# Patient Record
Sex: Female | Born: 1965
Health system: Southern US, Community
[De-identification: ages and names within clinical notes are randomized; demographics above are authoritative.]

## PROBLEM LIST (undated history)

## (undated) DIAGNOSIS — I428 Other cardiomyopathies: Secondary | ICD-10-CM

## (undated) DIAGNOSIS — E785 Hyperlipidemia, unspecified: Secondary | ICD-10-CM

## (undated) DIAGNOSIS — E1142 Type 2 diabetes mellitus with diabetic polyneuropathy: Secondary | ICD-10-CM

## (undated) DIAGNOSIS — I509 Heart failure, unspecified: Secondary | ICD-10-CM

## (undated) DIAGNOSIS — R011 Cardiac murmur, unspecified: Secondary | ICD-10-CM

## (undated) DIAGNOSIS — E119 Type 2 diabetes mellitus without complications: Secondary | ICD-10-CM

## (undated) DIAGNOSIS — I5041 Acute combined systolic (congestive) and diastolic (congestive) heart failure: Secondary | ICD-10-CM

## (undated) DIAGNOSIS — H269 Unspecified cataract: Secondary | ICD-10-CM

## (undated) DIAGNOSIS — I10 Essential (primary) hypertension: Secondary | ICD-10-CM

## (undated) HISTORY — DX: Type 2 diabetes mellitus with diabetic polyneuropathy: E11.42

## (undated) HISTORY — PX: WISDOM TOOTH EXTRACTION: SHX21

## (undated) HISTORY — DX: Cardiac murmur, unspecified: R01.1

## (undated) HISTORY — DX: Unspecified cataract: H26.9

## (undated) HISTORY — DX: Hyperlipidemia, unspecified: E78.5

## (undated) HISTORY — PX: PARTIAL HYSTERECTOMY: SHX80

## (undated) HISTORY — PX: UTERINE FIBROID SURGERY: SHX826

## (undated) HISTORY — PX: BREAST BIOPSY: SHX20

## (undated) HISTORY — PX: ABDOMINAL HYSTERECTOMY: SHX81

---

## 2007-09-29 ENCOUNTER — Emergency Department (HOSPITAL_COMMUNITY): Admission: EM | Admit: 2007-09-29 | Discharge: 2007-09-29 | Payer: Self-pay | Admitting: Emergency Medicine

## 2008-01-30 ENCOUNTER — Emergency Department (HOSPITAL_COMMUNITY): Admission: EM | Admit: 2008-01-30 | Discharge: 2008-01-30 | Payer: Self-pay | Admitting: Emergency Medicine

## 2008-02-05 ENCOUNTER — Emergency Department (HOSPITAL_COMMUNITY): Admission: EM | Admit: 2008-02-05 | Discharge: 2008-02-05 | Payer: Self-pay | Admitting: Emergency Medicine

## 2011-02-07 ENCOUNTER — Emergency Department (HOSPITAL_COMMUNITY): Payer: Self-pay

## 2011-02-07 ENCOUNTER — Emergency Department (HOSPITAL_COMMUNITY)
Admission: EM | Admit: 2011-02-07 | Discharge: 2011-02-07 | Disposition: A | Discharge status: Home / Self Care | Payer: Self-pay | Attending: Emergency Medicine | Admitting: Emergency Medicine

## 2011-02-07 DIAGNOSIS — M51379 Other intervertebral disc degeneration, lumbosacral region without mention of lumbar back pain or lower extremity pain: Secondary | ICD-10-CM | POA: Insufficient documentation

## 2011-02-07 DIAGNOSIS — M545 Low back pain, unspecified: Secondary | ICD-10-CM | POA: Insufficient documentation

## 2011-02-07 DIAGNOSIS — M79609 Pain in unspecified limb: Secondary | ICD-10-CM | POA: Insufficient documentation

## 2011-02-07 DIAGNOSIS — M5137 Other intervertebral disc degeneration, lumbosacral region: Secondary | ICD-10-CM | POA: Insufficient documentation

## 2011-02-07 DIAGNOSIS — M5430 Sciatica, unspecified side: Secondary | ICD-10-CM | POA: Insufficient documentation

## 2011-04-18 LAB — URINALYSIS, ROUTINE W REFLEX MICROSCOPIC
Hgb urine dipstick: NEGATIVE
Nitrite: NEGATIVE
Protein, ur: NEGATIVE
Urobilinogen, UA: 0.2

## 2011-04-18 LAB — PREGNANCY, URINE: Preg Test, Ur: NEGATIVE

## 2012-05-26 DIAGNOSIS — Z09 Encounter for follow-up examination after completed treatment for conditions other than malignant neoplasm: Secondary | ICD-10-CM | POA: Insufficient documentation

## 2012-05-27 ENCOUNTER — Emergency Department (HOSPITAL_COMMUNITY)
Admission: EM | Admit: 2012-05-27 | Discharge: 2012-05-27 | Discharge status: Against Medical Advice | Payer: Self-pay | Attending: Emergency Medicine | Admitting: Emergency Medicine

## 2012-08-01 ENCOUNTER — Emergency Department (HOSPITAL_COMMUNITY)
Admission: EM | Admit: 2012-08-01 | Discharge: 2012-08-02 | Disposition: A | Discharge status: Home / Self Care | Payer: Self-pay | Attending: Emergency Medicine | Admitting: Emergency Medicine

## 2012-08-01 ENCOUNTER — Emergency Department (HOSPITAL_COMMUNITY): Payer: Self-pay

## 2012-08-01 ENCOUNTER — Encounter (HOSPITAL_COMMUNITY): Payer: Self-pay | Admitting: *Deleted

## 2012-08-01 DIAGNOSIS — I10 Essential (primary) hypertension: Secondary | ICD-10-CM | POA: Insufficient documentation

## 2012-08-01 DIAGNOSIS — M7989 Other specified soft tissue disorders: Secondary | ICD-10-CM | POA: Insufficient documentation

## 2012-08-01 DIAGNOSIS — Z7982 Long term (current) use of aspirin: Secondary | ICD-10-CM | POA: Insufficient documentation

## 2012-08-01 DIAGNOSIS — R0789 Other chest pain: Secondary | ICD-10-CM | POA: Insufficient documentation

## 2012-08-01 HISTORY — DX: Essential (primary) hypertension: I10

## 2012-08-01 LAB — CBC
Hemoglobin: 12.6 g/dL (ref 12.0–15.0)
MCH: 28.6 pg (ref 26.0–34.0)
MCHC: 33.2 g/dL (ref 30.0–36.0)
RDW: 12.7 % (ref 11.5–15.5)

## 2012-08-01 LAB — COMPREHENSIVE METABOLIC PANEL
BUN: 14 mg/dL (ref 6–23)
Calcium: 9.4 mg/dL (ref 8.4–10.5)
GFR calc Af Amer: >90 mL/min (ref >90)
Glucose, Bld: 88 mg/dL (ref 70–99)
Sodium: 134 mEq/L — ABNORMAL LOW (ref 135–145)
Total Protein: 7.6 g/dL (ref 6.0–8.3)

## 2012-08-01 LAB — D-DIMER, QUANTITATIVE: D-Dimer, Quant: 0.5 ug/mL-FEU — ABNORMAL HIGH (ref 0.00–0.48)

## 2012-08-01 LAB — POCT I-STAT TROPONIN I

## 2012-08-01 MED ORDER — SODIUM CHLORIDE 0.9 % IV SOLN
1000.0000 mL | INTRAVENOUS | Status: DC
  Administered 2012-08-01: 1000 mL via INTRAVENOUS

## 2012-08-01 MED ORDER — ONDANSETRON HCL 4 MG/2ML IJ SOLN
4.0000 mg | Freq: Once | INTRAMUSCULAR | Status: AC
  Administered 2012-08-01: 4 mg via INTRAVENOUS
  Filled 2012-08-01: qty 2

## 2012-08-01 MED ORDER — NAPROXEN 500 MG PO TABS: 500.0000 mg | ORAL_TABLET | Freq: Two times a day (BID) | ORAL | Status: DC

## 2012-08-01 MED ORDER — MORPHINE SULFATE 4 MG/ML IJ SOLN
4.0000 mg | Freq: Once | INTRAMUSCULAR | Status: AC
  Administered 2012-08-01: 4 mg via INTRAVENOUS
  Filled 2012-08-01: qty 1

## 2012-08-01 MED ORDER — HYDROCODONE-ACETAMINOPHEN 5-325 MG PO TABS: 1.0000 | ORAL_TABLET | Freq: Four times a day (QID) | ORAL | Status: DC | PRN

## 2012-08-01 NOTE — ED Notes (Signed)
MD at bedside. 

## 2012-08-01 NOTE — ED Provider Notes (Signed)
History    CSN: [id number] Arrival date & time 08/01/12  1740 First MD Initiated Contact with Patient 08/01/12 2047      Chief Complaint  Patient presents with  . Chest Pain  . Leg Swelling    HPI Comments: It started a couple of nights ago.     Patient is a 47 y.o. female presenting with chest pain. The history is provided by the patient.  Chest Pain The chest pain began 2 days ago. Duration of episode(s) is 5 hours. Chest pain occurs constantly. Progression since onset: waxing and waning. The severity of the pain is moderate. The quality of the pain is described as pressure-like and sharp. The pain does not radiate. Chest pain is worsened by deep breathing. Pertinent negatives for primary symptoms include no fever, no cough, no wheezing, no nausea, no vomiting and no dizziness.  Pertinent negatives for associated symptoms include no near-syncope, no numbness and no weakness. She tried nothing for the symptoms. There are no known risk factors.  Her past medical history is significant for hypertension (but does not need to take medications).    patient states she can exert herself and walk without any discomfort She has also had leg pain but that started about 6-7 months ago.  She got it checked in the ED once but did not investigate it further.  She thinks she has noticed some swelling more recently in both legs.  Her light leg feels like it is burning inside but they both have some pain. Past Medical History  Diagnosis Date  . Hypertension     History reviewed. No pertinent past surgical history.  History reviewed. No pertinent family history.  History  Substance Use Topics  . Smoking status: Not on file  . Smokeless tobacco: Not on file  . Alcohol Use: No    OB History    Grav Para Term Preterm Abortions TAB SAB Ect Mult Living                  Review of Systems  Constitutional: Negative for fever.  Respiratory: Negative for cough and wheezing.   Cardiovascular:  Positive for chest pain. Negative for near-syncope.  Gastrointestinal: Negative for nausea and vomiting.  Neurological: Negative for dizziness, weakness and numbness.  All other systems reviewed and are negative.    Allergies  Review of patient's allergies indicates no known allergies.  Home Medications   Current Outpatient Rx  Name  Route  Sig  Dispense  Refill  . ASPIRIN 81 MG PO TABS   Oral   Take 81 mg by mouth daily.         Marland Kitchen NAPROXEN SODIUM 220 MG PO TABS   Oral   Take 220 mg by mouth as needed. Foot pain           BP 144/85  Pulse 84  Temp 98.3 F (36.8 C) (Oral)  Resp 17  SpO2 100%  Physical Exam  Nursing note and vitals reviewed. Constitutional: She appears well-developed and well-nourished. No distress.  HENT:  Head: Normocephalic and atraumatic.  Right Ear: External ear normal.  Left Ear: External ear normal.  Eyes: Conjunctivae normal are normal. Right eye exhibits no discharge. Left eye exhibits no discharge. No scleral icterus.  Neck: Neck supple. No tracheal deviation present.  Cardiovascular: Normal rate, regular rhythm and intact distal pulses.   Pulmonary/Chest: Effort normal and breath sounds normal. No stridor. No respiratory distress. She has no wheezes. She has no rales.  Abdominal:  Soft. Bowel sounds are normal. She exhibits no distension. There is no tenderness. There is no rebound and no guarding.  Musculoskeletal: She exhibits tenderness. She exhibits no edema.       The patient has mild tenderness in the right ankle but there is no swelling in her calf or foot  Neurological: She is alert. She has normal strength. No sensory deficit. Cranial nerve deficit:  no gross defecits noted. She exhibits normal muscle tone. She displays no seizure activity. Coordination normal.  Skin: Skin is warm and dry. No rash noted.  Psychiatric: She has a normal mood and affect.    ED Course  Procedures (including critical care time) EKG Rate 80  NSR    Nl interval Nl st t waves Labs Reviewed  COMPREHENSIVE METABOLIC PANEL - Abnormal; Notable for the following:    Sodium 134 (*)     All other components within normal limits  D-DIMER, QUANTITATIVE - Abnormal; Notable for the following:    D-Dimer, Quant 0.50 (*)     All other components within normal limits  CBC  POCT I-STAT TROPONIN I   Dg Chest 2 View  08/01/2012  *RADIOLOGY REPORT*  Clinical Data: Chest pain and cough with leg swelling  CHEST - 2 VIEW  Comparison: 02/05/2008.  Findings: Borderline cardiac enlargement.  Clear lung fields.  No effusion or pneumothorax.  Bones unremarkable.  No mediastinal or hilar fullness.  IMPRESSION: Cardiac enlargement.  No active infiltrates.   Original Report Authenticated By: Davonna Belling, M.D.      1. Chest pain, atypical       MDM  Patient is low risk for ACS. Symptoms are atypical for coronary disease. I have low suspicion for ACS. Patient has no risk factors for pulmonary embolism. Her d-dimer is reassuring. I doubt PE.  At this time there does not appear to be any evidence of an acute emergency medical condition and the patient appears stable for discharge with appropriate outpatient follow up.         Celene Kras, MD 08/01/12 [phone]

## 2012-08-01 NOTE — ED Notes (Addendum)
Pt c/o chest pain stabbing in nature that started last night. Reports pain is worse with deep breath and also reports numbess to left hand. Pt also concerned with bilateral leg swelling and pain. Reports frequent visits for same.

## 2012-08-02 MED ORDER — HYDROCODONE-ACETAMINOPHEN 5-325 MG PO TABS
1.0000 | ORAL_TABLET | Freq: Once | ORAL | Status: AC
  Administered 2012-08-02: 1 via ORAL
  Filled 2012-08-02: qty 1

## 2013-02-04 ENCOUNTER — Emergency Department (INDEPENDENT_AMBULATORY_CARE_PROVIDER_SITE_OTHER)
Admission: EM | Admit: 2013-02-04 | Discharge: 2013-02-04 | Disposition: A | Discharge status: Home / Self Care | Payer: Self-pay | Source: Home / Self Care

## 2013-02-04 DIAGNOSIS — K148 Other diseases of tongue: Secondary | ICD-10-CM

## 2013-02-04 DIAGNOSIS — S025XXA Fracture of tooth (traumatic), initial encounter for closed fracture: Secondary | ICD-10-CM

## 2013-02-04 MED ORDER — AMOXICILLIN-POT CLAVULANATE 875-125 MG PO TABS: 1.0000 | ORAL_TABLET | Freq: Two times a day (BID) | ORAL | Status: DC

## 2013-02-04 MED ORDER — TRAMADOL HCL 50 MG PO TABS: 50.0000 mg | ORAL_TABLET | Freq: Four times a day (QID) | ORAL | Status: DC | PRN

## 2013-02-04 NOTE — ED Notes (Signed)
Patient states four days ago, she bit her tongue eating a pashcio and thought that her tongue got infected.  Patient states two days later a knot came up inside her throat.

## 2013-02-04 NOTE — ED Provider Notes (Signed)
Maria Duncan is a 47 y.o. female who presents to Urgent Care today for left-sided tongue irritation and tooth fracture. Patient bit her tongue 4 days ago trying to buy to pistachio. She did hard enough that she broke her Maller on her left side. Since then she's had pain in her tongue where she bit her tongue as well as pain and swelling into her left neck. She denies any fever or trouble swallowing or breathing. She feels well otherwise. She has never done this before.    PMH reviewed. Healthy otherwise History  Substance Use Topics  . Smoking status: Not on file  . Smokeless tobacco: Not on file  . Alcohol Use: No   ROS as above Medications reviewed. No current facility-administered medications for this encounter.   Current Outpatient Prescriptions  Medication Sig Dispense Refill  . amoxicillin-clavulanate (AUGMENTIN) 875-125 MG per tablet Take 1 tablet by mouth 2 (two) times daily.  60 tablet  0  . aspirin 81 MG tablet Take 81 mg by mouth daily.      . naproxen (NAPROSYN) 500 MG tablet Take 1 tablet (500 mg total) by mouth 2 (two) times daily.  30 tablet  0  . traMADol (ULTRAM) 50 MG tablet Take 1 tablet (50 mg total) by mouth every 6 (six) hours as needed for pain.  30 tablet  0    Exam:  BP 150/95  Pulse 81  Temp(Src) 99.1 F (37.3 C) (Oral)  Resp 18  SpO2 97% Gen: Well NAD HEENT: EOMI,  MMM. Broken molar left mandible. Ulcer on left tongue.  Mild tender anterior cervical lymphadenopathy on the left side.  No abscess present.    No results found for this or any previous visit (from the past 24 hour(s)). No results found.  Assessment and Plan: 47 y.o. female with fractured tooth with wound on tongue.  Possible infection.  Plan to treat with Augmentin.  Pain control with tramadol Dental wax for broken tooth Followup with dentist.  Discussed warning signs or symptoms. Please see discharge instructions. Patient expresses understanding.      Rodolph Bong,  MD 02/04/13 2028

## 2013-02-06 LAB — CULTURE, GROUP A STREP

## 2013-03-17 ENCOUNTER — Emergency Department (HOSPITAL_COMMUNITY): Payer: Self-pay

## 2013-03-17 ENCOUNTER — Encounter (HOSPITAL_COMMUNITY): Payer: Self-pay | Admitting: Emergency Medicine

## 2013-03-17 ENCOUNTER — Emergency Department (HOSPITAL_COMMUNITY)
Admission: EM | Admit: 2013-03-17 | Discharge: 2013-03-17 | Disposition: A | Discharge status: Home / Self Care | Payer: Self-pay | Attending: Emergency Medicine | Admitting: Emergency Medicine

## 2013-03-17 DIAGNOSIS — Z7982 Long term (current) use of aspirin: Secondary | ICD-10-CM | POA: Insufficient documentation

## 2013-03-17 DIAGNOSIS — R0602 Shortness of breath: Secondary | ICD-10-CM

## 2013-03-17 DIAGNOSIS — R0789 Other chest pain: Secondary | ICD-10-CM | POA: Insufficient documentation

## 2013-03-17 DIAGNOSIS — R109 Unspecified abdominal pain: Secondary | ICD-10-CM | POA: Insufficient documentation

## 2013-03-17 DIAGNOSIS — G8929 Other chronic pain: Secondary | ICD-10-CM | POA: Insufficient documentation

## 2013-03-17 DIAGNOSIS — I10 Essential (primary) hypertension: Secondary | ICD-10-CM | POA: Insufficient documentation

## 2013-03-17 DIAGNOSIS — M549 Dorsalgia, unspecified: Secondary | ICD-10-CM | POA: Insufficient documentation

## 2013-03-17 DIAGNOSIS — R079 Chest pain, unspecified: Secondary | ICD-10-CM

## 2013-03-17 LAB — URINALYSIS, ROUTINE W REFLEX MICROSCOPIC
Bilirubin Urine: NEGATIVE
Nitrite: NEGATIVE
Protein, ur: NEGATIVE mg/dL
Specific Gravity, Urine: 1.025 (ref 1.005–1.030)
Urobilinogen, UA: 0.2 mg/dL (ref 0.0–1.0)

## 2013-03-17 LAB — POCT I-STAT TROPONIN I: Troponin i, poc: 0 ng/mL (ref 0.00–0.08)

## 2013-03-17 LAB — BASIC METABOLIC PANEL
CO2: 29 mEq/L (ref 19–32)
Glucose, Bld: 93 mg/dL (ref 70–99)
Potassium: 4 mEq/L (ref 3.5–5.1)
Sodium: 138 mEq/L (ref 135–145)

## 2013-03-17 LAB — HEPATIC FUNCTION PANEL
ALT: 11 U/L (ref 0–35)
AST: 16 U/L (ref 0–37)
Alkaline Phosphatase: 80 U/L (ref 39–117)
Bilirubin, Direct: 0.1 mg/dL (ref 0.0–0.3)
Indirect Bilirubin: 0.6 mg/dL (ref 0.3–0.9)

## 2013-03-17 LAB — CBC
Hemoglobin: 11.9 g/dL — ABNORMAL LOW (ref 12.0–15.0)
RBC: 4.22 MIL/uL (ref 3.87–5.11)
WBC: 6.7 10*3/uL (ref 4.0–10.5)

## 2013-03-17 LAB — PRO B NATRIURETIC PEPTIDE: Pro B Natriuretic peptide (BNP): 150.6 pg/mL — ABNORMAL HIGH (ref 0–125)

## 2013-03-17 MED ORDER — MORPHINE SULFATE 4 MG/ML IJ SOLN
4.0000 mg | INTRAMUSCULAR | Status: AC
  Administered 2013-03-17: 4 mg via INTRAVENOUS
  Filled 2013-03-17: qty 1

## 2013-03-17 MED ORDER — IOHEXOL 350 MG/ML SOLN
100.0000 mL | Freq: Once | INTRAVENOUS | Status: AC | PRN
  Administered 2013-03-17: 100 mL via INTRAVENOUS

## 2013-03-17 NOTE — ED Provider Notes (Signed)
CSN: [id number]     Arrival date & time 03/17/13  1257 History     First MD Initiated Contact with Patient 03/17/13 1502     Chief Complaint  Patient presents with  . Chest Pain  . Back Pain  . Shortness of Breath   (Consider location/radiation/quality/duration/timing/severity/associated sxs/prior Treatment) Patient is a 47 y.o. female presenting with chest pain, back pain, and shortness of breath. The history is provided by the patient.  Chest Pain Pain location:  L chest Pain quality: sharp and tightness   Pain radiates to:  Neck and upper back Pain radiates to the back: yes   Pain severity:  Moderate Onset quality:  Gradual Duration:  3 days Timing: intermittent, now constant today. Chronicity:  New Context: breathing and movement   Relieved by:  Nothing Worsened by:  Nothing tried Ineffective treatments:  None tried Associated symptoms: abdominal pain (left sided for 1 year), back pain and shortness of breath   Associated symptoms: no cough, no dizziness, no fatigue, no fever, no headache, no nausea and not vomiting   Back Pain Associated symptoms: abdominal pain (left sided for 1 year) and chest pain   Associated symptoms: no dysuria, no fever and no headaches   Shortness of Breath Associated symptoms: abdominal pain (left sided for 1 year) and chest pain   Associated symptoms: no cough, no fever, no headaches, no neck pain and no vomiting     Past Medical History  Diagnosis Date  . Hypertension    No past surgical history on file. No family history on file. History  Substance Use Topics  . Smoking status: Not on file  . Smokeless tobacco: Not on file  . Alcohol Use: No   OB History   Grav Para Term Preterm Abortions TAB SAB Ect Mult Living                 Review of Systems  Constitutional: Negative for fever and fatigue.  HENT: Negative for congestion, drooling and neck pain.   Eyes: Negative for pain.  Respiratory: Positive for chest tightness and  shortness of breath. Negative for cough.   Cardiovascular: Positive for chest pain.  Gastrointestinal: Positive for abdominal pain (left sided for 1 year). Negative for nausea, vomiting and diarrhea.  Genitourinary: Negative for dysuria and hematuria.  Musculoskeletal: Positive for back pain. Negative for gait problem.  Skin: Negative for color change.  Neurological: Negative for dizziness and headaches.  Hematological: Negative for adenopathy.  Psychiatric/Behavioral: Negative for behavioral problems.  All other systems reviewed and are negative.    Allergies  Hyzaar  Home Medications   Current Outpatient Rx  Name  Route  Sig  Dispense  Refill  . aspirin 81 MG tablet   Oral   Take 81 mg by mouth daily.         . naproxen (NAPROSYN) 500 MG tablet   Oral   Take 1 tablet (500 mg total) by mouth 2 (two) times daily.   30 tablet   0   . traMADol (ULTRAM) 50 MG tablet   Oral   Take 1 tablet (50 mg total) by mouth every 6 (six) hours as needed for pain.   30 tablet   0    BP 136/67  Pulse 79  Temp(Src) 98.5 F (36.9 C) (Oral)  Resp 18  SpO2 100% Physical Exam  Nursing note and vitals reviewed. Constitutional: She is oriented to person, place, and time. She appears well-developed and well-nourished.  HENT:  Head:  Normocephalic.  Mouth/Throat: No oropharyngeal exudate.  Eyes: Conjunctivae and EOM are normal. Pupils are equal, round, and reactive to light.  Neck: Normal range of motion. Neck supple.  Cardiovascular: Normal rate, regular rhythm, normal heart sounds and intact distal pulses.  Exam reveals no gallop and no friction rub.   No murmur heard. Pulmonary/Chest: Effort normal and breath sounds normal. No respiratory distress. She has no wheezes. She exhibits no tenderness.  Abdominal: Soft. Bowel sounds are normal. There is no tenderness. There is no rebound and no guarding.  Musculoskeletal: Normal range of motion. She exhibits no edema and no tenderness.   Normal appearing and symmetrical LE's.   2+ distal pulses.   Neurological: She is alert and oriented to person, place, and time.  Skin: Skin is warm and dry.  Psychiatric: She has a normal mood and affect. Her behavior is normal.    ED Course   Procedures (including critical care time)  Labs Reviewed  CBC - Abnormal; Notable for the following:    Hemoglobin 11.9 (*)    All other components within normal limits  BASIC METABOLIC PANEL - Abnormal; Notable for the following:    GFR calc non Af Amer 81 (*)    All other components within normal limits  PRO B NATRIURETIC PEPTIDE - Abnormal; Notable for the following:    Pro B Natriuretic peptide (BNP) 150.6 (*)    All other components within normal limits  URINALYSIS, ROUTINE W REFLEX MICROSCOPIC - Abnormal; Notable for the following:    APPearance CLOUDY (*)    All other components within normal limits  HEPATIC FUNCTION PANEL - Abnormal; Notable for the following:    Albumin 3.3 (*)    All other components within normal limits  POCT I-STAT TROPONIN I   Dg Chest 2 View  03/17/2013   *RADIOLOGY REPORT*  Clinical Data: Left-sided chest pain.  Current history of hypertension.  CHEST - 2 VIEW  Comparison: CTA chest performed earlier same date.  Two-view chest x-ray 08/01/2012, 02/05/1008.  Findings: Cardiac silhouette upper normal in size, unchanged. Hilar and mediastinal contours otherwise unremarkable.  Lungs clear.  Bronchovascular markings normal.  Pulmonary vascularity normal.  No pneumothorax.  No pleural effusions.  Degenerative changes involving the thoracic spine.  No significant interval change.  IMPRESSION: No acute cardiopulmonary disease.  Stable examination.   Original Report Authenticated By: Hulan Saas, M.D.   Ct Angio Chest Aortic Dissect W &/or W/o  03/17/2013   *RADIOLOGY REPORT*  Clinical Data:  Intermittent chest pain since last night, progressively worsening, radiating to the back and into the pelvis. Shortness of  breath.  CT ANGIOGRAPHY CHEST, ABDOMEN AND PELVIS  Technique:  Multidetector CT imaging through the chest, abdomen and pelvis was performed using the standard protocol during bolus administration of intravenous contrast.  Multiplanar reconstructed images and MIPs were obtained and reviewed to evaluate the vascular anatomy.  Contrast: OMNIPAQUE IOHEXOL 350 MG/ML IV.  Comparison:   None.  CTA CHEST  Findings:  Unenhanced images demonstrates no mural hematoma.  No atherosclerotic calcification involving the thoracic aorta. Enhanced images demonstrate no evidence of thoracic aortic aneurysm or dissection.  Bovine aortic arch anatomy (left common carotid artery arises from the innominate artery).  No evidence of central pulmonary embolism.  Heart size normal.  No visible coronary atherosclerosis.  No pericardial effusion.  Pulmonary parenchyma clear apart from the expected dependent atelectasis posteriorly in the lower lobes.  No confluent airspace consolidation.  No evidence of interstitial lung disease.  No pulmonary parenchymal nodules or masses.  No pleural effusions.  No significant mediastinal, hilar, or axillary lymphadenopathy. Thyroid gland normal in appearance.  Bone window images demonstrate diffuse thoracic spondylosis.  Review of the MIP images confirms the above findings.  IMPRESSION:  1.  No evidence of thoracic aortic aneurysm or dissection. 2.  No acute cardiopulmonary disease.  CTA ABDOMEN AND PELVIS  Findings:  No evidence of abdominal aortic aneurysm or dissection. Calcified plaque in the right common iliac artery and both internal iliac arteries without stenosis.  Widely patent celiac, SMA, and IMA, though the IMA is diminutive.  Widely patent single right renal artery and 2 adjacent left renal arteries.  Normal appearing liver, spleen, pancreas, adrenal glands, kidneys, and gallbladder.  No biliary ductal dilation.  No significant lymphadenopathy.  Elevation of the left hemidiaphragm.  Small  hiatal hernia.  Stomach otherwise unremarkable.  Normal-appearing small bowel.  Moderate stool burden throughout the normal appearing colon.  Normal appearing long appendix in the right mid pelvis, extending to the left of midline.  No ascites.  Small umbilical hernia containing fat.  Urinary bladder decompressed and unremarkable.  Normal-appearing retroverted uterus.  Ovaries normal by CT, containing small cysts. Numerous pelvic phleboliths.  Bone window images demonstrate bilateral L5 pars defects and grade I to II spondylolisthesis of L5 on S1 approximating 10 mm. 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  No new  Review of the MIP images confirms the above findings.  IMPRESSION:  1.  No evidence of abdominal aortic aneurysm or dissection. 2.  Mild iliac atherosclerosis, somewhat advanced for age. 3.  No acute abnormalities involving the abdomen or pelvis. 4.  Small hiatal hernia. 5.  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 6.  Small umbilical hernia containing fat.   Original Report Authenticated By: Hulan Saas, M.D.   Ct Angio Abd/pel W/ And/or W/o  03/17/2013   *RADIOLOGY REPORT*  Clinical Data:  Intermittent chest pain since last night, progressively worsening, radiating to the back and into the pelvis. Shortness of breath.  CT ANGIOGRAPHY CHEST, ABDOMEN AND PELVIS  Technique:  Multidetector CT imaging through the chest, abdomen and pelvis was performed using the standard protocol during bolus administration of intravenous contrast.  Multiplanar reconstructed images and MIPs were obtained and reviewed to evaluate the vascular anatomy.  Contrast: OMNIPAQUE IOHEXOL 350 MG/ML IV.  Comparison:   None.  CTA CHEST  Findings:  Unenhanced images demonstrates no mural hematoma.  No atherosclerotic calcification involving the thoracic aorta. Enhanced images demonstrate no evidence of thoracic aortic aneurysm or dissection.  Bovine  aortic arch anatomy (left common carotid artery arises from the innominate artery).  No evidence of central pulmonary embolism.  Heart size normal.  No visible coronary atherosclerosis.  No pericardial effusion.  Pulmonary parenchyma clear apart from the expected dependent atelectasis posteriorly in the lower lobes.  No confluent airspace consolidation.  No evidence of interstitial lung disease.  No pulmonary parenchymal nodules or masses.  No pleural effusions.  No significant mediastinal, hilar, or axillary lymphadenopathy. Thyroid gland normal in appearance.  Bone window images demonstrate diffuse thoracic spondylosis.  Review of the MIP images confirms the above findings.  IMPRESSION:  1.  No evidence of thoracic aortic aneurysm or dissection. 2.  No acute cardiopulmonary disease.  CTA ABDOMEN AND PELVIS  Findings:  No evidence of abdominal aortic aneurysm or dissection. Calcified plaque in the right common iliac artery and  both internal iliac arteries without stenosis.  Widely patent celiac, SMA, and IMA, though the IMA is diminutive.  Widely patent single right renal artery and 2 adjacent left renal arteries.  Normal appearing liver, spleen, pancreas, adrenal glands, kidneys, and gallbladder.  No biliary ductal dilation.  No significant lymphadenopathy.  Elevation of the left hemidiaphragm.  Small hiatal hernia.  Stomach otherwise unremarkable.  Normal-appearing small bowel.  Moderate stool burden throughout the normal appearing colon.  Normal appearing long appendix in the right mid pelvis, extending to the left of midline.  No ascites.  Small umbilical hernia containing fat.  Urinary bladder decompressed and unremarkable.  Normal-appearing retroverted uterus.  Ovaries normal by CT, containing small cysts. Numerous pelvic phleboliths.  Bone window images demonstrate bilateral L5 pars defects and grade I to II spondylolisthesis of L5 on S1 approximating 10 mm. Degenerative disc disease and spondylosis at  L5-S1.  No new  Review of the MIP images confirms the above findings.  IMPRESSION:  1.  No evidence of abdominal aortic aneurysm or dissection. 2.  Mild iliac atherosclerosis, somewhat advanced for age. 3.  No acute abnormalities involving the abdomen or pelvis. 4.  Small hiatal hernia. 5.  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 6.  Small umbilical hernia containing fat.   Original Report Authenticated By: Hulan Saas, M.D.   1. Chest pain   2. SOB (shortness of breath)      Date: 03/17/2013  Rate: 78  Rhythm: normal sinus rhythm  QRS Axis: normal  Intervals: normal  ST/T Wave abnormalities: normal  Conduction Disutrbances:none  Narrative Interpretation: Mild artifact, No ST or T wave changes consistent with ischemia.   Old EKG Reviewed: unchanged   MDM  3:20 PM 47 y.o. female w hx of chronic left sided abd pain pw chest pain x 3 days, now w/ sob. Pt AFVSS here. Notes pain radiates to back, has had numbness/tingling in bilateral UE's also pain radiating to neck. No bruits heard. Well's negative. Will get labs, ecg, CTA to r/o dissection.   Pt is low risk per heart score. Pain is atypical. Sx have been constant since 7am today w/ neg troponin here. Now cp gone after morphine x 1. CTA CAP neg for AD, no evidence of PE noted although not a PE scan. I offered admission. Pt would like to go home. As pt is feeling better I think it is reasonable to d/c home w/ pcp f/u. Doubt ACS, Doubt PE.   9:13 PM:  I have discussed the diagnosis/risks/treatment options with the patient and believe the pt to be eligible for discharge home to follow-up and establish with a pcp for further evaluation of her chronic abd pain. We also discussed returning to the ED immediately if new or worsening sx occur. We discussed the sx which are most concerning (e.g., continued or worsening cp, sob, fever) that necessitate immediate return. Any new prescriptions  provided to the patient are listed below.  Discharge Medication List as of 03/17/2013  5:52 PM         Randa Spike Mort Sawyers, MD 03/17/13 2115

## 2013-03-17 NOTE — ED Notes (Signed)
Pt states that she started having chest pain on and off since last night but states it is getting worse, radiating to her back, and is SOB.

## 2013-03-17 NOTE — ED Notes (Signed)
Patient transported to X-ray 

## 2014-03-02 ENCOUNTER — Ambulatory Visit (INDEPENDENT_AMBULATORY_CARE_PROVIDER_SITE_OTHER): Payer: Self-pay | Admitting: Emergency Medicine

## 2014-03-02 VITALS — BP 116/80 | HR 87 | Temp 98.4°F | Resp 16 | Ht 64.5 in | Wt 233.5 lb

## 2014-03-02 DIAGNOSIS — R609 Edema, unspecified: Secondary | ICD-10-CM

## 2014-03-02 LAB — POCT UA - MICROSCOPIC ONLY
Bacteria, U Microscopic: NEGATIVE
Casts, Ur, LPF, POC: NEGATIVE
Crystals, Ur, HPF, POC: NEGATIVE
Mucus, UA: NEGATIVE
RBC, URINE, MICROSCOPIC: NEGATIVE
WBC, UR, HPF, POC: NEGATIVE
Yeast, UA: NEGATIVE

## 2014-03-02 LAB — POCT URINALYSIS DIPSTICK
BILIRUBIN UA: NEGATIVE
Blood, UA: NEGATIVE
GLUCOSE UA: NEGATIVE
KETONES UA: NEGATIVE
LEUKOCYTES UA: NEGATIVE
Nitrite, UA: NEGATIVE
PH UA: 7.5
Spec Grav, UA: 1.015
Urobilinogen, UA: 1

## 2014-03-02 LAB — GLUCOSE, POCT (MANUAL RESULT ENTRY): POC GLUCOSE: 75 mg/dL (ref 70–99)

## 2014-03-02 MED ORDER — PSEUDOEPHEDRINE-GUAIFENESIN ER 60-600 MG PO TB12: 1.0000 | ORAL_TABLET | Freq: Two times a day (BID) | ORAL | Status: AC

## 2014-03-02 MED ORDER — TRAMADOL HCL 50 MG PO TABS: 50.0000 mg | ORAL_TABLET | Freq: Four times a day (QID) | ORAL | Status: DC | PRN

## 2014-03-02 MED ORDER — AMOXICILLIN-POT CLAVULANATE 875-125 MG PO TABS: 1.0000 | ORAL_TABLET | Freq: Two times a day (BID) | ORAL | Status: DC

## 2014-03-02 NOTE — Progress Notes (Signed)
[facility] [address], [place] [id number] [phone]  Date:  03/02/2014   Name:  Maria Duncan   DOB:  10/28/1965   MRN:  [id number]  PCP:  No PCP Per Patient    Chief Complaint: URI, Flank Pain and Leg Swelling   History of Present Illness:  Maria Duncan is a 48 y.o. very pleasant female patient who presents with the following:  Multiple complaints.   Has swelling in legs.  Worse when stands up all day.  No pain in legs.  Has green sputum and nasal discharge.  No wheezing or shortness of breath.  No nausea or vomiting.   No fever or chills Right conjunctival drainage and gluing Pain in left flank for months that interferes with laying on that side.  No dysuria, urgency or frequency.   No improvement with over the counter medications or other home remedies.  Denies other complaint or health concern today.   There are no active problems to display for this patient.   Past Medical History  Diagnosis Date  . Hypertension     History reviewed. No pertinent past surgical history.  History  Substance Use Topics  . Smoking status: Never Smoker   . Smokeless tobacco: Never Used  . Alcohol Use: No    Family History  Problem Relation Age of Onset  . Cancer Mother   . Hyperlipidemia Mother   . Diabetes Father   . Hypertension Father   . Hyperlipidemia Sister   . Hypertension Sister   . Hyperlipidemia Brother   . Hypertension Brother   . Cancer Maternal Grandmother   . Stroke Maternal Grandmother   . Cancer Maternal Grandfather   . Diabetes Maternal Grandfather   . Heart disease Maternal Grandfather   . Hypertension Maternal Grandfather   . Diabetes Paternal Grandmother   . Hypertension Paternal Grandmother   . Cancer Paternal Grandfather   . Hypertension Paternal Grandfather   . Stroke Paternal Grandfather     Allergies  Allergen Reactions  . Hyzaar [Losartan Potassium-Hctz] Nausea And Vomiting and Cough    Medication list has been  reviewed and updated.  Current Outpatient Prescriptions on File Prior to Visit  Medication Sig Dispense Refill  . aspirin 81 MG tablet Take 81 mg by mouth daily.      . naproxen (NAPROSYN) 500 MG tablet Take 1 tablet (500 mg total) by mouth 2 (two) times daily.  30 tablet  0  . traMADol (ULTRAM) 50 MG tablet Take 1 tablet (50 mg total) by mouth every 6 (six) hours as needed for pain.  30 tablet  0   No current facility-administered medications on file prior to visit.    Review of Systems:  As per HPI, otherwise negative.    Physical Examination: Filed Vitals:   03/02/14 1506  BP: 116/80  Pulse: 87  Temp: 98.4 F (36.9 C)  Resp: 16   Filed Vitals:   03/02/14 1506  Height: 5' 4.5" (1.638 m)  Weight: 233 lb 8 oz (105.915 kg)   Body mass index is 39.48 kg/(m^2). Ideal Body Weight: Weight in (lb) to have BMI = 25: 147.6  GEN: obese, NAD, Non-toxic, A & O x 3  hoarse HEENT: Atraumatic, Normocephalic. Neck supple. No masses, No LAD.  Right conjunctival injection and drainage Ears and Nose: No external deformity. CV: RRR, No M/G/R. No JVD. No thrill. No extra heart sounds. PULM: CTA B, no wheezes, crackles, rhonchi. No retractions. No resp. distress. No  accessory muscle use. ABD: S, NT, ND, +BS. No rebound. No HSM. EXTR: No c/c less than 1+ edema distal calves NEURO Normal gait.  PSYCH: Normally interactive. Conversant. Not depressed or anxious appearing.  Calm demeanor.    Assessment and Plan: Sinusitis augmentin mucinex d Pink eye tobrex Flank pain Tramadol   Signed,  Phillips Odor, MD   Results for orders placed in visit on 03/02/14  POCT UA - MICROSCOPIC ONLY      Result Value Ref Range   WBC, Ur, HPF, POC neg     RBC, urine, microscopic neg     Bacteria, U Microscopic neg     Mucus, UA neg     Epithelial cells, urine per micros 1-5     Crystals, Ur, HPF, POC neg     Casts, Ur, LPF, POC neg     Yeast, UA neg    POCT URINALYSIS DIPSTICK      Result  Value Ref Range   Color, UA yellow     Clarity, UA clear     Glucose, UA neg     Bilirubin, UA neg     Ketones, UA neg     Spec Grav, UA 1.015     Blood, UA neg     pH, UA 7.5     Protein, UA trace     Urobilinogen, UA 1.0     Nitrite, UA neg     Leukocytes, UA Negative    GLUCOSE, POCT (MANUAL RESULT ENTRY)      Result Value Ref Range   POC Glucose 75  70 - 99 mg/dl

## 2014-03-02 NOTE — Patient Instructions (Signed)

## 2014-03-03 ENCOUNTER — Telehealth: Payer: Self-pay

## 2014-03-03 LAB — COMPREHENSIVE METABOLIC PANEL
ALBUMIN: 4.2 g/dL (ref 3.5–5.2)
ALT: 25 U/L (ref 0–35)
AST: 20 U/L (ref 0–37)
Alkaline Phosphatase: 96 U/L (ref 39–117)
BUN: 10 mg/dL (ref 6–23)
CALCIUM: 9.3 mg/dL (ref 8.4–10.5)
CHLORIDE: 101 meq/L (ref 96–112)
CO2: 30 meq/L (ref 19–32)
Creat: 0.85 mg/dL (ref 0.50–1.10)
Glucose, Bld: 88 mg/dL (ref 70–99)
POTASSIUM: 4.3 meq/L (ref 3.5–5.3)
SODIUM: 138 meq/L (ref 135–145)
TOTAL PROTEIN: 7.3 g/dL (ref 6.0–8.3)
Total Bilirubin: 0.5 mg/dL (ref 0.2–1.2)

## 2014-03-03 NOTE — Telephone Encounter (Signed)
Pt is needing to talk with you about pink eye and needing a different drops   Best number [phone]

## 2014-03-04 NOTE — Telephone Encounter (Signed)
Spoke to female in the home, office number given, states she will return call

## 2014-03-05 ENCOUNTER — Encounter: Payer: Self-pay | Admitting: Emergency Medicine

## 2014-03-05 NOTE — Telephone Encounter (Signed)
Tried to call again, no answer/no VM

## 2014-03-06 NOTE — Telephone Encounter (Signed)
Advised female to have pt RTC if still experiencing symptoms.

## 2016-06-09 ENCOUNTER — Emergency Department (HOSPITAL_BASED_OUTPATIENT_CLINIC_OR_DEPARTMENT_OTHER): Payer: Self-pay

## 2016-06-09 ENCOUNTER — Encounter (HOSPITAL_BASED_OUTPATIENT_CLINIC_OR_DEPARTMENT_OTHER): Payer: Self-pay | Admitting: Emergency Medicine

## 2016-06-09 ENCOUNTER — Emergency Department (HOSPITAL_BASED_OUTPATIENT_CLINIC_OR_DEPARTMENT_OTHER)
Admission: EM | Admit: 2016-06-09 | Discharge: 2016-06-10 | Disposition: A | Discharge status: Home / Self Care | Payer: Self-pay | Attending: Emergency Medicine | Admitting: Emergency Medicine

## 2016-06-09 DIAGNOSIS — Z7982 Long term (current) use of aspirin: Secondary | ICD-10-CM | POA: Insufficient documentation

## 2016-06-09 DIAGNOSIS — I10 Essential (primary) hypertension: Secondary | ICD-10-CM | POA: Insufficient documentation

## 2016-06-09 DIAGNOSIS — M2550 Pain in unspecified joint: Secondary | ICD-10-CM

## 2016-06-09 DIAGNOSIS — M940 Chondrocostal junction syndrome [Tietze]: Secondary | ICD-10-CM | POA: Insufficient documentation

## 2016-06-09 LAB — CBC WITH DIFFERENTIAL/PLATELET
BASOS ABS: 0 10*3/uL (ref 0.0–0.1)
Basophils Relative: 0 %
EOS PCT: 2 %
Eosinophils Absolute: 0.2 10*3/uL (ref 0.0–0.7)
HEMATOCRIT: 38.7 % (ref 36.0–46.0)
Hemoglobin: 12.3 g/dL (ref 12.0–15.0)
LYMPHS PCT: 39 %
Lymphs Abs: 3.1 10*3/uL (ref 0.7–4.0)
MCH: 28 pg (ref 26.0–34.0)
MCHC: 31.8 g/dL (ref 30.0–36.0)
MCV: 88 fL (ref 78.0–100.0)
MONO ABS: 0.7 10*3/uL (ref 0.1–1.0)
MONOS PCT: 8 %
NEUTROS ABS: 4.1 10*3/uL (ref 1.7–7.7)
Neutrophils Relative %: 51 %
PLATELETS: 238 10*3/uL (ref 150–400)
RBC: 4.4 MIL/uL (ref 3.87–5.11)
RDW: 13.1 % (ref 11.5–15.5)
WBC: 8 10*3/uL (ref 4.0–10.5)

## 2016-06-09 LAB — BASIC METABOLIC PANEL
Anion gap: 5 (ref 5–15)
BUN: 14 mg/dL (ref 6–20)
CO2: 32 mmol/L (ref 22–32)
Calcium: 9.6 mg/dL (ref 8.9–10.3)
Chloride: 102 mmol/L (ref 101–111)
Creatinine, Ser: 0.86 mg/dL (ref 0.44–1.00)
GFR calc Af Amer: >60 mL/min (ref >60)
GLUCOSE: 105 mg/dL — AB (ref 65–99)
POTASSIUM: 4.1 mmol/L (ref 3.5–5.1)
Sodium: 139 mmol/L (ref 135–145)

## 2016-06-09 LAB — PREGNANCY, URINE: PREG TEST UR: NEGATIVE

## 2016-06-09 LAB — CBG MONITORING, ED: GLUCOSE-CAPILLARY: 116 mg/dL — AB (ref 65–99)

## 2016-06-09 MED ORDER — IOPAMIDOL (ISOVUE-300) INJECTION 61%
80.0000 mL | Freq: Once | INTRAVENOUS | Status: AC | PRN
  Administered 2016-06-09: 100 mL via INTRAVENOUS

## 2016-06-09 NOTE — ED Triage Notes (Signed)
Patient states that she has a knot to her left upper quadrant area. The patient reports that she has had it for about a year. The patient reports that it is getting bigger and it hurts her back.
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
## 2016-06-10 MED ORDER — NAPROXEN 500 MG PO TABS: 500.0000 mg | ORAL_TABLET | Freq: Two times a day (BID) | ORAL | 0 refills | Status: DC | PRN

## 2016-06-10 NOTE — Discharge Instructions (Signed)
Please use number provided in this discharge paper to find a primary care provider for further management of your chronic aches and pain.  Take naproxen as needed for pain.  Return if you have any concerns.

## 2019-12-07 ENCOUNTER — Emergency Department (HOSPITAL_COMMUNITY): Payer: Self-pay

## 2019-12-07 ENCOUNTER — Encounter (HOSPITAL_COMMUNITY): Payer: Self-pay | Admitting: Emergency Medicine

## 2019-12-07 ENCOUNTER — Other Ambulatory Visit: Payer: Self-pay

## 2019-12-07 ENCOUNTER — Inpatient Hospital Stay (HOSPITAL_COMMUNITY)
Admission: EM | Admit: 2019-12-07 | Discharge: 2019-12-12 | DRG: 287 | Disposition: A | Discharge status: Home Health | Payer: Self-pay | Attending: Cardiovascular Disease | Admitting: Cardiovascular Disease

## 2019-12-07 DIAGNOSIS — Z90710 Acquired absence of both cervix and uterus: Secondary | ICD-10-CM

## 2019-12-07 DIAGNOSIS — M7989 Other specified soft tissue disorders: Secondary | ICD-10-CM

## 2019-12-07 DIAGNOSIS — R7989 Other specified abnormal findings of blood chemistry: Secondary | ICD-10-CM

## 2019-12-07 DIAGNOSIS — I5021 Acute systolic (congestive) heart failure: Secondary | ICD-10-CM

## 2019-12-07 DIAGNOSIS — Z8249 Family history of ischemic heart disease and other diseases of the circulatory system: Secondary | ICD-10-CM

## 2019-12-07 DIAGNOSIS — Z79899 Other long term (current) drug therapy: Secondary | ICD-10-CM

## 2019-12-07 DIAGNOSIS — Y92239 Unspecified place in hospital as the place of occurrence of the external cause: Secondary | ICD-10-CM | POA: Diagnosis not present

## 2019-12-07 DIAGNOSIS — I5041 Acute combined systolic (congestive) and diastolic (congestive) heart failure: Secondary | ICD-10-CM | POA: Diagnosis present

## 2019-12-07 DIAGNOSIS — I517 Cardiomegaly: Secondary | ICD-10-CM | POA: Diagnosis present

## 2019-12-07 DIAGNOSIS — Z20822 Contact with and (suspected) exposure to covid-19: Secondary | ICD-10-CM | POA: Diagnosis present

## 2019-12-07 DIAGNOSIS — I959 Hypotension, unspecified: Secondary | ICD-10-CM | POA: Diagnosis not present

## 2019-12-07 DIAGNOSIS — I509 Heart failure, unspecified: Secondary | ICD-10-CM

## 2019-12-07 DIAGNOSIS — E662 Morbid (severe) obesity with alveolar hypoventilation: Secondary | ICD-10-CM | POA: Diagnosis present

## 2019-12-07 DIAGNOSIS — Z888 Allergy status to other drugs, medicaments and biological substances status: Secondary | ICD-10-CM

## 2019-12-07 DIAGNOSIS — I082 Rheumatic disorders of both aortic and tricuspid valves: Secondary | ICD-10-CM | POA: Diagnosis present

## 2019-12-07 DIAGNOSIS — K5900 Constipation, unspecified: Secondary | ICD-10-CM | POA: Diagnosis not present

## 2019-12-07 DIAGNOSIS — G4733 Obstructive sleep apnea (adult) (pediatric): Secondary | ICD-10-CM | POA: Diagnosis present

## 2019-12-07 DIAGNOSIS — Z823 Family history of stroke: Secondary | ICD-10-CM

## 2019-12-07 DIAGNOSIS — R079 Chest pain, unspecified: Secondary | ICD-10-CM | POA: Diagnosis present

## 2019-12-07 DIAGNOSIS — Z6841 Body Mass Index (BMI) 40.0 and over, adult: Secondary | ICD-10-CM

## 2019-12-07 DIAGNOSIS — T502X5A Adverse effect of carbonic-anhydrase inhibitors, benzothiadiazides and other diuretics, initial encounter: Secondary | ICD-10-CM | POA: Diagnosis not present

## 2019-12-07 DIAGNOSIS — I10 Essential (primary) hypertension: Secondary | ICD-10-CM | POA: Diagnosis present

## 2019-12-07 DIAGNOSIS — I272 Pulmonary hypertension, unspecified: Secondary | ICD-10-CM | POA: Diagnosis present

## 2019-12-07 DIAGNOSIS — I110 Hypertensive heart disease with heart failure: Principal | ICD-10-CM | POA: Diagnosis present

## 2019-12-07 DIAGNOSIS — I2510 Atherosclerotic heart disease of native coronary artery without angina pectoris: Secondary | ICD-10-CM | POA: Diagnosis present

## 2019-12-07 DIAGNOSIS — F419 Anxiety disorder, unspecified: Secondary | ICD-10-CM | POA: Diagnosis present

## 2019-12-07 DIAGNOSIS — Z83438 Family history of other disorder of lipoprotein metabolism and other lipidemia: Secondary | ICD-10-CM

## 2019-12-07 DIAGNOSIS — Z833 Family history of diabetes mellitus: Secondary | ICD-10-CM

## 2019-12-07 DIAGNOSIS — I428 Other cardiomyopathies: Secondary | ICD-10-CM | POA: Diagnosis present

## 2019-12-07 HISTORY — DX: Acute combined systolic (congestive) and diastolic (congestive) heart failure: I50.41

## 2019-12-07 HISTORY — DX: Other cardiomyopathies: I42.8

## 2019-12-07 HISTORY — DX: Morbid (severe) obesity due to excess calories: E66.01

## 2019-12-07 LAB — COMPREHENSIVE METABOLIC PANEL
ALT: 88 U/L — ABNORMAL HIGH (ref 0–44)
AST: 37 U/L (ref 15–41)
Albumin: 3.6 g/dL (ref 3.5–5.0)
Alkaline Phosphatase: 89 U/L (ref 38–126)
Anion gap: 9 (ref 5–15)
BUN: 11 mg/dL (ref 6–20)
CO2: 31 mmol/L (ref 22–32)
Calcium: 9.4 mg/dL (ref 8.9–10.3)
Chloride: 100 mmol/L (ref 98–111)
Creatinine, Ser: 1.02 mg/dL — ABNORMAL HIGH (ref 0.44–1.00)
GFR calc Af Amer: >60 mL/min (ref >60)
GFR calc non Af Amer: >60 mL/min (ref >60)
Glucose, Bld: 120 mg/dL — ABNORMAL HIGH (ref 70–99)
Potassium: 4.7 mmol/L (ref 3.5–5.1)
Sodium: 140 mmol/L (ref 135–145)
Total Bilirubin: 0.7 mg/dL (ref 0.3–1.2)
Total Protein: 6.9 g/dL (ref 6.5–8.1)

## 2019-12-07 LAB — URINALYSIS, ROUTINE W REFLEX MICROSCOPIC
Bilirubin Urine: NEGATIVE
Glucose, UA: NEGATIVE mg/dL
Hgb urine dipstick: NEGATIVE
Ketones, ur: NEGATIVE mg/dL
Leukocytes,Ua: NEGATIVE
Nitrite: NEGATIVE
Protein, ur: NEGATIVE mg/dL
Specific Gravity, Urine: 1.006 (ref 1.005–1.030)
pH: 7 (ref 5.0–8.0)

## 2019-12-07 LAB — RAPID URINE DRUG SCREEN, HOSP PERFORMED
Amphetamines: NOT DETECTED
Barbiturates: NOT DETECTED
Benzodiazepines: NOT DETECTED
Cocaine: NOT DETECTED
Opiates: NOT DETECTED
Tetrahydrocannabinol: NOT DETECTED

## 2019-12-07 LAB — CBC WITH DIFFERENTIAL/PLATELET
Abs Immature Granulocytes: 0.04 10*3/uL (ref 0.00–0.07)
Basophils Absolute: 0 10*3/uL (ref 0.0–0.1)
Basophils Relative: 0 %
Eosinophils Absolute: 0.1 10*3/uL (ref 0.0–0.5)
Eosinophils Relative: 2 %
HCT: 46.2 % — ABNORMAL HIGH (ref 36.0–46.0)
Hemoglobin: 14 g/dL (ref 12.0–15.0)
Immature Granulocytes: 1 %
Lymphocytes Relative: 26 %
Lymphs Abs: 1.8 10*3/uL (ref 0.7–4.0)
MCH: 27.9 pg (ref 26.0–34.0)
MCHC: 30.3 g/dL (ref 30.0–36.0)
MCV: 92 fL (ref 80.0–100.0)
Monocytes Absolute: 0.6 10*3/uL (ref 0.1–1.0)
Monocytes Relative: 9 %
Neutro Abs: 4.2 10*3/uL (ref 1.7–7.7)
Neutrophils Relative %: 62 %
Platelets: 152 10*3/uL (ref 150–400)
RBC: 5.02 MIL/uL (ref 3.87–5.11)
RDW: 13.7 % (ref 11.5–15.5)
WBC: 6.7 10*3/uL (ref 4.0–10.5)
nRBC: 0 % (ref 0.0–0.2)

## 2019-12-07 LAB — CREATININE, URINE, RANDOM: Creatinine, Urine: <10 mg/dL

## 2019-12-07 LAB — TROPONIN I (HIGH SENSITIVITY)
Troponin I (High Sensitivity): 54 ng/L — ABNORMAL HIGH (ref <18)
Troponin I (High Sensitivity): 52 ng/L — ABNORMAL HIGH (ref <18)

## 2019-12-07 LAB — D-DIMER, QUANTITATIVE: D-Dimer, Quant: 0.99 ug/mL-FEU — ABNORMAL HIGH (ref 0.00–0.50)

## 2019-12-07 LAB — BRAIN NATRIURETIC PEPTIDE: B Natriuretic Peptide: 862 pg/mL — ABNORMAL HIGH (ref 0.0–100.0)

## 2019-12-07 LAB — SARS CORONAVIRUS 2 BY RT PCR (HOSPITAL ORDER, PERFORMED IN ~~LOC~~ HOSPITAL LAB): SARS Coronavirus 2: NEGATIVE

## 2019-12-07 LAB — SODIUM, URINE, RANDOM: Sodium, Ur: 123 mmol/L

## 2019-12-07 MED ORDER — ASPIRIN EC 81 MG PO TBEC
81.0000 mg | DELAYED_RELEASE_TABLET | Freq: Every day | ORAL | Status: DC
  Administered 2019-12-07 – 2019-12-12 (×6): 81 mg via ORAL
  Filled 2019-12-07 (×6): qty 1

## 2019-12-07 MED ORDER — SODIUM CHLORIDE 0.9 % IV SOLN: 250.0000 mL | INTRAVENOUS | Status: DC | PRN

## 2019-12-07 MED ORDER — SODIUM CHLORIDE 0.9% FLUSH
3.0000 mL | Freq: Two times a day (BID) | INTRAVENOUS | Status: DC
  Administered 2019-12-08 – 2019-12-10 (×3): 3 mL via INTRAVENOUS

## 2019-12-07 MED ORDER — FUROSEMIDE 10 MG/ML IJ SOLN
40.0000 mg | Freq: Once | INTRAMUSCULAR | Status: AC
  Administered 2019-12-07: 40 mg via INTRAVENOUS
  Filled 2019-12-07: qty 4

## 2019-12-07 MED ORDER — ACETAMINOPHEN 650 MG RE SUPP: 650.0000 mg | Freq: Four times a day (QID) | RECTAL | Status: DC | PRN

## 2019-12-07 MED ORDER — HYDROCODONE-ACETAMINOPHEN 5-325 MG PO TABS: 1.0000 | ORAL_TABLET | ORAL | Status: DC | PRN

## 2019-12-07 MED ORDER — FUROSEMIDE 10 MG/ML IJ SOLN
40.0000 mg | Freq: Two times a day (BID) | INTRAMUSCULAR | Status: DC
  Administered 2019-12-08 – 2019-12-09 (×3): 40 mg via INTRAVENOUS
  Filled 2019-12-07 (×3): qty 4

## 2019-12-07 MED ORDER — ONDANSETRON HCL 4 MG/2ML IJ SOLN
4.0000 mg | Freq: Four times a day (QID) | INTRAMUSCULAR | Status: DC | PRN
  Administered 2019-12-11: 4 mg via INTRAVENOUS
  Filled 2019-12-07: qty 2

## 2019-12-07 MED ORDER — DOCUSATE SODIUM 100 MG PO CAPS
100.0000 mg | ORAL_CAPSULE | Freq: Two times a day (BID) | ORAL | Status: DC
  Administered 2019-12-07 – 2019-12-12 (×9): 100 mg via ORAL
  Filled 2019-12-07 (×10): qty 1

## 2019-12-07 MED ORDER — ENOXAPARIN SODIUM 30 MG/0.3ML ~~LOC~~ SOLN
30.0000 mg | SUBCUTANEOUS | Status: DC
  Administered 2019-12-07: 30 mg via SUBCUTANEOUS
  Filled 2019-12-07: qty 0.3

## 2019-12-07 MED ORDER — IOHEXOL 350 MG/ML SOLN
100.0000 mL | Freq: Once | INTRAVENOUS | Status: AC | PRN
  Administered 2019-12-07: 100 mL via INTRAVENOUS

## 2019-12-07 MED ORDER — SODIUM CHLORIDE 0.9% FLUSH
3.0000 mL | Freq: Two times a day (BID) | INTRAVENOUS | Status: DC
  Administered 2019-12-10 (×2): 3 mL via INTRAVENOUS

## 2019-12-07 MED ORDER — ONDANSETRON HCL 4 MG PO TABS
4.0000 mg | ORAL_TABLET | Freq: Four times a day (QID) | ORAL | Status: DC | PRN
  Filled 2019-12-07: qty 1

## 2019-12-07 MED ORDER — SODIUM CHLORIDE 0.9% FLUSH: 3.0000 mL | INTRAVENOUS | Status: DC | PRN

## 2019-12-07 MED ORDER — FUROSEMIDE 10 MG/ML IJ SOLN: 40.0000 mg | Freq: Once | INTRAMUSCULAR | Status: DC

## 2019-12-07 MED ORDER — ACETAMINOPHEN 325 MG PO TABS
650.0000 mg | ORAL_TABLET | Freq: Four times a day (QID) | ORAL | Status: DC | PRN
  Administered 2019-12-08: 650 mg via ORAL
  Filled 2019-12-07: qty 2

## 2019-12-07 NOTE — Progress Notes (Signed)
VASCULAR LAB PRELIMINARY  PRELIMINARY  PRELIMINARY  PRELIMINARY  Bilateral lower extremity venous duplex completed.    Preliminary report:  See CV proc for preliminary results.  Gave report to Dr. Fredirick Lathe, [facility], RVT 12/07/2019, 7:24 PM

## 2019-12-07 NOTE — H&P (Signed)
Cardiology Admission History and Physical:   Patient ID: Maria Duncan MRN: [id number]; DOB: [birth year]   Admission date: 12/07/2019  Primary Care Provider: Patient, No Pcp Per Primary Cardiologist: No primary care provider on file.  Primary Electrophysiologist:  None   Chief Complaint:  Shortness of breath  Patient Profile:   Maria Duncan is a 54 y.o. female with history of HTN, obesity presents with SOB.   History of Present Illness:   Maria Duncan reports 2 weeks of worsening SOB, leg swelling. She states she has occasionally been short of breath in the past, however she has attributed this to her obesity and thought she just needed to lose weight. She has occasionally had intermittent foot/ankle swelling after being on her feet for long periods of time, however it has always improved with leg elevation.   Over the past 2 weeks the patient states that her breathing has been much worse than normal. She becomes short of breath with even minimal exertion. She report orthopnea, which is a new symptom for her. She has bilateral lower extremity edema extending proximal to the knee, which is also new and worse than what she has experienced previously. She believes she has gained about 30 or more pounds in the past year, but isn't sure whether she has gained more in the past few weeks. She eats a poor diet with lots of snack/junk food. She was previously on medication for HTN, but she stopped this as she said she developed a cough.  Also of note the patient recently experienced the death of one of her cousins. She states that she was sad about this happening but it was not overly traumatic for, as she has lost other people whom she has been closer with.   Given the patient's worsening symptom she came to the ED for evaluation. On arrival to the ED she was found to have elevated blood pressure but with normal oxygen saturation. Her ECG revealed nonspecific ST/T wave changes. Her troponin was elevated  to 54 and then 52. A chest xray suggested mild interstitial edema and a PE protocol CT scan revealed cardiomegaly, trace pericardial effusion, but no acute PE.   Cardiology was consulted for further management.   The patient denies any family history of sudden cardiac death or early CV disease.   The patient denies any personal history of CV disease. She is a never smoker. She does not drink alcohol. She does not use illicit drugs.   Heart Pathway Score:     Past Medical History:  Diagnosis Date  . Hypertension     Past Surgical History:  Procedure Laterality Date  . ABDOMINAL HYSTERECTOMY       Medications Prior to Admission: Prior to Admission medications   Medication Sig Start Date End Date Taking? Authorizing Provider  cholecalciferol (VITAMIN D3) 25 MCG (1000 UNIT) tablet Take 1,000 Units by mouth daily.   Yes [provider]  ELDERBERRY PO Take 1 capsule by mouth daily.   Yes [provider]  guaiFENesin (ROBITUSSIN) 100 MG/5ML liquid Take 200 mg by mouth daily as needed for cough.   Yes [provider]  Olive Oil (SWEET OIL) OIL Place 2 drops into both ears daily as needed (for ringing ears).   Yes [provider]  zinc gluconate 50 MG tablet Take 50 mg by mouth daily.   Yes [provider]  amoxicillin-clavulanate (AUGMENTIN) 875-125 MG per tablet Take 1 tablet by mouth 2 (two) times daily. Patient not taking: Reported on  12/07/2019 03/02/14   Carmelina Dane, MD  naproxen (NAPROSYN) 500 MG tablet Take 1 tablet (500 mg total) by mouth 2 (two) times daily between meals as needed for moderate pain. Patient not taking: Reported on 12/07/2019 06/10/16   Fayrene Helper, PA-C  traMADol (ULTRAM) 50 MG tablet Take 1 tablet (50 mg total) by mouth every 6 (six) hours as needed for pain. Patient not taking: Reported on 12/07/2019 02/04/13   Rodolph Bong, MD  traMADol (ULTRAM) 50 MG tablet Take 1 tablet (50 mg total) by mouth every 6 (six) hours  as needed. Patient not taking: Reported on 12/07/2019 03/02/14   Carmelina Dane, MD     Allergies:    Allergies  Allergen Reactions  . Hyzaar [Losartan Potassium-Hctz] Nausea And Vomiting and Cough    Social History:   Social History   Socioeconomic History  . Marital status: Married    Spouse name: Not on file  . Number of children: Not on file  . Years of education: Not on file  . Highest education level: Not on file  Occupational History  . Not on file  Tobacco Use  . Smoking status: Never Smoker  . Smokeless tobacco: Never Used  Substance and Sexual Activity  . Alcohol use: No  . Drug use: No  . Sexual activity: Not on file  Other Topics Concern  . Not on file  Social History Narrative  . Not on file   Social Determinants of Health   Financial Resource Strain:   . Difficulty of Paying Living Expenses:   Food Insecurity:   . Worried About Programme researcher, broadcasting/film/video in the Last Year:   . Barista in the Last Year:   Transportation Needs:   . Freight forwarder (Medical):   Marland Kitchen Lack of Transportation (Non-Medical):   Physical Activity:   . Days of Exercise per Week:   . Minutes of Exercise per Session:   Stress:   . Feeling of Stress :   Social Connections:   . Frequency of Communication with Friends and Family:   . Frequency of Social Gatherings with Friends and Family:   . Attends Religious Services:   . Active Member of Clubs or Organizations:   . Attends Banker Meetings:   Marland Kitchen Marital Status:   Intimate Partner Violence:   . Fear of Current or Ex-Partner:   . Emotionally Abused:   Marland Kitchen Physically Abused:   . Sexually Abused:     Family History:   The patient's family history includes Cancer in her maternal grandfather, maternal grandmother, mother, and paternal grandfather; Diabetes in her father, maternal grandfather, and paternal grandmother; Heart disease in her maternal grandfather; Hyperlipidemia in her brother, mother, and sister;  Hypertension in her brother, father, maternal grandfather, paternal grandfather, paternal grandmother, and sister; Stroke in her maternal grandmother and paternal grandfather.    ROS:  Please see the history of present illness.  All other ROS reviewed and negative.     Physical Exam/Data:   Vitals:   12/07/19 1930 12/07/19 1945 12/07/19 2015 12/07/19 2045  BP: (!) 161/100 (!) 131/95 (!) 178/125 (!) 138/121  Pulse: 98   89  Resp: 20   18  Temp:      TempSrc:      SpO2: 98%   98%  Weight:      Height:       No intake or output data in the 24 hours ending 12/07/19 2113 Last 3 Weights  12/07/2019 06/09/2016 03/02/2014  Weight (lbs) 249 lb 251 lb 233 lb 8 oz  Weight (kg) 112.946 kg 113.853 kg 105.915 kg     Body mass index is 41.44 kg/m.  General:  Well nourished, well developed, in no acute distress HEENT: normal Lymph: no adenopathy Neck: difficult to appreciate JVP given neck size Endocrine:  No thryomegaly Vascular: No carotid bruits; FA pulses 2+ bilaterally without bruits  Cardiac:  normal S1, S2; RRR; 2/6 holosystolic murmur heard across the precordium Lungs:  clear to auscultation bilaterally, no wheezing, rhonchi or rales  Abd: soft, nontender, no hepatomegaly  Ext: 2+ pitting edema extending proximal to the bilateral knees, symmetric Musculoskeletal:  No deformities, BUE and BLE strength normal and equal Skin: warm and dry  Neuro:  CNs 2-12 intact, no focal abnormalities noted Psych:  Normal affect    EKG:  The ECG that was done on arrival to the ED was personally reviewed and demonstrates NSR, borderline LVH, nonspecific ST/T wave abnormalities  Relevant CV Studies: None  Laboratory Data:  High Sensitivity Troponin:   Recent Labs  Lab 12/07/19 1827 12/07/19 2012  TROPONINIHS 54* 52*      Chemistry Recent Labs  Lab 12/07/19 1827  NA 140  K 4.7  CL 100  CO2 31  GLUCOSE 120*  BUN 11  CREATININE 1.02*  CALCIUM 9.4  GFRNONAA >60  GFRAA >60  ANIONGAP  9    Recent Labs  Lab 12/07/19 1827  PROT 6.9  ALBUMIN 3.6  AST 37  ALT 88*  ALKPHOS 89  BILITOT 0.7   Hematology Recent Labs  Lab 12/07/19 1827  WBC 6.7  RBC 5.02  HGB 14.0  HCT 46.2*  MCV 92.0  MCH 27.9  MCHC 30.3  RDW 13.7  PLT 152   BNP Recent Labs  Lab 12/07/19 1827  BNP 862.0*    DDimer  Recent Labs  Lab 12/07/19 1827  DDIMER 0.99*     Radiology/Studies:  DG Chest 2 View  Result Date: 12/07/2019 CLINICAL DATA:  Shortness of breath, swelling, fatigue and cough EXAM: CHEST - 2 VIEW COMPARISON:  CT 06/09/2016 FINDINGS: Low lung volumes with some atelectatic changes in the bases. More hazy interstitial and perihilar opacity with central cuffing and cephalized, indistinct vascularity could reflect early features of interstitial edema. Cardiac silhouette is enlarged when compared to prior exam. The aorta is calcified. The remaining cardiomediastinal contours are unremarkable. No acute osseous or soft tissue abnormality. Degenerative changes are present in the imaged spine and shoulders. Features of calcific tendinosis left shoulder. IMPRESSION: Findings suggestive of mild CHF in the given clinical setting. Electronically Signed   By: Kreg Shropshire M.D.   On: 12/07/2019 16:17   CT Angio Chest PE W and/or Wo Contrast  Result Date: 12/07/2019 CLINICAL DATA:  Shortness of breath, elevated D-dimer, negative Doppler. EXAM: CT ANGIOGRAPHY CHEST WITH CONTRAST TECHNIQUE: Multidetector CT imaging of the chest was performed using the standard protocol during bolus administration of intravenous contrast. Multiplanar CT image reconstructions and MIPs were obtained to evaluate the vascular anatomy. CONTRAST:  OMNIPAQUE IOHEXOL 350 MG/ML SOLN COMPARISON:  CT 06/10/2016 FINDINGS: Cardiovascular: Satisfactory opacification the pulmonary arteries to the segmental level. No pulmonary artery filling defects are identified. Central pulmonary arteries are normal caliber. Cardiomegaly is  similar to comparison. There is trace pericardial effusion. Poor opacification of the thoracic aorta limits evaluation of the lumen. The aorta is normal caliber. Shared origin of the brachiocephalic and left common carotid arteries. Mediastinum/Nodes: Fatty stippling  of the anterior mediastinum may reflect thymic remnant, unchanged since 2017. No mediastinal fluid or gas. Normal thyroid gland and thoracic inlet. No acute abnormality of the trachea or esophagus. No worrisome mediastinal, hilar or axillary adenopathy. Lungs/Pleura: Hypoventilatory changes are likely accentuated by imaging during exhalation for this angiographic technique. No consolidation, features of edema, pneumothorax, or effusion. No suspicious pulmonary nodules or masses. Upper Abdomen: No acute abnormalities present in the visualized portions of the upper abdomen. Musculoskeletal: Multilevel degenerative changes are present in the imaged portions of the spine. No acute osseous abnormality or suspicious osseous lesion. Review of the MIP images confirms the above findings. IMPRESSION: 1. No acute pulmonary artery filling defects to suggest pulmonary embolism. 2. Stable cardiomegaly.  Trace pericardial effusion. 3. Hypoventilatory changes, likely accentuated by imaging during exhalation for this angiographic technique. Electronically Signed   By: Lovena Le M.D.   On: 12/07/2019 20:22   VAS [place] LOWER EXTREMITY VENOUS (DVT) ([facility] and [facility] 7a-7p)  Result Date: 12/07/2019  Lower Venous DVTStudy Indications: Swelling, and SOB.  Comparison Study: No prior study on file for comparison Performing Technologist: Sharion Dove RVS  Examination Guidelines: A complete evaluation includes B-mode imaging, spectral Doppler, color Doppler, and power Doppler as needed of all accessible portions of each vessel. Bilateral testing is considered an integral part of a complete examination. Limited examinations for reoccurring indications may be performed as noted. The  reflux portion of the exam is performed with the patient in reverse Trendelenburg.  +---------+---------------+---------+-----------+----------+--------------+ RIGHT    CompressibilityPhasicitySpontaneityPropertiesThrombus Aging +---------+---------------+---------+-----------+----------+--------------+ CFV      Full           Yes      Yes                                 +---------+---------------+---------+-----------+----------+--------------+ SFJ      Full                                                        +---------+---------------+---------+-----------+----------+--------------+ FV Prox  Full                                                        +---------+---------------+---------+-----------+----------+--------------+ FV Mid   Full                                                        +---------+---------------+---------+-----------+----------+--------------+ FV DistalFull                                                        +---------+---------------+---------+-----------+----------+--------------+ PFV      Full                                                        +---------+---------------+---------+-----------+----------+--------------+  POP      Full           Yes      Yes                                 +---------+---------------+---------+-----------+----------+--------------+ PTV      Full                                                        +---------+---------------+---------+-----------+----------+--------------+ PERO     Full                                                        +---------+---------------+---------+-----------+----------+--------------+   +---------+---------------+---------+-----------+----------+--------------+ LEFT     CompressibilityPhasicitySpontaneityPropertiesThrombus Aging +---------+---------------+---------+-----------+----------+--------------+ CFV      Full           Yes       Yes                                 +---------+---------------+---------+-----------+----------+--------------+ SFJ      Full                                                        +---------+---------------+---------+-----------+----------+--------------+ FV Prox  Full                                                        +---------+---------------+---------+-----------+----------+--------------+ FV Mid   Full                                                        +---------+---------------+---------+-----------+----------+--------------+ FV DistalFull                                                        +---------+---------------+---------+-----------+----------+--------------+ PFV      Full                                                        +---------+---------------+---------+-----------+----------+--------------+ POP      Full           Yes      Yes                                 +---------+---------------+---------+-----------+----------+--------------+  PTV      Full                                                        +---------+---------------+---------+-----------+----------+--------------+ PERO     Full                                                        +---------+---------------+---------+-----------+----------+--------------+     Summary: BILATERAL: - No evidence of deep vein thrombosis seen in the lower extremities, bilaterally. -   *See table(s) above for measurements and observations.    Preliminary     Assessment and Plan:  Maria Duncan is a 54 y.o. female with history of HTN, obesity presents with SOB, found to have evidence of volume overload.   I performed a bedside ultrasound in the ED. It appears as though the patient's ejection fraction is severely decreased with global hypokinesis. Her exam is consistent with severe volume overload, suggestive of new onset heart failure. Although the patient does endorse  intermittent chest discomfort over the past several months, she does not describe anything that would suggest missed MI and her ECG reveals no pathologic Q waves. She will need diuresis to treat her volume overload and a broad workup for underlying etiology of her heart failure. Her elevated troponin is most consistent with myocardial injury in the setting of acute decompensated heart failure rather than acute coronary syndrome.   #) New Onset HF: appears to have new severe systolic dysfunction on bedside echo in ED. No evidence of shock on physical exam. Although stress cardiomyopathy is possible, her bedside echo in the ED was not classic morphology for this and she states that the recent death of her cousin was not overly traumatic for her.  - formal echo in AM - TSH, iron studies, HIV - lipid panel, A1c - 40mg  IV lasix BID - electrolyte repletion PRN - will likely need coronary angiogram - HF team consult in AM  #) Elevated troponin: per above, most likely due to decompensated heart failure rather than ACS. Currently patient has no symptoms suggestive of MI.  - ECG in AM - hold off on heparin for now - will reassess if any symptoms suggestive ACS develop - start aspirin 81mg  daily  Severity of Illness: The appropriate patient status for this patient is INPATIENT. Inpatient status is judged to be reasonable and necessary in order to provide the required intensity of service to ensure the patient's safety. The patient's presenting symptoms, physical exam findings, and initial radiographic and laboratory data in the context of their chronic comorbidities is felt to place them at high risk for further clinical deterioration. Furthermore, it is not anticipated that the patient will be medically stable for discharge from the hospital within 2 midnights of admission. The following factors support the patient status of inpatient.   " The patient's presenting symptoms include SOB. " The worrisome  physical exam findings include volume overload. " The initial radiographic and laboratory data are worrisome because of elevated BNP, elevated troponin, pulmonary edema. " The chronic co-morbidities include obesity.   * I certify that at the point of admission it is my clinical  judgment that the patient will require inpatient hospital care spanning beyond 2 midnights from the point of admission due to high intensity of service, high risk for further deterioration and high frequency of surveillance required.*    For questions or updates, please contact [facility] Please consult [email or web address] for contact info under        Signed, Rosario Jacks, MD  12/07/2019 9:13 PM

## 2019-12-07 NOTE — ED Triage Notes (Signed)
Pt c/o shortness of breath x 2 weeks, worse with exertion and while laying flat. Pt reports symptoms have been worsening in the last few days, also c/o bilateral ankle swelling. Denies known medical hx. C/o feeling anxious after the death of a family member.

## 2019-12-08 ENCOUNTER — Inpatient Hospital Stay (HOSPITAL_COMMUNITY): Payer: Self-pay

## 2019-12-08 DIAGNOSIS — I110 Hypertensive heart disease with heart failure: Principal | ICD-10-CM

## 2019-12-08 DIAGNOSIS — I5021 Acute systolic (congestive) heart failure: Secondary | ICD-10-CM

## 2019-12-08 DIAGNOSIS — R0683 Snoring: Secondary | ICD-10-CM

## 2019-12-08 DIAGNOSIS — I340 Nonrheumatic mitral (valve) insufficiency: Secondary | ICD-10-CM

## 2019-12-08 LAB — COMPREHENSIVE METABOLIC PANEL
ALT: 74 U/L — ABNORMAL HIGH (ref 0–44)
AST: 30 U/L (ref 15–41)
Albumin: 3.4 g/dL — ABNORMAL LOW (ref 3.5–5.0)
Alkaline Phosphatase: 82 U/L (ref 38–126)
Anion gap: 11 (ref 5–15)
BUN: 10 mg/dL (ref 6–20)
CO2: 33 mmol/L — ABNORMAL HIGH (ref 22–32)
Calcium: 9.3 mg/dL (ref 8.9–10.3)
Chloride: 98 mmol/L (ref 98–111)
Creatinine, Ser: 0.97 mg/dL (ref 0.44–1.00)
GFR calc Af Amer: >60 mL/min (ref >60)
GFR calc non Af Amer: >60 mL/min (ref >60)
Glucose, Bld: 154 mg/dL — ABNORMAL HIGH (ref 70–99)
Potassium: 3.8 mmol/L (ref 3.5–5.1)
Sodium: 142 mmol/L (ref 135–145)
Total Bilirubin: 1 mg/dL (ref 0.3–1.2)
Total Protein: 6.6 g/dL (ref 6.5–8.1)

## 2019-12-08 LAB — IRON AND TIBC
Iron: 58 ug/dL (ref 28–170)
Saturation Ratios: 12 % (ref 10.4–31.8)
TIBC: 472 ug/dL — ABNORMAL HIGH (ref 250–450)
UIBC: 414 ug/dL

## 2019-12-08 LAB — CBC WITH DIFFERENTIAL/PLATELET
Abs Immature Granulocytes: 0.02 10*3/uL (ref 0.00–0.07)
Basophils Absolute: 0 10*3/uL (ref 0.0–0.1)
Basophils Relative: 0 %
Eosinophils Absolute: 0.1 10*3/uL (ref 0.0–0.5)
Eosinophils Relative: 2 %
HCT: 43.9 % (ref 36.0–46.0)
Hemoglobin: 13.5 g/dL (ref 12.0–15.0)
Immature Granulocytes: 0 %
Lymphocytes Relative: 30 %
Lymphs Abs: 1.7 10*3/uL (ref 0.7–4.0)
MCH: 28 pg (ref 26.0–34.0)
MCHC: 30.8 g/dL (ref 30.0–36.0)
MCV: 91.1 fL (ref 80.0–100.0)
Monocytes Absolute: 0.7 10*3/uL (ref 0.1–1.0)
Monocytes Relative: 11 %
Neutro Abs: 3.3 10*3/uL (ref 1.7–7.7)
Neutrophils Relative %: 57 %
Platelets: 225 10*3/uL (ref 150–400)
RBC: 4.82 MIL/uL (ref 3.87–5.11)
RDW: 13.5 % (ref 11.5–15.5)
WBC: 5.9 10*3/uL (ref 4.0–10.5)
nRBC: 0 % (ref 0.0–0.2)

## 2019-12-08 LAB — LIPID PANEL
Cholesterol: 204 mg/dL — ABNORMAL HIGH (ref 0–200)
HDL: 65 mg/dL (ref >40)
LDL Cholesterol: 123 mg/dL — ABNORMAL HIGH (ref 0–99)
Total CHOL/HDL Ratio: 3.1 RATIO
Triglycerides: 82 mg/dL (ref <150)
VLDL: 16 mg/dL (ref 0–40)

## 2019-12-08 LAB — ECHOCARDIOGRAM COMPLETE
Height: 64 in
Weight: 4032 oz

## 2019-12-08 LAB — HEMOGLOBIN A1C
Hgb A1c MFr Bld: 7.1 % — ABNORMAL HIGH (ref 4.8–5.6)
Mean Plasma Glucose: 157.07 mg/dL

## 2019-12-08 LAB — HIV ANTIBODY (ROUTINE TESTING W REFLEX): HIV Screen 4th Generation wRfx: NONREACTIVE

## 2019-12-08 LAB — TSH: TSH: 2.987 u[IU]/mL (ref 0.350–4.500)

## 2019-12-08 LAB — MAGNESIUM: Magnesium: 1.7 mg/dL (ref 1.7–2.4)

## 2019-12-08 LAB — PHOSPHORUS: Phosphorus: 5.3 mg/dL — ABNORMAL HIGH (ref 2.5–4.6)

## 2019-12-08 LAB — FERRITIN: Ferritin: 23 ng/mL (ref 11–307)

## 2019-12-08 MED ORDER — SPIRONOLACTONE 25 MG PO TABS
25.0000 mg | ORAL_TABLET | Freq: Every day | ORAL | Status: DC
  Administered 2019-12-08 – 2019-12-12 (×4): 25 mg via ORAL
  Filled 2019-12-08 (×5): qty 1

## 2019-12-08 MED ORDER — IBUPROFEN 200 MG PO TABS
400.0000 mg | ORAL_TABLET | Freq: Four times a day (QID) | ORAL | Status: DC | PRN
  Administered 2019-12-08: 400 mg via ORAL
  Filled 2019-12-08: qty 2

## 2019-12-08 MED ORDER — ENOXAPARIN SODIUM 60 MG/0.6ML ~~LOC~~ SOLN
55.0000 mg | SUBCUTANEOUS | Status: DC
  Administered 2019-12-08: 55 mg via SUBCUTANEOUS
  Filled 2019-12-08: qty 0.6

## 2019-12-08 MED ORDER — SODIUM CHLORIDE 0.9% FLUSH: 3.0000 mL | INTRAVENOUS | Status: DC | PRN

## 2019-12-08 MED ORDER — SODIUM CHLORIDE 0.9 % IV SOLN: INTRAVENOUS | Status: DC

## 2019-12-08 MED ORDER — SODIUM CHLORIDE 0.9% FLUSH
3.0000 mL | Freq: Two times a day (BID) | INTRAVENOUS | Status: DC
  Administered 2019-12-08 – 2019-12-11 (×6): 3 mL via INTRAVENOUS

## 2019-12-08 MED ORDER — SODIUM CHLORIDE 0.9 % IV SOLN: 250.0000 mL | INTRAVENOUS | Status: DC | PRN

## 2019-12-08 NOTE — Progress Notes (Signed)
Patient stated that she has pain under her left breast. Stating its bad when she is moving and coughing, when she is still she is pain free. Denies chest pain. Notified PA, will come at bedside.

## 2019-12-08 NOTE — ED Provider Notes (Signed)
[facility] 3E HF PCU Provider Note   CSN: [id number] Arrival date & time: 12/07/19  1515     History Chief Complaint  Patient presents with  . Shortness of Breath    Maria Duncan is a 54 y.o. female.  Presents to the emergency room with chief complaint of shortness of breath.  Symptoms ongoing for the last couple weeks, has noted symptoms worse with exertion or lying flat.  Also has noted some lower leg swelling.  States this is equal bilaterally, no pain associated with this.  She does not have any chest pain.  Had relative die and has had some anxiety but does not feel particularly distraught from this.  Denies any chronic medical conditions, does not have a primary doctor.  Does not take any prescribed medication on a regular basis.  HPI     Past Medical History:  Diagnosis Date  . Hypertension     Patient Active Problem List   Diagnosis Date Noted  . CHF exacerbation (HCC) 12/07/2019  . Cardiomegaly 12/07/2019  . Essential hypertension 12/07/2019  . CHF (congestive heart failure) (HCC) 12/07/2019    Past Surgical History:  Procedure Laterality Date  . ABDOMINAL HYSTERECTOMY       OB History   No obstetric history on file.     Family History  Problem Relation Age of Onset  . Cancer Mother   . Hyperlipidemia Mother   . Diabetes Father   . Hypertension Father   . Hyperlipidemia Sister   . Hypertension Sister   . Hyperlipidemia Brother   . Hypertension Brother   . Cancer Maternal Grandmother   . Stroke Maternal Grandmother   . Cancer Maternal Grandfather   . Diabetes Maternal Grandfather   . Heart disease Maternal Grandfather   . Hypertension Maternal Grandfather   . Diabetes Paternal Grandmother   . Hypertension Paternal Grandmother   . Cancer Paternal Grandfather   . Hypertension Paternal Grandfather   . Stroke Paternal Grandfather     Social History   Tobacco Use  . Smoking status: Never Smoker  . Smokeless tobacco: Never Used    Substance Use Topics  . Alcohol use: No  . Drug use: No    Home Medications Prior to Admission medications   Medication Sig Start Date End Date Taking? Authorizing Provider  cholecalciferol (VITAMIN D3) 25 MCG (1000 UNIT) tablet Take 1,000 Units by mouth daily.   Yes [provider]  ELDERBERRY PO Take 1 capsule by mouth daily.   Yes [provider]  guaiFENesin (ROBITUSSIN) 100 MG/5ML liquid Take 200 mg by mouth daily as needed for cough.   Yes [provider]  Olive Oil (SWEET OIL) OIL Place 2 drops into both ears daily as needed (for ringing ears).   Yes [provider]  zinc gluconate 50 MG tablet Take 50 mg by mouth daily.   Yes [provider]  amoxicillin-clavulanate (AUGMENTIN) 875-125 MG per tablet Take 1 tablet by mouth 2 (two) times daily. Patient not taking: Reported on 12/07/2019 03/02/14   Carmelina Dane, MD  naproxen (NAPROSYN) 500 MG tablet Take 1 tablet (500 mg total) by mouth 2 (two) times daily between meals as needed for moderate pain. Patient not taking: Reported on 12/07/2019 06/10/16   Fayrene Helper, PA-C  traMADol (ULTRAM) 50 MG tablet Take 1 tablet (50 mg total) by mouth every 6 (six) hours as needed for pain. Patient not taking: Reported on 12/07/2019 02/04/13   Rodolph Bong, MD  traMADol (ULTRAM) 50 MG tablet Take 1 tablet (50 mg total) by mouth every 6 (six) hours as needed. Patient not taking: Reported on 12/07/2019 03/02/14   Roselee Culver, MD    Allergies    Hyzaar [losartan potassium-hctz]  Review of Systems   Review of Systems  Constitutional: Negative for chills and fever.  HENT: Negative for ear pain and sore throat.   Eyes: Negative for pain and visual disturbance.  Respiratory: Positive for shortness of breath. Negative for cough.   Cardiovascular: Negative for chest pain and palpitations.  Gastrointestinal: Negative for abdominal pain and vomiting.  Genitourinary: Negative for dysuria and  hematuria.  Musculoskeletal: Negative for arthralgias and back pain.  Skin: Negative for color change and rash.  Neurological: Negative for seizures and syncope.  All other systems reviewed and are negative.   Physical Exam Updated Vital Signs BP (!) 155/96   Pulse 89   Temp 98.1 F (36.7 C) (Oral)   Resp 18   Ht 5\' 5"  (1.651 m)   Wt 112.9 kg   SpO2 98%   BMI 41.44 kg/m   Physical Exam Vitals and nursing note reviewed.  Constitutional:      General: She is not in acute distress.    Appearance: She is well-developed.  HENT:     Head: Normocephalic and atraumatic.  Eyes:     Conjunctiva/sclera: Conjunctivae normal.  Cardiovascular:     Rate and Rhythm: Normal rate and regular rhythm.     Heart sounds: No murmur.  Pulmonary:     Effort: Pulmonary effort is normal. No respiratory distress.     Comments: Mild tachypnea, no respiratory distress, mild crackles Abdominal:     Palpations: Abdomen is soft.     Tenderness: There is no abdominal tenderness.  Musculoskeletal:     Cervical back: Neck supple.     Comments: Mild bilateral lower leg edema extending from feet to mid calf  Skin:    General: Skin is warm and dry.  Neurological:     Mental Status: She is alert.     ED Results / Procedures / Treatments   Labs (all labs ordered are listed, but only abnormal results are displayed) Labs Reviewed  CBC WITH DIFFERENTIAL/PLATELET - Abnormal; Notable for the following components:      Result Value   HCT 46.2 (*)    All other components within normal limits  COMPREHENSIVE METABOLIC PANEL - Abnormal; Notable for the following components:   Glucose, Bld 120 (*)    Creatinine, Ser 1.02 (*)    ALT 88 (*)    All other components within normal limits  BRAIN NATRIURETIC PEPTIDE - Abnormal; Notable for the following components:   B Natriuretic Peptide 862.0 (*)    All other components within normal limits  D-DIMER, QUANTITATIVE (NOT AT [facility]) - Abnormal; Notable for the  following components:   D-Dimer, Quant 0.99 (*)    All other components within normal limits  URINALYSIS, ROUTINE W REFLEX MICROSCOPIC - Abnormal; Notable for the following components:   Color, Urine COLORLESS (*)    All other components within normal limits  TROPONIN I (HIGH SENSITIVITY) - Abnormal; Notable for the following components:   Troponin I (High Sensitivity) 54 (*)    All other components within normal limits  TROPONIN I (HIGH SENSITIVITY) - Abnormal; Notable for the following components:   Troponin I (High Sensitivity) 52 (*)    All other components within normal limits  SARS CORONAVIRUS 2 BY RT PCR [facility]  ORDER, PERFORMED IN [facility])  RAPID URINE DRUG SCREEN, HOSP PERFORMED  SODIUM, URINE, RANDOM  CREATININE, URINE, RANDOM  HIV ANTIBODY (ROUTINE TESTING W REFLEX)  MAGNESIUM  PHOSPHORUS  CBC WITH DIFFERENTIAL/PLATELET  TSH  COMPREHENSIVE METABOLIC PANEL  FERRITIN  IRON AND TIBC  HEMOGLOBIN A1C  LIPID PANEL    EKG EKG Interpretation  Date/Time:  Saturday Dec 07 2019 15:16:58 EDT Ventricular Rate:  95 PR Interval:  182 QRS Duration: 84 QT Interval:  376 QTC Calculation: 472 R Axis:   76 Text Interpretation: Normal sinus rhythm Nonspecific ST and T wave abnormality Abnormal ECG Confirmed by Marianna Fuss ([id number]) on 12/07/2019 6:07:58 PM   Radiology DG Chest 2 View  Result Date: 12/07/2019 CLINICAL DATA:  Shortness of breath, swelling, fatigue and cough EXAM: CHEST - 2 VIEW COMPARISON:  CT 06/09/2016 FINDINGS: Low lung volumes with some atelectatic changes in the bases. More hazy interstitial and perihilar opacity with central cuffing and cephalized, indistinct vascularity could reflect early features of interstitial edema. Cardiac silhouette is enlarged when compared to prior exam. The aorta is calcified. The remaining cardiomediastinal contours are unremarkable. No acute osseous or soft tissue abnormality. Degenerative changes are present in  the imaged spine and shoulders. Features of calcific tendinosis left shoulder. IMPRESSION: Findings suggestive of mild CHF in the given clinical setting. Electronically Signed   By: Kreg Shropshire M.D.   On: 12/07/2019 16:17   CT Angio Chest PE W and/or Wo Contrast  Result Date: 12/07/2019 CLINICAL DATA:  Shortness of breath, elevated D-dimer, negative Doppler. EXAM: CT ANGIOGRAPHY CHEST WITH CONTRAST TECHNIQUE: Multidetector CT imaging of the chest was performed using the standard protocol during bolus administration of intravenous contrast. Multiplanar CT image reconstructions and MIPs were obtained to evaluate the vascular anatomy. CONTRAST:  OMNIPAQUE IOHEXOL 350 MG/ML SOLN COMPARISON:  CT 06/10/2016 FINDINGS: Cardiovascular: Satisfactory opacification the pulmonary arteries to the segmental level. No pulmonary artery filling defects are identified. Central pulmonary arteries are normal caliber. Cardiomegaly is similar to comparison. There is trace pericardial effusion. Poor opacification of the thoracic aorta limits evaluation of the lumen. The aorta is normal caliber. Shared origin of the brachiocephalic and left common carotid arteries. Mediastinum/Nodes: Fatty stippling of the anterior mediastinum may reflect thymic remnant, unchanged since 2017. No mediastinal fluid or gas. Normal thyroid gland and thoracic inlet. No acute abnormality of the trachea or esophagus. No worrisome mediastinal, hilar or axillary adenopathy. Lungs/Pleura: Hypoventilatory changes are likely accentuated by imaging during exhalation for this angiographic technique. No consolidation, features of edema, pneumothorax, or effusion. No suspicious pulmonary nodules or masses. Upper Abdomen: No acute abnormalities present in the visualized portions of the upper abdomen. Musculoskeletal: Multilevel degenerative changes are present in the imaged portions of the spine. No acute osseous abnormality or suspicious osseous lesion. Review  of the MIP images confirms the above findings. IMPRESSION: 1. No acute pulmonary artery filling defects to suggest pulmonary embolism. 2. Stable cardiomegaly.  Trace pericardial effusion. 3. Hypoventilatory changes, likely accentuated by imaging during exhalation for this angiographic technique. Electronically Signed   By: Kreg Shropshire M.D.   On: 12/07/2019 20:22   VAS [place] LOWER EXTREMITY VENOUS (DVT) ([facility] and [facility] 7a-7p)  Result Date: 12/07/2019  Lower Venous DVTStudy Indications: Swelling, and SOB.  Comparison Study: No prior study on file for comparison Performing Technologist: Sherren Kerns RVS  Examination Guidelines: A complete evaluation includes B-mode imaging, spectral Doppler, color Doppler, and power Doppler as needed of all accessible portions of each  vessel. Bilateral testing is considered an integral part of a complete examination. Limited examinations for reoccurring indications may be performed as noted. The reflux portion of the exam is performed with the patient in reverse Trendelenburg.  +---------+---------------+---------+-----------+----------+--------------+ RIGHT    CompressibilityPhasicitySpontaneityPropertiesThrombus Aging +---------+---------------+---------+-----------+----------+--------------+ CFV      Full           Yes      Yes                                 +---------+---------------+---------+-----------+----------+--------------+ SFJ      Full                                                        +---------+---------------+---------+-----------+----------+--------------+ FV Prox  Full                                                        +---------+---------------+---------+-----------+----------+--------------+ FV Mid   Full                                                        +---------+---------------+---------+-----------+----------+--------------+ FV DistalFull                                                         +---------+---------------+---------+-----------+----------+--------------+ PFV      Full                                                        +---------+---------------+---------+-----------+----------+--------------+ POP      Full           Yes      Yes                                 +---------+---------------+---------+-----------+----------+--------------+ PTV      Full                                                        +---------+---------------+---------+-----------+----------+--------------+ PERO     Full                                                        +---------+---------------+---------+-----------+----------+--------------+   +---------+---------------+---------+-----------+----------+--------------+ LEFT     CompressibilityPhasicitySpontaneityPropertiesThrombus Aging +---------+---------------+---------+-----------+----------+--------------+  CFV      Full           Yes      Yes                                 +---------+---------------+---------+-----------+----------+--------------+ SFJ      Full                                                        +---------+---------------+---------+-----------+----------+--------------+ FV Prox  Full                                                        +---------+---------------+---------+-----------+----------+--------------+ FV Mid   Full                                                        +---------+---------------+---------+-----------+----------+--------------+ FV DistalFull                                                        +---------+---------------+---------+-----------+----------+--------------+ PFV      Full                                                        +---------+---------------+---------+-----------+----------+--------------+ POP      Full           Yes      Yes                                  +---------+---------------+---------+-----------+----------+--------------+ PTV      Full                                                        +---------+---------------+---------+-----------+----------+--------------+ PERO     Full                                                        +---------+---------------+---------+-----------+----------+--------------+     Summary: BILATERAL: - No evidence of deep vein thrombosis seen in the lower extremities, bilaterally. -   *See table(s) above for measurements and observations.    Preliminary     Procedures Procedures (including critical care time)  Medications Ordered in ED Medications  sodium chloride flush (NS) 0.9 %  injection 3 mL (has no administration in time range)  acetaminophen (TYLENOL) tablet 650 mg (has no administration in time range)    Or  acetaminophen (TYLENOL) suppository 650 mg (has no administration in time range)  HYDROcodone-acetaminophen (NORCO/VICODIN) 5-325 MG per tablet 1-2 tablet (has no administration in time range)  docusate sodium (COLACE) capsule 100 mg (100 mg Oral Given 12/07/19 2159)  ondansetron (ZOFRAN) tablet 4 mg (has no administration in time range)    Or  ondansetron (ZOFRAN) injection 4 mg (has no administration in time range)  furosemide (LASIX) injection 40 mg (has no administration in time range)  aspirin EC tablet 81 mg (81 mg Oral Given 12/07/19 2159)  enoxaparin (LOVENOX) injection 30 mg (30 mg Subcutaneous Given 12/07/19 2207)  sodium chloride flush (NS) 0.9 % injection 3 mL (has no administration in time range)  sodium chloride flush (NS) 0.9 % injection 3 mL (has no administration in time range)  0.9 %  sodium chloride infusion (has no administration in time range)  iohexol (OMNIPAQUE) 350 MG/ML injection 100 mL (100 mLs Intravenous Contrast Given 12/07/19 2002)  furosemide (LASIX) injection 40 mg (40 mg Intravenous Given 12/07/19 2054)    ED Course  I have reviewed the triage vital  signs and the nursing notes.  Pertinent labs & imaging results that were available during my care of the patient were reviewed by me and considered in my medical decision making (see chart for details).  Clinical Course as of Dec 07 13  Sat Dec 07, 2019     2109 D/w hosp - [facility], wants cards to consult, she will admit   [RD]  Discussed case with cardiologist, he will come to bedside and admit, updated [facility]  Clinical Course User Index [RD] Milagros Loll, MD     MDM Rules/Calculators/A&P                      54 year old lady presenting to ER with new onset shortness of breath, orthopnea.  On exam patient was in no distress, vital signs were stable, no hypoxia.  CXR concerning for cardiomegaly, vascular congestion.  Labs were notable for mild elevation in troponin, profound elevation in BNP.  Also noted slight elevation in D-dimer.  CTA chest however was negative for pulmonary embolism.  Lower extremity studies were negative for DVT.  Given these findings, suspect patient has new onset heart failure.  Initially discussed case with hospitalist, subsequently discussed with cardiologist and cardiology ultimately admitted the patient for further management.  Started IV diuretic therapy with Lasix in ER and patient had good response.   [facility] accepting.  Final Clinical Impression(s) / ED Diagnoses Final diagnoses:  Acute systolic congestive heart failure [facility])    Rx / DC Orders ED Discharge Orders    None       Milagros Loll, MD 12/08/19 [phone]

## 2019-12-08 NOTE — Plan of Care (Signed)

## 2019-12-08 NOTE — Progress Notes (Signed)
  Echocardiogram 2D Echocardiogram has been performed.  Maria Duncan 12/08/2019, 12:50 PM

## 2019-12-08 NOTE — Progress Notes (Signed)
Progress Note   Subjective   Doing well today, the patient denies CP or SOB.  No new concerns  Inpatient Medications    Scheduled Meds: . aspirin EC  81 mg Oral Daily  . docusate sodium  100 mg Oral BID  . enoxaparin (LOVENOX) injection  30 mg Subcutaneous Q24H  . furosemide  40 mg Intravenous Q12H  . sodium chloride flush  3 mL Intravenous Q12H  . sodium chloride flush  3 mL Intravenous Q12H   Continuous Infusions: . sodium chloride     PRN Meds: sodium chloride, acetaminophen **OR** acetaminophen, HYDROcodone-acetaminophen, ondansetron **OR** ondansetron (ZOFRAN) IV, sodium chloride flush   Vital Signs    Vitals:   12/07/19 2300 12/08/19 0028 12/08/19 0351 12/08/19 0723  BP:  (!) 154/99 (!) 145/81 (!) 160/89  Pulse:  96 95 97  Resp:  16 16 20   Temp:  98.9 F (37.2 C) 98.5 F (36.9 C) 98.4 F (36.9 C)  TempSrc:  Oral Oral Oral  SpO2: 98% 94% 91% 93%  Weight:   114.3 kg   Height:   5\' 4"  (1.626 m)     Intake/Output Summary (Last 24 hours) at 12/08/2019 0841 Last data filed at 12/08/2019 12/10/2019 Gross per 24 hour  Intake 120 ml  Output 2575 ml  Net -2455 ml   Filed Weights   12/07/19 1523 12/08/19 0351  Weight: 112.9 kg 114.3 kg    Telemetry    sinus - Personally Reviewed  Physical Exam   GEN- The patient is overweight appearing, sleeping but arouses, snoring heavily Head- normocephalic, atraumatic Eyes-  Sclera clear, conjunctiva pink Ears- hearing intact Oropharynx- clear Neck- supple,  + elevated JVP Lungs-  Few bibasilar rales, normal work of breathing Heart- Regular rate and rhythm  GI- soft  Extremities- no clubbing, cyanosis, + edema  MS- no significant deformity or atrophy Skin- no rash or lesion Psych- euthymic mood, full affect Neuro- strength and sensation are intact   Labs    Chemistry Recent Labs  Lab 12/07/19 1827 12/08/19 0435  NA 140 142  K 4.7 3.8  CL 100 98  CO2 31 33*  GLUCOSE 120* 154*  BUN 11 10  CREATININE  1.02* 0.97  CALCIUM 9.4 9.3  PROT 6.9 6.6  ALBUMIN 3.6 3.4*  AST 37 30  ALT 88* 74*  ALKPHOS 89 82  BILITOT 0.7 1.0  GFRNONAA >60 >60  GFRAA >60 >60  ANIONGAP 9 11     Hematology Recent Labs  Lab 12/07/19 1827 12/08/19 0435  WBC 6.7 5.9  RBC 5.02 4.82  HGB 14.0 13.5  HCT 46.2* 43.9  MCV 92.0 91.1  MCH 27.9 28.0  MCHC 30.3 30.8  RDW 13.7 13.5  PLT 152 225     Patient Profile:   Maria Duncan is a 54 y.o. female with history of HTN, obesity presents with SOB.   Found to have acute systolic dysfunction.   Assessment & Plan    1.  Acute systolic dysfunction Limited echo by fellow overnight felt to show substantially reduced EF Remains volume overloaded on exam Continue IV diuresis I agree that RHC/LHC would be beneficial. I would therefore recommend right and left heart catheterization with possible PCI.  Discussed the cath with the patient. The patient understands that risks included but are not limited to stroke (1 in 1000), death (1 in 1000), kidney failure [usually temporary] (1 in 500), bleeding (1 in 200), allergic reaction [possibly serious] (1 in 200). The patient understands and agrees  to proceed.  Awaiting formal echo today Start spironolactone 25mg  daily today Consider addition of coreg/ entresto pending official echo and cath results.  Of note, she has had N/V and cough with hyzaar previously.  2. Morbid obesity Body mass index is 43.26 kg/m. Lifestyle modification is advised  3. Hypertensive cardiovascular disease with CHF Possibly the cause for her cardiomyopathy As  Above Add spironolactone today  4. Snoring Outpatient sleep study is advised   Thompson Grayer MD, [facility] 12/08/2019 8:41 AM

## 2019-12-08 NOTE — Progress Notes (Signed)
Pt HR dropped in to mid 40s several times. Non sustained. Patient asleep. On call cardiologist paged with no response.

## 2019-12-08 NOTE — Progress Notes (Signed)
Pt's HR dropped again to 39 while asleep, per CCMD junctional rhythm and goes back to normal. [facility] cardiology paged.No new orders.

## 2019-12-08 NOTE — H&P (View-Only) (Signed)
Progress Note   Subjective   Doing well today, the patient denies CP or SOB.  No new concerns  Inpatient Medications    Scheduled Meds: . aspirin EC  81 mg Oral Daily  . docusate sodium  100 mg Oral BID  . enoxaparin (LOVENOX) injection  30 mg Subcutaneous Q24H  . furosemide  40 mg Intravenous Q12H  . sodium chloride flush  3 mL Intravenous Q12H  . sodium chloride flush  3 mL Intravenous Q12H   Continuous Infusions: . sodium chloride     PRN Meds: sodium chloride, acetaminophen **OR** acetaminophen, HYDROcodone-acetaminophen, ondansetron **OR** ondansetron (ZOFRAN) IV, sodium chloride flush   Vital Signs    Vitals:   12/07/19 2300 12/08/19 0028 12/08/19 0351 12/08/19 0723  BP:  (!) 154/99 (!) 145/81 (!) 160/89  Pulse:  96 95 97  Resp:  16 16 20   Temp:  98.9 F (37.2 C) 98.5 F (36.9 C) 98.4 F (36.9 C)  TempSrc:  Oral Oral Oral  SpO2: 98% 94% 91% 93%  Weight:   114.3 kg   Height:   5\' 4"  (1.626 m)     Intake/Output Summary (Last 24 hours) at 12/08/2019 0841 Last data filed at 12/08/2019 12/10/2019 Gross per 24 hour  Intake 120 ml  Output 2575 ml  Net -2455 ml   Filed Weights   12/07/19 1523 12/08/19 0351  Weight: 112.9 kg 114.3 kg    Telemetry    sinus - Personally Reviewed  Physical Exam   GEN- The patient is overweight appearing, sleeping but arouses, snoring heavily Head- normocephalic, atraumatic Eyes-  Sclera clear, conjunctiva pink Ears- hearing intact Oropharynx- clear Neck- supple,  + elevated JVP Lungs-  Few bibasilar rales, normal work of breathing Heart- Regular rate and rhythm  GI- soft  Extremities- no clubbing, cyanosis, + edema  MS- no significant deformity or atrophy Skin- no rash or lesion Psych- euthymic mood, full affect Neuro- strength and sensation are intact   Labs    Chemistry Recent Labs  Lab 12/07/19 1827 12/08/19 0435  NA 140 142  K 4.7 3.8  CL 100 98  CO2 31 33*  GLUCOSE 120* 154*  BUN 11 10  CREATININE  1.02* 0.97  CALCIUM 9.4 9.3  PROT 6.9 6.6  ALBUMIN 3.6 3.4*  AST 37 30  ALT 88* 74*  ALKPHOS 89 82  BILITOT 0.7 1.0  GFRNONAA >60 >60  GFRAA >60 >60  ANIONGAP 9 11     Hematology Recent Labs  Lab 12/07/19 1827 12/08/19 0435  WBC 6.7 5.9  RBC 5.02 4.82  HGB 14.0 13.5  HCT 46.2* 43.9  MCV 92.0 91.1  MCH 27.9 28.0  MCHC 30.3 30.8  RDW 13.7 13.5  PLT 152 225     Patient Profile:   Maria Duncan is a 54 y.o. female with history of HTN, obesity presents with SOB.   Found to have acute systolic dysfunction.   Assessment & Plan    1.  Acute systolic dysfunction Limited echo by fellow overnight felt to show substantially reduced EF Remains volume overloaded on exam Continue IV diuresis I agree that RHC/LHC would be beneficial. I would therefore recommend right and left heart catheterization with possible PCI.  Discussed the cath with the patient. The patient understands that risks included but are not limited to stroke (1 in 1000), death (1 in 1000), kidney failure [usually temporary] (1 in 500), bleeding (1 in 200), allergic reaction [possibly serious] (1 in 200). The patient understands and agrees  to proceed.  Awaiting formal echo today Start spironolactone 25mg daily today Consider addition of coreg/ entresto pending official echo and cath results.  Of note, she has had N/V and cough with hyzaar previously.  2. Morbid obesity Body mass index is 43.26 kg/m. Lifestyle modification is advised  3. Hypertensive cardiovascular disease with CHF Possibly the cause for her cardiomyopathy As  Above Add spironolactone today  4. Snoring Outpatient sleep study is advised   Burnie Hank MD, FACC 12/08/2019 8:41 AM  

## 2019-12-08 NOTE — Psychosocial Assessment (Signed)
Per CCMD patients HR dropped down to 41, patient asleep, asymptomatic.Will continue to monitor.

## 2019-12-08 NOTE — Plan of Care (Signed)

## 2019-12-09 ENCOUNTER — Encounter (HOSPITAL_COMMUNITY)
Admission: EM | Disposition: A | Discharge status: Home Health | Payer: Self-pay | Source: Home / Self Care | Attending: Internal Medicine

## 2019-12-09 DIAGNOSIS — I517 Cardiomegaly: Secondary | ICD-10-CM

## 2019-12-09 DIAGNOSIS — I10 Essential (primary) hypertension: Secondary | ICD-10-CM

## 2019-12-09 DIAGNOSIS — E662 Morbid (severe) obesity with alveolar hypoventilation: Secondary | ICD-10-CM

## 2019-12-09 HISTORY — PX: RIGHT/LEFT HEART CATH AND CORONARY ANGIOGRAPHY: CATH118266

## 2019-12-09 LAB — POCT I-STAT EG7
Acid-Base Excess: 10 mmol/L — ABNORMAL HIGH (ref 0.0–2.0)
Acid-Base Excess: 9 mmol/L — ABNORMAL HIGH (ref 0.0–2.0)
Bicarbonate: 39 mmol/L — ABNORMAL HIGH (ref 20.0–28.0)
Bicarbonate: 41.3 mmol/L — ABNORMAL HIGH (ref 20.0–28.0)
Calcium, Ion: 1.02 mmol/L — ABNORMAL LOW (ref 1.15–1.40)
Calcium, Ion: 1.21 mmol/L (ref 1.15–1.40)
HCT: 40 % (ref 36.0–46.0)
HCT: 45 % (ref 36.0–46.0)
Hemoglobin: 13.6 g/dL (ref 12.0–15.0)
Hemoglobin: 15.3 g/dL — ABNORMAL HIGH (ref 12.0–15.0)
O2 Saturation: 61 %
O2 Saturation: 66 %
Potassium: 3.1 mmol/L — ABNORMAL LOW (ref 3.5–5.1)
Potassium: 3.6 mmol/L (ref 3.5–5.1)
Sodium: 143 mmol/L (ref 135–145)
Sodium: 145 mmol/L (ref 135–145)
TCO2: 42 mmol/L — ABNORMAL HIGH (ref 22–32)
TCO2: 44 mmol/L — ABNORMAL HIGH (ref 22–32)
pCO2, Ven: 86 mmHg (ref 44.0–60.0)
pCO2, Ven: 88.1 mmHg (ref 44.0–60.0)
pH, Ven: 7.265 (ref 7.250–7.430)
pH, Ven: 7.279 (ref 7.250–7.430)
pO2, Ven: 38 mmHg (ref 32.0–45.0)
pO2, Ven: 41 mmHg (ref 32.0–45.0)

## 2019-12-09 LAB — POCT I-STAT 7, (LYTES, BLD GAS, ICA,H+H)
Acid-Base Excess: 10 mmol/L — ABNORMAL HIGH (ref 0.0–2.0)
Acid-Base Excess: 11 mmol/L — ABNORMAL HIGH (ref 0.0–2.0)
Acid-Base Excess: 9 mmol/L — ABNORMAL HIGH (ref 0.0–2.0)
Acid-Base Excess: 12 mmol/L — ABNORMAL HIGH (ref 0.0–2.0)
Bicarbonate: 39.7 mmol/L — ABNORMAL HIGH (ref 20.0–28.0)
Bicarbonate: 40.4 mmol/L — ABNORMAL HIGH (ref 20.0–28.0)
Bicarbonate: 41.8 mmol/L — ABNORMAL HIGH (ref 20.0–28.0)
Bicarbonate: 41.9 mmol/L — ABNORMAL HIGH (ref 20.0–28.0)
Calcium, Ion: 1.11 mmol/L — ABNORMAL LOW (ref 1.15–1.40)
Calcium, Ion: 1.15 mmol/L (ref 1.15–1.40)
Calcium, Ion: 1.24 mmol/L (ref 1.15–1.40)
Calcium, Ion: 1.2 mmol/L (ref 1.15–1.40)
HCT: 41 % (ref 36.0–46.0)
HCT: 42 % (ref 36.0–46.0)
HCT: 43 % (ref 36.0–46.0)
HCT: 45 % (ref 36.0–46.0)
Hemoglobin: 13.9 g/dL (ref 12.0–15.0)
Hemoglobin: 14.3 g/dL (ref 12.0–15.0)
Hemoglobin: 14.6 g/dL (ref 12.0–15.0)
Hemoglobin: 15.3 g/dL — ABNORMAL HIGH (ref 12.0–15.0)
O2 Saturation: 82 %
O2 Saturation: 93 %
O2 Saturation: 98 %
O2 Saturation: 95 %
Potassium: 3.4 mmol/L — ABNORMAL LOW (ref 3.5–5.1)
Potassium: 3.5 mmol/L (ref 3.5–5.1)
Potassium: 3.8 mmol/L (ref 3.5–5.1)
Potassium: 3.5 mmol/L (ref 3.5–5.1)
Sodium: 141 mmol/L (ref 135–145)
Sodium: 142 mmol/L (ref 135–145)
Sodium: 144 mmol/L (ref 135–145)
Sodium: 141 mmol/L (ref 135–145)
TCO2: 42 mmol/L — ABNORMAL HIGH (ref 22–32)
TCO2: 43 mmol/L — ABNORMAL HIGH (ref 22–32)
TCO2: 45 mmol/L — ABNORMAL HIGH (ref 22–32)
TCO2: 44 mmol/L — ABNORMAL HIGH (ref 22–32)
pCO2 arterial: 82.7 mmHg (ref 32.0–48.0)
pCO2 arterial: 88 mmHg (ref 32.0–48.0)
pCO2 arterial: 90.5 mmHg (ref 32.0–48.0)
pCO2 arterial: 75.8 mmHg (ref 32.0–48.0)
pH, Arterial: 7.27 — ABNORMAL LOW (ref 7.350–7.450)
pH, Arterial: 7.272 — ABNORMAL LOW (ref 7.350–7.450)
pH, Arterial: 7.289 — ABNORMAL LOW (ref 7.350–7.450)
pH, Arterial: 7.351 (ref 7.350–7.450)
pO2, Arterial: 139 mmHg — ABNORMAL HIGH (ref 83.0–108.0)
pO2, Arterial: 55 mmHg — ABNORMAL LOW (ref 83.0–108.0)
pO2, Arterial: 81 mmHg — ABNORMAL LOW (ref 83.0–108.0)
pO2, Arterial: 86 mmHg (ref 83.0–108.0)

## 2019-12-09 SURGERY — RIGHT/LEFT HEART CATH AND CORONARY ANGIOGRAPHY
Anesthesia: LOCAL

## 2019-12-09 MED ORDER — HEPARIN (PORCINE) IN NACL 1000-0.9 UT/500ML-% IV SOLN
INTRAVENOUS | Status: AC
  Filled 2019-12-09: qty 500

## 2019-12-09 MED ORDER — LABETALOL HCL 5 MG/ML IV SOLN: 10.0000 mg | INTRAVENOUS | Status: AC | PRN

## 2019-12-09 MED ORDER — FENTANYL CITRATE (PF) 100 MCG/2ML IJ SOLN
INTRAMUSCULAR | Status: AC
  Filled 2019-12-09: qty 2

## 2019-12-09 MED ORDER — HEPARIN (PORCINE) IN NACL 1000-0.9 UT/500ML-% IV SOLN
INTRAVENOUS | Status: DC | PRN
  Administered 2019-12-09 (×2): 500 mL

## 2019-12-09 MED ORDER — SODIUM CHLORIDE 0.9 % IV SOLN: 250.0000 mL | INTRAVENOUS | Status: DC | PRN

## 2019-12-09 MED ORDER — ACETAMINOPHEN 325 MG PO TABS: 650.0000 mg | ORAL_TABLET | ORAL | Status: DC | PRN

## 2019-12-09 MED ORDER — LIDOCAINE HCL (PF) 1 % IJ SOLN
INTRAMUSCULAR | Status: DC | PRN
  Administered 2019-12-09 (×2): 2 mL via INTRADERMAL

## 2019-12-09 MED ORDER — MIDAZOLAM HCL 2 MG/2ML IJ SOLN
INTRAMUSCULAR | Status: DC | PRN
  Administered 2019-12-09: 2 mg via INTRAVENOUS

## 2019-12-09 MED ORDER — HEPARIN SODIUM (PORCINE) 1000 UNIT/ML IJ SOLN
INTRAMUSCULAR | Status: AC
  Filled 2019-12-09: qty 1

## 2019-12-09 MED ORDER — HYDRALAZINE HCL 20 MG/ML IJ SOLN: 10.0000 mg | INTRAMUSCULAR | Status: AC | PRN

## 2019-12-09 MED ORDER — FENTANYL CITRATE (PF) 100 MCG/2ML IJ SOLN
INTRAMUSCULAR | Status: DC | PRN
  Administered 2019-12-09: 25 ug via INTRAVENOUS

## 2019-12-09 MED ORDER — ENOXAPARIN SODIUM 40 MG/0.4ML ~~LOC~~ SOLN
40.0000 mg | SUBCUTANEOUS | Status: DC
Start: 2019-12-10 — End: 2019-12-09

## 2019-12-09 MED ORDER — ONDANSETRON HCL 4 MG/2ML IJ SOLN: 4.0000 mg | Freq: Four times a day (QID) | INTRAMUSCULAR | Status: DC | PRN

## 2019-12-09 MED ORDER — FUROSEMIDE 10 MG/ML IJ SOLN
INTRAMUSCULAR | Status: DC | PRN
  Administered 2019-12-09: 80 mg via INTRAVENOUS

## 2019-12-09 MED ORDER — VERAPAMIL HCL 2.5 MG/ML IV SOLN
INTRAVENOUS | Status: AC
  Filled 2019-12-09: qty 2

## 2019-12-09 MED ORDER — SODIUM CHLORIDE 0.9% FLUSH: 3.0000 mL | Freq: Two times a day (BID) | INTRAVENOUS | Status: DC

## 2019-12-09 MED ORDER — MIDAZOLAM HCL 2 MG/2ML IJ SOLN
INTRAMUSCULAR | Status: AC
  Filled 2019-12-09: qty 2

## 2019-12-09 MED ORDER — FUROSEMIDE 10 MG/ML IJ SOLN
80.0000 mg | Freq: Two times a day (BID) | INTRAMUSCULAR | Status: DC
  Administered 2019-12-09 – 2019-12-10 (×2): 80 mg via INTRAVENOUS
  Filled 2019-12-09 (×2): qty 8

## 2019-12-09 MED ORDER — FUROSEMIDE 10 MG/ML IJ SOLN
INTRAMUSCULAR | Status: AC
  Filled 2019-12-09: qty 4

## 2019-12-09 MED ORDER — HEPARIN SODIUM (PORCINE) 1000 UNIT/ML IJ SOLN
INTRAMUSCULAR | Status: DC | PRN
  Administered 2019-12-09: 5000 [IU] via INTRAVENOUS

## 2019-12-09 MED ORDER — IOHEXOL 350 MG/ML SOLN
INTRAVENOUS | Status: DC | PRN
  Administered 2019-12-09: 30 mL via INTRA_ARTERIAL

## 2019-12-09 MED ORDER — ENOXAPARIN SODIUM 60 MG/0.6ML ~~LOC~~ SOLN
55.0000 mg | SUBCUTANEOUS | Status: DC
  Administered 2019-12-10 – 2019-12-12 (×3): 55 mg via SUBCUTANEOUS
  Filled 2019-12-09 (×3): qty 0.6

## 2019-12-09 MED ORDER — SACUBITRIL-VALSARTAN 49-51 MG PO TABS
1.0000 | ORAL_TABLET | Freq: Two times a day (BID) | ORAL | Status: DC
  Administered 2019-12-09 – 2019-12-12 (×7): 1 via ORAL
  Filled 2019-12-09 (×7): qty 1

## 2019-12-09 MED ORDER — METOLAZONE 2.5 MG PO TABS
2.5000 mg | ORAL_TABLET | Freq: Every day | ORAL | Status: DC
  Administered 2019-12-09 – 2019-12-11 (×3): 2.5 mg via ORAL
  Filled 2019-12-09 (×3): qty 1

## 2019-12-09 MED ORDER — POTASSIUM CHLORIDE CRYS ER 10 MEQ PO TBCR
20.0000 meq | EXTENDED_RELEASE_TABLET | Freq: Two times a day (BID) | ORAL | Status: DC
  Administered 2019-12-09 – 2019-12-12 (×7): 20 meq via ORAL
  Filled 2019-12-09 (×11): qty 2

## 2019-12-09 MED ORDER — VERAPAMIL HCL 2.5 MG/ML IV SOLN
INTRAVENOUS | Status: DC | PRN
  Administered 2019-12-09: 10 mL via INTRA_ARTERIAL

## 2019-12-09 MED ORDER — SODIUM CHLORIDE 0.9% FLUSH: 3.0000 mL | INTRAVENOUS | Status: DC | PRN

## 2019-12-09 SURGICAL SUPPLY — 10 items
CATH 5FR JL3.5 JR4 ANG PIG MP (CATHETERS) ×1 IMPLANT
CATH BALLN WEDGE 5F 110CM (CATHETERS) ×1 IMPLANT
DEVICE RAD COMP TR BAND LRG (VASCULAR PRODUCTS) ×1 IMPLANT
GLIDESHEATH SLEND SS 6F .021 (SHEATH) ×1 IMPLANT
GUIDEWIRE INQWIRE 1.5J.035X260 (WIRE) IMPLANT
INQWIRE 1.5J .035X260CM (WIRE) ×2
KIT HEART LEFT (KITS) ×1 IMPLANT
PACK CARDIAC CATHETERIZATION (CUSTOM PROCEDURE TRAY) ×2 IMPLANT
SHEATH GLIDE SLENDER 4/5FR (SHEATH) ×1 IMPLANT
TRANSDUCER W/STOPCOCK (MISCELLANEOUS) ×2 IMPLANT

## 2019-12-09 NOTE — Interval H&P Note (Signed)
History and Physical Interval Note:  12/09/2019 7:54 AM  Maria Duncan  has presented today for surgery, with the diagnosis of NSTEMI.  The various methods of treatment have been discussed with the patient and family. After consideration of risks, benefits and other options for treatment, the patient has consented to  Procedure(s): RIGHT/LEFT HEART CATH AND CORONARY ANGIOGRAPHY (N/A) and possible coronary angioplasty as a surgical intervention.  The patient's history has been reviewed, patient examined, no change in status, stable for surgery.  I have reviewed the patient's chart and labs.  Questions were answered to the patient's satisfaction.     Laray Rivkin

## 2019-12-09 NOTE — Progress Notes (Signed)
Progress Note  Patient Name: Maria Duncan Date of Encounter: 12/09/2019  Primary Cardiologist: Crescent Gotham , [place].   Subjective    54 yo female with hx of morbid obesity,  Obesity hypoventilation, OSA, HTN  admitted with acute on chronic CHF  Cath earlier today revealed mild coronary artery irregularities. Ejection fractions around 25%.  Pulmonary artery pressures were 63/28 with a mean of 38.  Pulmonary capillary wedge pressure is 21. Cardiac output is 5.2 with an index of 2.4.  PVR is 3.2 Wood units. Arterial blood gas in the Cath Lab revealed a pH of 7.27, PCO2 of 88 with an O2 of 81.  She is feeling better .  We discussed post cath instructions   Inpatient Medications    Scheduled Meds: . aspirin EC  81 mg Oral Daily  . docusate sodium  100 mg Oral BID  . [START ON 12/10/2019] enoxaparin (LOVENOX) injection  55 mg Subcutaneous Q24H  . furosemide  80 mg Intravenous Q12H  . metolazone  2.5 mg Oral Daily  . potassium chloride  20 mEq Oral BID  . sacubitril-valsartan  1 tablet Oral BID  . sodium chloride flush  3 mL Intravenous Q12H  . sodium chloride flush  3 mL Intravenous Q12H  . sodium chloride flush  3 mL Intravenous Q12H  . spironolactone  25 mg Oral Daily   Continuous Infusions: . sodium chloride     PRN Meds: sodium chloride, acetaminophen, hydrALAZINE, HYDROcodone-acetaminophen, labetalol, ondansetron **OR** ondansetron (ZOFRAN) IV, ondansetron (ZOFRAN) IV, sodium chloride flush   Vital Signs    Vitals:   12/09/19 1110 12/09/19 1130 12/09/19 1154 12/09/19 1155  BP: 108/82 (!) 125/34 116/89 (!) 106/91  Pulse: 78 74 82 83  Resp: 19 16 20    Temp:   98.1 F (36.7 C)   TempSrc:   Oral   SpO2: 99% 96% 92%   Weight:      Height:        Intake/Output Summary (Last 24 hours) at 12/09/2019 1308 Last data filed at 12/09/2019 1140 Gross per 24 hour  Intake 131.56 ml  Output 3750 ml  Net -3618.44 ml   Last 3 Weights 12/09/2019 12/08/2019 12/07/2019  Weight (lbs)  247 lb 12.8 oz 252 lb 249 lb  Weight (kg) 112.401 kg 114.306 kg 112.946 kg      Telemetry    NSR  - Personally Reviewed  ECG     - Personally Reviewed  Physical Exam   GEN:   Pleasant , obese black female,  nad  Neck: No JVD Cardiac: RRR, no murmurs, rubs, or gallops.  Respiratory: Clear to auscultation bilaterally. GI: Soft, nontender, non-distended  MS: No edema; No deformity. Neuro:  Nonfocal  Psych: Normal affect   Labs    High Sensitivity Troponin:   Recent Labs  Lab 12/07/19 1827 12/07/19 2012  TROPONINIHS 54* 52*      Chemistry Recent Labs  Lab 12/07/19 1827 12/07/19 1827 12/08/19 0435 12/09/19 0806 12/09/19 0815 12/09/19 0827 12/09/19 1026  NA 140   < > 142   < > 142 141 141  K 4.7   < > 3.8   < > 3.5 3.8 3.5  CL 100  --  98  --   --   --   --   CO2 31  --  33*  --   --   --   --   GLUCOSE 120*  --  154*  --   --   --   --  BUN 11  --  10  --   --   --   --   CREATININE 1.02*  --  0.97  --   --   --   --   CALCIUM 9.4  --  9.3  --   --   --   --   PROT 6.9  --  6.6  --   --   --   --   ALBUMIN 3.6  --  3.4*  --   --   --   --   AST 37  --  30  --   --   --   --   ALT 88*  --  74*  --   --   --   --   ALKPHOS 89  --  82  --   --   --   --   BILITOT 0.7  --  1.0  --   --   --   --   GFRNONAA >60  --  >60  --   --   --   --   GFRAA >60  --  >60  --   --   --   --   ANIONGAP 9  --  11  --   --   --   --    < > = values in this interval not displayed.     Hematology Recent Labs  Lab 12/07/19 1827 12/07/19 1827 12/08/19 0435 12/09/19 0806 12/09/19 0815 12/09/19 0827 12/09/19 1026  WBC 6.7  --  5.9  --   --   --   --   RBC 5.02  --  4.82  --   --   --   --   HGB 14.0   < > 13.5   < > 13.9 14.6 15.3*  HCT 46.2*   < > 43.9   < > 41.0 43.0 45.0  MCV 92.0  --  91.1  --   --   --   --   MCH 27.9  --  28.0  --   --   --   --   MCHC 30.3  --  30.8  --   --   --   --   RDW 13.7  --  13.5  --   --   --   --   PLT 152  --  225  --   --   --    --    < > = values in this interval not displayed.    BNP Recent Labs  Lab 12/07/19 1827  BNP 862.0*     DDimer  Recent Labs  Lab 12/07/19 1827  DDIMER 0.99*     Radiology    DG Chest 2 View  Result Date: 12/07/2019 CLINICAL DATA:  Shortness of breath, swelling, fatigue and cough EXAM: CHEST - 2 VIEW COMPARISON:  CT 06/09/2016 FINDINGS: Low lung volumes with some atelectatic changes in the bases. More hazy interstitial and perihilar opacity with central cuffing and cephalized, indistinct vascularity could reflect early features of interstitial edema. Cardiac silhouette is enlarged when compared to prior exam. The aorta is calcified. The remaining cardiomediastinal contours are unremarkable. No acute osseous or soft tissue abnormality. Degenerative changes are present in the imaged spine and shoulders. Features of calcific tendinosis left shoulder. IMPRESSION: Findings suggestive of mild CHF in the given clinical setting. Electronically Signed   By: Kreg Shropshire M.D.   On: 12/07/2019 16:17   CT  Angio Chest PE W and/or Wo Contrast  Result Date: 12/07/2019 CLINICAL DATA:  Shortness of breath, elevated D-dimer, negative Doppler. EXAM: CT ANGIOGRAPHY CHEST WITH CONTRAST TECHNIQUE: Multidetector CT imaging of the chest was performed using the standard protocol during bolus administration of intravenous contrast. Multiplanar CT image reconstructions and MIPs were obtained to evaluate the vascular anatomy. CONTRAST:  OMNIPAQUE IOHEXOL 350 MG/ML SOLN COMPARISON:  CT 06/10/2016 FINDINGS: Cardiovascular: Satisfactory opacification the pulmonary arteries to the segmental level. No pulmonary artery filling defects are identified. Central pulmonary arteries are normal caliber. Cardiomegaly is similar to comparison. There is trace pericardial effusion. Poor opacification of the thoracic aorta limits evaluation of the lumen. The aorta is normal caliber. Shared origin of the brachiocephalic and left  common carotid arteries. Mediastinum/Nodes: Fatty stippling of the anterior mediastinum may reflect thymic remnant, unchanged since 2017. No mediastinal fluid or gas. Normal thyroid gland and thoracic inlet. No acute abnormality of the trachea or esophagus. No worrisome mediastinal, hilar or axillary adenopathy. Lungs/Pleura: Hypoventilatory changes are likely accentuated by imaging during exhalation for this angiographic technique. No consolidation, features of edema, pneumothorax, or effusion. No suspicious pulmonary nodules or masses. Upper Abdomen: No acute abnormalities present in the visualized portions of the upper abdomen. Musculoskeletal: Multilevel degenerative changes are present in the imaged portions of the spine. No acute osseous abnormality or suspicious osseous lesion. Review of the MIP images confirms the above findings. IMPRESSION: 1. No acute pulmonary artery filling defects to suggest pulmonary embolism. 2. Stable cardiomegaly.  Trace pericardial effusion. 3. Hypoventilatory changes, likely accentuated by imaging during exhalation for this angiographic technique. Electronically Signed   By: Kreg Shropshire M.D.   On: 12/07/2019 20:22   CARDIAC CATHETERIZATION  Result Date: 12/09/2019  Prox LAD lesion is 20% stenosed.  Findings: Ao = 131/86 (109) LV = 138/29 RA =   9 RV = 58/15 PA = 63/23 (38) PCW = 21 Fick cardiac output/index = 5.2/2.4 PVR = 3.2 WU FA sat = 93% PA sat = 62%, 67% ABG: 7.27/88/81/93% Assessment: 1. 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 Severe NICM EF 25% suspect due to HTN and undiagnosed OSA 3. Elevated filling pressures with normal cardiac output 4. OHS/OSA with CO2 retention (patient received 2mg  versed and fentanyl during cath) Plan/Discussion: Increase lasix to 80 IV bid and add metolazone (may need short course of Diamox). 80 IV lasix given in cath lab.  Start Entresto (had nausea with losartan so should be ok). Will need aggressive BP control and initiation of  CPAP in house. Patient awake and talking despite PCO2 ~90. ABG nearly compensated so this is chronic. Will take to holding area and BIPAP briefly until pCO2 < 80. HF team happy to assist with care as needed. , MD 8:33 AM   ECHOCARDIOGRAM COMPLETE  Result Date: 12/08/2019    ECHOCARDIOGRAM REPORT   Patient Name:   Maria Duncan Date of Exam: 12/08/2019 Medical Rec #:  12/10/2019    Height:       64.0 in Accession #:    [id number]   Weight:       252.0 lb Date of Birth:  13-Jun-1966    BSA:          2.158 m Patient Age:    53 years     BP:           141/96 mmHg Patient Gender: F            HR:  90 bpm. Exam Location:  Inpatient Procedure: 2D Echo, 3D Echo, Cardiac Doppler and Color Doppler Indications:    Elevated troponin  History:        Patient has no prior history of Echocardiogram examinations.                 Signs/Symptoms:Chest Pain. Cough.  Sonographer:    Roosvelt Maser RDCS Referring Phys: [id number] ANASTASSIA DOUTOVA IMPRESSIONS  1. Left ventricular ejection fraction, by estimation, is 35 to 40%. The left ventricle has moderately decreased function. The left ventricle demonstrates global hypokinesis. There is moderate left ventricular hypertrophy. Left ventricular diastolic parameters are consistent with Grade I diastolic dysfunction (impaired relaxation).  2. Right ventricular systolic function is moderately reduced. The right ventricular size is normal. There is moderately elevated pulmonary artery systolic pressure. The estimated right ventricular systolic pressure is 50.5 mmHg.  3. Left atrial size was mildly dilated.  4. Right atrial size was mildly dilated.  5. The mitral valve is grossly normal. Trivial mitral valve regurgitation.  6. The aortic valve is tricuspid. Aortic valve regurgitation is not visualized. Mild aortic valve sclerosis is present, with no evidence of aortic valve stenosis.  7. The inferior vena cava is dilated in size with >50% respiratory variability, suggesting  right atrial pressure of 8 mmHg. FINDINGS  Left Ventricle: Left ventricular ejection fraction, by estimation, is 35 to 40%. The left ventricle has moderately decreased function. The left ventricle demonstrates global hypokinesis. The left ventricular internal cavity size was normal in size. There is moderate left ventricular hypertrophy. Left ventricular diastolic parameters are consistent with Grade I diastolic dysfunction (impaired relaxation). Right Ventricle: The right ventricular size is normal. No increase in right ventricular wall thickness. Right ventricular systolic function is moderately reduced. There is moderately elevated pulmonary artery systolic pressure. The tricuspid regurgitant velocity is 3.26 m/s, and with an assumed right atrial pressure of 8 mmHg, the estimated right ventricular systolic pressure is 50.5 mmHg. Left Atrium: Left atrial size was mildly dilated. Right Atrium: Right atrial size was mildly dilated. Pericardium: There is no evidence of pericardial effusion. Mitral Valve: The mitral valve is grossly normal. Trivial mitral valve regurgitation. Tricuspid Valve: The tricuspid valve is grossly normal. Tricuspid valve regurgitation is mild. Aortic Valve: The aortic valve is tricuspid. Aortic valve regurgitation is not visualized. Mild aortic valve sclerosis is present, with no evidence of aortic valve stenosis. Moderate aortic valve annular calcification. Pulmonic Valve: The pulmonic valve was grossly normal. Pulmonic valve regurgitation is trivial. Aorta: The aortic root is normal in size and structure. Venous: The inferior vena cava is dilated in size with greater than 50% respiratory variability, suggesting right atrial pressure of 8 mmHg. IAS/Shunts: No atrial level shunt detected by color flow Doppler.  LEFT VENTRICLE PLAX 2D LVIDd:         4.70 cm      Diastology LVIDs:         3.80 cm      LV e' lateral:   8.59 cm/s LV PW:         1.30 cm      LV E/e' lateral: 8.0 LV IVS:         1.40 cm      LV e' medial:    6.30 cm/s LVOT diam:     2.00 cm      LV E/e' medial:  10.9 LV SV:         56 LV SV Index:   26 LVOT Area:  3.14 cm                              3D Volume EF: LV Volumes (MOD)            3D EF:        39 % LV vol d, MOD A2C: 129.0 ml LV EDV:       220 ml LV vol d, MOD A4C: 164.0 ml LV ESV:       134 ml LV vol s, MOD A2C: 68.5 ml  LV SV:        86 ml LV vol s, MOD A4C: 101.0 ml LV SV MOD A2C:     60.5 ml LV SV MOD A4C:     164.0 ml LV SV MOD BP:      61.1 ml RIGHT VENTRICLE            IVC RV Basal diam:  5.60 cm    IVC diam: 2.20 cm RV Mid diam:    5.20 cm RV S prime:     7.99 cm/s TAPSE (M-mode): 1.8 cm LEFT ATRIUM           Index       RIGHT ATRIUM           Index LA diam:      5.30 cm 2.46 cm/m  RA Area:     26.30 cm LA Vol (A4C): 83.2 ml 38.55 ml/m RA Volume:   100.00 ml 46.33 ml/m  AORTIC VALVE LVOT Vmax:   124.00 cm/s LVOT Vmean:  79.800 cm/s LVOT VTI:    0.179 m  AORTA Ao Root diam: 3.20 cm Ao Asc diam:  3.20 cm MITRAL VALVE               TRICUSPID VALVE MV Area (PHT): 3.99 cm    TR Peak grad:   42.5 mmHg MV Decel Time: 190 msec    TR Vmax:        326.00 cm/s MV E velocity: 68.60 cm/s MV A velocity: 91.70 cm/s  SHUNTS MV E/A ratio:  0.75        Systemic VTI:  0.18 m                            Systemic Diam: 2.00 cm Nona Dell MD Electronically signed by Nona Dell MD Signature Date/Time: 12/08/2019/12:52:55 PM    Final    VAS [place] LOWER EXTREMITY VENOUS (DVT) ([facility] and [facility] 7a-7p)  Result Date: 12/08/2019  Lower Venous DVTStudy Indications: Swelling, and SOB.  Comparison Study: No prior study on file for comparison Performing Technologist: Sherren Kerns RVS  Examination Guidelines: A complete evaluation includes B-mode imaging, spectral Doppler, color Doppler, and power Doppler as needed of all accessible portions of each vessel. Bilateral testing is considered an integral part of a complete examination. Limited examinations for reoccurring indications may be  performed as noted. The reflux portion of the exam is performed with the patient in reverse Trendelenburg.  +---------+---------------+---------+-----------+----------+--------------+ RIGHT    CompressibilityPhasicitySpontaneityPropertiesThrombus Aging +---------+---------------+---------+-----------+----------+--------------+ CFV      Full           Yes      Yes                                 +---------+---------------+---------+-----------+----------+--------------+ SFJ  Full                                                        +---------+---------------+---------+-----------+----------+--------------+ FV Prox  Full                                                        +---------+---------------+---------+-----------+----------+--------------+ FV Mid   Full                                                        +---------+---------------+---------+-----------+----------+--------------+ FV DistalFull                                                        +---------+---------------+---------+-----------+----------+--------------+ PFV      Full                                                        +---------+---------------+---------+-----------+----------+--------------+ POP      Full           Yes      Yes                                 +---------+---------------+---------+-----------+----------+--------------+ PTV      Full                                                        +---------+---------------+---------+-----------+----------+--------------+ PERO     Full                                                        +---------+---------------+---------+-----------+----------+--------------+   +---------+---------------+---------+-----------+----------+--------------+ LEFT     CompressibilityPhasicitySpontaneityPropertiesThrombus Aging +---------+---------------+---------+-----------+----------+--------------+ CFV      Full            Yes      Yes                                 +---------+---------------+---------+-----------+----------+--------------+ SFJ      Full                                                        +---------+---------------+---------+-----------+----------+--------------+  FV Prox  Full                                                        +---------+---------------+---------+-----------+----------+--------------+ FV Mid   Full                                                        +---------+---------------+---------+-----------+----------+--------------+ FV DistalFull                                                        +---------+---------------+---------+-----------+----------+--------------+ PFV      Full                                                        +---------+---------------+---------+-----------+----------+--------------+ POP      Full           Yes      Yes                                 +---------+---------------+---------+-----------+----------+--------------+ PTV      Full                                                        +---------+---------------+---------+-----------+----------+--------------+ PERO     Full                                                        +---------+---------------+---------+-----------+----------+--------------+     Summary: BILATERAL: - No evidence of deep vein thrombosis seen in the lower extremities, bilaterally. -   *See table(s) above for measurements and observations. Electronically signed by Ruta Hinds MD on 12/08/2019 at 8:50:08 AM.    Final     Cardiac Studies      Patient Profile     54 y.o. female with obesity, HTN, OSA ( not formally diagnosed yet) and acute on chronic CHF  Assessment & Plan    1.   CHF   :   Has a nonischemic CM .  Her CHF appears to be due to obesity and undiagnosed obstructive sleep apnea as well as hypertension.  We will continue aggressive treatment  for her hypertension and reduced LV function.  We had a long discussion about improving her diet.  2.  Pulmonary hypertension: This is likely due to her obesity hypoventilation as well as obstructive sleep apnea.  She will need a sleep study as an outpatient.  3.  Hypertension: We discussed  salt restriction at great length.  4.  Chest pain: She does not have any significant coronary artery disease.  5.  Morbid obesity: She wants to get involved with the [facility] nutrition classes.       For questions or updates, please contact [facility] Please consult [email or web address] for contact info under        Signed, Kristeen Miss, MD  12/09/2019, 1:08 PM

## 2019-12-09 NOTE — Progress Notes (Signed)
RT called to cath lab to put patient on BiPAP per ABG. We waited until she got to holding to place BiPAP. MD does not anticipate patient needing BiPAP more than one hour, patient already more awake and alert. Will continue to monitor.

## 2019-12-10 DIAGNOSIS — I50814 Right heart failure due to left heart failure: Secondary | ICD-10-CM

## 2019-12-10 LAB — BASIC METABOLIC PANEL
Anion gap: 15 (ref 5–15)
BUN: 23 mg/dL — ABNORMAL HIGH (ref 6–20)
CO2: 35 mmol/L — ABNORMAL HIGH (ref 22–32)
Calcium: 9.4 mg/dL (ref 8.9–10.3)
Chloride: 87 mmol/L — ABNORMAL LOW (ref 98–111)
Creatinine, Ser: 1.83 mg/dL — ABNORMAL HIGH (ref 0.44–1.00)
GFR calc Af Amer: 36 mL/min — ABNORMAL LOW (ref >60)
GFR calc non Af Amer: 31 mL/min — ABNORMAL LOW (ref >60)
Glucose, Bld: 162 mg/dL — ABNORMAL HIGH (ref 70–99)
Potassium: 4.2 mmol/L (ref 3.5–5.1)
Sodium: 137 mmol/L (ref 135–145)

## 2019-12-10 MED ORDER — FUROSEMIDE 10 MG/ML IJ SOLN
40.0000 mg | Freq: Every day | INTRAMUSCULAR | Status: DC
  Administered 2019-12-11: 40 mg via INTRAVENOUS
  Filled 2019-12-10: qty 4

## 2019-12-10 MED ORDER — SODIUM CHLORIDE 0.9 % IV SOLN: INTRAVENOUS | Status: AC

## 2019-12-10 NOTE — Progress Notes (Signed)
Heart Failure Stewardship Pharmacist Progress Note   PCP: Patient, No Pcp Per PCP-Cardiologist: No primary care provider on file.    HPI:  54 yo female with PMH significant for HTN and obesity admitted on 12/07/19 for shortness of breath and leg swelling over the last 2 weeks. She believes she has gained about 30 or more pounds in the past year, but isn't sure whether she has gained more in the past few weeks. She eats a poor diet with lots of snack/junk food. She was previously on medication for HTN, but she stopped this as she said she developed a cough.  ECHO completed on 12/08/19 and found to have LVEF 35-40%.   R/LHC on 12/09/19 with minimally obstructive CAD and elevated filling pressures with normal cardiac output. RA 9, PA 38, PCWP 21, CO/CI 5.2/2.4  Patient has been receiving IV diuresis + metolazone + diamox for volume overload. She is down 13 lbs since admission.  Current HF Medications: Furosemide 40 mg IV daily Metolazone 2.5 mg daily Entresto 49/51 mg BID Spironolactone 25 mg daily  Prior to admission HF Medications: None  Pertinent Lab Values: . Serum creatinine 1.83, BUN 23, Potassium 4.2, Sodium 137, BNP 862   Vital Signs: . Weight: 239 lbs (estimated dry weight: 235 lbs) . Blood pressure: 100/60s  . Heart rate: 80-110s   Medication Assistance / Insurance Benefits Check: Does the patient have prescription insurance?   Pending benefits check Type of insurance plan: pending  Does the patient qualify for medication assistance through manufacturers or grants? Pending  Eligible grants and/or patient assistance programs: Pending  Medication assistance applications in progress: None  Medication assistance applications approved: None  Approved medication assistance renewals will be completed by: TBD  Outpatient Pharmacy:  [facility] Is the patient willing to use [facility] TOC pharmacy at discharge?   Yes - pharmacy has been  updated Is the patient willing to transition their outpatient pharmacy to utilize a [facility] outpatient pharmacy?   TBD    Assessment: 1. Acute systolic CHF (EF 95%), due to HTN and undiagnosed OSA. NYHA class II/III symptoms. Volume status improved with IV diuresis. Weight down 13 lbs since admission.  - Continue furosemide 40 mg IV daily - reduced from 80 mg IV BID this AM with low BP - Continue metolazone 2.5 mg daily - Continue Entresto 49/51 mg BID - Continue spironolactone 25 mg daily - Consider adding Farxiga at discharge   Plan: 1) Medication changes recommended at this time: - None - BP soft with current regimen  2) Patient assistance application(s): - None pending  3)  Education  - To be completed prior to discharge   Danae Orleans, PharmD, BCPS Heart Failure Stewardship Pharmacist Phone [phone] 12/10/2019       9:56 AM

## 2019-12-10 NOTE — Progress Notes (Signed)
Patient received 80mg  IV lasix at 0545. Patient complains about "feeling funny" and left lower abdomen discomfort, patient's BP is 76/59 in the left arm, and 69/49 in the right arm. Paged and spoke to [facility]  and was told Nahser will round on the patient and to let the nurse know if the symptoms are worsening. Will continue to monitor.

## 2019-12-10 NOTE — Progress Notes (Signed)
Progress Note  Patient Name: Maria Duncan Date of Encounter: 12/10/2019  Primary Cardiologist: Jeanise Durfey , Allred.   Subjective    54 yo female with hx of morbid obesity,  Obesity hypoventilation, OSA, HTN  admitted with acute on chronic CHF  Cath earlier today revealed mild coronary artery irregularities. Ejection fractions around 25%.  Pulmonary artery pressures were 63/28 with a mean of 38.  Pulmonary capillary wedge pressure is 21. Cardiac output is 5.2 with an index of 2.4.  PVR is 3.2 Wood units. Arterial blood gas in the Cath Lab revealed a pH of 7.27, PCO2 of 88 with an O2 of 81.  She has diuresed a total of 9.1 liters during this admission She is hypotensive this am - largely because of her aggressive diuresis.  She is feeling better     Inpatient Medications    Scheduled Meds: . aspirin EC  81 mg Oral Daily  . docusate sodium  100 mg Oral BID  . enoxaparin (LOVENOX) injection  55 mg Subcutaneous Q24H  . furosemide  80 mg Intravenous Q12H  . metolazone  2.5 mg Oral Daily  . potassium chloride  20 mEq Oral BID  . sacubitril-valsartan  1 tablet Oral BID  . sodium chloride flush  3 mL Intravenous Q12H  . sodium chloride flush  3 mL Intravenous Q12H  . sodium chloride flush  3 mL Intravenous Q12H  . spironolactone  25 mg Oral Daily   Continuous Infusions: . sodium chloride     PRN Meds: sodium chloride, acetaminophen, HYDROcodone-acetaminophen, ondansetron **OR** ondansetron (ZOFRAN) IV, ondansetron (ZOFRAN) IV, sodium chloride flush   Vital Signs    Vitals:   12/09/19 2356 12/10/19 0454 12/10/19 0816 12/10/19 0854  BP: 100/62 (!) 105/56 (!) 77/66 (!) 88/67  Pulse:  78 (!) 102 92  Resp: 20 20 16 20   Temp: 98.2 F (36.8 C) (!) 97.4 F (36.3 C) 98.1 F (36.7 C) 97.9 F (36.6 C)  TempSrc: Oral Oral Oral   SpO2: 93% 91% 99% 95%  Weight:  108.4 kg    Height:        Intake/Output Summary (Last 24 hours) at 12/10/2019 0940 Last data filed at 12/10/2019  0857 Gross per 24 hour  Intake 580 ml  Output 5200 ml  Net -4620 ml   Last 3 Weights 12/10/2019 12/09/2019 12/08/2019  Weight (lbs) 239 lb 247 lb 12.8 oz 252 lb  Weight (kg) 108.41 kg 112.401 kg 114.306 kg      Telemetry    NSR  - Personally Reviewed  ECG     - Personally Reviewed  Physical Exam   Physical Exam: Blood pressure (!) 88/67, pulse 92, temperature 97.9 F (36.6 C), resp. rate 20, height 5\' 4"  (1.626 m), weight 108.4 kg, SpO2 95 %.  GEN:  Middle age female,  Moderately obese  HEENT: Normal NECK: No JVD; No carotid bruits LYMPHATICS: No lymphadenopathy CARDIAC:  RR  RESPIRATORY:  Clear to auscultation without rales, wheezing or rhonchi  ABDOMEN: Soft, non-tender, non-distended MUSCULOSKELETAL:  No edema  SKIN: Warm and dry NEUROLOGIC:  Alert and oriented x 3   Labs    High Sensitivity Troponin:   Recent Labs  Lab 12/07/19 1827 12/07/19 2012  TROPONINIHS 54* 52*      Chemistry Recent Labs  Lab 12/07/19 1827 12/07/19 1827 12/08/19 0435 12/09/19 0806 12/09/19 0815 12/09/19 0827 12/09/19 1026  NA 140   < > 142   < > 142 141 141  K 4.7   < >  3.8   < > 3.5 3.8 3.5  CL 100  --  98  --   --   --   --   CO2 31  --  33*  --   --   --   --   GLUCOSE 120*  --  154*  --   --   --   --   BUN 11  --  10  --   --   --   --   CREATININE 1.02*  --  0.97  --   --   --   --   CALCIUM 9.4  --  9.3  --   --   --   --   PROT 6.9  --  6.6  --   --   --   --   ALBUMIN 3.6  --  3.4*  --   --   --   --   AST 37  --  30  --   --   --   --   ALT 88*  --  74*  --   --   --   --   ALKPHOS 89  --  82  --   --   --   --   BILITOT 0.7  --  1.0  --   --   --   --   GFRNONAA >60  --  >60  --   --   --   --   GFRAA >60  --  >60  --   --   --   --   ANIONGAP 9  --  11  --   --   --   --    < > = values in this interval not displayed.     Hematology Recent Labs  Lab 12/07/19 1827 12/07/19 1827 12/08/19 0435 12/09/19 0806 12/09/19 0815 12/09/19 0827 12/09/19 1026   WBC 6.7  --  5.9  --   --   --   --   RBC 5.02  --  4.82  --   --   --   --   HGB 14.0   < > 13.5   < > 13.9 14.6 15.3*  HCT 46.2*   < > 43.9   < > 41.0 43.0 45.0  MCV 92.0  --  91.1  --   --   --   --   MCH 27.9  --  28.0  --   --   --   --   MCHC 30.3  --  30.8  --   --   --   --   RDW 13.7  --  13.5  --   --   --   --   PLT 152  --  225  --   --   --   --    < > = values in this interval not displayed.    BNP Recent Labs  Lab 12/07/19 1827  BNP 862.0*     DDimer  Recent Labs  Lab 12/07/19 1827  DDIMER 0.99*     Radiology    CARDIAC CATHETERIZATION  Result Date: 12/09/2019  Prox LAD lesion is 20% stenosed.  Findings: Ao = 131/86 (109) LV = 138/29 RA =   9 RV = 58/15 PA = 63/23 (38) PCW = 21 Fick cardiac output/index = 5.2/2.4 PVR = 3.2 WU FA sat = 93% PA sat = 62%, 67% ABG: 7.27/88/81/93% Assessment: 1. 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 Severe NICM EF 25% suspect due to HTN and undiagnosed OSA 3. Elevated filling pressures with normal cardiac output 4. OHS/OSA with CO2 retention (patient received 2mg  versed and fentanyl during cath) Plan/Discussion: Increase lasix to 80 IV bid and add metolazone (may need short course of Diamox). 80 IV lasix given in cath lab.  Start Entresto (had nausea with losartan so should be ok). Will need aggressive BP control and initiation of CPAP in house. Patient awake and talking despite PCO2 ~90. ABG nearly compensated so this is chronic. Will take to holding area and BIPAP briefly until pCO2 < 80. HF team happy to assist with care as needed. , MD 8:33 AM   ECHOCARDIOGRAM COMPLETE  Result Date: 12/08/2019    ECHOCARDIOGRAM REPORT   Patient Name:   Maria Duncan Date of Exam: 12/08/2019 Medical Rec #:  12/10/2019    Height:       64.0 in Accession #:    [id number]   Weight:       252.0 lb Date of Birth:  1966-02-27    BSA:          2.158 m Patient Age:    53 years     BP:           141/96 mmHg Patient Gender: F             HR:           90 bpm. Exam Location:  Inpatient Procedure: 2D Echo, 3D Echo, Cardiac Doppler and Color Doppler Indications:    Elevated troponin  History:        Patient has no prior history of Echocardiogram examinations.                 Signs/Symptoms:Chest Pain. Cough.  Sonographer:    14/10/1965 RDCS Referring Phys: Roosvelt Maser ANASTASSIA DOUTOVA IMPRESSIONS  1. Left ventricular ejection fraction, by estimation, is 35 to 40%. The left ventricle has moderately decreased function. The left ventricle demonstrates global hypokinesis. There is moderate left ventricular hypertrophy. Left ventricular diastolic parameters are consistent with Grade I diastolic dysfunction (impaired relaxation).  2. Right ventricular systolic function is moderately reduced. The right ventricular size is normal. There is moderately elevated pulmonary artery systolic pressure. The estimated right ventricular systolic pressure is 50.5 mmHg.  3. Left atrial size was mildly dilated.  4. Right atrial size was mildly dilated.  5. The mitral valve is grossly normal. Trivial mitral valve regurgitation.  6. The aortic valve is tricuspid. Aortic valve regurgitation is not visualized. Mild aortic valve sclerosis is present, with no evidence of aortic valve stenosis.  7. The inferior vena cava is dilated in size with >50% respiratory variability, suggesting right atrial pressure of 8 mmHg. FINDINGS  Left Ventricle: Left ventricular ejection fraction, by estimation, is 35 to 40%. The left ventricle has moderately decreased function. The left ventricle demonstrates global hypokinesis. The left ventricular internal cavity size was normal in size. There is moderate left ventricular hypertrophy. Left ventricular diastolic parameters are consistent with Grade I diastolic dysfunction (impaired relaxation). Right Ventricle: The right ventricular size is normal. No increase in right ventricular wall thickness. Right ventricular systolic function is moderately  reduced. There is moderately elevated pulmonary artery systolic pressure. The tricuspid regurgitant velocity is 3.26 m/s, and with an assumed right atrial pressure of 8 mmHg, the estimated right ventricular systolic pressure is 50.5 mmHg. Left Atrium: Left atrial size was mildly dilated. Right Atrium: Right atrial size was  mildly dilated. Pericardium: There is no evidence of pericardial effusion. Mitral Valve: The mitral valve is grossly normal. Trivial mitral valve regurgitation. Tricuspid Valve: The tricuspid valve is grossly normal. Tricuspid valve regurgitation is mild. Aortic Valve: The aortic valve is tricuspid. Aortic valve regurgitation is not visualized. Mild aortic valve sclerosis is present, with no evidence of aortic valve stenosis. Moderate aortic valve annular calcification. Pulmonic Valve: The pulmonic valve was grossly normal. Pulmonic valve regurgitation is trivial. Aorta: The aortic root is normal in size and structure. Venous: The inferior vena cava is dilated in size with greater than 50% respiratory variability, suggesting right atrial pressure of 8 mmHg. IAS/Shunts: No atrial level shunt detected by color flow Doppler.  LEFT VENTRICLE PLAX 2D LVIDd:         4.70 cm      Diastology LVIDs:         3.80 cm      LV e' lateral:   8.59 cm/s LV PW:         1.30 cm      LV E/e' lateral: 8.0 LV IVS:        1.40 cm      LV e' medial:    6.30 cm/s LVOT diam:     2.00 cm      LV E/e' medial:  10.9 LV SV:         56 LV SV Index:   26 LVOT Area:     3.14 cm                              3D Volume EF: LV Volumes (MOD)            3D EF:        39 % LV vol d, MOD A2C: 129.0 ml LV EDV:       220 ml LV vol d, MOD A4C: 164.0 ml LV ESV:       134 ml LV vol s, MOD A2C: 68.5 ml  LV SV:        86 ml LV vol s, MOD A4C: 101.0 ml LV SV MOD A2C:     60.5 ml LV SV MOD A4C:     164.0 ml LV SV MOD BP:      61.1 ml RIGHT VENTRICLE            IVC RV Basal diam:  5.60 cm    IVC diam: 2.20 cm RV Mid diam:    5.20 cm RV S prime:      7.99 cm/s TAPSE (M-mode): 1.8 cm LEFT ATRIUM           Index       RIGHT ATRIUM           Index LA diam:      5.30 cm 2.46 cm/m  RA Area:     26.30 cm LA Vol (A4C): 83.2 ml 38.55 ml/m RA Volume:   100.00 ml 46.33 ml/m  AORTIC VALVE LVOT Vmax:   124.00 cm/s LVOT Vmean:  79.800 cm/s LVOT VTI:    0.179 m  AORTA Ao Root diam: 3.20 cm Ao Asc diam:  3.20 cm MITRAL VALVE               TRICUSPID VALVE MV Area (PHT): 3.99 cm    TR Peak grad:   42.5 mmHg MV Decel Time: 190 msec    TR Vmax:  326.00 cm/s MV E velocity: 68.60 cm/s MV A velocity: 91.70 cm/s  SHUNTS MV E/A ratio:  0.75        Systemic VTI:  0.18 m                            Systemic Diam: 2.00 cm Nona Dell MD Electronically signed by Nona Dell MD Signature Date/Time: 12/08/2019/12:52:55 PM    Final     Cardiac Studies      Patient Profile     54 y.o. female with obesity, HTN, OSA ( not formally diagnosed yet) and acute on chronic CHF  Assessment & Plan    1.   CHF   :   Has a nonischemic CM .   She has diuresed over 9 L over the past 2 days.  She is hypotensive so we will need to give her some normal saline today for blood pressure.  I reduce the dose of her Lasix.  At some point we will start her on Entresto but I think her blood pressure is too low to do that today.  We will also add in carvedilol as tolerated.   2.  Pulmonary hypertension: This is likely due to obstructive sleep apnea or obesity hypoventilation.  She will need a sleep study as an outpatient.  3.  Hypertension: We discussed salt restriction.  4.  Chest pain: She does not have any significant coronary artery disease.  5.  Morbid obesity: She wants to get involved with the [facility] nutrition classes.       For questions or updates, please contact [facility] Please consult [email or web address] for contact info under        Signed, Kristeen Miss, MD  12/10/2019, 9:40 AM

## 2019-12-11 ENCOUNTER — Encounter (HOSPITAL_COMMUNITY): Payer: Self-pay | Admitting: Internal Medicine

## 2019-12-11 DIAGNOSIS — I428 Other cardiomyopathies: Secondary | ICD-10-CM

## 2019-12-11 DIAGNOSIS — I5041 Acute combined systolic (congestive) and diastolic (congestive) heart failure: Secondary | ICD-10-CM | POA: Diagnosis present

## 2019-12-11 MED ORDER — TORSEMIDE 20 MG PO TABS
20.0000 mg | ORAL_TABLET | Freq: Every day | ORAL | Status: DC
  Administered 2019-12-12: 20 mg via ORAL
  Filled 2019-12-11 (×2): qty 1

## 2019-12-11 MED ORDER — PANTOPRAZOLE SODIUM 40 MG PO TBEC
40.0000 mg | DELAYED_RELEASE_TABLET | Freq: Every day | ORAL | Status: DC
  Administered 2019-12-11 – 2019-12-12 (×2): 40 mg via ORAL
  Filled 2019-12-11 (×2): qty 1

## 2019-12-11 MED ORDER — POLYETHYLENE GLYCOL 3350 17 G PO PACK
17.0000 g | PACK | Freq: Every day | ORAL | Status: DC | PRN
  Administered 2019-12-12 (×2): 17 g via ORAL
  Filled 2019-12-11 (×2): qty 1

## 2019-12-11 MED ORDER — METOPROLOL TARTRATE 12.5 MG HALF TABLET
12.5000 mg | ORAL_TABLET | Freq: Two times a day (BID) | ORAL | Status: DC
  Administered 2019-12-11 – 2019-12-12 (×2): 12.5 mg via ORAL
  Filled 2019-12-11 (×3): qty 1

## 2019-12-11 NOTE — Progress Notes (Signed)
Pt states no BM for a few days. MD paged for PRN laxative.

## 2019-12-11 NOTE — Progress Notes (Signed)
Heart Failure Stewardship Pharmacist Progress Note   PCP: Patient, No Pcp Per PCP-Cardiologist: No primary care provider on file.    HPI:  54 yo female with PMH significant for HTN and obesity admitted on 12/07/19 for shortness of breath and leg swelling over the last 2 weeks. She believes she has gained about 30 or more pounds in the past year, but isn't sure whether she has gained more in the past few weeks. She eats a poor diet with lots of snack/junk food. She was previously on medication for HTN, but she stopped this as she said she developed a cough.  ECHO completed on 12/08/19 and found to have LVEF 35-40%.   R/LHC on 12/09/19 with minimally obstructive CAD and elevated filling pressures with normal cardiac output. RA 9, PA 38, PCWP 21, CO/CI 5.2/2.4  She had been receiving IV diuresis + metolazone + diamox for volume overload. She is down 13 lbs since admission. This was transitioned to PO torsemide today.  Current HF Medications: Torsemide 20 mg daily KCl 20 mEq PO BID Entresto 49/51 mg BID Spironolactone 25 mg daily  Prior to admission HF Medications: None  Pertinent Lab Values: . Serum creatinine 1.83, BUN 23, Potassium 4.2, Sodium 137, BNP 862   Vital Signs: . Weight: 239 lbs (estimated dry weight: 235 lbs) . Blood pressure: 100/60s  . Heart rate: 80-110s   Medication Assistance / Insurance Benefits Check: Does the patient have prescription insurance?   Pending benefits check with case management Type of insurance plan: pending  Does the patient qualify for medication assistance through manufacturers or grants? Pending  Eligible grants and/or patient assistance programs: Pending  Medication assistance applications in progress: None  Medication assistance applications approved: None  Approved medication assistance renewals will be completed by: TBD  Outpatient Pharmacy:  [facility] Is the patient willing to use [facility] TOC  pharmacy at discharge?   Yes - pharmacy has been updated Is the patient willing to transition their outpatient pharmacy to utilize a [facility] outpatient pharmacy?   TBD    Assessment: 1. Acute systolic CHF (EF 20%), due to HTN and undiagnosed OSA. NYHA class II/III symptoms. Volume status improved with IV diuresis. Now transitioned to PO diuretics. Weight down 13 lbs since admission.  - Continue torsemide 20 mg daily  - Continue KCl 20 mEq PO daily - Consider adding low dose metoprolol today now euvolemic - Continue Entresto 49/51 mg BID - Continue spironolactone 25 mg daily - Consider adding Farxiga at discharge   Plan: 1) Medication changes recommended at this time: - Add low dose metoprolol   2) Patient assistance application(s): - None pending  3)  Education  - To be completed prior to discharge   Danae Orleans, PharmD, BCPS Heart Failure Stewardship Pharmacist Phone [phone] 12/11/2019       10:08 AM

## 2019-12-11 NOTE — Progress Notes (Signed)
Progress Note  Patient Name: Maria Duncan Date of Encounter: 12/11/2019  Primary Cardiologist: Makyna Niehoff , Allred.   Subjective    54 yo female with hx of morbid obesity,  Obesity hypoventilation, OSA, HTN  admitted with acute on chronic CHF  Cath earlier today revealed mild coronary artery irregularities. Ejection fractions around 25%.  Pulmonary artery pressures were 63/28 with a mean of 38.  Pulmonary capillary wedge pressure is 21. Cardiac output is 5.2 with an index of 2.4.  PVR is 3.2 Wood units. Arterial blood gas in the Cath Lab revealed a pH of 7.27, PCO2 of 88 with an O2 of 81.    Has diuresed 9.4 liters so far this admission BP is better.  She was hypotensive yesterday secondary to aggressive diuresis .      Inpatient Medications    Scheduled Meds: . aspirin EC  81 mg Oral Daily  . docusate sodium  100 mg Oral BID  . enoxaparin (LOVENOX) injection  55 mg Subcutaneous Q24H  . furosemide  40 mg Intravenous Daily  . metolazone  2.5 mg Oral Daily  . potassium chloride  20 mEq Oral BID  . sacubitril-valsartan  1 tablet Oral BID  . sodium chloride flush  3 mL Intravenous Q12H  . spironolactone  25 mg Oral Daily   Continuous Infusions:  PRN Meds: acetaminophen, HYDROcodone-acetaminophen, ondansetron **OR** ondansetron (ZOFRAN) IV, ondansetron (ZOFRAN) IV   Vital Signs    Vitals:   12/10/19 1639 12/10/19 1946 12/11/19 0446 12/11/19 0805  BP: 108/69 96/77 100/63 (!) 105/59  Pulse: 90 80 92 90  Resp: 20 20 20 18   Temp: 98.1 F (36.7 C) (!) 97.5 F (36.4 C) 97.8 F (36.6 C) 97.9 F (36.6 C)  TempSrc: Oral Oral Oral Oral  SpO2: 97% 100% 96% (!) 86%  Weight:   108.7 kg   Height:        Intake/Output Summary (Last 24 hours) at 12/11/2019 0905 Last data filed at 12/11/2019 0800 Gross per 24 hour  Intake 1373.15 ml  Output 1850 ml  Net -476.85 ml   Last 3 Weights 12/11/2019 12/10/2019 12/09/2019  Weight (lbs) 239 lb 11.2 oz 239 lb 247 lb 12.8 oz  Weight (kg)  108.727 kg 108.41 kg 112.401 kg      Telemetry      NSR - Personally Reviewed  ECG     - Personally Reviewed  Physical Exam   Physical Exam: Blood pressure (!) 105/59, pulse 90, temperature 97.9 F (36.6 C), temperature source Oral, resp. rate 18, height 5\' 4"  (1.626 m), weight 108.7 kg, SpO2 (!) 86 %.  GEN:  Well nourished, well developed in no acute distress HEENT: Normal NECK: No JVD; No carotid bruits LYMPHATICS: No lymphadenopathy CARDIAC: RRR , no murmurs, rubs, gallops RESPIRATORY:  Clear to auscultation without rales, wheezing or rhonchi  ABDOMEN: Soft, non-tender, non-distended MUSCULOSKELETAL:  No edema; No deformity  SKIN: Warm and dry NEUROLOGIC:  Alert and oriented x 3    Labs    High Sensitivity Troponin:   Recent Labs  Lab 12/07/19 1827 12/07/19 2012  TROPONINIHS 54* 52*      Chemistry Recent Labs  Lab 12/07/19 1827 12/07/19 1827 12/08/19 0435 12/09/19 0806 12/09/19 0827 12/09/19 1026 12/10/19 1023  NA 140   < > 142   < > 141 141 137  K 4.7   < > 3.8   < > 3.8 3.5 4.2  CL 100  --  98  --   --   --  87*  CO2 31  --  33*  --   --   --  35*  GLUCOSE 120*  --  154*  --   --   --  162*  BUN 11  --  10  --   --   --  23*  CREATININE 1.02*  --  0.97  --   --   --  1.83*  CALCIUM 9.4  --  9.3  --   --   --  9.4  PROT 6.9  --  6.6  --   --   --   --   ALBUMIN 3.6  --  3.4*  --   --   --   --   AST 37  --  30  --   --   --   --   ALT 88*  --  74*  --   --   --   --   ALKPHOS 89  --  82  --   --   --   --   BILITOT 0.7  --  1.0  --   --   --   --   GFRNONAA >60  --  >60  --   --   --  31*  GFRAA >60  --  >60  --   --   --  36*  ANIONGAP 9  --  11  --   --   --  15   < > = values in this interval not displayed.     Hematology Recent Labs  Lab 12/07/19 1827 12/07/19 1827 12/08/19 0435 12/09/19 0806 12/09/19 0815 12/09/19 0827 12/09/19 1026  WBC 6.7  --  5.9  --   --   --   --   RBC 5.02  --  4.82  --   --   --   --   HGB 14.0   < >  13.5   < > 13.9 14.6 15.3*  HCT 46.2*   < > 43.9   < > 41.0 43.0 45.0  MCV 92.0  --  91.1  --   --   --   --   MCH 27.9  --  28.0  --   --   --   --   MCHC 30.3  --  30.8  --   --   --   --   RDW 13.7  --  13.5  --   --   --   --   PLT 152  --  225  --   --   --   --    < > = values in this interval not displayed.    BNP Recent Labs  Lab 12/07/19 1827  BNP 862.0*     DDimer  Recent Labs  Lab 12/07/19 1827  DDIMER 0.99*     Radiology    No results found.  Cardiac Studies      Patient Profile     54 y.o. female with obesity, HTN, OSA ( not formally diagnosed yet) and acute on chronic CHF  Assessment & Plan    1.   CHF:    Doing well .   Will ambulate in the halls Will anticipate DC soon - perhaps tomorrow    2.  Pulmonary hypertension:   Due to obesity hypoventilation and possible OSA  Rec. Weight loss .  3.  Hypertension:    BP is well controlled.   4.  Chest pain:  5.  Morbid obesity:   Wants to go to nutrition classes.       For questions or updates, please contact [facility] Please consult [email or web address] for contact info under        Signed, Kristeen Miss, MD  12/11/2019, 9:05 AM

## 2019-12-11 NOTE — Progress Notes (Signed)
Patient complains about a dry non productive cough that worsens with activity throughout the day. Patient explains that it just started today and is concerned that it could be the entresto. Paged and spoked to [facility] and she is concerned that it could be an acid reflux so there is an order added for protonix. Maria Duncan would like another dose of entresto to be given and let the rounding team assess the medication tomorrow if the coughing persists.

## 2019-12-11 NOTE — Plan of Care (Signed)

## 2019-12-11 NOTE — Progress Notes (Signed)
   Patient has had intermittent coughing/nausea today.Sometimes after eating and then again tonight after ambulating in the halls. Patient concerned because she previously had similar symptoms when taking Losartan and had to stop taking it. She is wondering if this could be a side effect from [place]. Sherryll Burger was started on 5/17 and symptoms started today. It is possible that this could be a side effect but would like to continue Entresto for now. Since symptoms have sometimes occurred after eating, will try a dose of Protonix to see if that helps. Will defer decision about whether Sherryll Burger should be continued or not to rounding team tomorrow.  Corrin Parker, PA-C 12/11/2019 6:43 PM

## 2019-12-11 NOTE — TOC Progression Note (Addendum)
Transition of Care [facility]) - Progression Note    Patient Details  Name: Maria Duncan MRN: [id number] Date of Birth: February 09, 1966  Transition of Care [facility]) CM/SW Contact  Leone Haven, RN Phone Number: 12/11/2019, 1:18 PM  Clinical Narrative:    Patient is from home with spouse, she will need a follow up apt with [facility] clinic, she will need med ast with [facility] and [facility].  She will have transportation home at dc.  NCM spoke with [facility] HF pharmacist, she states they can help patient with the HF fund with her HF meds. She will be going to the HF clinic.  Patient would like a HHRN for CHF disease management, NCM made referral to [place] with [facility], she states she was approved for charity.  Will need HHRN order.         Expected Discharge Plan and Services                                                 Social Determinants of Health (SDOH) Interventions    Readmission Risk Interventions No flowsheet data found.

## 2019-12-12 ENCOUNTER — Encounter (HOSPITAL_COMMUNITY): Payer: Self-pay | Admitting: Internal Medicine

## 2019-12-12 DIAGNOSIS — I272 Pulmonary hypertension, unspecified: Secondary | ICD-10-CM

## 2019-12-12 DIAGNOSIS — G4733 Obstructive sleep apnea (adult) (pediatric): Secondary | ICD-10-CM

## 2019-12-12 DIAGNOSIS — E662 Morbid (severe) obesity with alveolar hypoventilation: Secondary | ICD-10-CM

## 2019-12-12 DIAGNOSIS — R079 Chest pain, unspecified: Secondary | ICD-10-CM

## 2019-12-12 DIAGNOSIS — I2510 Atherosclerotic heart disease of native coronary artery without angina pectoris: Secondary | ICD-10-CM | POA: Diagnosis present

## 2019-12-12 DIAGNOSIS — I5041 Acute combined systolic (congestive) and diastolic (congestive) heart failure: Secondary | ICD-10-CM

## 2019-12-12 HISTORY — DX: Pulmonary hypertension, unspecified: I27.20

## 2019-12-12 HISTORY — DX: Morbid (severe) obesity with alveolar hypoventilation: E66.2

## 2019-12-12 HISTORY — DX: Chest pain, unspecified: R07.9

## 2019-12-12 HISTORY — DX: Obstructive sleep apnea (adult) (pediatric): G47.33

## 2019-12-12 LAB — BASIC METABOLIC PANEL
Anion gap: 9 (ref 5–15)
BUN: 36 mg/dL — ABNORMAL HIGH (ref 6–20)
CO2: 36 mmol/L — ABNORMAL HIGH (ref 22–32)
Calcium: 9 mg/dL (ref 8.9–10.3)
Chloride: 93 mmol/L — ABNORMAL LOW (ref 98–111)
Creatinine, Ser: 1.5 mg/dL — ABNORMAL HIGH (ref 0.44–1.00)
GFR calc Af Amer: 46 mL/min — ABNORMAL LOW (ref >60)
GFR calc non Af Amer: 39 mL/min — ABNORMAL LOW (ref >60)
Glucose, Bld: 155 mg/dL — ABNORMAL HIGH (ref 70–99)
Potassium: 4.8 mmol/L (ref 3.5–5.1)
Sodium: 138 mmol/L (ref 135–145)

## 2019-12-12 MED ORDER — TORSEMIDE 20 MG PO TABS: 20.0000 mg | ORAL_TABLET | Freq: Every day | ORAL | 6 refills | Status: DC

## 2019-12-12 MED ORDER — POLYETHYLENE GLYCOL 3350 17 G PO PACK: 17.0000 g | PACK | Freq: Every day | ORAL | 0 refills | Status: AC | PRN

## 2019-12-12 MED ORDER — SACUBITRIL-VALSARTAN 24-26 MG PO TABS: 1.0000 | ORAL_TABLET | Freq: Two times a day (BID) | ORAL | 6 refills | Status: DC

## 2019-12-12 MED ORDER — ACETAMINOPHEN 325 MG PO TABS: 650.0000 mg | ORAL_TABLET | ORAL | Status: DC | PRN

## 2019-12-12 MED ORDER — MAGNESIUM CITRATE PO SOLN
1.0000 | Freq: Once | ORAL | Status: AC
  Administered 2019-12-12: 1 via ORAL
  Filled 2019-12-12: qty 296

## 2019-12-12 MED ORDER — SACUBITRIL-VALSARTAN 24-26 MG PO TABS: 1.0000 | ORAL_TABLET | Freq: Two times a day (BID) | ORAL | Status: DC

## 2019-12-12 MED ORDER — POTASSIUM CHLORIDE CRYS ER 20 MEQ PO TBCR: 20.0000 meq | EXTENDED_RELEASE_TABLET | Freq: Every day | ORAL | Status: DC

## 2019-12-12 MED ORDER — LIVING BETTER WITH HEART FAILURE BOOK: Freq: Once | Status: AC

## 2019-12-12 MED ORDER — SPIRONOLACTONE 25 MG PO TABS: 25.0000 mg | ORAL_TABLET | Freq: Every day | ORAL | 6 refills | Status: DC

## 2019-12-12 MED ORDER — SACUBITRIL-VALSARTAN 49-51 MG PO TABS: 1.0000 | ORAL_TABLET | Freq: Two times a day (BID) | ORAL | 6 refills | Status: DC

## 2019-12-12 MED ORDER — METOPROLOL TARTRATE 25 MG PO TABS: 12.5000 mg | ORAL_TABLET | Freq: Two times a day (BID) | ORAL | 6 refills | Status: DC

## 2019-12-12 MED ORDER — ASPIRIN 81 MG PO TBEC: 81.0000 mg | DELAYED_RELEASE_TABLET | Freq: Every day | ORAL | 6 refills | Status: DC

## 2019-12-12 MED ORDER — PANTOPRAZOLE SODIUM 40 MG PO TBEC: 40.0000 mg | DELAYED_RELEASE_TABLET | Freq: Every day | ORAL | 1 refills | Status: DC

## 2019-12-12 MED ORDER — DOCUSATE SODIUM 100 MG PO CAPS: 100.0000 mg | ORAL_CAPSULE | Freq: Two times a day (BID) | ORAL | 0 refills | Status: AC

## 2019-12-12 MED ORDER — POTASSIUM CHLORIDE CRYS ER 20 MEQ PO TBCR: 20.0000 meq | EXTENDED_RELEASE_TABLET | Freq: Every day | ORAL | 6 refills | Status: DC

## 2019-12-12 MED FILL — PANTOPRAZOLE SOD DR 40 MG T: 40 | 30 days supply | Qty: 30 | Fill #0

## 2019-12-12 MED FILL — ENTRESTO 24 MG-26 MG TABLET: 24-26 | 30 days supply | Qty: 60 | Fill #0

## 2019-12-12 MED FILL — POTASSIUM CHLORIDE 20meqER: 20 | 30 days supply | Qty: 30 | Fill #0

## 2019-12-12 MED FILL — ASPIRIN LOW DOSE 81 MG TBEC: 81 | 30 days supply | Qty: 30 | Fill #0

## 2019-12-12 MED FILL — POLYETHYLENE GLYCOL 3350 PO: 17 | 14 days supply | Qty: 238 | Fill #0

## 2019-12-12 MED FILL — TORSEMIDE 20 MG TABLET: 20 | 30 days supply | Qty: 30 | Fill #0

## 2019-12-12 MED FILL — DOCUSATE SODIUM 100 MG CAPS: 100 | 5 days supply | Qty: 10 | Fill #0

## 2019-12-12 MED FILL — METOPROLOL TARTRATE 25 MG T: 25 | 30 days supply | Qty: 30 | Fill #0

## 2019-12-12 MED FILL — SPIRONOLACTONE 25 MG TABLET: 25 | 30 days supply | Qty: 30 | Fill #0

## 2019-12-12 NOTE — Discharge Instructions (Signed)
Call [facility] at [phone] if any bleeding, swelling or drainage at cath site.  May shower, no tub baths for 48 hours for groin sticks. No lifting over 5 pounds for 3 days.  No Driving for 3 days  Heart Healthy Diabetic low salt diet.  No fast foods, they are high in salt.  No more than 2000 mg per day.   Weigh daily and if weight climbs more than 3 pounds in a day or 5 pounds in a week call the office.  Weight loss is important.  Look at [facility] Diet this will give some direction  DASH Eating Plan DASH stands for "Dietary Approaches to Stop Hypertension." The DASH eating plan is a healthy eating plan that has been shown to reduce high blood pressure (hypertension). It may also reduce your risk for type 2 diabetes, heart disease, and stroke. The DASH eating plan may also help with weight loss. What are tips for following this plan?  General guidelines  Avoid eating more than 2,300 mg (milligrams) of salt (sodium) a day. If you have hypertension, you may need to reduce your sodium intake to 1,500 mg a day.  Limit alcohol intake to no more than 1 drink a day for nonpregnant women and 2 drinks a day for men. One drink equals 12 oz of beer, 5 oz of wine, or 1 oz of hard liquor.  Work with your health care provider to maintain a healthy body weight or to lose weight. Ask what an ideal weight is for you.  Get at least 30 minutes of exercise that causes your heart to beat faster (aerobic exercise) most days of the week. Activities may include walking, swimming, or biking.  Work with your health care provider or diet and nutrition specialist (dietitian) to adjust your eating plan to your individual calorie needs. Reading food labels   Check food labels for the amount of sodium per serving. Choose foods with less than 5 percent of the Daily Value of sodium. Generally, foods with less than 300 mg of sodium per serving fit into this eating plan.  To find whole grains, look for  the word "whole" as the first word in the ingredient list. Shopping  Buy products labeled as "low-sodium" or "no salt added."  Buy fresh foods. Avoid canned foods and premade or frozen meals. Cooking  Avoid adding salt when cooking. Use salt-free seasonings or herbs instead of table salt or sea salt. Check with your health care provider or pharmacist before using salt substitutes.  Do not fry foods. Cook foods using healthy methods such as baking, boiling, grilling, and broiling instead.  Cook with heart-healthy oils, such as olive, canola, soybean, or sunflower oil. Meal planning  Eat a balanced diet that includes: ? 5 or more servings of fruits and vegetables each day. At each meal, try to fill half of your plate with fruits and vegetables. ? Up to 6-8 servings of whole grains each day. ? Less than 6 oz of lean meat, poultry, or fish each day. A 3-oz serving of meat is about the same size as a deck of cards. One egg equals 1 oz. ? 2 servings of low-fat dairy each day. ? A serving of nuts, seeds, or beans 5 times each week. ? Heart-healthy fats. Healthy fats called Omega-3 fatty acids are found in foods such as flaxseeds and coldwater fish, like sardines, salmon, and mackerel.  Limit how much you eat of the following: ? Canned or prepackaged foods. ?  Food that is high in trans fat, such as fried foods. ? Food that is high in saturated fat, such as fatty meat. ? Sweets, desserts, sugary drinks, and other foods with added sugar. ? Full-fat dairy products.  Do not salt foods before eating.  Try to eat at least 2 vegetarian meals each week.  Eat more home-cooked food and less restaurant, buffet, and fast food.  When eating at a restaurant, ask that your food be prepared with less salt or no salt, if possible. What foods are recommended? The items listed may not be a complete list. Talk with your dietitian about what dietary choices are best for you. Grains Whole-grain or  whole-wheat bread. Whole-grain or whole-wheat pasta. Brown rice. [place]. Bulgur. Whole-grain and low-sodium cereals. Pita bread. Low-fat, low-sodium crackers. Whole-wheat flour tortillas. Vegetables Fresh or frozen vegetables (raw, steamed, roasted, or grilled). Low-sodium or reduced-sodium tomato and vegetable juice. Low-sodium or reduced-sodium tomato sauce and tomato paste. Low-sodium or reduced-sodium canned vegetables. Fruits All fresh, dried, or frozen fruit. Canned fruit in natural juice (without added sugar). Meat and other protein foods Skinless chicken or [place]. Ground chicken or [place]. Pork with fat trimmed off. Fish and seafood. Egg whites. Dried beans, peas, or lentils. Unsalted nuts, nut butters, and seeds. Unsalted canned beans. Lean cuts of beef with fat trimmed off. Low-sodium, lean deli meat. Dairy Low-fat (1%) or fat-free (skim) milk. Fat-free, low-fat, or reduced-fat cheeses. Nonfat, low-sodium ricotta or cottage cheese. Low-fat or nonfat yogurt. Low-fat, low-sodium cheese. Fats and oils Soft margarine without trans fats. Vegetable oil. Low-fat, reduced-fat, or light mayonnaise and salad dressings (reduced-sodium). Canola, safflower, olive, soybean, and sunflower oils. Avocado. Seasoning and other foods Herbs. Spices. Seasoning mixes without salt. Unsalted popcorn and pretzels. Fat-free sweets. What foods are not recommended? The items listed may not be a complete list. Talk with your dietitian about what dietary choices are best for you. Grains Baked goods made with fat, such as croissants, muffins, or some breads. Dry pasta or rice meal packs. Vegetables Creamed or fried vegetables. Vegetables in a cheese sauce. Regular canned vegetables (not low-sodium or reduced-sodium). Regular canned tomato sauce and paste (not low-sodium or reduced-sodium). Regular tomato and vegetable juice (not low-sodium or reduced-sodium). Angie Fava. Olives. Fruits Canned fruit in a light  or heavy syrup. Fried fruit. Fruit in cream or butter sauce. Meat and other protein foods Fatty cuts of meat. Ribs. Fried meat. Berniece Salines. Sausage. Bologna and other processed lunch meats. Salami. Fatback. Hotdogs. Bratwurst. Salted nuts and seeds. Canned beans with added salt. Canned or smoked fish. Whole eggs or egg yolks. Chicken or [place] with skin. Dairy Whole or 2% milk, cream, and half-and-half. Whole or full-fat cream cheese. Whole-fat or sweetened yogurt. Full-fat cheese. Nondairy creamers. Whipped toppings. Processed cheese and cheese spreads. Fats and oils Butter. Stick margarine. Lard. Shortening. Ghee. Bacon fat. Tropical oils, such as coconut, palm kernel, or palm oil. Seasoning and other foods Salted popcorn and pretzels. Onion salt, garlic salt, seasoned salt, table salt, and sea salt. Worcestershire sauce. Tartar sauce. Barbecue sauce. Teriyaki sauce. Soy sauce, including reduced-sodium. Steak sauce. Canned and packaged gravies. Fish sauce. Oyster sauce. Cocktail sauce. Horseradish that you find on the shelf. Ketchup. Mustard. Meat flavorings and tenderizers. Bouillon cubes. Hot sauce and Tabasco sauce. Premade or packaged marinades. Premade or packaged taco seasonings. Relishes. Regular salad dressings. Where to find more information:  National Heart, Lung, and [place]: https://wilson-eaton.com/  American Heart Association: www.heart.org Summary  The DASH eating plan is  a healthy eating plan that has been shown to reduce high blood pressure (hypertension). It may also reduce your risk for type 2 diabetes, heart disease, and stroke.  With the DASH eating plan, you should limit salt (sodium) intake to 2,300 mg a day. If you have hypertension, you may need to reduce your sodium intake to 1,500 mg a day.  When on the DASH eating plan, aim to eat more fresh fruits and vegetables, whole grains, lean proteins, low-fat dairy, and heart-healthy fats.  Work with your health care provider or  diet and nutrition specialist (dietitian) to adjust your eating plan to your individual calorie needs. This information is not intended to replace advice given to you by your health care provider. Make sure you discuss any questions you have with your health care provider. Document Revised: 06/23/2017 Document Reviewed: 07/04/2016 Elsevier Patient Education  [employer]. .

## 2019-12-12 NOTE — Discharge Summary (Addendum)
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) (HCC) Active Problems:   CHF exacerbation (HCC)   Cardiomegaly   Essential hypertension   CHF (congestive heart failure) (HCC)   NICM (nonischemic cardiomyopathy) (HCC)   Morbid obesity (HCC)   Chest pain of uncertain etiology, non obstructive CAD, pain due to acute HF   Pulmonary hypertension, unspecified (HCC)   OSA (obstructive sleep apnea)   Hypoventilation associated with obesity (HCC)   CAD in native artery non obstructive on cath 12/09/19    Diagnostic Studies/Procedures   Rt and Lt cardiac cath 12/09/19  Findings:  Ao = 131/86 (109) LV = 138/29 RA =   9 RV = 58/15 PA = 63/23 (38) PCW = 21 Fick cardiac output/index = 5.2/2.4 PVR = 3.2 WU FA sat = 93% PA sat = 62%, 67%  ABG: 7.27/88/81/93%  Assessment: 1. 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 Severe NICM EF 25% suspect due to HTN and undiagnosed OSA 3. Elevated filling pressures with normal cardiac output 4. OHS/OSA with CO2 retention (patient received 2mg  versed and fentanyl during cath)  Plan/Discussion:  Increase lasix to 80 IV bid and add metolazone (may need short course of Diamox). 80 IV lasix given in cath lab.  Start Entresto (had nausea with losartan so should be ok). Will need aggressive BP control and initiation of CPAP in house.  Patient awake and talking despite PCO2 ~90. ABG nearly compensated so this is chronic. Will take to holding area and BIPAP briefly until pCO2 < 80.   HF team happy to assist with care as needed.   , MD  8:33 AM  Echo 12/08/19 IMPRESSIONS    1. Left ventricular ejection  fraction, by estimation, is 35 to 40%. The  left ventricle has moderately decreased function. The left ventricle  demonstrates global hypokinesis. There is moderate left ventricular  hypertrophy. Left ventricular diastolic  parameters are consistent with Grade I diastolic dysfunction (impaired  relaxation).  2. Right ventricular systolic function is moderately reduced. The right  ventricular size is normal. There is moderately elevated pulmonary artery  systolic pressure. The estimated right ventricular systolic pressure is  50.5 mmHg.  3. Left atrial size was mildly dilated.  4. Right atrial size was mildly dilated.  5. The mitral valve is grossly normal. Trivial mitral valve  regurgitation.  6. The aortic valve is tricuspid. Aortic valve regurgitation is not  visualized. Mild aortic valve sclerosis is present, with no evidence of  aortic valve stenosis.  7. The inferior vena cava is dilated in size with >50% respiratory  variability, suggesting right atrial pressure of 8 mmHg.   FINDINGS  Left Ventricle: Left ventricular ejection fraction, by estimation, is 35  to 40%. The left ventricle has moderately decreased function. The left  ventricle demonstrates global hypokinesis. The left ventricular internal  cavity size was normal in size.  There is moderate left ventricular hypertrophy. Left ventricular diastolic  parameters are consistent with Grade I diastolic dysfunction (impaired  relaxation).   Right Ventricle: The right ventricular size is normal. No increase in  right ventricular wall thickness. Right ventricular systolic function is  moderately reduced. There is moderately elevated pulmonary artery systolic  pressure. The  tricuspid regurgitant  velocity is 3.26 m/s, and with an assumed right atrial pressure of 8 mmHg,  the estimated right ventricular systolic pressure is 50.5 mmHg.   Left Atrium: Left atrial size was mildly dilated.   Right Atrium: Right atrial  size was mildly dilated.   Pericardium: There is no evidence of pericardial effusion.   Mitral Valve: The mitral valve is grossly normal. Trivial mitral valve  regurgitation.   Tricuspid Valve: The tricuspid valve is grossly normal. Tricuspid valve  regurgitation is mild.   Aortic Valve: The aortic valve is tricuspid. Aortic valve regurgitation is  not visualized. Mild aortic valve sclerosis is present, with no evidence  of aortic valve stenosis. Moderate aortic valve annular calcification.   Pulmonic Valve: The pulmonic valve was grossly normal. Pulmonic valve  regurgitation is trivial.   Aorta: The aortic root is normal in size and structure.   Venous: The inferior vena cava is dilated in size with greater than 50%  respiratory variability, suggesting right atrial pressure of 8 mmHg.   IAS/Shunts: No atrial level shunt detected by color flow Doppler.     LEFT VENTRICLE  PLAX 2D  LVIDd:     4.70 cm   Diastology  LVIDs:     3.80 cm   LV e' lateral:  8.59 cm/s  LV PW:     1.30 cm   LV E/e' lateral: 8.0  LV IVS:    1.40 cm   LV e' medial:  6.30 cm/s  LVOT diam:   2.00 cm   LV E/e' medial: 10.9  LV SV:     56  LV SV Index:  26  LVOT Area:   3.14 cm                 3D Volume EF:  LV Volumes (MOD)      3D EF:    39 %  LV vol d, MOD A2C: 129.0 ml LV EDV:    220 ml  LV vol d, MOD A4C: 164.0 ml LV ESV:    134 ml  LV vol s, MOD A2C: 68.5 ml LV SV:    86 ml  _____________  Venous dopplers 12/07/19 Summary:  BILATERAL:  - No evidence of deep vein thrombosis seen in the lower extremities,  bilaterally.  -  History of Present Illness     Maria Duncan is a 54 y.o. female with hx of HTN, obesity and presented to ER after 2 weeks of increasing SOB and leg swelling.  She does have chronic SOB due to obesity but now she is unable to do very little without increase of SOB.  Her wt was up 30 lbs.  She  admits to poor diet with high salt, snack food.  She had stopped BP meds due to cough.  In ER BP elevated and she had nonspecific ST/T wave changes on EKG.  CXR with mild interstitial edema and cardiac CTA with cardiomegaly, trace pericardial effusion no acute PE.   No prior cardiac issues.   BNP 862, Troponin 54 and 52 K+ 4.7 Cr 1.02 LDL 123, HDL 65  Iron 58,  TSH 2.987 A1C 7.1  She was admitted with new CHF -systolic per bedside echo, for IV diuresis cardiac cath and further eval.   Hospital Course     Consultants: none   The next AM she was neg 2755 and did feel better, spironolactone added to meds.  Echo with EF 35-40%.  She underwent cardiac cath and has minimal  non obstructive CAD (LAD 20%).  Severe NICM with EF 25%, due to HTN and undiagnosed OSA. Normal CO.  She was continued on IV lasix and entresto was started.  She continued to diuresis.    Today she is feeling well.  Dr. Elease Hashimoto feels she needs outpt oxygen.  She has no PCP so arrangements were made with community health.   She has been found to be diabetic with A1C of 7.1.  We will ask community health to treat her diabetes.  Consider farxiga.    Check p02 with exertion she drops sp02 to 88, she needs home 02  Have arranged.  She needs sleep study will arrange on OV.   Prior to discharge her BP was in 80s , discussed with Dr. Elease Hashimoto and we decreased entresto to 24/26.  Meds went to [facility] pharmacy     She is neg 9165 cc today, at discharge.  Wt is down from 114.3 Kg to 109.6 Kg.   Did the patient have an acute coronary syndrome (MI, NSTEMI, STEMI, etc) this admission?:  No                               Did the patient have a percutaneous coronary intervention (stent / angioplasty)?:  No.   _____________  Discharge Vitals Blood pressure (!) 87/60, pulse 79, temperature 98.7 F (37.1 C), temperature source Oral, resp. rate 17, height  (1.626 m), weight 109.6 kg, SpO2 94 %.  Filed Weights   12/10/19 0454 12/11/19 0446  12/12/19 0445  Weight: 108.4 kg 108.7 kg 109.6 kg    Labs & Radiologic Studies    CBC No results for input(s): WBC, NEUTROABS, HGB, HCT, MCV, PLT in the last 72 hours. Basic Metabolic Panel Recent Labs    07/26/70 1023 12/12/19 0527  NA 137 138  K 4.2 4.8  CL 87* 93*  CO2 35* 36*  GLUCOSE 162* 155*  BUN 23* 36*  CREATININE 1.83* 1.50*  CALCIUM 9.4 9.0   Liver Function Tests No results for input(s): AST, ALT, ALKPHOS, BILITOT, PROT, ALBUMIN in the last 72 hours. No results for input(s): LIPASE, AMYLASE in the last 72 hours. High Sensitivity Troponin:   Recent Labs  Lab 12/07/19 1827 12/07/19 2012  TROPONINIHS 54* 52*    BNP Invalid input(s): POCBNP D-Dimer No results for input(s): DDIMER in the last 72 hours. Hemoglobin A1C No results for input(s): HGBA1C in the last 72 hours. Fasting Lipid Panel No results for input(s): CHOL, HDL, LDLCALC, TRIG, CHOLHDL, LDLDIRECT in the last 72 hours. Thyroid Function Tests No results for input(s): TSH, T4TOTAL, T3FREE, THYROIDAB in the last 72 hours.  Invalid input(s): FREET3 _____________  DG Chest 2 View  Result Date: 12/07/2019 CLINICAL DATA:  Shortness of breath, swelling, fatigue and cough EXAM: CHEST - 2 VIEW COMPARISON:  CT 06/09/2016 FINDINGS: Low lung volumes with some atelectatic changes in the bases. More hazy interstitial and perihilar opacity with central cuffing and cephalized, indistinct vascularity could reflect early features of interstitial edema. Cardiac silhouette is enlarged when compared to prior exam. The aorta is calcified. The remaining cardiomediastinal contours are unremarkable. No acute osseous or soft tissue abnormality. Degenerative changes are present in the imaged spine and shoulders. Features of calcific tendinosis left shoulder. IMPRESSION: Findings suggestive of mild CHF in the given clinical setting. Electronically Signed   By: Kreg Shropshire M.D.   On: 12/07/2019 16:17   CT Angio Chest PE  W and/or  Wo Contrast  Result Date: 12/07/2019 CLINICAL DATA:  Shortness of breath, elevated D-dimer, negative Doppler. EXAM: CT ANGIOGRAPHY CHEST WITH CONTRAST TECHNIQUE: Multidetector CT imaging of the chest was performed using the standard protocol during bolus administration of intravenous contrast. Multiplanar CT image reconstructions and MIPs were obtained to evaluate the vascular anatomy. CONTRAST:  OMNIPAQUE IOHEXOL 350 MG/ML SOLN COMPARISON:  CT 06/10/2016 FINDINGS: Cardiovascular: Satisfactory opacification the pulmonary arteries to the segmental level. No pulmonary artery filling defects are identified. Central pulmonary arteries are normal caliber. Cardiomegaly is similar to comparison. There is trace pericardial effusion. Poor opacification of the thoracic aorta limits evaluation of the lumen. The aorta is normal caliber. Shared origin of the brachiocephalic and left common carotid arteries. Mediastinum/Nodes: Fatty stippling of the anterior mediastinum may reflect thymic remnant, unchanged since 2017. No mediastinal fluid or gas. Normal thyroid gland and thoracic inlet. No acute abnormality of the trachea or esophagus. No worrisome mediastinal, hilar or axillary adenopathy. Lungs/Pleura: Hypoventilatory changes are likely accentuated by imaging during exhalation for this angiographic technique. No consolidation, features of edema, pneumothorax, or effusion. No suspicious pulmonary nodules or masses. Upper Abdomen: No acute abnormalities present in the visualized portions of the upper abdomen. Musculoskeletal: Multilevel degenerative changes are present in the imaged portions of the spine. No acute osseous abnormality or suspicious osseous lesion. Review of the MIP images confirms the above findings. IMPRESSION: 1. No acute pulmonary artery filling defects to suggest pulmonary embolism. 2. Stable cardiomegaly.  Trace pericardial effusion. 3. Hypoventilatory changes, likely accentuated by imaging during  exhalation for this angiographic technique. Electronically Signed   By: Kreg Shropshire M.D.   On: 12/07/2019 20:22   CARDIAC CATHETERIZATION  Result Date: 12/09/2019  Prox LAD lesion is 20% stenosed.  Findings: Ao = 131/86 (109) LV = 138/29 RA =   9 RV = 58/15 PA = 63/23 (38) PCW = 21 Fick cardiac output/index = 5.2/2.4 PVR = 3.2 WU FA sat = 93% PA sat = 62%, 67% ABG: 7.27/88/81/93% Assessment: 1. 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 Severe NICM EF 25% suspect due to HTN and undiagnosed OSA 3. Elevated filling pressures with normal cardiac output 4. OHS/OSA with CO2 retention (patient received 2mg  versed and fentanyl during cath) Plan/Discussion: Increase lasix to 80 IV bid and add metolazone (may need short course of Diamox). 80 IV lasix given in cath lab.  Start Entresto (had nausea with losartan so should be ok). Will need aggressive BP control and initiation of CPAP in house. Patient awake and talking despite PCO2 ~90. ABG nearly compensated so this is chronic. Will take to holding area and BIPAP briefly until pCO2 < 80. HF team happy to assist with care as needed. Arvilla Meres, MD 8:33 AM   ECHOCARDIOGRAM COMPLETE  Result Date: 12/08/2019    ECHOCARDIOGRAM REPORT   Patient Name:   Maria Duncan Date of Exam: 12/08/2019 Medical Rec #:  [id number]    Height:       64.0 in Accession #:    [id number]   Weight:       252.0 lb Date of Birth:  1966-07-25    BSA:          2.158 m Patient Age:    53 years     BP:           141/96 mmHg Patient Gender: F            HR:  90 bpm. Exam Location:  Inpatient Procedure: 2D Echo, 3D Echo, Cardiac Doppler and Color Doppler Indications:    Elevated troponin  History:        Patient has no prior history of Echocardiogram examinations.                 Signs/Symptoms:Chest Pain. Cough.  Sonographer:    Roosvelt Maser RDCS Referring Phys: [id number] ANASTASSIA DOUTOVA IMPRESSIONS  1. Left ventricular ejection fraction, by estimation, is 35 to 40%. The left ventricle  has moderately decreased function. The left ventricle demonstrates global hypokinesis. There is moderate left ventricular hypertrophy. Left ventricular diastolic parameters are consistent with Grade I diastolic dysfunction (impaired relaxation).  2. Right ventricular systolic function is moderately reduced. The right ventricular size is normal. There is moderately elevated pulmonary artery systolic pressure. The estimated right ventricular systolic pressure is 50.5 mmHg.  3. Left atrial size was mildly dilated.  4. Right atrial size was mildly dilated.  5. The mitral valve is grossly normal. Trivial mitral valve regurgitation.  6. The aortic valve is tricuspid. Aortic valve regurgitation is not visualized. Mild aortic valve sclerosis is present, with no evidence of aortic valve stenosis.  7. The inferior vena cava is dilated in size with >50% respiratory variability, suggesting right atrial pressure of 8 mmHg. FINDINGS  Left Ventricle: Left ventricular ejection fraction, by estimation, is 35 to 40%. The left ventricle has moderately decreased function. The left ventricle demonstrates global hypokinesis. The left ventricular internal cavity size was normal in size. There is moderate left ventricular hypertrophy. Left ventricular diastolic parameters are consistent with Grade I diastolic dysfunction (impaired relaxation). Right Ventricle: The right ventricular size is normal. No increase in right ventricular wall thickness. Right ventricular systolic function is moderately reduced. There is moderately elevated pulmonary artery systolic pressure. The tricuspid regurgitant velocity is 3.26 m/s, and with an assumed right atrial pressure of 8 mmHg, the estimated right ventricular systolic pressure is 50.5 mmHg. Left Atrium: Left atrial size was mildly dilated. Right Atrium: Right atrial size was mildly dilated. Pericardium: There is no evidence of pericardial effusion. Mitral Valve: The mitral valve is grossly normal.  Trivial mitral valve regurgitation. Tricuspid Valve: The tricuspid valve is grossly normal. Tricuspid valve regurgitation is mild. Aortic Valve: The aortic valve is tricuspid. Aortic valve regurgitation is not visualized. Mild aortic valve sclerosis is present, with no evidence of aortic valve stenosis. Moderate aortic valve annular calcification. Pulmonic Valve: The pulmonic valve was grossly normal. Pulmonic valve regurgitation is trivial. Aorta: The aortic root is normal in size and structure. Venous: The inferior vena cava is dilated in size with greater than 50% respiratory variability, suggesting right atrial pressure of 8 mmHg. IAS/Shunts: No atrial level shunt detected by color flow Doppler.  LEFT VENTRICLE PLAX 2D LVIDd:         4.70 cm      Diastology LVIDs:         3.80 cm      LV e' lateral:   8.59 cm/s LV PW:         1.30 cm      LV E/e' lateral: 8.0 LV IVS:        1.40 cm      LV e' medial:    6.30 cm/s LVOT diam:     2.00 cm      LV E/e' medial:  10.9 LV SV:         56 LV SV Index:   26 LVOT Area:  3.14 cm                              3D Volume EF: LV Volumes (MOD)            3D EF:        39 % LV vol d, MOD A2C: 129.0 ml LV EDV:       220 ml LV vol d, MOD A4C: 164.0 ml LV ESV:       134 ml LV vol s, MOD A2C: 68.5 ml  LV SV:        86 ml LV vol s, MOD A4C: 101.0 ml LV SV MOD A2C:     60.5 ml LV SV MOD A4C:     164.0 ml LV SV MOD BP:      61.1 ml RIGHT VENTRICLE            IVC RV Basal diam:  5.60 cm    IVC diam: 2.20 cm RV Mid diam:    5.20 cm RV S prime:     7.99 cm/s TAPSE (M-mode): 1.8 cm LEFT ATRIUM           Index       RIGHT ATRIUM           Index LA diam:      5.30 cm 2.46 cm/m  RA Area:     26.30 cm LA Vol (A4C): 83.2 ml 38.55 ml/m RA Volume:   100.00 ml 46.33 ml/m  AORTIC VALVE LVOT Vmax:   124.00 cm/s LVOT Vmean:  79.800 cm/s LVOT VTI:    0.179 m  AORTA Ao Root diam: 3.20 cm Ao Asc diam:  3.20 cm MITRAL VALVE               TRICUSPID VALVE MV Area (PHT): 3.99 cm    TR Peak grad:    42.5 mmHg MV Decel Time: 190 msec    TR Vmax:        326.00 cm/s MV E velocity: 68.60 cm/s MV A velocity: 91.70 cm/s  SHUNTS MV E/A ratio:  0.75        Systemic VTI:  0.18 m                            Systemic Diam: 2.00 cm Nona Dell MD Electronically signed by Nona Dell MD Signature Date/Time: 12/08/2019/12:52:55 PM    Final    VAS [place] LOWER EXTREMITY VENOUS (DVT) ([facility] and [facility] 7a-7p)  Result Date: 12/08/2019  Lower Venous DVTStudy Indications: Swelling, and SOB.  Comparison Study: No prior study on file for comparison Performing Technologist: Sherren Kerns RVS  Examination Guidelines: A complete evaluation includes B-mode imaging, spectral Doppler, color Doppler, and power Doppler as needed of all accessible portions of each vessel. Bilateral testing is considered an integral part of a complete examination. Limited examinations for reoccurring indications may be performed as noted. The reflux portion of the exam is performed with the patient in reverse Trendelenburg.  +---------+---------------+---------+-----------+----------+--------------+ RIGHT    CompressibilityPhasicitySpontaneityPropertiesThrombus Aging +---------+---------------+---------+-----------+----------+--------------+ CFV      Full           Yes      Yes                                 +---------+---------------+---------+-----------+----------+--------------+ SFJ  Full                                                        +---------+---------------+---------+-----------+----------+--------------+ FV Prox  Full                                                        +---------+---------------+---------+-----------+----------+--------------+ FV Mid   Full                                                        +---------+---------------+---------+-----------+----------+--------------+ FV DistalFull                                                         +---------+---------------+---------+-----------+----------+--------------+ PFV      Full                                                        +---------+---------------+---------+-----------+----------+--------------+ POP      Full           Yes      Yes                                 +---------+---------------+---------+-----------+----------+--------------+ PTV      Full                                                        +---------+---------------+---------+-----------+----------+--------------+ PERO     Full                                                        +---------+---------------+---------+-----------+----------+--------------+   +---------+---------------+---------+-----------+----------+--------------+ LEFT     CompressibilityPhasicitySpontaneityPropertiesThrombus Aging +---------+---------------+---------+-----------+----------+--------------+ CFV      Full           Yes      Yes                                 +---------+---------------+---------+-----------+----------+--------------+ SFJ      Full                                                        +---------+---------------+---------+-----------+----------+--------------+  FV Prox  Full                                                        +---------+---------------+---------+-----------+----------+--------------+ FV Mid   Full                                                        +---------+---------------+---------+-----------+----------+--------------+ FV DistalFull                                                        +---------+---------------+---------+-----------+----------+--------------+ PFV      Full                                                        +---------+---------------+---------+-----------+----------+--------------+ POP      Full           Yes      Yes                                  +---------+---------------+---------+-----------+----------+--------------+ PTV      Full                                                        +---------+---------------+---------+-----------+----------+--------------+ PERO     Full                                                        +---------+---------------+---------+-----------+----------+--------------+     Summary: BILATERAL: - No evidence of deep vein thrombosis seen in the lower extremities, bilaterally. -   *See table(s) above for measurements and observations. Electronically signed by Fabienne Bruns MD on 12/08/2019 at 8:50:08 AM.    Final    Disposition   Pt is being discharged home today in good condition.  Follow-up Plans & Appointments    Follow-up Information    [facility].   Why: GO:  Contact information: [address] [facility] [address] [phone]       Jobe Gibbon, [facility] Follow up.   Specialty: Home Health Services Why: [facility] Contact information: [address] [place] [address] [phone]        Madellyn Denio, Deloris Ping, MD Follow up on 12/19/2019.   Specialty: Cardiology Why: at 3:15 pm with his nurse practitioner Nada Boozer Contact information: [address] [place] [address] [phone]  Bensimhon, Shaune Pascal, MD Follow up on 12/26/2019.   Specialty: Cardiology Why: at 830 AM with his nurse practitioner Amy Clegg call their office the day before to get parking insturctions.  Contact information: [place] [address] [phone]           Call [facility] at [phone] if any bleeding, swelling or drainage at cath site.  May shower, no tub baths for 48 hours for groin sticks. No lifting over 5 pounds for 3 days.  No Driving for 3 days  Heart Healthy Diabetic low salt diet.  No fast foods, they are high in salt.  No  more than 2000 mg per day.   Weigh daily and if weight climbs more than 3 pounds in a day or 5 pounds in a week call the office.  Weight loss is important.  Look at [facility] Diet this will give some direction    Discharge Medications   Allergies as of 12/12/2019      Reactions   Hyzaar [losartan Potassium-hctz] Nausea And Vomiting, Cough      Medication List    STOP taking these medications   amoxicillin-clavulanate 875-125 MG tablet Commonly known as: AUGMENTIN   ELDERBERRY PO   naproxen 500 MG tablet Commonly known as: NAPROSYN   Sweet Oil Oil   traMADol 50 MG tablet Commonly known as: ULTRAM   zinc gluconate 50 MG tablet     TAKE these medications   acetaminophen 325 MG tablet Commonly known as: TYLENOL Take 2 tablets (650 mg total) by mouth every 4 (four) hours as needed for headache or mild pain.   aspirin 81 MG EC tablet Take 1 tablet (81 mg total) by mouth daily. Start taking on: Dec 13, 2019   cholecalciferol 25 MCG (1000 UNIT) tablet Commonly known as: VITAMIN D3 Take 1,000 Units by mouth daily.   docusate sodium 100 MG capsule Commonly known as: COLACE Take 1 capsule (100 mg total) by mouth 2 (two) times daily.   guaiFENesin 100 MG/5ML liquid Commonly known as: ROBITUSSIN Take 200 mg by mouth daily as needed for cough.   metoprolol tartrate 25 MG tablet Commonly known as: LOPRESSOR Take 0.5 tablets (12.5 mg total) by mouth 2 (two) times daily.   pantoprazole 40 MG tablet Commonly known as: PROTONIX Take 1 tablet (40 mg total) by mouth daily. Start taking on: Dec 13, 2019   polyethylene glycol 17 g packet Commonly known as: MIRALAX / GLYCOLAX Take 17 g by mouth daily as needed (constipation).   potassium chloride SA 20 MEQ tablet Commonly known as: KLOR-CON Take 1 tablet (20 mEq total) by mouth daily. Start taking on: Dec 13, 2019   sacubitril-valsartan 24-26 MG Commonly known as: ENTRESTO Take 1 tablet by mouth 2 (two) times daily.     spironolactone 25 MG tablet Commonly known as: ALDACTONE Take 1 tablet (25 mg total) by mouth daily. Start taking on: Dec 13, 2019   torsemide 20 MG tablet Commonly known as: DEMADEX Take 1 tablet (20 mg total) by mouth daily. Start taking on: Dec 13, 2019            Durable Medical Equipment  (From admission, onward)         Start     Ordered   12/12/19 1624  For home use only DME oxygen  Once    Question Answer Comment  Length of Need Lifetime   Mode or (Route) Nasal cannula  Liters per Minute 2   Frequency Continuous (stationary and portable oxygen unit needed)   Oxygen conserving device Yes   Oxygen delivery system Gas      12/12/19 1624             Outstanding Labs/Studies   BMP Arrange sleep study   Duration of Discharge Encounter   Greater than 30 minutes including physician time.  Signed, Nada Boozer, NP 12/12/2019, 4:31 PM  Attending Note:   The patient was seen and examined.  Agree with assessment and plan as noted above.  Changes made to the above note as needed.  Patient seen and independently examined with Nada Boozer, NP .   We discussed all aspects of the encounter. I agree with the assessment and plan as stated above.  1.   CHF:    Did well with ambulation.  Cont current meds.    2.  Pulmonary hypertension:   Due to obesity hypoventilation and possible OSA  Rec. Weight loss . Needs home O2.  Will obtain O2 sats needed for home O2 order before DC   3.  Hypertension:    BP is controlled, even a bit on the low side .  Will adjust as needed.   4.  Chest pain:    5.  Morbid obesity:   She needs to get a primary md.  Wants to learn more about nutrition.      6.  Constipation:   Getting miralax.   Might try mag citrate also    Ok for DC today . Will need a TOC visit in 7-10 days with BMP    I have spent a total of 40 minutes with patient reviewing hospital  notes , telemetry, EKGs, labs and examining patient as well as  establishing an assessment and plan that was discussed with the patient. > 50% of time was spent in direct patient care.    Vesta Mixer, Montez Hageman., MD, [facility] 12/12/2019, 5:29 PM [address],  [address] Office [phone] Pager [phone]

## 2019-12-12 NOTE — Progress Notes (Signed)
SATURATION QUALIFICATIONS: (This note is used to comply with regulatory documentation for home oxygen)  Patient Saturations on Room Air at Rest = 94%  Patient Saturations on Room Air while Ambulating = 88%  Patient Saturations on 2 Liters of oxygen while Ambulating = 95%  Please briefly explain why patient needs home oxygen:  Patient O2 saturations drop while patient ambulates and sleeps

## 2019-12-12 NOTE — TOC Transition Note (Signed)
Transition of Care [facility]) - CM/SW Discharge Note   Patient Details  Name: [name] MRN: [id number] Date of Birth: September 12, 1965  [facility]) CM/SW Contact:  Leone Haven, RN Phone Number: 12/12/2019, 4:34 PM   Clinical Narrative:    Patient is for dc home today, she will have [facility] for charity with [facility], and home oxygen for charity with [employer].  Referral given to [facility] with [employer], he will bring oxygen up to room prior to dc.     Final next level of care: Home w Home Health Services Barriers to Discharge: No Barriers Identified   Patient Goals and CMS Choice        Discharge Placement                       Discharge Plan and Services                DME Arranged: Oxygen DME Agency: [employer] Date DME Agency Contacted: 12/12/19 Time DME Agency Contacted: [phone] Representative spoke with at DME Agency: Ian Malkin HH Arranged: RN HH Agency: [facility] Date [facility] Agency Contacted: 12/11/19 Time HH Agency Contacted: 1300 Representative spoke with at [facility] Agency: [place]  Social Determinants of Health (SDOH) Interventions     Readmission Risk Interventions No flowsheet data found.

## 2019-12-12 NOTE — Progress Notes (Signed)
Progress Note  Patient Name: Maria Duncan Date of Encounter: 12/12/2019  Primary Cardiologist: Nahser , Allred.   Subjective    54 yo female with hx of morbid obesity,  Obesity hypoventilation, OSA, HTN  admitted with acute on chronic CHF  Cath earlier today revealed mild coronary artery irregularities. Ejection fractions around 25%.  Pulmonary artery pressures were 63/28 with a mean of 38.  Pulmonary capillary wedge pressure is 21. Cardiac output is 5.2 with an index of 2.4.  PVR is 3.2 Wood units. Arterial blood gas in the Cath Lab revealed a pH of 7.27, PCO2 of 88 with an O2 of 81.  Has diuresed 9.1 liters so far this admission Has had a cough - wondering if its associated with entresto   Will get O2 sat information for home O 2 needs.     Inpatient Medications    Scheduled Meds: . aspirin EC  81 mg Oral Daily  . docusate sodium  100 mg Oral BID  . enoxaparin (LOVENOX) injection  55 mg Subcutaneous Q24H  . metoprolol tartrate  12.5 mg Oral BID  . pantoprazole  40 mg Oral Daily  . potassium chloride  20 mEq Oral BID  . sacubitril-valsartan  1 tablet Oral BID  . sodium chloride flush  3 mL Intravenous Q12H  . spironolactone  25 mg Oral Daily  . torsemide  20 mg Oral Daily   Continuous Infusions:  PRN Meds: acetaminophen, HYDROcodone-acetaminophen, ondansetron **OR** ondansetron (ZOFRAN) IV, ondansetron (ZOFRAN) IV, polyethylene glycol   Vital Signs    Vitals:   12/11/19 1156 12/11/19 2001 12/12/19 0445 12/12/19 0501  BP: (!) 97/47 103/81  (!) 93/57  Pulse: 85 87  82  Resp: 18 16  17   Temp:  98.3 F (36.8 C)  98.3 F (36.8 C)  TempSrc:  Oral  Oral  SpO2: 94% 90%  91%  Weight:   109.6 kg   Height:        Intake/Output Summary (Last 24 hours) at 12/12/2019 0845 Last data filed at 12/12/2019 0836 Gross per 24 hour  Intake 1180 ml  Output 875 ml  Net 305 ml   Last 3 Weights 12/12/2019 12/11/2019 12/10/2019  Weight (lbs) 241 lb 9.6 oz 239 lb 11.2 oz 239 lb   Weight (kg) 109.589 kg 108.727 kg 108.41 kg      Telemetry      NSR - Personally Reviewed  ECG     - Personally Reviewed  Physical Exam   Physical Exam: Blood pressure (!) 93/57, pulse 82, temperature 98.3 F (36.8 C), temperature source Oral, resp. rate 17, height 5\' 4"  (1.626 m), weight 109.6 kg, SpO2 91 %.  GEN:   Morbidly obese female  HEENT: Normal NECK: No JVD; No carotid bruits LYMPHATICS: No lymphadenopathy CARDIAC: RRR , no murmurs, rubs, gallops RESPIRATORY:  Clear to auscultation without rales, wheezing or rhonchi  ABDOMEN: Soft, non-tender, non-distended MUSCULOSKELETAL:  No edema; No deformity  SKIN: Warm and dry NEUROLOGIC:  Alert and oriented x 3   Labs    High Sensitivity Troponin:   Recent Labs  Lab 12/07/19 1827 12/07/19 2012  TROPONINIHS 54* 52*      Chemistry Recent Labs  Lab 12/07/19 1827 12/07/19 1827 12/08/19 0435 12/09/19 0806 12/09/19 1026 12/10/19 1023 12/12/19 0527  NA 140   < > 142   < > 141 137 138  K 4.7   < > 3.8   < > 3.5 4.2 4.8  CL 100   < >  98  --   --  87* 93*  CO2 31   < > 33*  --   --  35* 36*  GLUCOSE 120*   < > 154*  --   --  162* 155*  BUN 11   < > 10  --   --  23* 36*  CREATININE 1.02*   < > 0.97  --   --  1.83* 1.50*  CALCIUM 9.4   < > 9.3  --   --  9.4 9.0  PROT 6.9  --  6.6  --   --   --   --   ALBUMIN 3.6  --  3.4*  --   --   --   --   AST 37  --  30  --   --   --   --   ALT 88*  --  74*  --   --   --   --   ALKPHOS 89  --  82  --   --   --   --   BILITOT 0.7  --  1.0  --   --   --   --   GFRNONAA >60   < > >60  --   --  31* 39*  GFRAA >60   < > >60  --   --  36* 46*  ANIONGAP 9   < > 11  --   --  15 9   < > = values in this interval not displayed.     Hematology Recent Labs  Lab 12/07/19 1827 12/07/19 1827 12/08/19 0435 12/09/19 0806 12/09/19 0815 12/09/19 0827 12/09/19 1026  WBC 6.7  --  5.9  --   --   --   --   RBC 5.02  --  4.82  --   --   --   --   HGB 14.0   < > 13.5   < > 13.9 14.6  15.3*  HCT 46.2*   < > 43.9   < > 41.0 43.0 45.0  MCV 92.0  --  91.1  --   --   --   --   MCH 27.9  --  28.0  --   --   --   --   MCHC 30.3  --  30.8  --   --   --   --   RDW 13.7  --  13.5  --   --   --   --   PLT 152  --  225  --   --   --   --    < > = values in this interval not displayed.    BNP Recent Labs  Lab 12/07/19 1827  BNP 862.0*     DDimer  Recent Labs  Lab 12/07/19 1827  DDIMER 0.99*     Radiology    No results found.  Cardiac Studies      Patient Profile     54 y.o. female with obesity, HTN, OSA ( not formally diagnosed yet) and acute on chronic CHF  Assessment & Plan    1.   CHF:    Did well with ambulation.  Cont current meds.    2.  Pulmonary hypertension:   Due to obesity hypoventilation and possible OSA  Rec. Weight loss . Needs home O2.  Will obtain O2 sats needed for home O2 order before DC   3.  Hypertension:    BP is controlled, even  a bit on the low side .  Will adjust as needed.   4.  Chest pain:    5.  Morbid obesity:   She needs to get a primary md.  Wants to learn more about nutrition.      6.  Constipation:   Getting miralax.   Might try mag citrate also    Ok for DC today . Will need a TOC visit in 7-10 days with BMP   For questions or updates, please contact [facility] Please consult [email or web address] for contact info under        Signed, Kristeen Miss, MD  12/12/2019, 8:45 AM

## 2019-12-12 NOTE — Progress Notes (Addendum)
Heart Failure Stewardship Pharmacist Progress Note   PCP: Patient, No Pcp Per PCP-Cardiologist: No primary care provider on file.    HPI:  54 yo female with PMH significant for HTN and obesity admitted on 12/07/19 for shortness of breath and leg swelling over the last 2 weeks. She believes she has gained about 30 or more pounds in the past year, but isn't sure whether she has gained more in the past few weeks. She eats a poor diet with lots of snack/junk food. She was previously on medication for HTN, but she stopped this as she said she developed a cough.  ECHO completed on 12/08/19 and found to have LVEF 35-40%.   R/LHC on 12/09/19 with minimally obstructive CAD and elevated filling pressures with normal cardiac output. RA 9, PA 38, PCWP 21, CO/CI 5.2/2.4  She had been receiving IV diuresis + metolazone + diamox for volume overload. This was transitioned to PO torsemide on 5/19. She is down a total of 11 lbs since admission but up 2 lbs from yesterday.   Current HF Medications: Torsemide 20 mg daily KCl 20 mEq PO BID Entresto 49/51 mg BID Metoprolol 12.5 mg BID Spironolactone 25 mg daily  Prior to admission HF Medications: None  Pertinent Lab Values: . Serum creatinine 1.5, BUN 36, Potassium 4.8, Sodium 138, BNP 862   Vital Signs: . Weight: 241 lbs (estimated dry weight: 235 lbs) . Blood pressure: 90-100/60s  . Heart rate: 80-90s   Medication Assistance / Insurance Benefits Check: Does the patient have prescription insurance?   No Type of insurance plan: N/A  Does the patient qualify for medication assistance through manufacturers or grants? Yes  Eligible grants and/or patient assistance programs:   Will use HF fund with HF clinic  Entresto patient assistance through [employer]  Medication assistance applications in progress:   Entresto through [employer]  Medication assistance applications approved: None  Approved medication assistance renewals will be completed by: HF  Clinic  Outpatient Pharmacy:  Prior to admission outpatient pharmacy: [facility] Is the patient willing to use [place] at discharge?   Yes - pharmacy has been updated Is the patient willing to transition their outpatient pharmacy to utilize a [facility] outpatient pharmacy? Yes - will fill with [facility] outpatient pharmacy with HF fund    Assessment: 1. Acute systolic CHF (EF 53%), due to HTN and undiagnosed OSA. NYHA class II/III symptoms. Volume status improved with IV diuresis. Now transitioned to PO diuretics. Weight down 11 lbs since admission, but up 2 lbs from yesterday. No edema on MD exam. - Continue torsemide 20 mg daily  - Consider stopping KCl with K of 4.8 today - Continue metoprolol 12.5 mg BID - consider transitioning to metoprolol succinate prior to discharge - Continue Entresto 49/51 mg BID - Continue spironolactone 25 mg daily - Consider adding Farxiga 10 mg daily at hospital follow up visit   Plan: 1) Medication changes recommended at this time: - Stop or reduce scheduled K replacement with K of 4.8 today  2) Patient assistance application(s): - Entresto patient assistance through [employer]. - Patient will use HF fund through HF clinic for medications  3)  Education  - Patient has been educated on current HF medications (Entresto, metoprolol, spironolactone, torsemide, KCl) and potential additions to HF medication regimen [facility]ade) - Patient verbalizes understanding that over the next few months, these medication doses may change and more medications may be added to optimize HF regimen - Patient has been educated on basic  disease state pathophysiology and goals of therapy - Time spent (30 min)    Danae Orleans, PharmD, BCPS Heart Failure Stewardship Pharmacist Phone [phone] 12/12/2019       9:04 AM

## 2019-12-13 ENCOUNTER — Telehealth (HOSPITAL_COMMUNITY): Payer: Self-pay | Admitting: Pharmacist

## 2019-12-13 NOTE — Telephone Encounter (Signed)
Sent in Manufacturer's Assistance application to [employer] for Entresto.    Application pending, will continue to follow.  Honor Frison, PharmD, BCPS, BCCP, CPP Heart Failure Clinic Pharmacist [phone]  

## 2019-12-16 NOTE — Telephone Encounter (Signed)
Advanced Heart Failure Patient Advocate Encounter   Patient was approved to receive Entresto from [employer].  Patient ID: [id number] Effective dates: 12/16/19 through 12/15/20  Karle Plumber, PharmD, BCPS, BCCP, CPP Heart Failure Clinic Pharmacist [phone]

## 2019-12-18 ENCOUNTER — Telehealth: Payer: Self-pay | Admitting: Cardiovascular Disease

## 2019-12-18 NOTE — Telephone Encounter (Signed)
Patient is requesting to have her caretaker, Molly Maduro, accompany her during her appointment scheduled for tomorrow, 12/19/19 at 3:15 PM with Nada Boozer. She states she needs assistance due to being on oxygen. Please advise.

## 2019-12-18 NOTE — Telephone Encounter (Signed)
Returned call to pt.  Left a detailed message for pt that it is ok for caregiver to accompany her to her appt tomorrow, 5/27. Will add note to apt notes.

## 2019-12-19 ENCOUNTER — Encounter: Payer: Self-pay | Admitting: Cardiology

## 2019-12-19 ENCOUNTER — Other Ambulatory Visit: Payer: Self-pay

## 2019-12-19 ENCOUNTER — Ambulatory Visit (INDEPENDENT_AMBULATORY_CARE_PROVIDER_SITE_OTHER): Payer: Self-pay | Admitting: Cardiology

## 2019-12-19 VITALS — BP 116/70 | HR 82 | Ht 64.0 in | Wt 242.4 lb

## 2019-12-19 DIAGNOSIS — I428 Other cardiomyopathies: Secondary | ICD-10-CM

## 2019-12-19 DIAGNOSIS — G4733 Obstructive sleep apnea (adult) (pediatric): Secondary | ICD-10-CM

## 2019-12-19 DIAGNOSIS — I517 Cardiomegaly: Secondary | ICD-10-CM

## 2019-12-19 DIAGNOSIS — I10 Essential (primary) hypertension: Secondary | ICD-10-CM

## 2019-12-19 DIAGNOSIS — I5042 Chronic combined systolic (congestive) and diastolic (congestive) heart failure: Secondary | ICD-10-CM

## 2019-12-19 MED ORDER — TORSEMIDE 20 MG PO TABS: 20.0000 mg | ORAL_TABLET | Freq: Every day | ORAL | 1 refills | Status: DC

## 2019-12-19 MED ORDER — POTASSIUM CHLORIDE CRYS ER 20 MEQ PO TBCR: 20.0000 meq | EXTENDED_RELEASE_TABLET | Freq: Every day | ORAL | 1 refills | Status: DC

## 2019-12-19 MED ORDER — SPIRONOLACTONE 25 MG PO TABS: 25.0000 mg | ORAL_TABLET | Freq: Every day | ORAL | 1 refills | Status: DC

## 2019-12-19 MED ORDER — METOPROLOL TARTRATE 25 MG PO TABS: 12.5000 mg | ORAL_TABLET | Freq: Two times a day (BID) | ORAL | 1 refills | Status: DC

## 2019-12-19 NOTE — Patient Instructions (Addendum)
Medication Instructions:  Your physician recommends that you continue on your current medications as directed. Please refer to the Current Medication list given to you today.  *If you need a refill on your cardiac medications before your next appointment, please call your pharmacy*   Lab Work: TODAY:  BMET & PRO BNP  If you have labs (blood work) drawn today and your tests are completely normal, you will receive your results only by: Marland Kitchen MyChart Message (if you have MyChart) OR . A paper copy in the mail If you have any lab test that is abnormal or we need to change your treatment, we will call you to review the results.   Testing/Procedures: Your physician has recommended that you have a sleep study. This test records several body functions during sleep, including: brain activity, eye movement, oxygen and carbon dioxide blood levels, heart rate and rhythm, breathing rate and rhythm, the flow of air through your mouth and nose, snoring, body muscle movements, and chest and belly movement.    You have been referred to Nutritionist.  They will contact you with an appointment.     Follow-Up: At [facility], you and your health needs are our priority.  As part of our continuing mission to provide you with exceptional heart care, we have created designated Provider Care Teams.  These Care Teams include your primary Cardiologist (physician) and Advanced Practice Providers (APPs -  Physician Assistants and Nurse Practitioners) who all work together to provide you with the care you need, when you need it.  We recommend signing up for the patient portal called "MyChart".  Sign up information is provided on this After Visit Summary.  MyChart is used to connect with patients for Virtual Visits (Telemedicine).  Patients are able to view lab/test results, encounter notes, upcoming appointments, etc.  Non-urgent messages can be sent to your provider as well.   To learn more about what you can do with  MyChart, go to [email or web address].    Your next appointment:   1 month(s)   01/21/2020 10:40.   The format for your next appointment:   In Person  Provider:   You may see Kristeen Miss, MD or one of the following Advanced Practice Providers on your designated Care Team:    Tereso Newcomer, PA-C  Chelsea Aus, [place]    Other Instructions

## 2019-12-19 NOTE — Progress Notes (Signed)
Cardiology Office Note   Date:  12/20/2019   ID:  Maria Duncan, Maria Duncan 06-Aug-1965, MRN [id number]  PCP:  Patient, No Pcp Per  Cardiologist:  Dr. Angelina Ok     Chief Complaint  Patient presents with  . Hospitalization Follow-up      History of Present Illness: Maria Duncan is a 54 y.o. female who presents for post hospitalization acute systolic and diastolic HF, NICM EF 25% with cath, morbid obesity, pulmonary hypertension, OSA arranging sleep study but discharged on home oxygen with desat with ambulation. Hypoventilation associated with obesity, mild CAD on cath.   She was diuresed - also found to be diabetic and PCP through [employer] [facility], would like farxiga but currently finances are limited.  She was able to arrange meds at discharge.  She will see AHF for further recommendations and assistance with meds.  She has enough for 1 month.  We were able to add entresto.  Would like to increase but in hospital BP down to 80 systolic at higher dose. But plan to increase.  She was neg 9165 cc at discharge and wt 109.6 Kg.    She has been doing well, she - has had some memory issues numbers for alarm- at house and a few other moments of forgetting.  Sleeps on 2 pillows overall wt is stable.    Past Medical History:  Diagnosis Date  . 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 Chest pain of uncertain etiology, non obstructive CAD, pain due to acute HF 12/12/2019  . Hypertension   . Hypoventilation associated with obesity (HCC) 12/12/2019  . Morbid obesity (HCC)   . NICM (nonischemic cardiomyopathy) (HCC)   . OSA (obstructive sleep apnea) 12/12/2019  . Pulmonary hypertension, unspecified (HCC) 12/12/2019    Past Surgical History:  Procedure Laterality Date  . ABDOMINAL HYSTERECTOMY    . RIGHT/LEFT HEART CATH AND CORONARY ANGIOGRAPHY N/A 12/09/2019   Procedure: RIGHT/LEFT HEART CATH AND CORONARY ANGIOGRAPHY;  Surgeon: Dolores Patty, MD;  Location: [facility];  Service: Cardiovascular;  Laterality: N/A;     Current Outpatient Medications  Medication Sig Dispense Refill  . acetaminophen (TYLENOL) 325 MG tablet Take 2 tablets (650 mg total) by mouth every 4 (four) hours as needed for headache or mild pain.    . cholecalciferol (VITAMIN D3) 25 MCG (1000 UNIT) tablet Take 1,000 Units by mouth daily.    Marland Kitchen docusate sodium (COLACE) 100 MG capsule Take 1 capsule (100 mg total) by mouth 2 (two) times daily. 10 capsule 0  . guaiFENesin (ROBITUSSIN) 100 MG/5ML liquid Take 200 mg by mouth daily as needed for cough.    . pantoprazole (PROTONIX) 40 MG tablet Take 1 tablet (40 mg total) by mouth daily. 30 tablet 1  . polyethylene glycol (MIRALAX / GLYCOLAX) 17 g packet Take 17 g by mouth daily as needed (constipation). 14 each 0  . sacubitril-valsartan (ENTRESTO) 24-26 MG Take 1 tablet by mouth 2 (two) times daily. 60 tablet 6  . aspirin 81 MG EC tablet Take 1 tablet (81 mg total) by mouth daily. 90 tablet 3  . metoprolol tartrate (LOPRESSOR) 25 MG tablet Take 0.5 tablets (12.5 mg total) by mouth 2 (two) times daily. 90 tablet 3  . spironolactone (ALDACTONE) 25 MG tablet Take 1 tablet (25 mg total) by mouth daily. 90 tablet 3  . torsemide (DEMADEX) 20 MG tablet Take 1 tablet (20 mg total) by mouth daily. 90 tablet 3   No  current facility-administered medications for this visit.    Allergies:   Hyzaar [losartan potassium-hctz]    Social History:  The patient  reports that she has never smoked. She has never used smokeless tobacco. She reports that she does not drink alcohol or use drugs.   Family History:  The patient's family history includes Cancer in her maternal grandfather, maternal grandmother, mother, and paternal grandfather; Diabetes in her father, maternal grandfather, and paternal grandmother; Heart disease in her maternal grandfather; Hyperlipidemia in her brother, mother, and sister; Hypertension in her brother, father, maternal grandfather,  paternal grandfather, paternal grandmother, and sister; Stroke in her maternal grandmother and paternal grandfather.    ROS:  General:no colds or fevers,  weight stable from hospital to in clothes in office.  Skin:no rashes or ulcers HEENT:no blurred vision, no congestion CV:see HPI PUL:see HPI GI:no diarrhea constipation or melena, no indigestion GU:no hematuria, no dysuria MS:no joint pain, no claudication Neuro:no syncope, no lightheadedness, some memory issues. Endo:new diabetes, no thyroid disease  Wt Readings from Last 3 Encounters:  12/19/19 242 lb 6.4 oz (110 kg)  12/12/19 241 lb 9.6 oz (109.6 kg)  06/09/16 251 lb (113.9 kg)     PHYSICAL EXAM: VS:  BP 116/70   Pulse 82   Ht 5\' 4"  (1.626 m)   Wt 242 lb 6.4 oz (110 kg)   SpO2 98%   BMI 41.61 kg/m  , BMI Body mass index is 41.61 kg/m. General:Pleasant affect, NAD Skin:Warm and dry, brisk capillary refill HEENT:normocephalic, sclera clear, mucus membranes moist Neck:supple, no JVD, no bruits  Heart:S1S2 RRR without murmur, gallup, rub or click Lungs:clear without rales, rhonchi, or wheezes , non tender, + BS, do not palpate liver spleen or masses Ext:no lower ext edema, 2+ pedal pulses, 2+ radial pulses Neuro:alert and oriented X 3, MAE, follows commands, + facial symmetry    EKG:  EKG is NOT ordered today.    Recent Labs: 12/07/2019: B Natriuretic Peptide 862.0 12/08/2019: ALT 74; Magnesium 1.7; Platelets 225; TSH 2.987 12/09/2019: Hemoglobin 15.3 12/19/2019: BUN 25; Creatinine, Ser 1.27; NT-Pro BNP 957; Potassium 5.2; Sodium 144    Lipid Panel    Component Value Date/Time   CHOL 204 (H) 12/08/2019 0435   TRIG 82 12/08/2019 0435   HDL 65 12/08/2019 0435   CHOLHDL 3.1 12/08/2019 0435   VLDL 16 12/08/2019 0435   LDLCALC 123 (H) 12/08/2019 0435       Other studies Reviewed: Additional studies/ records that were reviewed today include: . Rt and Lt cardiac cath 12/09/19  Findings:  Ao =  131/86 (109) LV = 138/29 RA = 9 RV = 58/15 PA = 63/23 (38) PCW = 21 Fick cardiac output/index = 5.2/2.4 PVR = 3.2 WU FA sat = 93% PA sat = 62%, 67%  ABG: 7.27/88/81/93%  Assessment: 1. 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 Severe NICM EF 25% suspect due to HTN and undiagnosed OSA 3. Elevated filling pressures with normal cardiac output 4. OHS/OSA with CO2 retention (patient received 2mg  versed and 07-30-2003 fentanyl during cath)  Plan/Discussion:  Increase lasix to 80 IV bid and add metolazone (may need short course of Diamox). 80 IV lasix given in cath lab. Start Entresto (had nausea with losartan so should be ok). Will need aggressive BP control and initiation of CPAP in house.  Patient awake and talking despite PCO2 ~90. ABG nearly compensated so this is chronic. Will take to holding area and BIPAP briefly until pCO2 <80.   HF team happy  to assist with care as needed.   Arvilla Meres, MD 8:33 AM  Echo 12/08/19 IMPRESSIONS    1. Left ventricular ejection fraction, by estimation, is 35 to 40%. The  left ventricle has moderately decreased function. The left ventricle  demonstrates global hypokinesis. There is moderate left ventricular  hypertrophy. Left ventricular diastolic  parameters are consistent with Grade I diastolic dysfunction (impaired  relaxation).  2. Right ventricular systolic function is moderately reduced. The right  ventricular size is normal. There is moderately elevated pulmonary artery  systolic pressure. The estimated right ventricular systolic pressure is  50.5 mmHg.  3. Left atrial size was mildly dilated.  4. Right atrial size was mildly dilated.  5. The mitral valve is grossly normal. Trivial mitral valve  regurgitation.  6. The aortic valve is tricuspid. Aortic valve regurgitation is not  visualized. Mild aortic valve sclerosis is present, with no evidence of  aortic valve stenosis.  7. The inferior vena cava is dilated  in size with >50% respiratory  variability, suggesting right atrial pressure of 8 mmHg.   FINDINGS  Left Ventricle: Left ventricular ejection fraction, by estimation, is 35  to 40%. The left ventricle has moderately decreased function. The left  ventricle demonstrates global hypokinesis. The left ventricular internal  cavity size was normal in size.  There is moderate left ventricular hypertrophy. Left ventricular diastolic  parameters are consistent with Grade I diastolic dysfunction (impaired  relaxation).   Right Ventricle: The right ventricular size is normal. No increase in  right ventricular wall thickness. Right ventricular systolic function is  moderately reduced. There is moderately elevated pulmonary artery systolic  pressure. The tricuspid regurgitant  velocity is 3.26 m/s, and with an assumed right atrial pressure of 8 mmHg,  the estimated right ventricular systolic pressure is 50.5 mmHg.   Left Atrium: Left atrial size was mildly dilated.   Right Atrium: Right atrial size was mildly dilated.   Pericardium: There is no evidence of pericardial effusion.   Mitral Valve: The mitral valve is grossly normal. Trivial mitral valve  regurgitation.   Tricuspid Valve: The tricuspid valve is grossly normal. Tricuspid valve  regurgitation is mild.   Aortic Valve: The aortic valve is tricuspid. Aortic valve regurgitation is  not visualized. Mild aortic valve sclerosis is present, with no evidence  of aortic valve stenosis. Moderate aortic valve annular calcification.   Pulmonic Valve: The pulmonic valve was grossly normal. Pulmonic valve  regurgitation is trivial.   Aorta: The aortic root is normal in size and structure.   Venous: The inferior vena cava is dilated in size with greater than 50%  respiratory variability, suggesting right atrial pressure of 8 mmHg.   IAS/Shunts: No atrial level shunt detected by color flow Doppler.     LEFT VENTRICLE  PLAX 2D  LVIDd:      4.70 cm   Diastology  LVIDs:     3.80 cm   LV e' lateral:  8.59 cm/s  LV PW:     1.30 cm   LV E/e' lateral: 8.0  LV IVS:    1.40 cm   LV e' medial:  6.30 cm/s  LVOT diam:   2.00 cm   LV E/e' medial: 10.9  LV SV:     56  LV SV Index:  26  LVOT Area:   3.14 cm                 3D Volume EF:  LV Volumes (MOD)  3D EF:    39 %  LV vol d, MOD A2C: 129.0 ml LV EDV:    220 ml  LV vol d, MOD A4C: 164.0 ml LV ESV:    134 ml  LV vol s, MOD A2C: 68.5 ml LV SV:    86 ml  _____________  Venous dopplers 12/07/19 Summary:  BILATERAL:  - No evidence of deep vein thrombosis seen in the lower extremities,  bilaterally.   ASSESSMENT AND PLAN:  1.  Chronic systolic and diastolic HF wt is stable since discharge, will check BMP and pro BNP may still have fluid, though breathing stable.  Would like to refer to cardiac rehab for exercise and education but no insurance. Will see if AHF has recommendations.  She is walking for 10 min or so a day.   2.  Pulmonary HTN will loss will help medications.  Obesity hypoventilation   3.  Hypoxia desats with ambulation and at night. On home 02 asked her to wear to protect her heart, keeping oxygen levels WNL   --arrange sleep study for CPAP or BiPAP  4.  HTN controlled to soft.  Will hold off on increasing entresto currently - hopefully with AHF next week they will be able to increase  5.  Morbid obesity given DASH with low sodium diet.    6.  DM-2 will ask PCP to treat though wellness center.  farxiga would be beneficial - but she has no insurance, when she is seen in HF clinic they may have a fund to assist.    7.  Constipation she will continue stool softener and miralax    Current medicines are reviewed with the patient today.  The patient Has no concerns regarding medicines.  The following changes have been made:  See above Labs/ tests ordered today include:see  above  Disposition:   FU:  see above  Signed, Cecilie Kicks, NP  12/20/2019 6:46 PM    [facility] [place], [place], [place], [place] Phone: [phone]; Fax: [phone]

## 2019-12-20 ENCOUNTER — Encounter: Payer: Self-pay | Admitting: Cardiology

## 2019-12-20 ENCOUNTER — Other Ambulatory Visit (HOSPITAL_COMMUNITY): Payer: Self-pay | Admitting: Internal Medicine

## 2019-12-20 ENCOUNTER — Telehealth (HOSPITAL_COMMUNITY): Payer: Self-pay

## 2019-12-20 LAB — BASIC METABOLIC PANEL
BUN/Creatinine Ratio: 20 (ref 9–23)
BUN: 25 mg/dL — ABNORMAL HIGH (ref 6–24)
CO2: 33 mmol/L — ABNORMAL HIGH (ref 20–29)
Calcium: 9.9 mg/dL (ref 8.7–10.2)
Chloride: 97 mmol/L (ref 96–106)
Creatinine, Ser: 1.27 mg/dL — ABNORMAL HIGH (ref 0.57–1.00)
GFR calc Af Amer: 56 mL/min/{1.73_m2} — ABNORMAL LOW (ref >59)
GFR calc non Af Amer: 48 mL/min/{1.73_m2} — ABNORMAL LOW (ref >59)
Glucose: 107 mg/dL — ABNORMAL HIGH (ref 65–99)
Potassium: 5.2 mmol/L (ref 3.5–5.2)
Sodium: 144 mmol/L (ref 134–144)

## 2019-12-20 LAB — PRO B NATRIURETIC PEPTIDE: NT-Pro BNP: 957 pg/mL — ABNORMAL HIGH (ref 0–249)

## 2019-12-20 MED ORDER — ASPIRIN 81 MG PO TBEC: 81.0000 mg | DELAYED_RELEASE_TABLET | Freq: Every day | ORAL | 3 refills | Status: AC

## 2019-12-20 MED ORDER — SPIRONOLACTONE 25 MG PO TABS: 25.0000 mg | ORAL_TABLET | Freq: Every day | ORAL | 3 refills | Status: DC

## 2019-12-20 MED ORDER — METOPROLOL TARTRATE 25 MG PO TABS: 12.5000 mg | ORAL_TABLET | Freq: Two times a day (BID) | ORAL | 3 refills | Status: DC

## 2019-12-20 MED ORDER — TORSEMIDE 20 MG PO TABS: 20.0000 mg | ORAL_TABLET | Freq: Every day | ORAL | 3 refills | Status: DC

## 2019-12-20 NOTE — Telephone Encounter (Signed)
Heart Failure Stewardship Pharmacist  Transitions of Care Follow-up Call   PCP: Patient, No Pcp Per PCP-Cardiologist: Mertie Moores, MD    HPI and Hospital Course:  54 yo female with PMH significant for HTN and obesity admitted on 12/07/19 for shortness of breath and leg swelling over the last 2 weeks. She believed she had gained about 30 or more pounds in the past year, but wasn't sure whether she had gained more in the past few weeks. She reported having a poor diet with lots of snack/junk food. She was previously on medication for HTN, but she stopped this as she said she developed a cough. ECHO completed on 12/08/19 and found to have LVEF 35-40%. R/LHC on 12/09/19 showed minimally obstructive CAD and elevated filling pressures with normal cardiac output. RA 9, PA 38, PCWP 21, CO/CI 5.2/2.4. She received IV diuresis with metolazone and diamox throughout her hospitalization and lost 11 lbs of fluid. She was discharged on 12/12/19 from [facility].  She was last seen by Cecilie Kicks, NP on 12/19/19. Her BP in clinic was 116/70. She was continued on all medications and was started on KCl 20 mEq daily. BMET collected with improved SCr 1.27 but K of 5.2.  Overall, she is feeling very well. She reports minor dizziness but no falls. Her home health nurse checks her BP and last reported was around 110/70. She reports one brief episode of sharp chest pain last evening but this quickly resolved. She denies palpitations, lightheadedness, and fatigue. She says her breathing is much improved and sleeps better at night. She is able to complete all ADLs and has started walking 15 mins per day. She does not have a scale at home yet but should be receiving one from the HF clinic at her next visit. She takes torsemide 20 mg daily at home and denies PND or orthopnea, but reports minimal edema in her LE. Her appetite is good and she has been eating more vegetables and fruit. She is following a low sodium diet and taking all  medications as prescribed.    Pertinent Lab Values: . Serum creatinine 1.27, BUN 25, Potassium 5.2, Sodium 144, BNP 862  HF Medications: Torsemide 20 mg daily KCl 20 mEq daily Metoprolol tartrate 12.5 mg BID Entresto 24/26 mg BID Spironolactone 25 mg daily   Did the pt receive and understand the discharge instructions provided? yes  Have you obtained your medications from the pharmacy after your hospital discharge? No - sent to [facility] instead of [facility] outpatient pharmacy  Are you experiencing any side effects to your medications: no  Medication Assistance / Insurance Benefits Check: Does the patient have prescription insurance?   No Type of insurance plan: N/A  Does the patient qualify for medication assistance through manufacturers or grants? Yes  Eligible grants and/or patient assistance programs:  Entresto  Using HF fund with HF clinic for other medications  Medication assistance applications in progress: None  Medication assistance applications approved:  Entresto (approved through 12/15/20)  Approved medication assistance renewals will be completed by: HF Clinic  Assessment: 1. New onset systolic CHF (EF 67%), due to HTN and undiagnosed OSA. NYHA class II/III symptoms. - Continue torsemide 20 mg daily  - Continue metoprolol 12.5 mg BID - consider transitioning to metoprolol succinate at next visit - Continue Entresto 24/26 mg BID - Continue spironolactone 25 mg daily - watch potassium - recommended low K diet to patient. Consider stopping KCl. Repeat BMET at next visit - Consider adding Farxiga 10 mg daily  at next visit - Monitor chest pain and contact MD if this persists or worsens  Plan: 1) Medication changes recommended at this time: - Stop KCl supplement - Switch metoprolol tartrate to succinate at next visit - Conisder adding Farxiga 10 mg daily at next visit - Provider contacted: Dr. Graciela Husbands (Nada Boozer, NP was not in office today)  2) Patient assistance  application(s): - Entresto patient assistance application through [employer] approved through 12/15/20.  - Reordered medications through [facility] to utilize HF fund.   3) Education - Patient has been educated on current HF medications (torsemide, metoprolol, Entresto, spironolactone) and potential additions to HF medication regimen (Farxiga) - Patient verbalizes understanding that over the next few months, these medication doses may change and more medications may be added to optimize HF regimen - Patient has been educated on basic disease state pathophysiology and goals of therapy - Time spent (45 mins)   Danae Orleans, PharmD, BCPS HF Stewardship Pharmacist Phone [phone] 12/20/2019       11:06 AM

## 2019-12-24 NOTE — Progress Notes (Signed)
Patient ID: Maria Duncan, female   DOB: 1966-05-16, 54 y.o.   MRN: [id number]  Virtual Visit via Telephone Note  I connected with Joory Tax on 12/25/19 at  2:50 PM EDT by telephone and verified that I am speaking with the correct person using two identifiers.   I discussed the limitations, risks, security and privacy concerns of performing an evaluation and management service by telephone and the availability of in person appointments. I also discussed with the patient that there may be a patient responsible charge related to this service. The patient expressed understanding and agreed to proceed.  PATIENT visit by telephone virtually in the context of Covid-19 pandemic. Patient location:  home My Location:  [facility] office Persons on the call:  Me and the patient   History of Present Illness: After hospitalization 5/15-5/20/2021.  She is doing well.  She has already had f/up and f/up blood work with cardiology.  She is eating less sugar and wants to avoid more meds if possible.  She does not have a glucometer.  Blood sugar 12/19/2019=107.  She says she is aggressively changing her diet.  She denies CP, SOB.  A1C=7.1.  GFR and Cr abnormal.    From discharge summary: Principal Problem:   Acute combined systolic and diastolic HF (heart failure) (HCC) Active Problems:   CHF exacerbation (HCC)   Cardiomegaly   Essential hypertension   CHF (congestive heart failure) (HCC)   NICM (nonischemic cardiomyopathy) (HCC)   Morbid obesity (HCC)   Chest pain of uncertain etiology, non obstructive CAD, pain due to acute HF   Pulmonary hypertension, unspecified (HCC)   OSA (obstructive sleep apnea)   Hypoventilation associated with obesity ([place])   CAD in native artery non obstructive on cath 12/09/19  From cardiology f/up 12/19/2019: History of Present Illness: Maria Duncan is a 54 y.o. female who presents for post hospitalization acute systolic and diastolic HF, NICM EF 37% with cath, morbid obesity,  pulmonary hypertension, OSA arranging sleep study but discharged on home oxygen with desat with ambulation. Hypoventilation associated with obesity, mild CAD on cath.   She was diuresed - also found to be diabetic and PCP through [facility] [employer] [facility], would like farxiga but currently finances are limited.  She was able to arrange meds at discharge.  She will see AHF for further recommendations and assistance with meds.  She has enough for 1 month.  We were able to add entresto.  Would like to increase but in hospital BP down to 80 systolic at higher dose. But plan to increase.  She was neg 9165 cc at discharge and wt 109.6 Kg.    She has been doing well, she - has had some memory issues numbers for alarm- at house and a few other moments of forgetting.  Sleeps on 2 pillows overall wt is stable.   Assessment: 1. 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 Severe NICM EF 25% suspect due to HTN and undiagnosed OSA 3. Elevated filling pressures with normal cardiac output 4. OHS/OSA with CO2 retention (patient received '2mg'$  versed and 71mg fentanyl during cath)  Plan/Discussion: Increase lasix to 80 IV bid and add metolazone (may need short course of Diamox). 80 IV lasix given in cath lab. Start Entresto (had nausea with losartan so should be ok). Will need aggressive BP control and initiation of CPAP in house.  Patient awake and talking despite PCO2 ~90. ABG nearly compensated so this is chronic. Will take to holding area and BIPAP briefly until pCO2 <80.  HF team happy to assist with care as needed.   Observations/Objective: NAD.  studder is present.  A&Ox3  Assessment and Plan: 1. Type 2 diabetes mellitus with hyperglycemia, unspecified whether long term insulin use ([place]) She is aggressively working on her diet and blood sugars already coming down.  GFR compromised in hospitalization;  Will assess control in 3 weeks-consider basaglar or lantus if needed.  Check blood sugars bid.  If higher  than 200, call for sooner appt.   - Blood Glucose Monitoring Suppl (TRUE METRIX METER) w/Device KIT; 1 each by Does not apply route in the morning and at bedtime.  Dispense: 1 kit; Refill: 0 - glucose blood (TRUE METRIX BLOOD GLUCOSE TEST) test strip; Use as instructed  Dispense: 100 each; Refill: 12 - TRUEplus Lancets 28G MISC; 1 each by Does not apply route in the morning and at bedtime.  Dispense: 100 each; Refill: 1  2. Chest pain of uncertain etiology, non obstructive CAD, pain due to acute HF resolved  3. Acute combined systolic and diastolic HF (heart failure) (HCC) Continue current regimen, weights, and f/up with cardiology  4. Essential hypertension Continue current regimen.  Close f/up cardiology  5. Hospital discharge follow-up Doing well;  Feels good overall and taking meds   Follow Up Instructions: Assign PCP in 3 weeks   I discussed the assessment and treatment plan with the patient. The patient was provided an opportunity to ask questions and all were answered. The patient agreed with the plan and demonstrated an understanding of the instructions.   The patient was advised to call back or seek an in-person evaluation if the symptoms worsen or if the condition fails to improve as anticipated.  I provided 15 minutes of non-face-to-face time during this encounter.   [name], PA-C

## 2019-12-25 ENCOUNTER — Other Ambulatory Visit: Payer: Self-pay

## 2019-12-25 ENCOUNTER — Telehealth: Payer: Self-pay | Admitting: *Deleted

## 2019-12-25 ENCOUNTER — Ambulatory Visit: Payer: Self-pay | Attending: Family Medicine | Admitting: Physician Assistant

## 2019-12-25 DIAGNOSIS — Z09 Encounter for follow-up examination after completed treatment for conditions other than malignant neoplasm: Secondary | ICD-10-CM

## 2019-12-25 DIAGNOSIS — E1165 Type 2 diabetes mellitus with hyperglycemia: Secondary | ICD-10-CM

## 2019-12-25 DIAGNOSIS — I10 Essential (primary) hypertension: Secondary | ICD-10-CM

## 2019-12-25 DIAGNOSIS — I5041 Acute combined systolic (congestive) and diastolic (congestive) heart failure: Secondary | ICD-10-CM

## 2019-12-25 DIAGNOSIS — R079 Chest pain, unspecified: Secondary | ICD-10-CM

## 2019-12-25 MED ORDER — TRUEPLUS LANCETS 28G MISC: 1.0000 | Freq: Two times a day (BID) | 1 refills | Status: DC

## 2019-12-25 MED ORDER — TRUE METRIX BLOOD GLUCOSE TEST VI STRP: ORAL_STRIP | 12 refills | Status: DC

## 2019-12-25 MED ORDER — TRUE METRIX METER W/DEVICE KIT: 1.0000 | PACK | Freq: Two times a day (BID) | 0 refills | Status: DC

## 2019-12-25 MED FILL — TRUE METRIX TEST STRIP: 25 days supply | Qty: 100 | Fill #0

## 2019-12-25 MED FILL — TRUEplus LANCETS 28G MISC: 50 days supply | Qty: 100 | Fill #0

## 2019-12-25 MED FILL — !TRUE METRIX BLOOD GLUCOSE: 1 days supply | Qty: 1 | Fill #0

## 2019-12-25 NOTE — Progress Notes (Signed)
ADVANCED HF CLINIC   Primary Care: [facility].  Primary HF Cardiologist: Dr Haroldine Laws  HPI: Ms Hehn is  54 year old with a history of HTN, obesity, and recently diagnosed NICM/systolic heart failure.   Admitted 11/2019 with increased SOB and leg edema. ECHO performed and showed reduced EF. Had RHC/LHC with minimal CAD and elevated filling pressures and preserved cardiac output. Diuresed with IV lasix and transitioned to torsemide 20 mg daily. Also concern for sleep apnea.   Today she returns for post hospital follow up. Overall feeling fine. SOB with exertion. Denies PND/Orthopnea. Using 3 liters oxygen at night. Coughing a lot.  Appetite ok. Likes salty chips and pretzels.  No fever or chills. Weight at home  pounds. Taking all medications. Has been approved for entresto assistance. Lives with her husband.   She has no insurance.   ECHO 11/2019 Left ventricular ejection fraction, by estimation, is 35 to 40%. The left ventricle has moderately decreased function. The left ventricle demonstrates global hypokinesis. There is moderate left ventricular hypertrophy. Left ventricular diastolic parameters are consistent with Grade I diastolic dysfunction (impaired relaxation).  2. Right ventricular systolic function is moderately reduced.   RHC/LHC 11/2019 Ao = 131/86 (109) LV = 138/29 RA = 9 RV = 58/15 PA = 63/23 (38) PCW = 21 Fick cardiac output/index = 5.2/2.4 PVR = 3.2 WU FA sat = 93% PA sat = 62%, 67% ABG: 7.27/88/81/93% Assessment: 1. 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 Severe NICM EF 25% suspect due to HTN and undiagnosed OSA 3. Elevated filling pressures with normal cardiac output 4. OHS/OSA with CO2 retention (patient received '2mg'$  versed and 54mg fentanyl during cath)    Review of Systems: [y] = yes, '[ ]'$  = no   General: Weight gain '[ ]'$ ; Weight loss '[ ]'$ ; Anorexia '[ ]'$ ; Fatigue '[ ]'$ ; Fever '[ ]'$ ; Chills '[ ]'$ ; Weakness '[ ]'$   Cardiac: Chest  pain/pressure '[ ]'$ ; Resting SOB '[ ]'$ ; Exertional SOB [Y ]; Orthopnea '[ ]'$ ; Pedal Edema '[ ]'$ ; Palpitations '[ ]'$ ; Syncope '[ ]'$ ; Presyncope '[ ]'$ ; Paroxysmal nocturnal dyspnea'[ ]'$   Pulmonary: Cough [Jazmín.Cullens]; Wheezing'[ ]'$ ; Hemoptysis'[ ]'$ ; Sputum '[ ]'$ ; Snoring '[ ]'$   GI: Vomiting'[ ]'$ ; Dysphagia'[ ]'$ ; Melena'[ ]'$ ; Hematochezia '[ ]'$ ; Heartburn'[ ]'$ ; Abdominal pain '[ ]'$ ; Constipation '[ ]'$ ; Diarrhea '[ ]'$ ; BRBPR '[ ]'$   GU: Hematuria'[ ]'$ ; Dysuria '[ ]'$ ; Nocturia'[ ]'$   Vascular: Pain in legs with walking '[ ]'$ ; Pain in feet with lying flat '[ ]'$ ; Non-healing sores '[ ]'$ ; Stroke '[ ]'$ ; TIA '[ ]'$ ; Slurred speech '[ ]'$ ;  Neuro: Headaches'[ ]'$ ; Vertigo'[ ]'$ ; Seizures'[ ]'$ ; Paresthesias'[ ]'$ ;Blurred vision '[ ]'$ ; Diplopia '[ ]'$ ; Vision changes '[ ]'$   Ortho/Skin: Arthritis '[ ]'$ ; Joint pain [Y ]; Muscle pain '[ ]'$ ; Joint swelling '[ ]'$ ; Back Pain [Y ]; Rash '[ ]'$   Psych: Depression'[ ]'$ ; Anxiety'[ ]'$   Heme: Bleeding problems '[ ]'$ ; Clotting disorders '[ ]'$ ; Anemia '[ ]'$   Endocrine: Diabetes [Y ]; Thyroid dysfunction'[ ]'$    Past Medical History:  Diagnosis Date  . Acute combined systolic and diastolic HF (heart failure) ([place]   . Chest pain of uncertain etiology, non obstructive CAD, pain due to acute HF 12/12/2019  . Hypertension   . Hypoventilation associated with obesity ([facility] 12/12/2019  . Morbid obesity ([facility]   . NICM (nonischemic cardiomyopathy) ([facility]   . OSA (obstructive sleep apnea) 12/12/2019  . Pulmonary hypertension, unspecified ([facility] 12/12/2019    Current Outpatient Medications  Medication Sig Dispense Refill  .  acetaminophen (TYLENOL) 325 MG tablet Take 2 tablets (650 mg total) by mouth every 4 (four) hours as needed for headache or mild pain.    Marland Kitchen aspirin 81 MG EC tablet Take 1 tablet (81 mg total) by mouth daily. 90 tablet 3  . Blood Glucose Monitoring Suppl (TRUE METRIX METER) w/Device KIT 1 each by Does not apply route in the morning and at bedtime. 1 kit 0  . cholecalciferol (VITAMIN D3) 25 MCG (1000 UNIT) tablet Take 1,000 Units by mouth daily.    Marland Kitchen docusate sodium  (COLACE) 100 MG capsule Take 1 capsule (100 mg total) by mouth 2 (two) times daily. 10 capsule 0  . glucose blood (TRUE METRIX BLOOD GLUCOSE TEST) test strip Use as instructed 100 each 12  . metoprolol tartrate (LOPRESSOR) 25 MG tablet Take 0.5 tablets (12.5 mg total) by mouth 2 (two) times daily. 90 tablet 3  . pantoprazole (PROTONIX) 40 MG tablet Take 1 tablet (40 mg total) by mouth daily. 30 tablet 1  . polyethylene glycol (MIRALAX / GLYCOLAX) 17 g packet Take 17 g by mouth daily as needed (constipation). 14 each 0  . sacubitril-valsartan (ENTRESTO) 24-26 MG Take 1 tablet by mouth 2 (two) times daily. 60 tablet 6  . spironolactone (ALDACTONE) 25 MG tablet Take 1 tablet (25 mg total) by mouth daily. 90 tablet 3  . torsemide (DEMADEX) 20 MG tablet Take 1 tablet (20 mg total) by mouth daily. 90 tablet 3  . TRUEplus Lancets 28G MISC 1 each by Does not apply route in the morning and at bedtime. 100 each 1  . guaiFENesin (ROBITUSSIN) 100 MG/5ML liquid Take 200 mg by mouth daily as needed for cough.     No current facility-administered medications for this encounter.    Allergies  Allergen Reactions  . Hyzaar [Losartan Potassium-Hctz] Nausea And Vomiting and Cough      Social History   Socioeconomic History  . Marital status: Married    Spouse name: Not on file  . Number of children: Not on file  . Years of education: Not on file  . Highest education level: Not on file  Occupational History  . Not on file  Tobacco Use  . Smoking status: Never Smoker  . Smokeless tobacco: Never Used  Substance and Sexual Activity  . Alcohol use: No  . Drug use: No  . Sexual activity: Not on file  Other Topics Concern  . Not on file  Social History Narrative  . Not on file   Social Determinants of Health   Financial Resource Strain:   . Difficulty of Paying Living Expenses:   Food Insecurity:   . Worried About Charity fundraiser in the Last Year:   . Arboriculturist in the Last Year:     Transportation Needs:   . Film/video editor (Medical):   Marland Kitchen Lack of Transportation (Non-Medical):   Physical Activity:   . Days of Exercise per Week:   . Minutes of Exercise per Session:   Stress:   . Feeling of Stress :   Social Connections:   . Frequency of Communication with Friends and Family:   . Frequency of Social Gatherings with Friends and Family:   . Attends Religious Services:   . Active Member of Clubs or Organizations:   . Attends Archivist Meetings:   Marland Kitchen Marital Status:   Intimate Partner Violence:   . Fear of Current or Ex-Partner:   . Emotionally Abused:   .  Physically Abused:   . Sexually Abused:       Family History  Problem Relation Age of Onset  . Cancer Mother   . Hyperlipidemia Mother   . Diabetes Father   . Hypertension Father   . Hyperlipidemia Sister   . Hypertension Sister   . Hyperlipidemia Brother   . Hypertension Brother   . Cancer Maternal Grandmother   . Stroke Maternal Grandmother   . Cancer Maternal Grandfather   . Diabetes Maternal Grandfather   . Heart disease Maternal Grandfather   . Hypertension Maternal Grandfather   . Diabetes Paternal Grandmother   . Hypertension Paternal Grandmother   . Cancer Paternal Grandfather   . Hypertension Paternal Grandfather   . Stroke Paternal Grandfather     Vitals:   12/26/19 0838  BP: (!) 112/94  Pulse: 87  SpO2: 92%  Weight: 110.1 kg   Wt Readings from Last 3 Encounters:  12/26/19 110.1 kg  12/19/19 110 kg  12/12/19 109.6 kg    PHYSICAL EXAM: General:  Well appearing. No respiratory difficulty HEENT: normal Neck: supple. JVP 8-9 no JVD. Carotids 2+ bilat; no bruits. No lymphadenopathy or thryomegaly appreciated. Cor: PMI nondisplaced. Regular rate & rhythm. No rubs, gallops or murmurs. Lungs: clear Abdomen: soft, nontender, nondistended. No hepatosplenomegaly. No bruits or masses. Good bowel sounds. Extremities: no cyanosis, clubbing, rash, R and LLE trace-1+  edema Neuro: alert & oriented x 3, cranial nerves grossly intact. moves all 4 extremities w/o difficulty. Affect pleasant.   ASSESSMENT & PLAN:  1. Chronic Systolic Heart Failure, nicm   ECHO 11/2019 EF 30-35% RV moderately reduced.  Cath 11/2019 with minimal cardiac disease and preserved cardiac output. EF on cath 25%.  NYHA III. Volume status elevated suspect in the setting of high sodium diet. Increase torsemide to 20 mg twice a day.  -With reduced EF will stop lopressor and start carvedilol 6.25 mg twice a day.  -On entresto 24-26 mg twice a day  -Continue Spiro 25 mg daily - Plan to repeat ECHO in 3 months.  - Discussed low salt food choices.   2. HTN Elevated.  3. Obesity  Body mass index is 41.68 kg/m. Discussed portion control.  4. Suspected Sleep Apnea  -Needs sleep study but does not have insurance.  5. Hyperkalemia  Recent elevated K.  - Check BMET today.   6. DMII Followed by PCP  No medical insurance. Lots of questions about medications. Referred to HFSW. Refer to HF Paramedic.    Follow up in 2-3 weeks. Plan to check BMET at that time.   Mohamadou Maciver NP-C  11:38 AM

## 2019-12-25 NOTE — Telephone Encounter (Signed)
Staff message sent to [place]. Ok to schedule sleep study. Per chart patient is uninsured.

## 2019-12-26 ENCOUNTER — Encounter (HOSPITAL_COMMUNITY): Payer: Self-pay

## 2019-12-26 ENCOUNTER — Other Ambulatory Visit: Payer: Self-pay

## 2019-12-26 ENCOUNTER — Ambulatory Visit (HOSPITAL_COMMUNITY)
Admission: RE | Admit: 2019-12-26 | Discharge: 2019-12-26 | Disposition: A | Discharge status: Home / Self Care | Payer: Self-pay | Source: Ambulatory Visit | Attending: Adult Health | Admitting: Adult Health

## 2019-12-26 VITALS — BP 112/94 | HR 87 | Wt 242.8 lb

## 2019-12-26 DIAGNOSIS — Z833 Family history of diabetes mellitus: Secondary | ICD-10-CM | POA: Insufficient documentation

## 2019-12-26 DIAGNOSIS — I428 Other cardiomyopathies: Secondary | ICD-10-CM

## 2019-12-26 DIAGNOSIS — Z888 Allergy status to other drugs, medicaments and biological substances status: Secondary | ICD-10-CM | POA: Insufficient documentation

## 2019-12-26 DIAGNOSIS — G4733 Obstructive sleep apnea (adult) (pediatric): Secondary | ICD-10-CM

## 2019-12-26 DIAGNOSIS — I5042 Chronic combined systolic (congestive) and diastolic (congestive) heart failure: Secondary | ICD-10-CM

## 2019-12-26 DIAGNOSIS — I10 Essential (primary) hypertension: Secondary | ICD-10-CM

## 2019-12-26 DIAGNOSIS — E662 Morbid (severe) obesity with alveolar hypoventilation: Secondary | ICD-10-CM | POA: Insufficient documentation

## 2019-12-26 DIAGNOSIS — E875 Hyperkalemia: Secondary | ICD-10-CM | POA: Insufficient documentation

## 2019-12-26 DIAGNOSIS — Z8249 Family history of ischemic heart disease and other diseases of the circulatory system: Secondary | ICD-10-CM | POA: Insufficient documentation

## 2019-12-26 DIAGNOSIS — Z7982 Long term (current) use of aspirin: Secondary | ICD-10-CM | POA: Insufficient documentation

## 2019-12-26 DIAGNOSIS — I2510 Atherosclerotic heart disease of native coronary artery without angina pectoris: Secondary | ICD-10-CM | POA: Insufficient documentation

## 2019-12-26 DIAGNOSIS — I110 Hypertensive heart disease with heart failure: Secondary | ICD-10-CM | POA: Insufficient documentation

## 2019-12-26 DIAGNOSIS — Z6841 Body Mass Index (BMI) 40.0 and over, adult: Secondary | ICD-10-CM | POA: Insufficient documentation

## 2019-12-26 DIAGNOSIS — E119 Type 2 diabetes mellitus without complications: Secondary | ICD-10-CM | POA: Insufficient documentation

## 2019-12-26 DIAGNOSIS — I272 Pulmonary hypertension, unspecified: Secondary | ICD-10-CM | POA: Insufficient documentation

## 2019-12-26 DIAGNOSIS — Z79899 Other long term (current) drug therapy: Secondary | ICD-10-CM | POA: Insufficient documentation

## 2019-12-26 LAB — BASIC METABOLIC PANEL
Anion gap: 9 (ref 5–15)
BUN: 19 mg/dL (ref 6–20)
CO2: 31 mmol/L (ref 22–32)
Calcium: 9.4 mg/dL (ref 8.9–10.3)
Chloride: 98 mmol/L (ref 98–111)
Creatinine, Ser: 1.06 mg/dL — ABNORMAL HIGH (ref 0.44–1.00)
GFR calc Af Amer: >60 mL/min (ref >60)
GFR calc non Af Amer: 60 mL/min — ABNORMAL LOW (ref >60)
Glucose, Bld: 132 mg/dL — ABNORMAL HIGH (ref 70–99)
Potassium: 4.2 mmol/L (ref 3.5–5.1)
Sodium: 138 mmol/L (ref 135–145)

## 2019-12-26 MED ORDER — TORSEMIDE 20 MG PO TABS: 20.0000 mg | ORAL_TABLET | Freq: Two times a day (BID) | ORAL | 3 refills | Status: DC

## 2019-12-26 MED ORDER — CARVEDILOL 6.25 MG PO TABS
6.2500 mg | ORAL_TABLET | Freq: Two times a day (BID) | ORAL | 3 refills | Status: DC
Start: 2019-12-26 — End: 2020-04-23

## 2019-12-26 MED FILL — CARVEDILOL 6.25 MG TABLET: 6.25 | 30 days supply | Qty: 60 | Fill #0

## 2019-12-26 MED FILL — TORSEMIDE 20 MG TABLET: 20 | 30 days supply | Qty: 60 | Fill #0

## 2019-12-26 NOTE — Progress Notes (Signed)
Paramedicine Initial Assessment:  Housing:  In what kind of housing do you live? House/apt/trailer/shelter? house  Do you rent/pay a mortgage/own? Rent, $1,225  Do you live with anyone? spouse  Are you currently worried about losing your housing? Pt and husband do report concerns with losing their house once protections for COVID are over as they are having trouble paying their rent and utilities at this time.  CSW provided with list of crisis assistance options including [employer] and [employer].  Also directed to call DSS to discuss assistance and the [employer] for utility/rent.  Within the past 12 months have you ever stayed outside, in a car, tent, a shelter, or temporarily with someone? no  Within the past 12 months have you been unable to get utilities when it was really needed? no  Social:  What is your current marital status? married  Do you have any children? Daughter   Do you have family or friends who live locally? Daughter and two grandkids  Food:  Within the past 12 months were you ever worried that food would run out before you got money to buy more? no  Within the past 84months have you run out of food and didn't have money to buy more? no  Income:  What is your current source of income? None at this time.  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  They have had to shut down the restaurant because the patient is the main cook so they have not had a source of income for the past month.  The husband has been taking out loans to help pay their business and home bills but is worried about how he will eventually pay those off.  How hard is it for you to pay for the basics like food housing, medical care, and utilities? Very hard  Do you have outstanding medical bills? yes  Insurance:  Are you currently insured? no  Do you have  prescription coverage? no  If no insurance, have you applied for coverage (Medicaid, disability, marketplace etc)? Have the link to apply for Medicaid and plan to do so shortly.  I supplied them with [employer] application and CAFA application to complete and turn back in with help of [facility] Development worker, community.  Transportation:  Do you have transportation to your medical appointments? Yes   If yes, how? Have a private vehicle and can get to appts and pick up medications etc.  In the past 12 months has lack of transportation kept you from medical appts or from getting medications? no  In the past 12 months has lack of transportation kept you from meetings, work, or getting things you needed? no   Daily Health Needs: Do you have a working scale at home? Provided a scale during visit as well ordered a BP cuff  How do you manage your medications at home? Wasn't on medications prior to admission so has just been taking out of the bottle and following the directions on there.  Feels like she is doing ok with this but likes the idea of someone coming out and helping her keep everything straight as she is new to all of this.  Do you ever take your medications differently than prescribed? no  Do you have issues affording your medications? Unsure- she still has the first 30 days of her meds  from the hospital so unsure how much things will cost from the normal pharmacy.  Discussed possible Blue Card application with [facility] as she established PCP with them yesterday.  Also had HF meds sent to the Outpt pharmacy under HF fund.  Pt already approved for [employer]- will get first shipment of Entresto 6/8  If yes, has this ever prevented you from obtaining medications? n/a  Do you have any concerns with mobility at home? no  Do you use any assistive devices at home or have PCS at home? no  Do you have a PCP? Had first appt with [facility] yesterday   Are there any additional barriers you see to getting the care  you need? Not at this time.  Mainly concerned about finances.   CSW will continue to follow through paramedicine program and assist as needed.  Burna Sis, LCSW Clinical Social Worker [facility] Desk#: [phone] Cell#: [phone]

## 2019-12-26 NOTE — Patient Instructions (Addendum)
STOP Metoprolol Tartrate (Lopressor)  START Carvedilol (Coreg) 6.25mg  (1 tab) twice a day  INCREASE Torsemide to 20mg  (1 tab) twice a day  Labs today We will only contact you if something comes back abnormal or we need to make some changes. Otherwise no news is good news!  Your physician recommends that you schedule a follow-up appointment in: 3 weeks with the Pharmacist  Please call office at [phone] option 2 if you have any questions or concerns.   At the [facility], you and your health needs are our priority. As part of our continuing mission to provide you with exceptional heart care, we have created designated Provider Care Teams. These Care Teams include your primary Cardiologist (physician) and Advanced Practice Providers (APPs- Physician Assistants and Nurse Practitioners) who all work together to provide you with the care you need, when you need it.   You may see any of the following providers on your designated Care Team at your next follow up: [phone] Dr Marland Kitchen . Dr Arvilla Meres . Marca Ancona, NP . Tonye Becket, PA . Robbie Lis, PharmD   Please be sure to bring in all your medications bottles to every appointment.

## 2020-01-01 ENCOUNTER — Telehealth (HOSPITAL_COMMUNITY): Payer: Self-pay

## 2020-01-01 NOTE — Telephone Encounter (Signed)
I called Maria Duncan to schedule our initial visit. She stated she preferred early morning visits so we agreed to meet Friday at 08:30.  [name], EMT 01/01/20

## 2020-01-02 ENCOUNTER — Telehealth (HOSPITAL_COMMUNITY): Payer: Self-pay | Admitting: Licensed Clinical Social Worker

## 2020-01-02 ENCOUNTER — Telehealth: Payer: Self-pay | Admitting: *Deleted

## 2020-01-02 NOTE — Telephone Encounter (Signed)
-----   Message from Gaynelle Cage, CMA sent at 12/25/2019  3:41 PM EDT ----- Regarding: RE: Sleep Study Ok to schedule. Per chart patient has no insurance. ----- Message ----- From: Elliot Cousin, RMA Sent: 12/19/2019   3:47 PM EDT To: [facility] Subject: Sleep Study                                    Pt needs a sleep study.  Thanks!

## 2020-01-02 NOTE — Telephone Encounter (Signed)
Patient is scheduled for lab study on 02/20/20. pt is scheduled for COVID screening on 02/17/20 12 pm prior to ss.  Patient understands her sleep study will be done at [facility] sleep lab. Patient understands she will receive a sleep packet in a week or so. Patient understands to call if she does not receive the sleep packet in a timely manner. Patient agrees with treatment and thanked me for call.

## 2020-01-02 NOTE — Telephone Encounter (Signed)
CSW called pt to check in regarding how charity applications were going.  Pt reports her and her husband have been working on the applications for utility assistance and rental assistance but have not heard anything back yet.  Pt was somewhat confused regarding charity care which took care of her home health services with [facility] and a charity for her hospital bills.  CSW explained that charity care only covers the home health bills and that there are not outside charities that will pick up her hospital bill and that she would need to complete the [facility] Financial assistance application that I provided to her and her husband last visit.  Pt expressed understanding and has no further questions at this time  CSW will continue to follow and assist as needed  Burna Sis, LCSW Clinical Social Worker [facility] Desk#: [phone] Cell#: [phone]

## 2020-01-03 ENCOUNTER — Other Ambulatory Visit (HOSPITAL_COMMUNITY): Payer: Self-pay

## 2020-01-03 NOTE — Progress Notes (Signed)
Paramedicine Encounter    Patient ID: Io Dieujuste, female    DOB: 1965/12/06, 54 y.o.   MRN: [id number]   Patient Care Team: Patient, No Pcp Per as PCP - General (General Practice) Nahser, Deloris Ping, MD as PCP - Cardiology (Cardiology)  Patient Active Problem List   Diagnosis Date Noted  . Chest pain of uncertain etiology, non obstructive CAD, pain due to acute HF 12/12/2019  . Pulmonary hypertension, unspecified (HCC) 12/12/2019  . OSA (obstructive sleep apnea) 12/12/2019  . Hypoventilation associated with obesity (HCC) 12/12/2019  . CAD in native artery non obstructive on cath 12/09/19 12/12/2019  . NICM (nonischemic cardiomyopathy) (HCC)   . 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 Morbid obesity (HCC)   . CHF exacerbation (HCC) 12/07/2019  . Cardiomegaly 12/07/2019  . Essential hypertension 12/07/2019  . CHF (congestive heart failure) (HCC) 12/07/2019    Current Outpatient Medications:  .  acetaminophen (TYLENOL) 325 MG tablet, Take 2 tablets (650 mg total) by mouth every 4 (four) hours as needed for headache or mild pain., Disp:  , Rfl:  .  aspirin 81 MG EC tablet, Take 1 tablet (81 mg total) by mouth daily., Disp: 90 tablet, Rfl: 3 .  Blood Glucose Monitoring Suppl (TRUE METRIX METER) w/Device KIT, 1 each by Does not apply route in the morning and at bedtime., Disp: 1 kit, Rfl: 0 .  carvedilol (COREG) 6.25 MG tablet, Take 1 tablet (6.25 mg total) by mouth 2 (two) times daily., Disp: 60 tablet, Rfl: 3 .  cholecalciferol (VITAMIN D3) 25 MCG (1000 UNIT) tablet, Take 1,000 Units by mouth daily., Disp: , Rfl:  .  docusate sodium (COLACE) 100 MG capsule, Take 1 capsule (100 mg total) by mouth 2 (two) times daily., Disp: 10 capsule, Rfl: 0 .  guaiFENesin (ROBITUSSIN) 100 MG/5ML liquid, Take 200 mg by mouth daily as needed for cough., Disp: , Rfl:  .  pantoprazole (PROTONIX) 40 MG tablet, Take 1 tablet (40 mg total) by mouth daily., Disp: 30 tablet, Rfl: 1 .   polyethylene glycol (MIRALAX / GLYCOLAX) 17 g packet, Take 17 g by mouth daily as needed (constipation)., Disp: 14 each, Rfl: 0 .  sacubitril-valsartan (ENTRESTO) 24-26 MG, Take 1 tablet by mouth 2 (two) times daily., Disp: 60 tablet, Rfl: 6 .  spironolactone (ALDACTONE) 25 MG tablet, Take 1 tablet (25 mg total) by mouth daily., Disp: 90 tablet, Rfl: 3 .  torsemide (DEMADEX) 20 MG tablet, Take 1 tablet (20 mg total) by mouth 2 (two) times daily., Disp: 60 tablet, Rfl: 3 .  TRUEplus Lancets 28G MISC, 1 each by Does not apply route in the morning and at bedtime., Disp: 100 each, Rfl: 1 .  glucose blood (TRUE METRIX BLOOD GLUCOSE TEST) test strip, Use as instructed, Disp: 100 each, Rfl: 12 Allergies  Allergen Reactions  . Hyzaar [Losartan Potassium-Hctz] Nausea And Vomiting and Cough      Social History   Socioeconomic History  . Marital status: Married    Spouse name: Not on file  . Number of children: Not on file  . Years of education: Not on file  . Highest education level: Not on file  Occupational History  . Not on file  Tobacco Use  . Smoking status: Never Smoker  . Smokeless tobacco: Never Used  Substance and Sexual Activity  . Alcohol use: No  . Drug use: No  . Sexual activity: Not on file  Other Topics Concern  . Not on  file  Social History Narrative  . Not on file   Social Determinants of Health   Financial Resource Strain:   . Difficulty of Paying Living Expenses:   Food Insecurity:   . Worried About Charity fundraiser in the Last Year:   . Arboriculturist in the Last Year:   Transportation Needs:   . Film/video editor (Medical):   Marland Kitchen Lack of Transportation (Non-Medical):   Physical Activity:   . Days of Exercise per Week:   . Minutes of Exercise per Session:   Stress:   . Feeling of Stress :   Social Connections:   . Frequency of Communication with Friends and Family:   . Frequency of Social Gatherings with Friends and Family:   . Attends Religious  Services:   . Active Member of Clubs or Organizations:   . Attends Archivist Meetings:   Marland Kitchen Marital Status:   Intimate Partner Violence:   . Fear of Current or Ex-Partner:   . Emotionally Abused:   Marland Kitchen Physically Abused:   . Sexually Abused:     Physical Exam Cardiovascular:     Rate and Rhythm: Normal rate and regular rhythm.     Pulses: Normal pulses.  Pulmonary:     Effort: Pulmonary effort is normal.     Breath sounds: Normal breath sounds.  Musculoskeletal:        General: Normal range of motion.     Right lower leg: Edema present.     Left lower leg: Edema present.  Skin:    General: Skin is warm and dry.     Capillary Refill: Capillary refill takes less than 2 seconds.  Neurological:     Mental Status: She is alert and oriented to person, place, and time.  Psychiatric:        Mood and Affect: Mood normal.         Future Appointments  Date Time Provider [place]  01/14/2020 12:00 PM [facility] PHARMACY [facility] None  01/18/2020 12:00 PM [facility]ENING [facility] None  01/21/2020  9:40 AM Nahser, Wonda Cheng, MD [facility]  01/21/2020  8:00 PM Sueanne Margarita, MD [facility]  01/29/2020  8:00 AM Ardelle Lesches, RD [facility] NDM    BP 108/80 (BP Location: Left Arm, Patient Position: Sitting, Cuff Size: Large)   Pulse 67   Resp 18   Wt 242 lb (109.8 kg)   SpO2 98%   BMI 41.54 kg/m   Weight yesterday- 241 lb Last visit weight- N/A   Ms Doty was seen at home today for our initial visit. She reported feeling generally well, denying chest pain, SOB, headache, dizziness, orthopnea, fever of cough over the past week. She reported being compliant with her medications and advised her weight has been stable. This was our first meeting so the majority of our time was spent discussing her medication regimen and giving her broad strokes about their purposes. Additionally we discussed her [facility] assistance applications and I helped her get some of the  information together however they did not have all the necessary documents so I will return next week to pick them up and deliver them to [place], LCSW. Her medications were verified and she was aided in refilling her pillbox. I will follow up next week.   Jacquiline Doe, EMT 01/03/20  ACTION: Home visit completed Next visit planned for 1 week

## 2020-01-07 MED FILL — SPIRONOLACTONE 25 MG TABS: 25 | 30 days supply | Qty: 30 | Fill #0

## 2020-01-08 ENCOUNTER — Telehealth (HOSPITAL_COMMUNITY): Payer: Self-pay

## 2020-01-08 NOTE — Telephone Encounter (Signed)
Returned a message from [name] concerning her medications. She stated upon picking them up from the pharmacy she noticed that one of the pills looked different then before but the bottle said it was the same medicine. I explained that this is typically the result of the pharmacy using a different manufacturer of the drug but assured her the medication was the same. She was understanding and agreeable. She did not drop off paperwork yesterday so I will pick it up this afternoon.   Maria Duncan, EMT 01/08/20

## 2020-01-10 ENCOUNTER — Other Ambulatory Visit (HOSPITAL_COMMUNITY): Payer: Self-pay

## 2020-01-10 NOTE — Progress Notes (Signed)
Paramedicine Encounter    Patient ID: Maria Duncan, female    DOB: 1965/10/03, 54 y.o.   MRN: [id number]   Patient Care Team: Patient, No Pcp Per as PCP - General (General Practice) Nahser, Deloris Ping, MD as PCP - Cardiology (Cardiology)  Patient Active Problem List   Diagnosis Date Noted  . Chest pain of uncertain etiology, non obstructive CAD, pain due to acute HF 12/12/2019  . Pulmonary hypertension, unspecified (HCC) 12/12/2019  . OSA (obstructive sleep apnea) 12/12/2019  . Hypoventilation associated with obesity (HCC) 12/12/2019  . CAD in native artery non obstructive on cath 12/09/19 12/12/2019  . NICM (nonischemic cardiomyopathy) (HCC)   . 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 Morbid obesity (HCC)   . CHF exacerbation (HCC) 12/07/2019  . Cardiomegaly 12/07/2019  . Essential hypertension 12/07/2019  . CHF (congestive heart failure) (HCC) 12/07/2019    Current Outpatient Medications:  .  acetaminophen (TYLENOL) 325 MG tablet, Take 2 tablets (650 mg total) by mouth every 4 (four) hours as needed for headache or mild pain., Disp:  , Rfl:  .  aspirin 81 MG EC tablet, Take 1 tablet (81 mg total) by mouth daily., Disp: 90 tablet, Rfl: 3 .  Blood Glucose Monitoring Suppl (TRUE METRIX METER) w/Device KIT, 1 each by Does not apply route in the morning and at bedtime., Disp: 1 kit, Rfl: 0 .  carvedilol (COREG) 6.25 MG tablet, Take 1 tablet (6.25 mg total) by mouth 2 (two) times daily., Disp: 60 tablet, Rfl: 3 .  cholecalciferol (VITAMIN D3) 25 MCG (1000 UNIT) tablet, Take 1,000 Units by mouth daily., Disp: , Rfl:  .  docusate sodium (COLACE) 100 MG capsule, Take 1 capsule (100 mg total) by mouth 2 (two) times daily., Disp: 10 capsule, Rfl: 0 .  pantoprazole (PROTONIX) 40 MG tablet, Take 1 tablet (40 mg total) by mouth daily., Disp: 30 tablet, Rfl: 1 .  sacubitril-valsartan (ENTRESTO) 24-26 MG, Take 1 tablet by mouth 2 (two) times daily., Disp: 60 tablet, Rfl:  6 .  spironolactone (ALDACTONE) 25 MG tablet, Take 1 tablet (25 mg total) by mouth daily., Disp: 90 tablet, Rfl: 3 .  torsemide (DEMADEX) 20 MG tablet, Take 1 tablet (20 mg total) by mouth 2 (two) times daily., Disp: 60 tablet, Rfl: 3 .  glucose blood (TRUE METRIX BLOOD GLUCOSE TEST) test strip, Use as instructed, Disp: 100 each, Rfl: 12 .  guaiFENesin (ROBITUSSIN) 100 MG/5ML liquid, Take 200 mg by mouth daily as needed for cough., Disp: , Rfl:  .  polyethylene glycol (MIRALAX / GLYCOLAX) 17 g packet, Take 17 g by mouth daily as needed (constipation)., Disp: 14 each, Rfl: 0 .  TRUEplus Lancets 28G MISC, 1 each by Does not apply route in the morning and at bedtime., Disp: 100 each, Rfl: 1 Allergies  Allergen Reactions  . Hyzaar [Losartan Potassium-Hctz] Nausea And Vomiting and Cough      Social History   Socioeconomic History  . Marital status: Married    Spouse name: Not on file  . Number of children: Not on file  . Years of education: Not on file  . Highest education level: Not on file  Occupational History  . Not on file  Tobacco Use  . Smoking status: Never Smoker  . Smokeless tobacco: Never Used  Substance and Sexual Activity  . Alcohol use: No  . Drug use: No  . Sexual activity: Not on file  Other Topics Concern  . Not on  file  Social History Narrative  . Not on file   Social Determinants of Health   Financial Resource Strain:   . Difficulty of Paying Living Expenses:   Food Insecurity:   . Worried About Charity fundraiser in the Last Year:   . Arboriculturist in the Last Year:   Transportation Needs:   . Film/video editor (Medical):   Marland Kitchen Lack of Transportation (Non-Medical):   Physical Activity:   . Days of Exercise per Week:   . Minutes of Exercise per Session:   Stress:   . Feeling of Stress :   Social Connections:   . Frequency of Communication with Friends and Family:   . Frequency of Social Gatherings with Friends and Family:   . Attends Religious  Services:   . Active Member of Clubs or Organizations:   . Attends Archivist Meetings:   Marland Kitchen Marital Status:   Intimate Partner Violence:   . Fear of Current or Ex-Partner:   . Emotionally Abused:   Marland Kitchen Physically Abused:   . Sexually Abused:     Physical Exam Cardiovascular:     Rate and Rhythm: Normal rate and regular rhythm.     Pulses: Normal pulses.  Pulmonary:     Effort: Pulmonary effort is normal.     Breath sounds: Normal breath sounds.  Musculoskeletal:        General: Normal range of motion.     Right lower leg: Edema present.     Left lower leg: Edema present.  Skin:    General: Skin is warm and dry.     Capillary Refill: Capillary refill takes less than 2 seconds.  Neurological:     Mental Status: She is alert and oriented to person, place, and time.  Psychiatric:        Mood and Affect: Mood normal.         Future Appointments  Date Time Provider [place]  01/14/2020 12:00 PM [facility] PHARMACY [facility] None  01/18/2020 12:00 PM [facility] None  01/21/2020  9:40 AM Nahser, Wonda Cheng, MD [facility]  01/21/2020  8:00 PM Sueanne Margarita, MD [facility]  01/29/2020  8:00 AM Ardelle Lesches, RD [facility] NDM    BP 100/74 (BP Location: Left Arm, Patient Position: Sitting, Cuff Size: Large)   Pulse 81   Resp 18   Wt 238 lb 3.2 oz (108 kg)   SpO2 95%   BMI 40.89 kg/m   Weight yesterday- 239 lb Last visit weight- 242 lb  Maria Duncan was seen at home today and reported feeling well. She denied chest pain, SOB, headache, dizziness, orthopnea, fever or cough over the past week. She stated she has been compliant with her medications and her weight has been stable. Her medications were verified and she was able to refill her pillbox while I assisted. Right now her major concern is financial as she has been unable to work due to her heart failure. I spoke with Dr Haroldine Laws who advised she can return to work as soon as she is  comfortable. She was understanding and agreeable. I obtained all remaining documents for [facility] financial assistance and placed them on Maria Duncan Desk with a note reading "Maria Duncan." I will also e-mail her the necessary scans of utility bills and driver's licenses to go with the aforementioned forms.   Maria Duncan, EMT 01/10/20  ACTION: Home visit completed Next visit planned for 1 week

## 2020-01-14 ENCOUNTER — Other Ambulatory Visit (HOSPITAL_COMMUNITY): Payer: Self-pay | Admitting: Internal Medicine

## 2020-01-14 ENCOUNTER — Other Ambulatory Visit: Payer: Self-pay

## 2020-01-14 ENCOUNTER — Ambulatory Visit (HOSPITAL_COMMUNITY)
Admission: RE | Admit: 2020-01-14 | Discharge: 2020-01-14 | Disposition: A | Discharge status: Home / Self Care | Payer: Self-pay | Source: Ambulatory Visit | Attending: Cardiology | Admitting: Cardiology

## 2020-01-14 VITALS — BP 114/80 | HR 70 | Wt 242.8 lb

## 2020-01-14 DIAGNOSIS — Z6841 Body Mass Index (BMI) 40.0 and over, adult: Secondary | ICD-10-CM | POA: Insufficient documentation

## 2020-01-14 DIAGNOSIS — I428 Other cardiomyopathies: Secondary | ICD-10-CM | POA: Insufficient documentation

## 2020-01-14 DIAGNOSIS — E875 Hyperkalemia: Secondary | ICD-10-CM | POA: Insufficient documentation

## 2020-01-14 DIAGNOSIS — E119 Type 2 diabetes mellitus without complications: Secondary | ICD-10-CM | POA: Insufficient documentation

## 2020-01-14 DIAGNOSIS — Z79899 Other long term (current) drug therapy: Secondary | ICD-10-CM | POA: Insufficient documentation

## 2020-01-14 DIAGNOSIS — I110 Hypertensive heart disease with heart failure: Secondary | ICD-10-CM | POA: Insufficient documentation

## 2020-01-14 DIAGNOSIS — E669 Obesity, unspecified: Secondary | ICD-10-CM | POA: Insufficient documentation

## 2020-01-14 DIAGNOSIS — I5022 Chronic systolic (congestive) heart failure: Secondary | ICD-10-CM | POA: Insufficient documentation

## 2020-01-14 DIAGNOSIS — I5041 Acute combined systolic (congestive) and diastolic (congestive) heart failure: Secondary | ICD-10-CM

## 2020-01-14 LAB — BASIC METABOLIC PANEL
Anion gap: 11 (ref 5–15)
BUN: 26 mg/dL — ABNORMAL HIGH (ref 6–20)
CO2: 31 mmol/L (ref 22–32)
Calcium: 9.4 mg/dL (ref 8.9–10.3)
Chloride: 96 mmol/L — ABNORMAL LOW (ref 98–111)
Creatinine, Ser: 1.16 mg/dL — ABNORMAL HIGH (ref 0.44–1.00)
GFR calc Af Amer: >60 mL/min (ref >60)
GFR calc non Af Amer: 54 mL/min — ABNORMAL LOW (ref >60)
Glucose, Bld: 168 mg/dL — ABNORMAL HIGH (ref 70–99)
Potassium: 4.2 mmol/L (ref 3.5–5.1)
Sodium: 138 mmol/L (ref 135–145)

## 2020-01-14 LAB — BRAIN NATRIURETIC PEPTIDE: B Natriuretic Peptide: 60.6 pg/mL (ref 0.0–100.0)

## 2020-01-14 MED ORDER — DAPAGLIFLOZIN PROPANEDIOL 10 MG PO TABS
10.0000 mg | ORAL_TABLET | Freq: Every day | ORAL | 11 refills | Status: DC
Start: 2020-01-14 — End: 2020-01-14

## 2020-01-14 MED FILL — PANTOPRAZOLE SOD DR 40 MG T: 40 | 30 days supply | Qty: 30 | Fill #0

## 2020-01-14 MED FILL — FARXIGA 10 MG TABLET: 10 | 30 days supply | Qty: 30 | Fill #0

## 2020-01-14 NOTE — Progress Notes (Signed)
Primary Care: [facility] Primary HF Cardiologist: Dr Haroldine Laws  HPI: Ms. Hasley is a 54 year old female with a history of HTN, obesity, and recently diagnosed NICM/systolic heart failure.    She was admitted 11/2019 with increased SOB and lower extremity edema. An ECHO was performed and showed a reduced EF of 35-40%. She underwent RHC/LHC that demonstrated minimal CAD, elevated filling pressures, and preserved cardiac output. She was diuresed with IV lasix and transitioned to torsemide 20 mg daily. Additionally, there was concern for sleep apnea.    Recently presented to HF Clinic with NP Amy Clegg on 12/26/2019 for hospital follow-up. Overall she felt fine but was experiencing SOB with exertion and frequent coughing. She was using 3 liters of oxygen nightly. She denied PND/Orthopnea. Patient reported taking all medications as prescribed and was approved for [facility] patient assistance through [employer].   Today she returns to HF clinic with husband for pharmacist medication titration. At last visit with NP, carvedilol 6.25 mg BID was initiated and patient reports tolerating therapy well. Taking all medications as prescribed and tolerating all medications. She is feeling well with no dizziness, lightheadedness, or syncope. No chest pain or palpitations. Breathing has improved with dyspnea only on strenuous exertion. Patient is able to walk more daily. She reports some LEE but denies orthopnea/PND. Weight at home has been stable. She is attempting to reduce sodium intake. Reports not eating pretzels as much, cooking with Mrs. DASH salt, and improving portion control. She is able to perform all ADLs independently.   Marland Kitchen Shortness of breath/dyspnea on exertion? Not at rest or with daily activities. Usually only short of breath if doing higher intensity activities such as walking up and down stairs. . Orthopnea/PND? no . Edema? Yes, but minimal swelling . Lightheadedness/dizziness?  no . Daily weights at home? yes . Blood pressure/heart rate monitoring at home? no . Following low-sodium/fluid-restricted diet? no  HF Medications: - Carvedilol 6.25 mg BID - Entresto 24/26 mg BID - Spironolactone 25 mg daily - Torsemide 20 mg BID  Has the patient been experiencing any side effects to the medications prescribed?  no  Does the patient have any problems obtaining medications due to transportation or finances?   Patient is uninsured but receives medications from HF fund at [facility]. Maryan Puls through [employer] patient assistance.   Understanding of regimen: good Understanding of indications: good Potential of compliance: excellent Patient understands to avoid NSAIDs. Patient understands to avoid decongestants.    Pertinent Lab Values: . Serum creatinine 1.16, BUN 26, Potassium 4.2, Sodium 138, BNP 60.6  Vital Signs: . Weight: 242.8 lbs (last clinic weight: 242 lbs) . Blood pressure: 114/80 . Heart rate: 70  Assessment: 1. Chronic Systolic Heart Failure, NICM   ECHO 11/2019 EF 30-35% RV function moderately reduced.  Cath 11/2019 with minimal cardiac disease and preserved cardiac output. EF on cath 25%.  -NYHA III. Volume status improved after recent torsemide increase. BNP decreased to 60.6 pg/mL today. Only minimal lower extremity swelling. - Continue torsemide 20 mg BID - Continue carvedilol 6.25 mg twice a day.  - Continue Entresto 24-26 mg twice a day. BMET today was stable - Continue Spironolactone 25 mg daily - Add Farxiga 10 mg daily. Obtain repeat BMET at next visit (4 weeks). - Plan to repeat ECHO per MD.  - Discussed low salt food choices.    2. HTN BP at goal in clinic today, 114/80   3. Obesity  Body mass index is 41.68 kg/m. Discussed  dietary changes and increasing exercise as able.   4. Suspected Sleep Apnea  -Needs sleep study but does not have insurance.   5. Hyperkalemia  History of elevated K+.  - K+  WNLS at 4.2 on BMET today    6. DMII -Followed by PCP   Plan: 1) Medication changes: Based on clinical presentation, vital signs and recent labs will Add Farxiga 10 mg daily 2) Labs: BMET today, Scr. 1.16, K+ 4.2, BNP 60.6 pg/mL. Obtain repeat BMET at next visit 3) Follow-up: Future appointment with pharmacy HF clinic in 4 weeks  Alvia Grove, PharmD PGY1 Acute Care Pharmacy Resident  Karle Plumber, PharmD, BCPS, [facility], CPP Heart Failure Clinic Pharmacist [phone]

## 2020-01-14 NOTE — Patient Instructions (Addendum)
It was a pleasure seeing you today!  MEDICATIONS: -We are changing your medications today -Start Farxiga 10 mg (1 tablet) daily -Call if you have questions about your medications.  LABS: -We will call you if your labs need attention.  NEXT APPOINTMENT: Return to clinic in 4 weeks with pharmacy HF clinic  In general, to take care of your heart failure: -Limit your fluid intake to 2 Liters (half-gallon) per day.   -Limit your salt intake to ideally 2-3 grams (2000-3000 mg) per day. -Weigh yourself daily and record, and bring that "weight diary" to your next appointment.  (Weight gain of 2-3 pounds in 1 day typically means fluid weight.) -The medications for your heart are to help your heart and help you live longer.   -Please contact us before stopping any of your heart medications.  Call the clinic at [phone] with questions or to reschedule future appointments.

## 2020-01-16 ENCOUNTER — Telehealth (HOSPITAL_COMMUNITY): Payer: Self-pay

## 2020-01-16 ENCOUNTER — Telehealth (HOSPITAL_COMMUNITY): Payer: Self-pay | Admitting: Licensed Clinical Social Worker

## 2020-01-16 NOTE — Telephone Encounter (Signed)
CSW called pt and pt spouse to check in regarding assistance programs that we had referred them too.  They had turned in all paperwork for [employer] and [employer] so CSW faxed in [employer] and will be mailing out [employer] application for review.  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  They report they never saw email from CSW regarding [place] assistance so CSW resent them the links to apply online.  CSW also encouraged them to reach back out to [employer] to apply for assistance.  They are currently behind on their water bill, electric bill, and gas bills.  They report that electric bill is their biggest concern at this time.  CSW able to pay $315 through the patient care fund so their water bill is now up to date.  Pt and wife will continue to pursue assistance through other options and keep CSW up to date on their progress.  Will continue to follow and assist as needed  Burna Sis, LCSW Clinical Social Worker [facility] Desk#: [phone] Cell#: [phone]

## 2020-01-16 NOTE — Telephone Encounter (Signed)
I called Maria Duncan to schedule an appointment. She stated she would be available in the morning tomorrow so we agreed to meet ay 09:00.  [name], EMT 01/16/20

## 2020-01-17 ENCOUNTER — Other Ambulatory Visit (HOSPITAL_COMMUNITY): Payer: Self-pay

## 2020-01-17 ENCOUNTER — Telehealth (HOSPITAL_COMMUNITY): Payer: Self-pay | Admitting: Licensed Clinical Social Worker

## 2020-01-17 NOTE — Progress Notes (Signed)
Paramedicine Encounter    Patient ID: Maria Duncan, female    DOB: Apr 15, 1966, 54 y.o.   MRN: [id number]   Patient Care Team: Patient, No Pcp Per as PCP - General (General Practice) Duncan, Maria Cheng, MD as PCP - Cardiology (Cardiology)  Patient Active Problem List   Diagnosis Date Noted  . Chest pain of uncertain etiology, non obstructive CAD, pain due to acute HF 12/12/2019  . Pulmonary hypertension, unspecified ([place]) 12/12/2019  . OSA (obstructive sleep apnea) 12/12/2019  . Hypoventilation associated with obesity ([place]) 12/12/2019  . CAD in native artery non obstructive on cath 12/09/19 12/12/2019  . NICM (nonischemic cardiomyopathy) ([place])   . Acute combined systolic and diastolic HF (heart failure) ([place])   . Morbid obesity ([place])   . CHF exacerbation ([place]) 12/07/2019  . Cardiomegaly 12/07/2019  . Essential hypertension 12/07/2019  . CHF (congestive heart failure) ([place]) 12/07/2019    Current Outpatient Medications:  .  acetaminophen (TYLENOL) 325 MG tablet, Take 2 tablets (650 mg total) by mouth every 4 (four) hours as needed for headache or mild pain., Disp:  , Rfl:  .  aspirin 81 MG EC tablet, Take 1 tablet (81 mg total) by mouth daily., Disp: 90 tablet, Rfl: 3 .  carvedilol (COREG) 6.25 MG tablet, Take 1 tablet (6.25 mg total) by mouth 2 (two) times daily., Disp: 60 tablet, Rfl: 3 .  cholecalciferol (VITAMIN D3) 25 MCG (1000 UNIT) tablet, Take 1,000 Units by mouth daily., Disp: , Rfl:  .  dapagliflozin propanediol (FARXIGA) 10 MG TABS tablet, Take 1 tablet (10 mg total) by mouth daily before breakfast., Disp: 30 tablet, Rfl: 11 .  docusate sodium (COLACE) 100 MG capsule, Take 1 capsule (100 mg total) by mouth 2 (two) times daily., Disp: 10 capsule, Rfl: 0 .  pantoprazole (PROTONIX) 40 MG tablet, Take 1 tablet (40 mg total) by mouth daily., Disp: 30 tablet, Rfl: 1 .  sacubitril-valsartan (ENTRESTO) 24-26 MG, Take 1 tablet by mouth 2 (two) times daily., Disp: 60 tablet, Rfl: 6 .   spironolactone (ALDACTONE) 25 MG tablet, Take 1 tablet (25 mg total) by mouth daily., Disp: 90 tablet, Rfl: 3 .  torsemide (DEMADEX) 20 MG tablet, Take 1 tablet (20 mg total) by mouth 2 (two) times daily., Disp: 60 tablet, Rfl: 3 .  Blood Glucose Monitoring Suppl (TRUE METRIX METER) w/Device KIT, 1 each by Does not apply route in the morning and at bedtime., Disp: 1 kit, Rfl: 0 .  glucose blood (TRUE METRIX BLOOD GLUCOSE TEST) test strip, Use as instructed, Disp: 100 each, Rfl: 12 .  polyethylene glycol (MIRALAX / GLYCOLAX) 17 g packet, Take 17 g by mouth daily as needed (constipation)., Disp: 14 each, Rfl: 0 .  TRUEplus Lancets 28G MISC, 1 each by Does not apply route in the morning and at bedtime., Disp: 100 each, Rfl: 1 Allergies  Allergen Reactions  . Hyzaar [Losartan Potassium-Hctz] Nausea And Vomiting and Cough      Social History   Socioeconomic History  . Marital status: Married    Spouse name: Not on file  . Number of children: Not on file  . Years of education: Not on file  . Highest education level: Not on file  Occupational History  . Not on file  Tobacco Use  . Smoking status: Never Smoker  . Smokeless tobacco: Never Used  Substance and Sexual Activity  . Alcohol use: No  . Drug use: No  . Sexual activity: Not on file  Other Topics Concern  .  Not on file  Social History Narrative  . Not on file   Social Determinants of Health   Financial Resource Strain:   . Difficulty of Paying Living Expenses:   Food Insecurity:   . Worried About Charity fundraiser in the Last Year:   . Arboriculturist in the Last Year:   Transportation Needs:   . Film/video editor (Medical):   Marland Kitchen Lack of Transportation (Non-Medical):   Physical Activity:   . Days of Exercise per Week:   . Minutes of Exercise per Session:   Stress:   . Feeling of Stress :   Social Connections:   . Frequency of Communication with Friends and Family:   . Frequency of Social Gatherings with Friends  and Family:   . Attends Religious Services:   . Active Member of Clubs or Organizations:   . Attends Archivist Meetings:   Marland Kitchen Marital Status:   Intimate Partner Violence:   . Fear of Current or Ex-Partner:   . Emotionally Abused:   Marland Kitchen Physically Abused:   . Sexually Abused:     Physical Exam Cardiovascular:     Rate and Rhythm: Normal rate and regular rhythm.     Pulses: Normal pulses.  Pulmonary:     Effort: Pulmonary effort is normal.     Breath sounds: Normal breath sounds.  Musculoskeletal:        General: Normal range of motion.     Right lower leg: Edema present.     Left lower leg: Edema present.  Skin:    General: Skin is warm and dry.     Capillary Refill: Capillary refill takes less than 2 seconds.  Neurological:     Mental Status: She is alert and oriented to person, place, and time.  Psychiatric:        Mood and Affect: Mood normal.         Future Appointments  Date Time Provider [place]  01/18/2020 12:00 PM [facility]EN[facility] None  01/20/2020  8:00 PM Sueanne Margarita, MD [facility]  01/21/2020  9:40 AM Duncan, Maria Cheng, MD [facility]  01/29/2020  8:00 AM Ardelle Lesches, RD [place] NDM  02/13/2020 10:00 AM [facility] PHARMACY [facility] None    BP 114/76 (BP Location: Left Arm, Patient Position: Sitting, Cuff Size: Large)   Pulse 75   Resp 16   Wt 239 lb 4.8 oz (108.5 kg)   SpO2 99%   BMI 41.08 kg/m   Weight yesterday- 238.2 lb Last visit weight- 242 lb  Maria Duncan was seen at home today and reported feeling well. She denied chest pain, SOB, headache, orthopnea, dizziness, cough of fever over the past week. She reported being compliant with her medications over the past week and her weight has been stable. She stated she has been exercising but gets slight cramping in the hip flexors while walking but it resolves with short periods of rest. Potassium looked good at last check and magnesium was WNL during her admission.  He medications were verified and she was assisted in refilling her pillbox. I will follow up next week.   Maria Duncan, EMT 01/17/20  ACTION: Home visit completed Next visit planned for 1 week

## 2020-01-17 NOTE — Telephone Encounter (Signed)
Community paramedic brought CSW back additional paperwork to complete [employer] application- CSW mailed packet to billing department to review for assistance  Will continue to follow and assist as needed  Burna Sis, LCSW Clinical Social Worker [facility] Desk#: [phone] Cell#: [phone]

## 2020-01-18 ENCOUNTER — Other Ambulatory Visit (HOSPITAL_COMMUNITY)
Admission: RE | Admit: 2020-01-18 | Discharge: 2020-01-18 | Disposition: A | Discharge status: Home / Self Care | Payer: HRSA Program | Source: Ambulatory Visit | Attending: Cardiology | Admitting: Cardiology

## 2020-01-18 DIAGNOSIS — Z20822 Contact with and (suspected) exposure to covid-19: Secondary | ICD-10-CM | POA: Insufficient documentation

## 2020-01-18 DIAGNOSIS — Z01812 Encounter for preprocedural laboratory examination: Secondary | ICD-10-CM | POA: Diagnosis present

## 2020-01-18 LAB — SARS CORONAVIRUS 2 (TAT 6-24 HRS): SARS Coronavirus 2: NEGATIVE

## 2020-01-20 ENCOUNTER — Ambulatory Visit (HOSPITAL_BASED_OUTPATIENT_CLINIC_OR_DEPARTMENT_OTHER): Payer: Self-pay | Attending: Cardiology | Admitting: Cardiology

## 2020-01-20 ENCOUNTER — Other Ambulatory Visit: Payer: Self-pay

## 2020-01-20 DIAGNOSIS — I110 Hypertensive heart disease with heart failure: Secondary | ICD-10-CM | POA: Insufficient documentation

## 2020-01-20 DIAGNOSIS — E669 Obesity, unspecified: Secondary | ICD-10-CM | POA: Insufficient documentation

## 2020-01-20 DIAGNOSIS — R0902 Hypoxemia: Secondary | ICD-10-CM | POA: Insufficient documentation

## 2020-01-20 DIAGNOSIS — I493 Ventricular premature depolarization: Secondary | ICD-10-CM | POA: Insufficient documentation

## 2020-01-20 DIAGNOSIS — G4733 Obstructive sleep apnea (adult) (pediatric): Secondary | ICD-10-CM

## 2020-01-20 DIAGNOSIS — I509 Heart failure, unspecified: Secondary | ICD-10-CM | POA: Insufficient documentation

## 2020-01-21 ENCOUNTER — Ambulatory Visit: Payer: Self-pay | Admitting: Cardiovascular Disease

## 2020-01-21 ENCOUNTER — Encounter (HOSPITAL_BASED_OUTPATIENT_CLINIC_OR_DEPARTMENT_OTHER): Payer: Self-pay | Admitting: Cardiology

## 2020-01-21 NOTE — Procedures (Signed)
Patient Name: Maria Duncan, Maria Duncan Date: 01/20/2020 Gender: Female D.O.B: 06-01-1966 Age (years): 53 Referring Provider: Cecilie Kicks Height (inches): 63 Interpreting Physician: Fransico Him MD, ABSM Weight (lbs): 239 RPSGT: Laren Everts BMI: 39 MRN: [id number] Neck Size: 16.00  CLINICAL INFORMATION Sleep Study Type: Split Night CPAP  Indication for sleep study: Congestive Heart Failure, Excessive Daytime Sleepiness, Hypertension, Morning Headaches, Obesity, Sleep walking/talking/parasomnias, Snoring, Witnessed Apneas  Epworth Sleepiness Score: 9  SLEEP STUDY TECHNIQUE As per the AASM Manual for the Scoring of Sleep and Associated Events v2.3 (April 2016) with a hypopnea requiring 4% desaturations.  The channels recorded and monitored were frontal, central and occipital EEG, electrooculogram (EOG), submentalis EMG (chin), nasal and oral airflow, thoracic and abdominal wall motion, anterior tibialis EMG, snore microphone, electrocardiogram, and pulse oximetry. Continuous positive airway pressure (CPAP) was initiated when the patient met split night criteria and was titrated according to treat sleep-disordered breathing.  MEDICATIONS Medications self-administered by patient taken the night of the study : N/A  RESPIRATORY PARAMETERS Diagnostic Total AHI (/hr): 99.1  RDI (/hr):100.1  OA Index (/hr): 75.2 CA Index (/hr): 1.0 REM AHI (/hr): 60.0  NREM AHI (/hr):100.7  Supine AHI (/hr):106.0  Non-supine AHI (/hr): 94.8 Min O2 Sat (%):51.0  Mean O2 (%): 84.6  Time below 88% (min):91.3   Titration Optimal Pressure (cm):17  AHI at Optimal Pressure (/hr):4.2  Min O2 at Optimal Pressure (%):92.0 Supine % at Optimal (%):0  Sleep % at Optimal (%):100   SLEEP ARCHITECTURE The recording time for the entire night was 413.7 minutes.  During a baseline period of 170.8 minutes, the patient slept for 125.3 minutes in REM and nonREM, yielding a sleep efficiency of 73.4%. Sleep  onset after lights out was 20.5 minutes with a REM latency of 105.5 minutes. The patient spent 10.0% of the night in stage N1 sleep, 86.0% in stage N2 sleep, 0.0% in stage N3 and 4% in REM.  During the titration period of 234.1 minutes, the patient slept for 216.5 minutes in REM and nonREM, yielding a sleep efficiency of 92.5%. Sleep onset after CPAP initiation was 3.4 minutes with a REM latency of 9.0 minutes. The patient spent 4.4% of the night in stage N1 sleep, 33.7% in stage N2 sleep, 0.0% in stage N3 and 61.9% in REM.  CARDIAC DATA The 2 lead EKG demonstrated sinus rhythm. The mean heart rate was 100.0 beats per minute. Other EKG findings include: PVCs and PACs.  LEG MOVEMENT DATA The total Periodic Limb Movements of Sleep (PLMS) were 0. The PLMS index was 0.0 .  IMPRESSIONS - Severe obstructive sleep apnea occurred during the diagnostic portion of the study (AHI = 99.1/hour). An optimal PAP pressure was selected for this patient ( 17 cm of water) - No significant central sleep apnea occurred during the diagnostic portion of the study (CAI = 1.0/hour). - Severe oxygen desaturation was noted during the diagnostic portion of the study (Min O2 = 51.0%). - The patient snored with moderate snoring volume during the diagnostic portion of the study. - EKG findings include PVCs and PACs. - Clinically significant periodic limb movements did not occur during sleep.  DIAGNOSIS - Obstructive Sleep Apnea (327.23 [G47.33 ICD-10]) - Nocturnal Hypoxemia  RECOMMENDATIONS - Trial of CPAP therapy on 17 cm H2O with a Small size Fisher&Paykel Full Face Mask F&P Vitera (new) mask and heated humidification. - Avoid alcohol, sedatives and other CNS depressants that may worsen sleep apnea and disrupt normal sleep architecture. - Sleep hygiene should be  reviewed to assess factors that may improve sleep quality. - Weight management and regular exercise should be initiated or continued. - Return to Sleep Center  for re-evaluation after 8 weeks of therapy  [Electronically signed] 01/21/2020 10:00 PM  Fransico Him MD, ABSM Diplomate, American Board of Sleep Medicine

## 2020-01-23 ENCOUNTER — Telehealth (HOSPITAL_COMMUNITY): Payer: Self-pay

## 2020-01-23 ENCOUNTER — Telehealth: Payer: Self-pay | Admitting: *Deleted

## 2020-01-23 NOTE — Telephone Encounter (Signed)
Informed patient of sleep study results and patient understanding was verbalized. Patient understands she sleep study showed they have significant sleep apnea and had successful PAP titration and will be set up with PAP unit. Please let DME know that order is in EPIC. Please set patient up for OV in 8 weeks     Upon patient request DME selection is [facility]. Patient understands she will be contacted by [facility] to set up her cpap. Patient understands to call if [facility] does not contact her with new setup in a timely manner. Patient understands they will be called once confirmation has been received from [facility] that they have received their new machine to schedule 10 week follow up appointment.  [facility] notified of new cpap order  Please add to airview Patient was grateful for the call and thanked me.

## 2020-01-23 NOTE — Telephone Encounter (Signed)
-----   Message from Quintella Reichert, MD sent at 01/21/2020 10:03 PM EDT ----- Please let patient know that they have significant sleep apnea and had successful PAP titration and will be set up with PAP unit.  Please let DME know that order is in EPIC.  Please set patient up for OV in 8 weeks

## 2020-01-23 NOTE — Telephone Encounter (Signed)
I called Ms Grzywacz to schedule an appointment. Her phone went directly to voicemail so I left a message requesting she call me back. I also called Mr Pacifico but his phone also went to voicemail without ringing. If I do not hear back from them I will stop by tomorrow.   [name], EMT 01/23/20

## 2020-01-24 ENCOUNTER — Ambulatory Visit (INDEPENDENT_AMBULATORY_CARE_PROVIDER_SITE_OTHER): Payer: Self-pay | Admitting: Cardiovascular Disease

## 2020-01-24 ENCOUNTER — Other Ambulatory Visit: Payer: Self-pay

## 2020-01-24 ENCOUNTER — Encounter: Payer: Self-pay | Admitting: Cardiovascular Disease

## 2020-01-24 ENCOUNTER — Other Ambulatory Visit (HOSPITAL_COMMUNITY): Payer: Self-pay

## 2020-01-24 VITALS — BP 114/76 | HR 58 | Ht 64.0 in | Wt 244.0 lb

## 2020-01-24 DIAGNOSIS — I5043 Acute on chronic combined systolic (congestive) and diastolic (congestive) heart failure: Secondary | ICD-10-CM | POA: Insufficient documentation

## 2020-01-24 DIAGNOSIS — I10 Essential (primary) hypertension: Secondary | ICD-10-CM

## 2020-01-24 MED FILL — CARVEDILOL 6.25 MG TABLET: 6.25 | 30 days supply | Qty: 60 | Fill #1

## 2020-01-24 NOTE — Progress Notes (Signed)
Ms Darnold was seen at home today and rpeorted feeling well. She denied SOB, headache, dizziness, orthopnea, fever or cough over the past week. She stated last night she had an quick, sharp pain in her chest but it resolved immediately. She has an appointment with cardiology today so I advised her to speak to the cardiologist about this. Her medications were verified and she refilled her pillbox with minimal assistance. I will follow up next week.    [name], EMT 01/24/20

## 2020-01-24 NOTE — Progress Notes (Signed)
Cardiology Office Note:    Date:  01/24/2020   ID:  Maria Duncan, DOB [birth year], MRN [id number]  PCP:  System, Pcp Not In  [facility] Cardiologist:  Mertie Moores, MD  [place] Electrophysiologist:  None   Referring MD: No ref. provider found   Chief Complaint  Patient presents with   Congestive Heart Failure     History of Present Illness:    Maria Duncan is a 53 y.o. female with a hx of congestive heart failure.  I met her during hospitalization in May, 2021. She had heart catheterization with Dr. Nani Ravens.  She was found to have minor coronary artery irregularities.  She had a severe LV dysfunction with an EF of 25%.  Echo reveals an EF of around 35 to 40%.  The patient has uncontrolled sleep apnea.  Her PCO2 is around 80-90. Has had her sleep study.   CPAP is being arranged.  Has seen Cecilie Kicks, NP  Breathing is better.  Is watching her salt .   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) (HCC)    Chest pain of uncertain etiology, non obstructive CAD, pain due to acute HF 12/12/2019   Hypertension    Hypoventilation associated with obesity ([place]) 12/12/2019   Morbid obesity ([place])    NICM (nonischemic cardiomyopathy) ([place])    OSA (obstructive sleep apnea) 12/12/2019   Pulmonary hypertension, unspecified ([place]) 12/12/2019    Past Surgical History:  Procedure Laterality Date   ABDOMINAL HYSTERECTOMY     RIGHT/LEFT HEART CATH AND CORONARY ANGIOGRAPHY N/A 12/09/2019   Procedure: RIGHT/LEFT HEART CATH AND CORONARY ANGIOGRAPHY;  Surgeon: Jolaine Artist, MD;  Location: [facility];  Service: Cardiovascular;  Laterality: N/A;    Current Medications: Current Meds  Medication Sig   acetaminophen (TYLENOL) 325 MG tablet Take 2 tablets (650 mg total) by mouth every 4 (four) hours as needed for headache or mild pain.   aspirin 81 MG EC tablet Take 1 tablet (81 mg total) by mouth daily.    carvedilol (COREG) 6.25 MG tablet Take 1 tablet (6.25 mg total) by mouth 2 (two) times daily.   cholecalciferol (VITAMIN D3) 25 MCG (1000 UNIT) tablet Take 1,000 Units by mouth daily.   dapagliflozin propanediol (FARXIGA) 10 MG TABS tablet Take 1 tablet (10 mg total) by mouth daily before breakfast.   docusate sodium (COLACE) 100 MG capsule Take 1 capsule (100 mg total) by mouth 2 (two) times daily.   pantoprazole (PROTONIX) 40 MG tablet Take 1 tablet (40 mg total) by mouth daily.   polyethylene glycol (MIRALAX / GLYCOLAX) 17 g packet Take 17 g by mouth daily as needed (constipation).   sacubitril-valsartan (ENTRESTO) 24-26 MG Take 1 tablet by mouth 2 (two) times daily.   spironolactone (ALDACTONE) 25 MG tablet Take 1 tablet (25 mg total) by mouth daily.   torsemide (DEMADEX) 20 MG tablet Take 1 tablet (20 mg total) by mouth 2 (two) times daily.   TRUEplus Lancets 28G MISC 1 each by Does not apply route in the morning and at bedtime.     Allergies:   Hyzaar [losartan potassium-hctz]   Social History   Socioeconomic History   Marital status: Married    Spouse name: Not on file   Number of children: Not on file   Years of education: Not on file   Highest education level: Not on file  Occupational History   Not on file  Tobacco  Use   Smoking status: Never Smoker   Smokeless tobacco: Never Used  Substance and Sexual Activity   Alcohol use: No   Drug use: No   Sexual activity: Not on file  Other Topics Concern   Not on file  Social History Narrative   Not on file   Social Determinants of Health   Financial Resource Strain:    Difficulty of Paying Living Expenses:   Food Insecurity:    Worried About Charity fundraiser in the Last Year:    Arboriculturist in the Last Year:   Transportation Needs:    Film/video editor (Medical):    Lack of Transportation (Non-Medical):   Physical Activity:    Days of Exercise per Week:    Minutes of Exercise  per Session:   Stress:    Feeling of Stress :   Social Connections:    Frequency of Communication with Friends and Family:    Frequency of Social Gatherings with Friends and Family:    Attends Religious Services:    Active Member of Clubs or Organizations:    Attends Music therapist:    Marital Status:      Family History: The patient's family history includes Cancer in her maternal grandfather, maternal grandmother, mother, and paternal grandfather; Diabetes in her father, maternal grandfather, and paternal grandmother; Heart disease in her maternal grandfather; Hyperlipidemia in her brother, mother, and sister; Hypertension in her brother, father, maternal grandfather, paternal grandfather, paternal grandmother, and sister; Stroke in her maternal grandmother and paternal grandfather.  ROS:   Please see the history of present illness.     All other systems reviewed and are negative.  EKGs/Labs/Other Studies Reviewed:    The following studies were reviewed today:   EKG:     Recent Labs: 12/08/2019: ALT 74; Magnesium 1.7; Platelets 225; TSH 2.987 12/09/2019: Hemoglobin 15.3 12/19/2019: NT-Pro BNP 957 01/14/2020: B Natriuretic Peptide 60.6; BUN 26; Creatinine, Ser 1.16; Potassium 4.2; Sodium 138  Recent Lipid Panel    Component Value Date/Time   CHOL 204 (H) 12/08/2019 0435   TRIG 82 12/08/2019 0435   HDL 65 12/08/2019 0435   CHOLHDL 3.1 12/08/2019 0435   VLDL 16 12/08/2019 0435   LDLCALC 123 (H) 12/08/2019 0435    Physical Exam:    VS:  BP 114/76    Pulse (!) 58    Ht '5\' 4"'$  (1.626 m)    Wt 244 lb (110.7 kg)    SpO2 96%    BMI 41.88 kg/m     Wt Readings from Last 3 Encounters:  01/24/20 244 lb (110.7 kg)  01/20/20 238 lb 12.8 oz (108.3 kg)  01/17/20 239 lb 4.8 oz (108.5 kg)     GEN:  Well nourished, well developed in no acute distress HEENT: Normal NECK: No JVD; No carotid bruits LYMPHATICS: No lymphadenopathy CARDIAC: RRR, no murmurs, rubs,  gallops RESPIRATORY:  Clear to auscultation without rales, wheezing or rhonchi  ABDOMEN: Soft, non-tender, non-distended MUSCULOSKELETAL:  Trace edema ; No deformity  SKIN: Warm and dry NEUROLOGIC:  Alert and oriented x 3 PSYCHIATRIC:  Normal affect   ASSESSMENT:    No diagnosis found. PLAN:    In order of problems listed above:  1.  acute on chronic combined CHF :   EF is moderately depressed.  She is on medical therapy and is doing quite well.  Her last BNP was 60.  Continue current medications.  We will have her see an APP  in 3 months.  I will plan on seeing her in 6 months. Cont meds.  We may be able to reduce his torsemide at some point in the near future.   2.   OSA:   Has had his sleep study .  Needs to be placed on CPAP   3.  Glucose intolerence:   Is supposed to be a glucometer .  Meeting with an nutritionist next week .    Medication Adjustments/Labs and Tests Ordered: Current medicines are reviewed at length with the patient today.  Concerns regarding medicines are outlined above.  No orders of the defined types were placed in this encounter.  No orders of the defined types were placed in this encounter.   Patient Instructions  Medication Instructions:  Your physician recommends that you continue on your current medications as directed. Please refer to the Current Medication list given to you today.  *If you need a refill on your cardiac medications before your next appointment, please call your pharmacy*   Lab Work: None Ordered If you have labs (blood work) drawn today and your tests are completely normal, you will receive your results only by:  [place] (if you have MyChart) OR  A paper copy in the mail If you have any lab test that is abnormal or we need to change your treatment, we will call you to review the results.   Testing/Procedures: None Ordered   Follow-Up: At [facility], you and your health needs are our priority.  As part of  our continuing mission to provide you with exceptional heart care, we have created designated Provider Care Teams.  These Care Teams include your primary Cardiologist (physician) and Advanced Practice Providers (APPs -  Physician Assistants and Nurse Practitioners) who all work together to provide you with the care you need, when you need it.     Your next appointment:   3 month(s)  The format for your next appointment:   In Person  Provider:   Richardson Dopp, PA [place], [place] Cecilie Kicks, NP       Signed, Mertie Moores, MD  01/24/2020 12:37 PM    [place]

## 2020-01-24 NOTE — Patient Instructions (Signed)
Medication Instructions:  Your physician recommends that you continue on your current medications as directed. Please refer to the Current Medication list given to you today.  *If you need a refill on your cardiac medications before your next appointment, please call your pharmacy*   Lab Work: None Ordered If you have labs (blood work) drawn today and your tests are completely normal, you will receive your results only by: Marland Kitchen MyChart Message (if you have MyChart) OR . A paper copy in the mail If you have any lab test that is abnormal or we need to change your treatment, we will call you to review the results.   Testing/Procedures: None Ordered   Follow-Up: At [facility], you and your health needs are our priority.  As part of our continuing mission to provide you with exceptional heart care, we have created designated Provider Care Teams.  These Care Teams include your primary Cardiologist (physician) and Advanced Practice Providers (APPs -  Physician Assistants and Nurse Practitioners) who all work together to provide you with the care you need, when you need it.     Your next appointment:   3 month(s)  The format for your next appointment:   In Person  Provider:   Tereso Newcomer, PA [place], [place] Nada Boozer, NP

## 2020-01-24 NOTE — Telephone Encounter (Signed)
Patient called back and had some follow-up questions about ordering her CPAP machine. Please call the patient

## 2020-01-28 ENCOUNTER — Telehealth (HOSPITAL_COMMUNITY): Payer: Self-pay | Admitting: Licensed Clinical Social Worker

## 2020-01-28 MED FILL — !TRUE METRIX BLOOD GLUCOSE: 1 days supply | Qty: 1 | Fill #0

## 2020-01-28 MED FILL — TRUEplus LANCETS 28G MISC: 50 days supply | Qty: 100 | Fill #0

## 2020-01-28 MED FILL — TRUE METRIX TEST STRIP: 25 days supply | Qty: 100 | Fill #0

## 2020-01-28 NOTE — Telephone Encounter (Addendum)
CSW received call from pt this morning requesting update regarding [employer] status.  States that she is trying to get a CPAP for her sleep apnea and they were asking her about the [employer]- CSW did not think that they would assist with a CPAP but per patient they were inquiring about [employer] Card status in order to help with getting her CPAP.  CSW called [facility] to inquire about pt application status- they confirm they have the application but that it can take 4 weeks to complete review- they will reach out to pt directly with their determination.  CSW also inquired with pt if she had been able to pick up her glucose meter- pt reports it was too expensive but had not checked with [employer] [facility].  CSW called [facility] who confirmed they have script for it and would only be $12- CSW able to pick up meter for pt and will have Community Paramedic take it out to her for next visit.  CSW will continue to follow and assist as needed  Burna Sis, LCSW Clinical Social Worker [facility] Desk#: [phone] Cell#: [phone]

## 2020-01-28 NOTE — Telephone Encounter (Signed)
Returned Call: Reached out to the patient to explain that every thing has to go through insurance to be approved. Patient informed me that she did not have insurance but that the social worker(Jenna) at [facility] got her approved through [facility]) organization. I have reached out to [facility] to see if they take the charity insurance.

## 2020-01-29 ENCOUNTER — Other Ambulatory Visit: Payer: Self-pay

## 2020-01-29 ENCOUNTER — Encounter: Payer: Self-pay | Admitting: Registered"

## 2020-01-29 ENCOUNTER — Encounter: Payer: Self-pay | Attending: Cardiology | Admitting: Registered"

## 2020-01-29 DIAGNOSIS — Z713 Dietary counseling and surveillance: Secondary | ICD-10-CM | POA: Insufficient documentation

## 2020-01-29 NOTE — Progress Notes (Signed)
Medical Nutrition Therapy:  Appt start time: 8:15 end time:  9:22.   Assessment:  Primary concerns today: Per recent labs, elevated Chol (204), elevated A1c (7.1), .   Pt expectations: what to eat, how to read labels  Pt arrives with husband. States she has sleep apnea and waiting on CPAP machine. States she has not been sleeping well. Reports CHF diagnosis, elevated A1c, sleep apnea, and medications are all new to her. States she is on oxygen during the day sometimes when she's doing a lot.   Pt states she cooks her meals. Eats fresh fruit. Does not eat processed foods. 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 States previous to diagnosis, she had no control over pretzels and potato chips. States she knows portion control when serving food to customers but eats what she wants and how much she wants. States she is not a "sugar eater". Also reports prior to diagnosis she would normally would eat bacon, eggs, etc for breakfast. Now she is eating more fruit and salads. States she doesn't drink juice because it takes blood sugar numbers up and brings it back down and she can feel the changes in her body, although not checking blood sugar numbers yet. Pt states she is waiting to receive her glucometer to start checking blood sugar numbers.   States she is walking more as physical activity. States she has borderline diabetes. States she is on water restriction of 2L/day.     Preferred Learning Style:   No preference indicated   Learning Readiness:   Ready  Change in progress   MEDICATIONS: See list   DIETARY INTAKE:  Usual eating pattern includes 3 meals and 2-3 snacks per day.  Everyday foods include fruit, sandwich, and pasta.  Avoided foods include high fructose corn syrup, hot dogs, bacon, processed foods, sodas, drinks.    24-hr recall:  B ( AM): watermelon, grapes, cherries, cantaloupe Snk ( AM): fruit  L ( PM): [place]- 1/4 [place] and cheese sandwich Snk  ( PM): fruit D ( PM): spaghetti or baked ziti Snk ( PM): fruit Beverages: water (unsure of amount), lemonade (8 oz)  Usual physical activity: leisurely walking 20 min, 5 days/week. Twists, arm twirls, leg lifts during the walk.   Estimated energy needs: 1600 calories 180 g carbohydrates 120 g protein 44 g fat  Progress Towards Goal(s):  In progress.   Nutritional Diagnosis:  NB-1.1 Food and nutrition-related knowledge deficit As related to congestive heart failure.  As evidenced by pt verbalizes incomplete knowledge.    Intervention: Nutrition education and counseling. Pt was educated and counseled on recent labs values and how they correlate to nutrition, role and importance of increasing fruit and vegetable intake, how to choose lower sodium items, how to adhere to fluid restriction, balanced meals/snacks, and the importance of physical activity. Pt was in agreement with goals listed.  Goals: - Aim to balance fruit with nuts as snack options.  - Aim to balance meals with protein, starch/grain, vegetables, fruit, and dairy.  - Continue to choose low-sodium, salt-free items aiming for less than 2000 mg sodium/day.  - Use 2L container as guide for drinking water during the day to stay within fluid restrictions.  - Keep up the great work being active most days of the week!  Teaching Method Utilized:  Visual Auditory Hands on  Handouts given during visit include:  Congestive Heart Failure Nutrition Therapy  Barriers to learning/adherence to lifestyle change: none identified  Demonstrated degree of  understanding via:  Teach Back   Monitoring/Evaluation:  Dietary intake, exercise, and body weight prn.

## 2020-01-29 NOTE — Patient Instructions (Signed)
-   Aim to balance fruit with nuts as snack options.   - Aim to balance meals with protein, starch/grain, vegetables, fruit, and dairy.   - Continue to choose low-sodium, salt-free items aiming for less than 2000 mg sodium/day.   - Use 2L container as guide for drinking water during the day to stay within fluid restrictions.   - Keep up the great work being active most days of the week!

## 2020-01-30 ENCOUNTER — Other Ambulatory Visit (HOSPITAL_COMMUNITY): Payer: Self-pay

## 2020-01-30 NOTE — Telephone Encounter (Signed)
Patient called to say she is waiting on her yellow card and Ann at [facility] is helping her to get her cpap unit with the charity insurance.

## 2020-01-30 NOTE — Progress Notes (Signed)
Maria Duncan was seen at home today to deliver a glucometer which the HF clinic had purchased for her. She accepted the bag however called me immediately upon my leaving because she did not know how to use it. I explained that she should read the instructions provided but I would be back tomorrow if she has additional questions. She was understanding and agreeable.   [name], EMT 01/30/20

## 2020-01-31 ENCOUNTER — Other Ambulatory Visit (HOSPITAL_COMMUNITY): Payer: Self-pay

## 2020-01-31 NOTE — Progress Notes (Signed)
Paramedicine Encounter    Patient ID: Maria Duncan, female    DOB: [birth year], 54 y.o.   MRN: [id number]   Patient Care Team: System, Pcp Not In as PCP - General Nahser, Deloris Ping, MD as PCP - Cardiology (Cardiology)  Patient Active Problem List   Diagnosis Date Noted  . Acute on chronic combined systolic and diastolic CHF (congestive heart failure) (HCC) 01/24/2020  . Chest pain of uncertain etiology, non obstructive CAD, pain due to acute HF 12/12/2019  . Pulmonary hypertension, unspecified (HCC) 12/12/2019  . OSA (obstructive sleep apnea) 12/12/2019  . Hypoventilation associated with obesity (HCC) 12/12/2019  . CAD in native artery non obstructive on cath 12/09/19 12/12/2019  . NICM (nonischemic cardiomyopathy) (HCC)   . 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 Morbid obesity (HCC)   . CHF exacerbation (HCC) 12/07/2019  . Cardiomegaly 12/07/2019  . Essential hypertension 12/07/2019  . CHF (congestive heart failure) (HCC) 12/07/2019    Current Outpatient Medications:  .  acetaminophen (TYLENOL) 325 MG tablet, Take 2 tablets (650 mg total) by mouth every 4 (four) hours as needed for headache or mild pain., Disp:  , Rfl:  .  aspirin 81 MG EC tablet, Take 1 tablet (81 mg total) by mouth daily., Disp: 90 tablet, Rfl: 3 .  carvedilol (COREG) 6.25 MG tablet, Take 1 tablet (6.25 mg total) by mouth 2 (two) times daily., Disp: 60 tablet, Rfl: 3 .  cholecalciferol (VITAMIN D3) 25 MCG (1000 UNIT) tablet, Take 1,000 Units by mouth daily., Disp: , Rfl:  .  dapagliflozin propanediol (FARXIGA) 10 MG TABS tablet, Take 1 tablet (10 mg total) by mouth daily before breakfast., Disp: 30 tablet, Rfl: 11 .  docusate sodium (COLACE) 100 MG capsule, Take 1 capsule (100 mg total) by mouth 2 (two) times daily., Disp: 10 capsule, Rfl: 0 .  pantoprazole (PROTONIX) 40 MG tablet, Take 1 tablet (40 mg total) by mouth daily., Disp: 30 tablet, Rfl: 1 .  polyethylene glycol (MIRALAX /  GLYCOLAX) 17 g packet, Take 17 g by mouth daily as needed (constipation)., Disp: 14 each, Rfl: 0 .  sacubitril-valsartan (ENTRESTO) 24-26 MG, Take 1 tablet by mouth 2 (two) times daily., Disp: 60 tablet, Rfl: 6 .  spironolactone (ALDACTONE) 25 MG tablet, Take 1 tablet (25 mg total) by mouth daily., Disp: 90 tablet, Rfl: 3 .  torsemide (DEMADEX) 20 MG tablet, Take 1 tablet (20 mg total) by mouth 2 (two) times daily., Disp: 60 tablet, Rfl: 3 .  TRUEplus Lancets 28G MISC, 1 each by Does not apply route in the morning and at bedtime., Disp: 100 each, Rfl: 1 Allergies  Allergen Reactions  . Hyzaar [Losartan Potassium-Hctz] Nausea And Vomiting and Cough      Social History   Socioeconomic History  . Marital status: Married    Spouse name: Not on file  . Number of children: Not on file  . Years of education: Not on file  . Highest education level: Not on file  Occupational History  . Not on file  Tobacco Use  . Smoking status: Never Smoker  . Smokeless tobacco: Never Used  Substance and Sexual Activity  . Alcohol use: No  . Drug use: No  . Sexual activity: Not on file  Other Topics Concern  . Not on file  Social History Narrative  . Not on file   Social Determinants of Health   Financial Resource Strain:   . Difficulty of Paying Living Expenses:  Food Insecurity:   . Worried About Programme researcher, broadcasting/film/video in the Last Year:   . Barista in the Last Year:   Transportation Needs:   . Freight forwarder (Medical):   Marland Kitchen Lack of Transportation (Non-Medical):   Physical Activity:   . Days of Exercise per Week:   . Minutes of Exercise per Session:   Stress:   . Feeling of Stress :   Social Connections:   . Frequency of Communication with Friends and Family:   . Frequency of Social Gatherings with Friends and Family:   . Attends Religious Services:   . Active Member of Clubs or Organizations:   . Attends Banker Meetings:   Marland Kitchen Marital Status:   Intimate  Partner Violence:   . Fear of Current or Ex-Partner:   . Emotionally Abused:   Marland Kitchen Physically Abused:   . Sexually Abused:     Physical Exam Cardiovascular:     Rate and Rhythm: Normal rate and regular rhythm.     Pulses: Normal pulses.  Pulmonary:     Effort: Pulmonary effort is normal.     Breath sounds: Normal breath sounds.  Musculoskeletal:        General: Normal range of motion.     Right lower leg: Edema present.     Left lower leg: Edema present.  Skin:    General: Skin is warm and dry.     Capillary Refill: Capillary refill takes less than 2 seconds.  Neurological:     Mental Status: She is alert and oriented to person, place, and time.  Psychiatric:        Mood and Affect: Mood normal.         Future Appointments  Date Time Provider Department Center  02/13/2020 10:00 AM [facility] None  04/29/2020  8:15 AM Alben Spittle, Lorin Picket T, PA-C [facility]    BP 100/70 (BP Location: Left Arm, Patient Position: Sitting, Cuff Size: Normal)   Pulse 84   Resp 16   Wt 240 lb (108.9 kg)   SpO2 96%   BMI 41.20 kg/m   Weight yesterday- 239 lb Last visit weight- 244 lb  Maria Duncan was seen at home today and reported feeling well. She denied chest pain, SOB, headache, dizziness, orthopnea, fever or cough since our last visit. She stated she has been compliant with her medications over the past week and her weight has been trending down. Her medications were verified and she had already refilled her pillbox. I check the box for accuracy and no mistakes were noted. We also went over how to use her new glucometer today and she was self sufficient before I left. I will follow up next week.   [name], EMT 01/31/20  ACTION: Home visit completed Next visit planned for 1 week

## 2020-02-06 ENCOUNTER — Telehealth (HOSPITAL_COMMUNITY): Payer: Self-pay

## 2020-02-06 NOTE — Telephone Encounter (Signed)
I called Ms Gasaway to schedule an appointment. She stated she would be available in the morning and agreed to meet me at 09:00.   [name], EMT 02/06/20

## 2020-02-07 ENCOUNTER — Other Ambulatory Visit (HOSPITAL_COMMUNITY): Payer: Self-pay

## 2020-02-07 ENCOUNTER — Telehealth (HOSPITAL_COMMUNITY): Payer: Self-pay | Admitting: Licensed Clinical Social Worker

## 2020-02-07 MED FILL — SPIRONOLACTONE 25 MG TABS: 25 | 30 days supply | Qty: 30 | Fill #1

## 2020-02-07 MED FILL — TORSEMIDE 20 MG TABLET: 20 | 30 days supply | Qty: 60 | Fill #1

## 2020-02-07 MED FILL — FARXIGA 10 MG TABLET: 10 | 30 days supply | Qty: 30 | Fill #1

## 2020-02-07 NOTE — Telephone Encounter (Signed)
CSW received call from pt requesting assistance with getting a CPAP.  Pt is very concerned about the severity of her OSA and inability to afford a CPAP.    CSW had inquired with [facility] organization who used to provide $100 CPAPs to patients in need.  They are currently working on getting back up and running but anticipate it being several months before they are able to provide CPAPs again.  CSW sent message to inpatient assistant [facility] director to inquire about hospital assistance with CPAP as she is already receiving O2 through their charity program- awaiting determination  Will continue to follow and assist as needed  Burna Sis, LCSW Clinical Social Worker [facility] Desk#: [phone] Cell#: [phone]

## 2020-02-07 NOTE — Progress Notes (Signed)
Paramedicine Encounter    Patient ID: Maria Duncan, female    DOB: [birth year], 54 y.o.   MRN: [id number]   Patient Care Team: System, Pcp Not In as PCP - General Nahser, Deloris Ping, MD as PCP - Cardiology (Cardiology)  Patient Active Problem List   Diagnosis Date Noted  . Acute on chronic combined systolic and diastolic CHF (congestive heart failure) (HCC) 01/24/2020  . Chest pain of uncertain etiology, non obstructive CAD, pain due to acute HF 12/12/2019  . Pulmonary hypertension, unspecified (HCC) 12/12/2019  . OSA (obstructive sleep apnea) 12/12/2019  . Hypoventilation associated with obesity (HCC) 12/12/2019  . CAD in native artery non obstructive on cath 12/09/19 12/12/2019  . NICM (nonischemic cardiomyopathy) (HCC)   . 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 Morbid obesity (HCC)   . CHF exacerbation (HCC) 12/07/2019  . Cardiomegaly 12/07/2019  . Essential hypertension 12/07/2019  . CHF (congestive heart failure) (HCC) 12/07/2019    Current Outpatient Medications:  .  acetaminophen (TYLENOL) 325 MG tablet, Take 2 tablets (650 mg total) by mouth every 4 (four) hours as needed for headache or mild pain., Disp:  , Rfl:  .  aspirin 81 MG EC tablet, Take 1 tablet (81 mg total) by mouth daily., Disp: 90 tablet, Rfl: 3 .  carvedilol (COREG) 6.25 MG tablet, Take 1 tablet (6.25 mg total) by mouth 2 (two) times daily., Disp: 60 tablet, Rfl: 3 .  cholecalciferol (VITAMIN D3) 25 MCG (1000 UNIT) tablet, Take 1,000 Units by mouth daily., Disp: , Rfl:  .  dapagliflozin propanediol (FARXIGA) 10 MG TABS tablet, Take 1 tablet (10 mg total) by mouth daily before breakfast., Disp: 30 tablet, Rfl: 11 .  docusate sodium (COLACE) 100 MG capsule, Take 1 capsule (100 mg total) by mouth 2 (two) times daily., Disp: 10 capsule, Rfl: 0 .  pantoprazole (PROTONIX) 40 MG tablet, Take 1 tablet (40 mg total) by mouth daily., Disp: 30 tablet, Rfl: 1 .  sacubitril-valsartan (ENTRESTO) 24-26  MG, Take 1 tablet by mouth 2 (two) times daily., Disp: 60 tablet, Rfl: 6 .  spironolactone (ALDACTONE) 25 MG tablet, Take 1 tablet (25 mg total) by mouth daily., Disp: 90 tablet, Rfl: 3 .  torsemide (DEMADEX) 20 MG tablet, Take 1 tablet (20 mg total) by mouth 2 (two) times daily., Disp: 60 tablet, Rfl: 3 .  TRUEplus Lancets 28G MISC, 1 each by Does not apply route in the morning and at bedtime., Disp: 100 each, Rfl: 1 .  polyethylene glycol (MIRALAX / GLYCOLAX) 17 g packet, Take 17 g by mouth daily as needed (constipation)., Disp: 14 each, Rfl: 0 Allergies  Allergen Reactions  . Hyzaar [Losartan Potassium-Hctz] Nausea And Vomiting and Cough      Social History   Socioeconomic History  . Marital status: Married    Spouse name: Not on file  . Number of children: Not on file  . Years of education: Not on file  . Highest education level: Not on file  Occupational History  . Not on file  Tobacco Use  . Smoking status: Never Smoker  . Smokeless tobacco: Never Used  Substance and Sexual Activity  . Alcohol use: No  . Drug use: No  . Sexual activity: Not on file  Other Topics Concern  . Not on file  Social History Narrative  . Not on file   Social Determinants of Health   Financial Resource Strain:   . Difficulty of Paying Living Expenses:  Food Insecurity:   . Worried About Programme researcher, broadcasting/film/video in the Last Year:   . Barista in the Last Year:   Transportation Needs:   . Freight forwarder (Medical):   Marland Kitchen Lack of Transportation (Non-Medical):   Physical Activity:   . Days of Exercise per Week:   . Minutes of Exercise per Session:   Stress:   . Feeling of Stress :   Social Connections:   . Frequency of Communication with Friends and Family:   . Frequency of Social Gatherings with Friends and Family:   . Attends Religious Services:   . Active Member of Clubs or Organizations:   . Attends Banker Meetings:   Marland Kitchen Marital Status:   Intimate Partner  Violence:   . Fear of Current or Ex-Partner:   . Emotionally Abused:   Marland Kitchen Physically Abused:   . Sexually Abused:     Physical Exam Cardiovascular:     Rate and Rhythm: Normal rate and regular rhythm.     Pulses: Normal pulses.  Pulmonary:     Effort: Pulmonary effort is normal.     Breath sounds: Normal breath sounds.  Musculoskeletal:        General: Normal range of motion.     Right lower leg: Edema present.     Left lower leg: Edema present.  Skin:    General: Skin is warm and dry.     Capillary Refill: Capillary refill takes less than 2 seconds.  Neurological:     Mental Status: She is alert and oriented to person, place, and time.  Psychiatric:        Mood and Affect: Mood normal.         Future Appointments  Date Time Provider Department Center  02/13/2020 10:00 AM [facility] None  04/29/2020  8:15 AM Alben Spittle, Lorin Picket T, PA-C [facility]    BP 120/83 (BP Location: Left Arm, Patient Position: Sitting, Cuff Size: Large)   Pulse 65   Resp 16   Wt 238 lb 14.4 oz (108.4 kg)   SpO2 97%   BMI 41.01 kg/m   Weight yesterday- 240 lb Last visit weight- 240 lb  Ms Emmons was seen at home today and reported feeling well. She denied chest pain, SOB, headache, dizziness, orthopnea, fever or cough over the pats week. She stated she has been compliant with her medications over the past week and her weight has been stable. Her medications were verified and her pillbox was checked for accuracy. I will follow up in two weeks.   [name], EMT 02/07/20  ACTION: Home visit completed Next visit planned for 2 weeks

## 2020-02-11 ENCOUNTER — Telehealth (HOSPITAL_COMMUNITY): Payer: Self-pay | Admitting: Licensed Clinical Social Worker

## 2020-02-11 NOTE — Telephone Encounter (Signed)
Pt had informed CSW on Friday that she believed she was already enrolled in [employer] hardship program to get her O2 so CSW inquired with [employer] rep, Ian Malkin, if pt could also get a CPAP through that program.  Ian Malkin informed CSW today that pt would be approved to get CPAP through that program.  CSW faxed CPAP order from Dr. Mayford Knife to [employer] for review and to place order for equipment- they are processing now and will contact pt directly to finalize details- unsure of how long this process will take at this time.  CSW called pt and updated- informed her she needed to make sure she was answering calls from 1-800 numbers so she didn't miss [employer] calling to finalize set up.  Will continue to follow and assist as needed  Burna Sis, LCSW Clinical Social Worker [facility] Desk#: [phone] Cell#: [phone]

## 2020-02-13 ENCOUNTER — Inpatient Hospital Stay (HOSPITAL_COMMUNITY): Admission: RE | Admit: 2020-02-13 | Payer: Self-pay | Source: Ambulatory Visit

## 2020-02-14 ENCOUNTER — Other Ambulatory Visit: Payer: Self-pay | Admitting: Cardiology

## 2020-02-14 NOTE — Telephone Encounter (Signed)
This was ordered in hospital on 5/19 by Marjie Skiff, PA due to intermittent coughing/nausea which sometimes occurred after eating.   Should this be refilled by our office or sent to PCP?

## 2020-02-14 NOTE — Telephone Encounter (Signed)
Pt's pharmacy is requesting a refill on pantoprazole. Would Dr. Nahser like to refill this medication? Please address 

## 2020-02-17 ENCOUNTER — Other Ambulatory Visit: Payer: Self-pay | Admitting: Cardiology

## 2020-02-19 ENCOUNTER — Other Ambulatory Visit: Payer: Self-pay | Admitting: Cardiovascular Disease

## 2020-02-19 ENCOUNTER — Inpatient Hospital Stay (HOSPITAL_COMMUNITY)
Admission: RE | Admit: 2020-02-19 | Discharge: 2020-02-19 | Disposition: A | Discharge status: Home / Self Care | Payer: Self-pay | Source: Ambulatory Visit

## 2020-02-19 MED FILL — PANTOPRAZOLE SOD DR 40 MG T: 40 | 30 days supply | Qty: 30 | Fill #0

## 2020-02-25 MED FILL — CARVEDILOL 6.25 MG TABLET: 6.25 | 30 days supply | Qty: 60 | Fill #2

## 2020-02-27 ENCOUNTER — Telehealth (HOSPITAL_COMMUNITY): Payer: Self-pay

## 2020-02-27 NOTE — Telephone Encounter (Signed)
I called Maria Duncan to confirm our appointment tomorrow. She stated she would be ready in the morning as we agreed at 08:30.   [name], EMT 02/27/20

## 2020-02-28 ENCOUNTER — Other Ambulatory Visit (HOSPITAL_COMMUNITY): Payer: Self-pay

## 2020-02-28 NOTE — Progress Notes (Signed)
Paramedicine Encounter    Patient ID: Maria Duncan, female    DOB: 1966/03/14, 54 y.o.   MRN: [id number]   Patient Care Team: System, Pcp Not In as PCP - General Nahser, Deloris Ping, MD as PCP - Cardiology (Cardiology)  Patient Active Problem List   Diagnosis Date Noted  . Acute on chronic combined systolic and diastolic CHF (congestive heart failure) (HCC) 01/24/2020  . Chest pain of uncertain etiology, non obstructive CAD, pain due to acute HF 12/12/2019  . Pulmonary hypertension, unspecified (HCC) 12/12/2019  . OSA (obstructive sleep apnea) 12/12/2019  . Hypoventilation associated with obesity (HCC) 12/12/2019  . CAD in native artery non obstructive on cath 12/09/19 12/12/2019  . NICM (nonischemic cardiomyopathy) (HCC)   . 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 Morbid obesity (HCC)   . CHF exacerbation (HCC) 12/07/2019  . Cardiomegaly 12/07/2019  . Essential hypertension 12/07/2019  . CHF (congestive heart failure) (HCC) 12/07/2019    Current Outpatient Medications:  .  acetaminophen (TYLENOL) 325 MG tablet, Take 2 tablets (650 mg total) by mouth every 4 (four) hours as needed for headache or mild pain., Disp:  , Rfl:  .  aspirin 81 MG EC tablet, Take 1 tablet (81 mg total) by mouth daily., Disp: 90 tablet, Rfl: 3 .  carvedilol (COREG) 6.25 MG tablet, Take 1 tablet (6.25 mg total) by mouth 2 (two) times daily., Disp: 60 tablet, Rfl: 3 .  cholecalciferol (VITAMIN D3) 25 MCG (1000 UNIT) tablet, Take 1,000 Units by mouth daily., Disp: , Rfl:  .  dapagliflozin propanediol (FARXIGA) 10 MG TABS tablet, Take 1 tablet (10 mg total) by mouth daily before breakfast., Disp: 30 tablet, Rfl: 11 .  docusate sodium (COLACE) 100 MG capsule, Take 1 capsule (100 mg total) by mouth 2 (two) times daily., Disp: 10 capsule, Rfl: 0 .  pantoprazole (PROTONIX) 40 MG tablet, TAKE 1 TABLET (40 MG TOTAL) BY MOUTH DAILY., Disp: 30 tablet, Rfl: 11 .  polyethylene glycol (MIRALAX /  GLYCOLAX) 17 g packet, Take 17 g by mouth daily as needed (constipation)., Disp: 14 each, Rfl: 0 .  sacubitril-valsartan (ENTRESTO) 24-26 MG, Take 1 tablet by mouth 2 (two) times daily., Disp: 60 tablet, Rfl: 6 .  spironolactone (ALDACTONE) 25 MG tablet, Take 1 tablet (25 mg total) by mouth daily., Disp: 90 tablet, Rfl: 3 .  torsemide (DEMADEX) 20 MG tablet, Take 1 tablet (20 mg total) by mouth 2 (two) times daily., Disp: 60 tablet, Rfl: 3 .  TRUEplus Lancets 28G MISC, 1 each by Does not apply route in the morning and at bedtime., Disp: 100 each, Rfl: 1 Allergies  Allergen Reactions  . Hyzaar [Losartan Potassium-Hctz] Nausea And Vomiting and Cough      Social History   Socioeconomic History  . Marital status: Married    Spouse name: Not on file  . Number of children: Not on file  . Years of education: Not on file  . Highest education level: Not on file  Occupational History  . Not on file  Tobacco Use  . Smoking status: Never Smoker  . Smokeless tobacco: Never Used  Substance and Sexual Activity  . Alcohol use: No  . Drug use: No  . Sexual activity: Not on file  Other Topics Concern  . Not on file  Social History Narrative  . Not on file   Social Determinants of Health   Financial Resource Strain:   . Difficulty of Paying Living Expenses:  Food Insecurity:   . Worried About Programme researcher, broadcasting/film/video in the Last Year:   . Barista in the Last Year:   Transportation Needs:   . Freight forwarder (Medical):   Marland Kitchen Lack of Transportation (Non-Medical):   Physical Activity:   . Days of Exercise per Week:   . Minutes of Exercise per Session:   Stress:   . Feeling of Stress :   Social Connections:   . Frequency of Communication with Friends and Family:   . Frequency of Social Gatherings with Friends and Family:   . Attends Religious Services:   . Active Member of Clubs or Organizations:   . Attends Banker Meetings:   Marland Kitchen Marital Status:   Intimate  Partner Violence:   . Fear of Current or Ex-Partner:   . Emotionally Abused:   Marland Kitchen Physically Abused:   . Sexually Abused:     Physical Exam Cardiovascular:     Rate and Rhythm: Normal rate and regular rhythm.     Pulses: Normal pulses.  Pulmonary:     Effort: Pulmonary effort is normal.     Breath sounds: Normal breath sounds.  Musculoskeletal:        General: Normal range of motion.     Right lower leg: Edema present.     Left lower leg: Edema present.  Skin:    General: Skin is warm and dry.     Capillary Refill: Capillary refill takes less than 2 seconds.  Neurological:     Mental Status: She is alert and oriented to person, place, and time.  Psychiatric:        Mood and Affect: Mood normal.         Future Appointments  Date Time Provider Department Center  03/11/2020 12:00 PM [facility] None  04/29/2020  8:15 AM Alben Spittle, Lorin Picket T, PA-C [facility]    BP 114/74 (BP Location: Left Arm, Patient Position: Sitting, Cuff Size: Large)   Pulse 66   Resp 16   Wt 237 lb 11.2 oz (107.8 kg)   SpO2 98%   BMI 40.80 kg/m   Weight yesterday- 236 lb Last visit weight- 238 lb  Ms Mongillo was seen at home today and reported feeling generally well. She reported one episode of SOB earlier in the week however she denied having any chest pain, orthopnea, fever or cough. She reported being compliant with her medications over the past week and her weight has been stable. Upon physical assessment she exhibited BLEE. When I talked to her about her swelling and questioned her diet she advised she missed diuretics two days last week and has been eating fast food and popcorn recently. Her husband expressed concern about her going back to her old eating habits so we spent some time talking about the dangers of falling back into those ways. She expressed understanding and said she would not skip medications and would avoid high sodium foods. Her medications were verified  and her pillbox was checked for accuracy. I will follow up in two weeks.   [name], EMT 02/28/20  ACTION: Home visit completed Next visit planned for 2 weeks

## 2020-03-11 ENCOUNTER — Other Ambulatory Visit: Payer: Self-pay

## 2020-03-11 ENCOUNTER — Ambulatory Visit (HOSPITAL_COMMUNITY)
Admission: RE | Admit: 2020-03-11 | Discharge: 2020-03-11 | Disposition: A | Discharge status: Home / Self Care | Payer: Self-pay | Source: Ambulatory Visit | Attending: Internal Medicine | Admitting: Internal Medicine

## 2020-03-11 ENCOUNTER — Encounter (HOSPITAL_COMMUNITY): Payer: Self-pay

## 2020-03-11 VITALS — BP 126/78 | HR 76 | Wt 242.0 lb

## 2020-03-11 DIAGNOSIS — E669 Obesity, unspecified: Secondary | ICD-10-CM | POA: Insufficient documentation

## 2020-03-11 DIAGNOSIS — I110 Hypertensive heart disease with heart failure: Secondary | ICD-10-CM | POA: Insufficient documentation

## 2020-03-11 DIAGNOSIS — I428 Other cardiomyopathies: Secondary | ICD-10-CM | POA: Insufficient documentation

## 2020-03-11 DIAGNOSIS — E119 Type 2 diabetes mellitus without complications: Secondary | ICD-10-CM | POA: Insufficient documentation

## 2020-03-11 DIAGNOSIS — I5022 Chronic systolic (congestive) heart failure: Secondary | ICD-10-CM | POA: Insufficient documentation

## 2020-03-11 DIAGNOSIS — E875 Hyperkalemia: Secondary | ICD-10-CM | POA: Insufficient documentation

## 2020-03-11 DIAGNOSIS — Z6841 Body Mass Index (BMI) 40.0 and over, adult: Secondary | ICD-10-CM | POA: Insufficient documentation

## 2020-03-11 DIAGNOSIS — Z79899 Other long term (current) drug therapy: Secondary | ICD-10-CM | POA: Insufficient documentation

## 2020-03-11 LAB — BASIC METABOLIC PANEL
Anion gap: 12 (ref 5–15)
BUN: 19 mg/dL (ref 6–20)
CO2: 32 mmol/L (ref 22–32)
Calcium: 9.4 mg/dL (ref 8.9–10.3)
Chloride: 96 mmol/L — ABNORMAL LOW (ref 98–111)
Creatinine, Ser: 1.43 mg/dL — ABNORMAL HIGH (ref 0.44–1.00)
GFR calc Af Amer: 48 mL/min — ABNORMAL LOW (ref >60)
GFR calc non Af Amer: 42 mL/min — ABNORMAL LOW (ref >60)
Glucose, Bld: 162 mg/dL — ABNORMAL HIGH (ref 70–99)
Potassium: 3.4 mmol/L — ABNORMAL LOW (ref 3.5–5.1)
Sodium: 140 mmol/L (ref 135–145)

## 2020-03-11 LAB — BRAIN NATRIURETIC PEPTIDE: B Natriuretic Peptide: 67.9 pg/mL (ref 0.0–100.0)

## 2020-03-11 MED ORDER — SACUBITRIL-VALSARTAN 49-51 MG PO TABS: 1.0000 | ORAL_TABLET | Freq: Two times a day (BID) | ORAL | 3 refills | Status: DC

## 2020-03-11 MED FILL — SPIRONOLACTONE 25 MG TABS: 25 | 30 days supply | Qty: 30 | Fill #2

## 2020-03-11 MED FILL — FARXIGA 10 MG TABLET: 10 | 30 days supply | Qty: 30 | Fill #2

## 2020-03-11 MED FILL — TORSEMIDE 20 MG TABLET: 20 | 30 days supply | Qty: 60 | Fill #2

## 2020-03-11 NOTE — Patient Instructions (Addendum)
It was a pleasure seeing you today!  MEDICATIONS: -We are changing your medications today -Increase Entresto to 49/51 mg (1 tablet) twice daily. You may take two tablets of the 24/26 mg strength twice daily until you pick up the new strength.  -Call if you have questions about your medications.  LABS: -We will call you if your labs need attention.  NEXT APPOINTMENT: Return to clinic in 2-3 months with Dr. Gala Romney with echo. Call [phone] to schedule appointment.   In general, to take care of your heart failure: -Limit your fluid intake to 2 Liters (half-gallon) per day.   -Limit your salt intake to ideally 2-3 grams (2000-3000 mg) per day. -Weigh yourself daily and record, and bring that "weight diary" to your next appointment.  (Weight gain of 2-3 pounds in 1 day typically means fluid weight.) -The medications for your heart are to help your heart and help you live longer.   -Please contact us before stopping any of your heart medications.  Call the clinic at [phone] with questions or to reschedule future appointments.

## 2020-03-11 NOTE — Progress Notes (Signed)
[facility] PrimaryHFCardiologist:Dr Bensimhon  HPI: Ms. Hamid is a 54 year old female with a history of HTN, obesity, and recently diagnosed NICM/systolic heart failure.   She was admitted 11/2019 with increased SOB and lower extremity edema. An ECHO was performed and showed a reduced EF of 35-40%. She underwent RHC/LHC that demonstrated minimal CAD, elevated filling pressures, and preserved cardiac output. She was diuresed with IV lasix and transitioned to torsemide 20 mg daily. Additionally, there was concern for sleep apnea.  Recently presented to HF Clinic with NP Amy Clegg on 12/26/2019 for hospital follow-up. Overall she felt fine but was experiencing SOB with exertion and frequent coughing. She was using 3 liters of oxygen nightly. Shedenied PND/Orthopnea.Patient reported taking all medications as prescribed and was approved for [facility] patient assistance through [employer].  Returned to HF clinic with husband for pharmacist medication titration on 01/14/20. At last visit with NP, carvedilol 6.25 mg BID was initiated and patient reported tolerating therapy well. Was taking all medications as prescribed and tolerating all medications. She felt well with no dizziness, lightheadedness, or syncope. No chest pain or palpitations. Breathing had improved with dyspnea only on strenuous exertion. Patient was able to walk more daily. She reported some LEE but denied orthopnea/PND. Weight at home had been stable. She was attempting to reduce sodium intake. Reported not eating pretzels as much, cooking with Mrs. DASH salt, and improving portion control. She was able to perform all ADLs independently.   Today she returns to HF clinic for pharmacist medication titration. At last visit with pharmacy, Farxiga 10 mg was initiated. Endorses feeling good since last visit. Denies chest pain, palpitations. Denies dizziness and lightheadedness. Reports fatigue, SOB when working  >6 hours, but now only works 4 hours daily and feels great. Home weights have been stable and range from 236-238 lbs on torsemide 20 mg BID. No LEE on exam. No PND or orthopnea. Reports occasional edema at home when she "cheats" and eats out (fast food) or consumes excess fluids. Reinforced low salt diet.   HF Medications: Carvedilol 6.25 mg BID Entresto 24/26 mg BID Spironolactone 25 mg daily Farxiga 10 mg daily Torsemide 20 mg BID  Has the patient been experiencing any side effects to the medications prescribed?  no  Does the patient have any problems obtaining medications due to transportation or finances?   Patient is uninsured but receives medications from HF fund at [facility]. Tammy Sours through [employer] patient assistance.   Understanding of regimen: good Understanding of indications: good Potential of compliance: good Patient understands to avoid NSAIDs. Patient understands to avoid decongestants.   Pertinent Lab Values: 03/11/2020 . Serum creatinine 1.43, BUN 19, Potassium 3.4, Sodium 140, BNP 67.9 pg/mL   Vital Signs: . Weight: 242 lbs (last clinic weight: 242.8 lbs) . Blood pressure: 126/78  . Heart rate: 76 bpm  Assessment: 1. Chronic Systolic Heart Failure, NICM ECHO 11/2019 EF 30-35% RV function moderately reduced.  Cath 11/2019 with minimal cardiac disease and preserved cardiac output.EF on cath 25%. - NYHAIII.Euvolemic on exam.  - Labs: Scr 1.43 which is within her normal baseline range (1.0-1.5). K is low at 3.4. Will increase Entresto today as below.  - Continue torsemide 20 mg BID - Continue carvedilol 6.25 mg BID  - Increase Entresto to 49/51 mg BID - Continuespironolactone25 mg daily - Continue Farxiga 10 mg daily. - Discussed low salt diet and medication compliance  2. HTN - BP at goal in clinic today, 126/78  3.  Obesity - Body mass index is 41.68 kg/m. - Discussed dietary changes.  4. Suspected Sleep  Apnea -Needs sleep study but does not have insurance.  5. Hyperkalemia - History of elevated K+.  - K+ 3.4 on BMET today. Increase Entresto as above.    6. DMII -Followed by PCP   Plan: 1) Medication changes: Based on clinical presentation, vital signs and recent labs will increase Entresto to 49/51 mg BID. 2) Follow-up: HF Clinic with Dr. Gala Romney in 2 months with echo.   Karle Plumber, PharmD, BCPS, BCCP, CPP Heart Failure Clinic Pharmacist [phone]

## 2020-03-12 ENCOUNTER — Telehealth (HOSPITAL_COMMUNITY): Payer: Self-pay

## 2020-03-12 NOTE — Telephone Encounter (Signed)
I called Ms Burkel to schedule an appointment for this week. She stated she would not be able to meet today but asked that I come tomorrow morning. We agreed to meet at 08:30.   [name], EMT 03/12/20

## 2020-03-13 ENCOUNTER — Other Ambulatory Visit (HOSPITAL_COMMUNITY): Payer: Self-pay

## 2020-03-13 NOTE — Progress Notes (Signed)
Paramedicine Encounter    Patient ID: Maria Duncan, female    DOB: 01-23-1966, 54 y.o.   MRN: [id number]   Patient Care Team: System, Pcp Not In as PCP - General Nahser, Deloris Ping, MD as PCP - Cardiology (Cardiology)  Patient Active Problem List   Diagnosis Date Noted   Acute on chronic combined systolic and diastolic CHF (congestive heart failure) (HCC) 01/24/2020   Chest pain of uncertain etiology, non obstructive CAD, pain due to acute HF 12/12/2019   Pulmonary hypertension, unspecified (HCC) 12/12/2019   OSA (obstructive sleep apnea) 12/12/2019   Hypoventilation associated with obesity (HCC) 12/12/2019   CAD in native artery non obstructive on cath 12/09/19 12/12/2019   NICM (nonischemic cardiomyopathy) (HCC)    Acute combined systolic and diastolic HF (heart failure) (HCC)    Morbid obesity (HCC)    CHF exacerbation (HCC) 12/07/2019   Cardiomegaly 12/07/2019   Essential hypertension 12/07/2019   CHF (congestive heart failure) (HCC) 12/07/2019    Current Outpatient Medications:    acetaminophen (TYLENOL) 325 MG tablet, Take 2 tablets (650 mg total) by mouth every 4 (four) hours as needed for headache or mild pain., Disp:  , Rfl:    aspirin 81 MG EC tablet, Take 1 tablet (81 mg total) by mouth daily., Disp: 90 tablet, Rfl: 3   carvedilol (COREG) 6.25 MG tablet, Take 1 tablet (6.25 mg total) by mouth 2 (two) times daily., Disp: 60 tablet, Rfl: 3   cholecalciferol (VITAMIN D3) 25 MCG (1000 UNIT) tablet, Take 1,000 Units by mouth daily., Disp: , Rfl:    dapagliflozin propanediol (FARXIGA) 10 MG TABS tablet, Take 1 tablet (10 mg total) by mouth daily before breakfast., Disp: 30 tablet, Rfl: 11   docusate sodium (COLACE) 100 MG capsule, Take 1 capsule (100 mg total) by mouth 2 (two) times daily., Disp: 10 capsule, Rfl: 0   pantoprazole (PROTONIX) 40 MG tablet, TAKE 1 TABLET (40 MG TOTAL) BY MOUTH DAILY., Disp: 30 tablet, Rfl: 11   polyethylene glycol (MIRALAX /  GLYCOLAX) 17 g packet, Take 17 g by mouth daily as needed (constipation)., Disp: 14 each, Rfl: 0   sacubitril-valsartan (ENTRESTO) 49-51 MG, Take 1 tablet by mouth 2 (two) times daily., Disp: 180 tablet, Rfl: 3   spironolactone (ALDACTONE) 25 MG tablet, Take 1 tablet (25 mg total) by mouth daily., Disp: 90 tablet, Rfl: 3   torsemide (DEMADEX) 20 MG tablet, Take 1 tablet (20 mg total) by mouth 2 (two) times daily., Disp: 60 tablet, Rfl: 3   TRUEplus Lancets 28G MISC, 1 each by Does not apply route in the morning and at bedtime., Disp: 100 each, Rfl: 1 Allergies  Allergen Reactions   Hyzaar [Losartan Potassium-Hctz] Nausea And Vomiting and Cough      Social History   Socioeconomic History   Marital status: Married    Spouse name: Not on file   Number of children: Not on file   Years of education: Not on file   Highest education level: Not on file  Occupational History   Not on file  Tobacco Use   Smoking status: Never Smoker   Smokeless tobacco: Never Used  Substance and Sexual Activity   Alcohol use: No   Drug use: No   Sexual activity: Not on file  Other Topics Concern   Not on file  Social History Narrative   Not on file   Social Determinants of Health   Financial Resource Strain:    Difficulty of Paying Living Expenses: Not on  file  Food Insecurity:    Worried About Programme researcher, broadcasting/film/video in the Last Year: Not on file   [employer] of Food in the Last Year: Not on file  Transportation Needs:    Lack of Transportation (Medical): Not on file   Lack of Transportation (Non-Medical): Not on file  Physical Activity:    Days of Exercise per Week: Not on file   Minutes of Exercise per Session: Not on file  Stress:    Feeling of Stress : Not on file  Social Connections:    Frequency of Communication with Friends and Family: Not on file   Frequency of Social Gatherings with Friends and Family: Not on file   Attends Religious Services: Not on file    Active Member of Clubs or Organizations: Not on file   Attends Banker Meetings: Not on file   Marital Status: Not on file  Intimate Partner Violence:    Fear of Current or Ex-Partner: Not on file   Emotionally Abused: Not on file   Physically Abused: Not on file   Sexually Abused: Not on file    Physical Exam Cardiovascular:     Rate and Rhythm: Normal rate and regular rhythm.     Pulses: Normal pulses.  Pulmonary:     Effort: Pulmonary effort is normal.     Breath sounds: Normal breath sounds.  Musculoskeletal:        General: Normal range of motion.     Right lower leg: Edema present.     Left lower leg: Edema present.  Skin:    General: Skin is warm and dry.     Capillary Refill: Capillary refill takes less than 2 seconds.  Neurological:     Mental Status: She is alert and oriented to person, place, and time.  Psychiatric:        Mood and Affect: Mood normal.         Future Appointments  Date Time Provider Department Center  04/29/2020  8:15 AM Tereso Newcomer T, PA-C [facility]  05/11/2020  8:00 AM [facility] OP 1 [facility]  05/11/2020  9:00 AM Bensimhon, Bevelyn Buckles, MD [facility] None    BP 106/65 (BP Location: Left Arm, Patient Position: Sitting, Cuff Size: Large)    Pulse 64    Resp 18    Wt 236 lb 8 oz (107.3 kg)    SpO2 97%    BMI 40.60 kg/m   Weight yesterday- 238 lb Last visit weight- 237 lb  [name] was seen at home today and reported feeling generally well. She denied chest pain, SOB, headache, dizziness, orthopnea, fever or cough over the past week. She stated she has been compliant with her medications over the past week and her weight has been stable. Her medications were verified and her pillbox was refilled. I will follow up next week.   [name], EMT 03/13/20  ACTION: Home visit completed Next visit planned for 1 week

## 2020-03-25 NOTE — Telephone Encounter (Addendum)
Upon patient request DME selection is [facility] Patient understands she will be contacted by [facility] to set up her cpap. Patient understands to call if [facility] does not contact her with new setup in a timely manner. Patient understands they will be called once confirmation has been received from [facility] that they have received their new machine to schedule 10 week follow up appointment.  [facility] notified of new cpap order  Please add to airview Patient was grateful for the call and thanked me  Patient has a 10 week follow up appointment scheduled for 11/3//21. Patient understands she needs to keep this appointment for insurance compliance. Patient was grateful for the call and thanked me.

## 2020-03-27 ENCOUNTER — Telehealth (HOSPITAL_COMMUNITY): Payer: Self-pay

## 2020-03-27 MED FILL — PANTOPRAZOLE SOD DR 40 MG T: 40 | 30 days supply | Qty: 30 | Fill #1

## 2020-03-27 MED FILL — CARVEDILOL 6.25 MG TABLET: 6.25 | 30 days supply | Qty: 60 | Fill #3

## 2020-03-27 NOTE — Telephone Encounter (Signed)
[name] called me to advise that she would not be able to keep our appointment this morning because she needed to have her CPAP machine serviced at a different doctors office. She stated she was doing well and had already refilled her pillbox. We discussed discharge from paramedicine due to her overall competence and progress and she was agreeable. I will follow up next week and confirm that she is ready for discharge.  [name], EMT

## 2020-04-03 ENCOUNTER — Other Ambulatory Visit (HOSPITAL_COMMUNITY): Payer: Self-pay

## 2020-04-03 NOTE — Progress Notes (Signed)
Paramedicine Encounter    Patient ID: Maria Duncan, female    DOB: Jan 19, 1966, 54 y.o.   MRN: [id number]   Patient Care Team: System, Pcp Not In as PCP - General Nahser, Deloris Ping, MD as PCP - Cardiology (Cardiology)  Patient Active Problem List   Diagnosis Date Noted  . Acute on chronic combined systolic and diastolic CHF (congestive heart failure) (HCC) 01/24/2020  . Chest pain of uncertain etiology, non obstructive CAD, pain due to acute HF 12/12/2019  . Pulmonary hypertension, unspecified (HCC) 12/12/2019  . OSA (obstructive sleep apnea) 12/12/2019  . Hypoventilation associated with obesity (HCC) 12/12/2019  . CAD in native artery non obstructive on cath 12/09/19 12/12/2019  . NICM (nonischemic cardiomyopathy) (HCC)   . 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 Morbid obesity (HCC)   . CHF exacerbation (HCC) 12/07/2019  . Cardiomegaly 12/07/2019  . Essential hypertension 12/07/2019  . CHF (congestive heart failure) (HCC) 12/07/2019    Current Outpatient Medications:  .  acetaminophen (TYLENOL) 325 MG tablet, Take 2 tablets (650 mg total) by mouth every 4 (four) hours as needed for headache or mild pain., Disp:  , Rfl:  .  aspirin 81 MG EC tablet, Take 1 tablet (81 mg total) by mouth daily., Disp: 90 tablet, Rfl: 3 .  carvedilol (COREG) 6.25 MG tablet, Take 1 tablet (6.25 mg total) by mouth 2 (two) times daily., Disp: 60 tablet, Rfl: 3 .  cholecalciferol (VITAMIN D3) 25 MCG (1000 UNIT) tablet, Take 1,000 Units by mouth daily., Disp: , Rfl:  .  dapagliflozin propanediol (FARXIGA) 10 MG TABS tablet, Take 1 tablet (10 mg total) by mouth daily before breakfast., Disp: 30 tablet, Rfl: 11 .  docusate sodium (COLACE) 100 MG capsule, Take 1 capsule (100 mg total) by mouth 2 (two) times daily., Disp: 10 capsule, Rfl: 0 .  pantoprazole (PROTONIX) 40 MG tablet, TAKE 1 TABLET (40 MG TOTAL) BY MOUTH DAILY., Disp: 30 tablet, Rfl: 11 .  sacubitril-valsartan (ENTRESTO)  49-51 MG, Take 1 tablet by mouth 2 (two) times daily., Disp: 180 tablet, Rfl: 3 .  spironolactone (ALDACTONE) 25 MG tablet, Take 1 tablet (25 mg total) by mouth daily., Disp: 90 tablet, Rfl: 3 .  torsemide (DEMADEX) 20 MG tablet, Take 1 tablet (20 mg total) by mouth 2 (two) times daily., Disp: 60 tablet, Rfl: 3 .  TRUEplus Lancets 28G MISC, 1 each by Does not apply route in the morning and at bedtime., Disp: 100 each, Rfl: 1 .  polyethylene glycol (MIRALAX / GLYCOLAX) 17 g packet, Take 17 g by mouth daily as needed (constipation)., Disp: 14 each, Rfl: 0 Allergies  Allergen Reactions  . Hyzaar [Losartan Potassium-Hctz] Nausea And Vomiting and Cough      Social History   Socioeconomic History  . Marital status: Married    Spouse name: Not on file  . Number of children: Not on file  . Years of education: Not on file  . Highest education level: Not on file  Occupational History  . Not on file  Tobacco Use  . Smoking status: Never Smoker  . Smokeless tobacco: Never Used  Substance and Sexual Activity  . Alcohol use: No  . Drug use: No  . Sexual activity: Not on file  Other Topics Concern  . Not on file  Social History Narrative  . Not on file   Social Determinants of Health   Financial Resource Strain:   . Difficulty of Paying Living Expenses: Not on  file  Food Insecurity:   . Worried About Programme researcher, broadcasting/film/video in the Last Year: Not on file  . Ran Out of Food in the Last Year: Not on file  Transportation Needs:   . Lack of Transportation (Medical): Not on file  . Lack of Transportation (Non-Medical): Not on file  Physical Activity:   . Days of Exercise per Week: Not on file  . Minutes of Exercise per Session: Not on file  Stress:   . Feeling of Stress : Not on file  Social Connections:   . Frequency of Communication with Friends and Family: Not on file  . Frequency of Social Gatherings with Friends and Family: Not on file  . Attends Religious Services: Not on file  .  Active Member of Clubs or Organizations: Not on file  . Attends Banker Meetings: Not on file  . Marital Status: Not on file  Intimate Partner Violence:   . Fear of Current or Ex-Partner: Not on file  . Emotionally Abused: Not on file  . Physically Abused: Not on file  . Sexually Abused: Not on file    Physical Exam Cardiovascular:     Rate and Rhythm: Normal rate and regular rhythm.     Pulses: Normal pulses.  Pulmonary:     Effort: Pulmonary effort is normal.     Breath sounds: Normal breath sounds.  Musculoskeletal:        General: Normal range of motion.     Right lower leg: No edema.     Left lower leg: No edema.  Skin:    General: Skin is warm and dry.     Capillary Refill: Capillary refill takes less than 2 seconds.  Neurological:     Mental Status: She is alert and oriented to person, place, and time.  Psychiatric:        Mood and Affect: Mood normal.         Future Appointments  Date Time Provider Department Center  04/13/2020  8:00 AM Quintella Reichert, MD [facility]  04/29/2020  8:15 AM Tereso Newcomer T, PA-C [facility]  05/11/2020  8:00 AM [facility] OP 1 [facility]  05/11/2020  9:00 AM Bensimhon, Bevelyn Buckles, MD [facility] None    BP 120/81 (BP Location: Left Arm, Patient Position: Sitting, Cuff Size: Large)   Pulse 72   Resp 16   Wt 232 lb (105.2 kg)   SpO2 98%   BMI 39.82 kg/m   Weight yesterday- 324 lb Last visit weight- 236 lb  Ms Fredenburg was seen at home today and reported feeling well. She denied chest pain, SOB, headache, dizziness, orthopnea, fever or cough over the past week. She reported being compliant with her medications and her weight has been stable. She has been exercising and eating healthier per her husband and is now completely independent filling her pillbox. 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 I agreed with this an have made the clinic aware of  same.   [name], EMT 04/03/20  ACTION: Home visit completed

## 2020-04-13 ENCOUNTER — Telehealth: Payer: Self-pay | Admitting: Cardiology

## 2020-04-17 MED FILL — FARXIGA 10 MG TABLET: 10 | 30 days supply | Qty: 30 | Fill #3

## 2020-04-17 MED FILL — SPIRONOLACTONE 25 MG TABS: 25 | 30 days supply | Qty: 30 | Fill #3

## 2020-04-17 MED FILL — TORSEMIDE 20 MG TABLET: 20 | 30 days supply | Qty: 60 | Fill #3

## 2020-04-20 ENCOUNTER — Other Ambulatory Visit (HOSPITAL_COMMUNITY): Payer: Self-pay | Admitting: Adult Health

## 2020-04-20 MED FILL — PANTOPRAZOLE SOD DR 40 MG T: 40 | 30 days supply | Qty: 30 | Fill #2

## 2020-04-23 ENCOUNTER — Other Ambulatory Visit (HOSPITAL_COMMUNITY): Payer: Self-pay | Admitting: Internal Medicine

## 2020-04-23 MED FILL — CARVEDILOL 6.25 MG TABLET: 6.25 | 30 days supply | Qty: 60 | Fill #0

## 2020-04-28 NOTE — Progress Notes (Deleted)
Cardiology Office Note  Date:  04/28/2020   ID:  Maria Duncan, DOB 07-17-1966, MRN [id number]  PCP:  Pcp, No  Cardiologist: Dr. Elease Hashimoto _____________  3 month follow-up  _____________   History of Present Illness: Maria Duncan is a 54 y.o. female with pmh of CHF, HTN, obesity, NICM with EF down to 20%, CAD, OSA who presents for 3 month follow-up. The patient was first seen during an admission for heart failure. Echo showed EF showed reduced EF down to 25%. HE had a R/L heart cath with minimal CAD and elevated filling pressures and preserved CO. Patient was diuresed and transitioned to torsemide. She was een by advanced CHF 12/26/19 and was overall feeling fine. Using 3L O2 at night. Entresto had been approved. Lopressor was stopped and coreg was started. Plan to repeat echo in 3 months.   Today, she denies symptoms of palpitations, chest pain, shortness of breath, orthopnea, PND, lower extremity edema, claudication, dizziness, presyncope, syncope, bleeding, or neurologic sequela. The patient is tolerating medications without difficulties and is otherwise without complaint today.    _____________   Past Medical History:  Diagnosis Date  . 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 Chest pain of uncertain etiology, non obstructive CAD, pain due to acute HF 12/12/2019  . Hypertension   . Hypoventilation associated with obesity (HCC) 12/12/2019  . Morbid obesity (HCC)   . NICM (nonischemic cardiomyopathy) (HCC)   . OSA (obstructive sleep apnea) 12/12/2019  . Pulmonary hypertension, unspecified (HCC) 12/12/2019   Past Surgical History:  Procedure Laterality Date  . ABDOMINAL HYSTERECTOMY    . RIGHT/LEFT HEART CATH AND CORONARY ANGIOGRAPHY N/A 12/09/2019   Procedure: RIGHT/LEFT HEART CATH AND CORONARY ANGIOGRAPHY;  Surgeon: Dolores Patty, MD;  [facility];  Service: Cardiovascular;  Laterality: N/A;   _____________  Current Outpatient  Medications  Medication Sig Dispense Refill  . acetaminophen (TYLENOL) 325 MG tablet Take 2 tablets (650 mg total) by mouth every 4 (four) hours as needed for headache or mild pain.    Marland Kitchen aspirin 81 MG EC tablet Take 1 tablet (81 mg total) by mouth daily. 90 tablet 3  . carvedilol (COREG) 6.25 MG tablet Take 1 tablet (6.25 mg total) by mouth 2 (two) times daily. 60 tablet 3  . cholecalciferol (VITAMIN D3) 25 MCG (1000 UNIT) tablet Take 1,000 Units by mouth daily.    . dapagliflozin propanediol (FARXIGA) 10 MG TABS tablet Take 1 tablet (10 mg total) by mouth daily before breakfast. 30 tablet 11  . docusate sodium (COLACE) 100 MG capsule Take 1 capsule (100 mg total) by mouth 2 (two) times daily. 10 capsule 0  . pantoprazole (PROTONIX) 40 MG tablet TAKE 1 TABLET (40 MG TOTAL) BY MOUTH DAILY. 30 tablet 11  . polyethylene glycol (MIRALAX / GLYCOLAX) 17 g packet Take 17 g by mouth daily as needed (constipation). 14 each 0  . sacubitril-valsartan (ENTRESTO) 49-51 MG Take 1 tablet by mouth 2 (two) times daily. 180 tablet 3  . spironolactone (ALDACTONE) 25 MG tablet Take 1 tablet (25 mg total) by mouth daily. 90 tablet 3  . torsemide (DEMADEX) 20 MG tablet Take 1 tablet (20 mg total) by mouth 2 (two) times daily. 60 tablet 3  . TRUEplus Lancets 28G MISC 1 each by Does not apply route in the morning and at bedtime. 100 each 1   No current facility-administered medications for this visit.   _____________   Allergies:  Hyzaar [losartan potassium-hctz]  _____________   Social History:  The patient  reports that she has never smoked. She has never used smokeless tobacco. She reports that she does not drink alcohol and does not use drugs.  _____________   Family History:  The patient's family history includes Cancer in her maternal grandfather, maternal grandmother, mother, and paternal grandfather; Diabetes in her father, maternal grandfather, and paternal grandmother; Heart disease in her maternal  grandfather; Hyperlipidemia in her brother, mother, and sister; Hypertension in her brother, father, maternal grandfather, paternal grandfather, paternal grandmother, and sister; Stroke in her maternal grandmother and paternal grandfather.  _____________   ROS:  Please see the history of present illness.   Positive for ***,   All other systems are reviewed and negative.  _____________   PHYSICAL EXAM: VS:  There were no vitals taken for this visit. , BMI There is no height or weight on file to calculate BMI. GEN: Well nourished, well developed, in no acute distress  HEENT: normal  Neck: no JVD, carotid bruits, or masses Cardiac: RRR; no murmurs, rubs, or gallops. No clubbing, cyanosis, edema.  Radials/DP/PT 2+ and equal bilaterally.  Respiratory:  clear to auscultation bilaterally, normal work of breathing GI: soft, nontender, nondistended, + BS MS: no deformity or atrophy  Skin: warm and dry, no rash Neuro:  Strength and sensation are intact Psych: euthymic mood, full affect _____________  EKG:   The ekg ordered today shows ***  Recent Labs: 12/08/2019: ALT 74; Magnesium 1.7; Platelets 225; TSH 2.987 12/09/2019: Hemoglobin 15.3 12/19/2019: NT-Pro BNP 957 03/11/2020: B Natriuretic Peptide 67.9; BUN 19; Creatinine, Ser 1.43; Potassium 3.4; Sodium 140  12/08/2019: Cholesterol 204; HDL 65; LDL Cholesterol 123; Total CHOL/HDL Ratio 3.1; Triglycerides 82; VLDL 16  CrCl cannot be calculated (Patient's most recent lab result is older than the maximum 21 days allowed.).  Wt Readings from Last 3 Encounters:  04/03/20 232 lb (105.2 kg)  03/13/20 236 lb 8 oz (107.3 kg)  03/11/20 242 lb (109.8 kg)    ECHO 11/2019 Left ventricular ejection fraction, by estimation, is 35 to 40%. The left ventricle has moderately decreased function. The left ventricle demonstrates global hypokinesis. There is moderate left ventricular hypertrophy. Left ventricular diastolic parameters are consistent with Grade I  diastolic dysfunction (impaired relaxation).  2. Right ventricular systolic function is moderately reduced.   RHC/LHC 11/2019 Ao = 131/86 (109) LV = 138/29 RA = 9 RV = 58/15 PA = 63/23 (38) PCW = 21 Fick cardiac output/index = 5.2/2.4 PVR = 3.2 WU FA sat = 93% PA sat = 62%, 67% ABG: 7.27/88/81/93%  Assessment: 1. 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 Severe NICM EF 25% suspect due to HTN and undiagnosed OSA 3. Elevated filling pressures with normal cardiac output 4. OHS/OSA with CO2 retention (patient received 2mg  versed and fentanyl during cath)  _____________   ASSESSMENT AND PLAN:  Chronic systolic CHF/NICM - Echo 11/2019 showed EF 30-35% with RV moderately reduced - Cath showed minimal CAD and preserved CP. EF on cath 25% - Torsemide 20 mg BID - Coreg 6.25mg  BID - Entresto 24-26mg  daily - Repeat echo today - Low salt diet  HTN - elevated  Obesity  OSA - CPAP??  DM2 - followed by PCP  Disposition:   FU with ***   Signed, Saif Peter 12/2019, NP 04/28/2020 10:04 PM    _____________ [facility] [address] [place]  [phone] (office) [phone] (fax)

## 2020-04-29 ENCOUNTER — Ambulatory Visit: Payer: Self-pay | Admitting: Physician Assistant

## 2020-05-01 MED FILL — PANTOPRAZOLE SOD DR 40 MG T: 40 | 30 days supply | Qty: 30 | Fill #2

## 2020-05-01 MED FILL — CARVEDILOL 6.25 MG TABLET: 6.25 | 30 days supply | Qty: 60 | Fill #0

## 2020-05-08 ENCOUNTER — Other Ambulatory Visit (HOSPITAL_COMMUNITY): Payer: Self-pay | Admitting: Cardiology

## 2020-05-08 DIAGNOSIS — I5042 Chronic combined systolic (congestive) and diastolic (congestive) heart failure: Secondary | ICD-10-CM

## 2020-05-10 NOTE — Progress Notes (Signed)
ADVANCED HF CLINIC NOTE   Primary Care: [facility].  Primary HF Cardiologist: Dr Gala Romney  HPI: [name] is  54 year old with a history of HTN, obesity, and recently diagnosed NICM/systolic heart failure.   Admitted 5/21 with new onset HF. ECHO EF 35-40%. RV moderately HK.  RHC/LHC with minimal CAD and elevated filling pressures and preserved cardiac output. Diuresed with IV lasix and transitioned to torsemide 20 mg daily. Also concern for sleep apnea.   Has been following with HF PharmD. Meds titrated. Following with Paramedicine - just graduated.  Returns for HF f/u. Feels good. Denies SOB, orthopnea or PND. No edema. Watching diet. Got her meds straightened out.  Echo today 05/11/20: EF 55% RV ok Personally reviewed   Studies:  ECHO 11/2019 Left ventricular ejection fraction, by estimation, is 35 to 40%. The left ventricle has moderately decreased function. The left ventricle demonstrates global hypokinesis. There is moderate left ventricular hypertrophy. Left ventricular diastolic parameters are consistent with Grade I diastolic dysfunction (impaired relaxation).  2. Right ventricular systolic function is moderately reduced.   RHC/LHC 11/2019 Ao = 131/86 (109) LV = 138/29 RA = 9 RV = 58/15 PA = 63/23 (38) PCW = 21 Fick cardiac output/index = 5.2/2.4 PVR = 3.2 WU FA sat = 93% PA sat = 62%, 67% ABG: 7.27/88/81/93% Assessment: 1. 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 Severe NICM EF 25% suspect due to HTN and undiagnosed OSA 3. Elevated filling pressures with normal cardiac output 4. OHS/OSA with CO2 retention (patient received 2mg  versed and fentanyl during cath)    Past Medical History:  Diagnosis Date  . 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 Chest pain of uncertain etiology, non obstructive CAD, pain due to acute HF 12/12/2019  . CHF (congestive heart failure) (HCC)   . Diabetes mellitus without  complication (HCC)   . Hypertension   . Hypoventilation associated with obesity (HCC) 12/12/2019  . Morbid obesity (HCC)   . NICM (nonischemic cardiomyopathy) (HCC)   . OSA (obstructive sleep apnea) 12/12/2019  . Pulmonary hypertension, unspecified (HCC) 12/12/2019    Current Outpatient Medications  Medication Sig Dispense Refill  . acetaminophen (TYLENOL) 325 MG tablet Take 2 tablets (650 mg total) by mouth every 4 (four) hours as needed for headache or mild pain.    12/14/2019 aspirin 81 MG EC tablet Take 1 tablet (81 mg total) by mouth daily. 90 tablet 3  . carvedilol (COREG) 6.25 MG tablet Take 1 tablet (6.25 mg total) by mouth 2 (two) times daily. 60 tablet 3  . cholecalciferol (VITAMIN D3) 25 MCG (1000 UNIT) tablet Take 1,000 Units by mouth daily.    . dapagliflozin propanediol (FARXIGA) 10 MG TABS tablet Take 1 tablet (10 mg total) by mouth daily before breakfast. 30 tablet 11  . docusate sodium (COLACE) 100 MG capsule Take 1 capsule (100 mg total) by mouth 2 (two) times daily. 10 capsule 0  . pantoprazole (PROTONIX) 40 MG tablet TAKE 1 TABLET (40 MG TOTAL) BY MOUTH DAILY. 30 tablet 11  . polyethylene glycol (MIRALAX / GLYCOLAX) 17 g packet Take 17 g by mouth daily as needed (constipation). 14 each 0  . sacubitril-valsartan (ENTRESTO) 49-51 MG Take 1 tablet by mouth 2 (two) times daily. 180 tablet 3  . spironolactone (ALDACTONE) 25 MG tablet Take 1 tablet (25 mg total) by mouth daily. 90 tablet 3  . torsemide (DEMADEX) 20 MG tablet Take 1 tablet (20 mg total)  by mouth 2 (two) times daily. 60 tablet 3  . TRUEplus Lancets 28G MISC 1 each by Does not apply route in the morning and at bedtime. 100 each 1   No current facility-administered medications for this encounter.    Allergies  Allergen Reactions  . Hyzaar [Losartan Potassium-Hctz] Nausea And Vomiting and Cough      Social History   Socioeconomic History  . Marital status: Married    Spouse name: Not on file  . Number of children:  Not on file  . Years of education: Not on file  . Highest education level: Not on file  Occupational History  . Not on file  Tobacco Use  . Smoking status: Never Smoker  . Smokeless tobacco: Never Used  Substance and Sexual Activity  . Alcohol use: No  . Drug use: No  . Sexual activity: Not on file  Other Topics Concern  . Not on file  Social History Narrative  . Not on file   Social Determinants of Health   Financial Resource Strain:   . Difficulty of Paying Living Expenses: Not on file  Food Insecurity:   . Worried About Programme researcher, broadcasting/film/video in the Last Year: Not on file  . Ran Out of Food in the Last Year: Not on file  Transportation Needs:   . Lack of Transportation (Medical): Not on file  . Lack of Transportation (Non-Medical): Not on file  Physical Activity:   . Days of Exercise per Week: Not on file  . Minutes of Exercise per Session: Not on file  Stress:   . Feeling of Stress : Not on file  Social Connections:   . Frequency of Communication with Friends and Family: Not on file  . Frequency of Social Gatherings with Friends and Family: Not on file  . Attends Religious Services: Not on file  . Active Member of Clubs or Organizations: Not on file  . Attends Banker Meetings: Not on file  . Marital Status: Not on file  Intimate Partner Violence:   . Fear of Current or Ex-Partner: Not on file  . Emotionally Abused: Not on file  . Physically Abused: Not on file  . Sexually Abused: Not on file      Family History  Problem Relation Age of Onset  . Cancer Mother   . Hyperlipidemia Mother   . Diabetes Father   . Hypertension Father   . Hyperlipidemia Sister   . Hypertension Sister   . Hyperlipidemia Brother   . Hypertension Brother   . Cancer Maternal Grandmother   . Stroke Maternal Grandmother   . Cancer Maternal Grandfather   . Diabetes Maternal Grandfather   . Heart disease Maternal Grandfather   . Hypertension Maternal Grandfather   .  Diabetes Paternal Grandmother   . Hypertension Paternal Grandmother   . Cancer Paternal Grandfather   . Hypertension Paternal Grandfather   . Stroke Paternal Grandfather     Vitals:   05/11/20 0923  BP: 100/60  Pulse: 66  SpO2: 97%  Weight: 107.7 kg (237 lb 6.4 oz)  Height: 5' 4.5" (1.638 m)   Wt Readings from Last 3 Encounters:  05/11/20 107.7 kg (237 lb 6.4 oz)  04/03/20 105.2 kg (232 lb)  03/13/20 107.3 kg (236 lb 8 oz)    PHYSICAL EXAM: General:  Well appearing. No resp difficulty HEENT: normal Neck: supple. no JVD. Carotids 2+ bilat; no bruits. No lymphadenopathy or thryomegaly appreciated. Cor: PMI nondisplaced. Regular rate & rhythm. No  rubs, gallops or murmurs. Lungs: clear Abdomen: soft, nontender, nondistended. No hepatosplenomegaly. No bruits or masses. Good bowel sounds. Extremities: no cyanosis, clubbing, rash, edema Neuro: alert & orientedx3, cranial nerves grossly intact. moves all 4 extremities w/o difficulty. Affect pleasant  ECG: NSR 70 No ST-T wave abnormalities. Personally reviewed   ASSESSMENT & PLAN:  1. Chronic Systolic Heart Failure, nicm   - ECHO 11/2019 EF 35-40% RV moderately reduced.  - Cath 11/2019 with minimal cardiac disease and preserved cardiac output. EF on cath 25%.  - Echo today 05/11/20: EF 55% RV ok Personally reviewed - Improved. NYHA I - Volume status ok. Can change torsemide to prn onl  - Continue carvedilol 6.25 mg twice a day.  - Continue entresto 49/51 mg twice a day  - Continue Spiro 25 mg daily - Continue Farxiga 10  2. HTN - BP low today. Will cut back torsemide  3. Obesity  Body mass index is 40.12 kg/m. Discussed portion control.  4. Suspected Sleep Apnea  -Needs sleep study but does not have insurance.  5. Hyperkalemia  - Recently elevated K.  - Recheck today  6. DMII - Followed by PCP - on Renato Battles MD 9:55 AM

## 2020-05-11 ENCOUNTER — Ambulatory Visit (HOSPITAL_BASED_OUTPATIENT_CLINIC_OR_DEPARTMENT_OTHER)
Admission: RE | Admit: 2020-05-11 | Discharge: 2020-05-11 | Disposition: A | Discharge status: Home / Self Care | Payer: Self-pay | Source: Ambulatory Visit | Attending: Internal Medicine | Admitting: Internal Medicine

## 2020-05-11 ENCOUNTER — Other Ambulatory Visit: Payer: Self-pay

## 2020-05-11 ENCOUNTER — Encounter (HOSPITAL_COMMUNITY): Payer: Self-pay | Admitting: Internal Medicine

## 2020-05-11 ENCOUNTER — Ambulatory Visit (HOSPITAL_COMMUNITY)
Admission: RE | Admit: 2020-05-11 | Discharge: 2020-05-11 | Disposition: A | Discharge status: Home / Self Care | Payer: Self-pay | Source: Ambulatory Visit | Attending: Internal Medicine | Admitting: Internal Medicine

## 2020-05-11 VITALS — BP 100/60 | HR 66 | Ht 64.5 in | Wt 237.4 lb

## 2020-05-11 DIAGNOSIS — I5042 Chronic combined systolic (congestive) and diastolic (congestive) heart failure: Secondary | ICD-10-CM | POA: Insufficient documentation

## 2020-05-11 DIAGNOSIS — Z6841 Body Mass Index (BMI) 40.0 and over, adult: Secondary | ICD-10-CM | POA: Insufficient documentation

## 2020-05-11 DIAGNOSIS — Z7984 Long term (current) use of oral hypoglycemic drugs: Secondary | ICD-10-CM | POA: Insufficient documentation

## 2020-05-11 DIAGNOSIS — I2510 Atherosclerotic heart disease of native coronary artery without angina pectoris: Secondary | ICD-10-CM | POA: Insufficient documentation

## 2020-05-11 DIAGNOSIS — Z7982 Long term (current) use of aspirin: Secondary | ICD-10-CM | POA: Insufficient documentation

## 2020-05-11 DIAGNOSIS — Z8249 Family history of ischemic heart disease and other diseases of the circulatory system: Secondary | ICD-10-CM | POA: Insufficient documentation

## 2020-05-11 DIAGNOSIS — I428 Other cardiomyopathies: Secondary | ICD-10-CM | POA: Insufficient documentation

## 2020-05-11 DIAGNOSIS — Z833 Family history of diabetes mellitus: Secondary | ICD-10-CM | POA: Insufficient documentation

## 2020-05-11 DIAGNOSIS — I110 Hypertensive heart disease with heart failure: Secondary | ICD-10-CM | POA: Insufficient documentation

## 2020-05-11 DIAGNOSIS — I5022 Chronic systolic (congestive) heart failure: Secondary | ICD-10-CM

## 2020-05-11 DIAGNOSIS — I10 Essential (primary) hypertension: Secondary | ICD-10-CM

## 2020-05-11 DIAGNOSIS — E119 Type 2 diabetes mellitus without complications: Secondary | ICD-10-CM | POA: Insufficient documentation

## 2020-05-11 DIAGNOSIS — G4733 Obstructive sleep apnea (adult) (pediatric): Secondary | ICD-10-CM | POA: Insufficient documentation

## 2020-05-11 DIAGNOSIS — Z79899 Other long term (current) drug therapy: Secondary | ICD-10-CM | POA: Insufficient documentation

## 2020-05-11 DIAGNOSIS — I272 Pulmonary hypertension, unspecified: Secondary | ICD-10-CM | POA: Insufficient documentation

## 2020-05-11 DIAGNOSIS — E875 Hyperkalemia: Secondary | ICD-10-CM | POA: Insufficient documentation

## 2020-05-11 HISTORY — DX: Heart failure, unspecified: I50.9

## 2020-05-11 HISTORY — DX: Type 2 diabetes mellitus without complications: E11.9

## 2020-05-11 LAB — BASIC METABOLIC PANEL
Anion gap: 10 (ref 5–15)
BUN: 26 mg/dL — ABNORMAL HIGH (ref 6–20)
CO2: 32 mmol/L (ref 22–32)
Calcium: 9.4 mg/dL (ref 8.9–10.3)
Chloride: 95 mmol/L — ABNORMAL LOW (ref 98–111)
Creatinine, Ser: 1.18 mg/dL — ABNORMAL HIGH (ref 0.44–1.00)
GFR, Estimated: 53 mL/min — ABNORMAL LOW (ref >60)
Glucose, Bld: 140 mg/dL — ABNORMAL HIGH (ref 70–99)
Potassium: 3.9 mmol/L (ref 3.5–5.1)
Sodium: 137 mmol/L (ref 135–145)

## 2020-05-11 LAB — ECHOCARDIOGRAM COMPLETE
Area-P 1/2: 2.32 cm2
Calc EF: 54.7 %
S' Lateral: 3 cm
Single Plane A2C EF: 50.5 %
Single Plane A4C EF: 53.4 %

## 2020-05-11 NOTE — Patient Instructions (Signed)
Labs done today, your results will be available in MyChart, we will contact you for abnormal readings.  Please call our office in March 2022 to schedule your follow up appointment  If you have any questions or concerns before your next appointment please send us a message through mychart or call our office at [phone].    TO LEAVE A MESSAGE FOR THE NURSE SELECT OPTION 2, PLEASE LEAVE A MESSAGE INCLUDING: . YOUR NAME . DATE OF BIRTH . CALL BACK NUMBER . REASON FOR CALL**this is important as we prioritize the call backs  YOU WILL RECEIVE A CALL BACK THE SAME DAY AS LONG AS YOU CALL BEFORE 4:00 PM  At the [facility], you and your health needs are our priority. As part of our continuing mission to provide you with exceptional heart care, we have created designated Provider Care Teams. These Care Teams include your primary Cardiologist (physician) and Advanced Practice Providers (APPs- Physician Assistants and Nurse Practitioners) who all work together to provide you with the care you need, when you need it.   You may see any of the following providers on your designated Care Team at your next follow up: . Dr Daniel Bensimhon . Dr Dalton McLean . Amy Clegg, NP . Brittainy Simmons, PA . Lauren Kemp, PharmD   Please be sure to bring in all your medications bottles to every appointment.    

## 2020-05-11 NOTE — Addendum Note (Signed)
Encounter addended by: Noralee Space, RN on: 05/11/2020 10:09 AM  Actions taken: Order list changed, Diagnosis association updated, Clinical Note Signed, Charge Capture section accepted

## 2020-05-11 NOTE — Progress Notes (Signed)
  Echocardiogram 2D Echocardiogram has been performed.  Tye Savoy 05/11/2020, 8:59 AM

## 2020-05-22 MED FILL — SPIRONOLACTONE 25 MG TABS: 25 | 30 days supply | Qty: 30 | Fill #4

## 2020-05-22 MED FILL — FARXIGA 10 MG TABLET: 10 | 30 days supply | Qty: 30 | Fill #4

## 2020-05-27 ENCOUNTER — Telehealth: Payer: Self-pay | Admitting: Cardiology

## 2020-06-03 ENCOUNTER — Other Ambulatory Visit (HOSPITAL_COMMUNITY): Payer: Self-pay | Admitting: Adult Health

## 2020-06-03 MED FILL — CARVEDILOL 6.25 MG TABLET: 6.25 | 30 days supply | Qty: 60 | Fill #1

## 2020-06-03 MED FILL — FARXIGA 10 MG TABLET: 10 | 30 days supply | Qty: 30 | Fill #4

## 2020-06-03 MED FILL — PANTOPRAZOLE SOD DR 40 MG T: 40 | 30 days supply | Qty: 30 | Fill #3

## 2020-06-05 ENCOUNTER — Other Ambulatory Visit (HOSPITAL_COMMUNITY): Payer: Self-pay

## 2020-06-05 ENCOUNTER — Other Ambulatory Visit (HOSPITAL_COMMUNITY): Payer: Self-pay | Admitting: Internal Medicine

## 2020-06-05 MED ORDER — TORSEMIDE 20 MG PO TABS
20.0000 mg | ORAL_TABLET | Freq: Two times a day (BID) | ORAL | 3 refills | Status: DC
Start: 2020-06-05 — End: 2020-07-13

## 2020-06-05 MED ORDER — TORSEMIDE 20 MG PO TABS
20.0000 mg | ORAL_TABLET | Freq: Two times a day (BID) | ORAL | 3 refills | Status: DC
Start: 2020-06-05 — End: 2020-06-05

## 2020-06-05 MED FILL — TORSEMIDE 20 MG TABLET: 20 | 30 days supply | Qty: 60 | Fill #0

## 2020-06-05 NOTE — Addendum Note (Signed)
Addended by: Samara Snide on: 06/05/2020 03:28 PM   Modules accepted: Orders

## 2020-06-12 NOTE — Addendum Note (Signed)
Encounter addended by: Noralee Space, RN on: 06/12/2020 4:50 PM  Actions taken: Order list changed, Diagnosis association updated

## 2020-06-15 ENCOUNTER — Ambulatory Visit (HOSPITAL_COMMUNITY)
Admission: RE | Admit: 2020-06-15 | Discharge: 2020-06-15 | Disposition: A | Discharge status: Home / Self Care | Payer: Self-pay | Source: Ambulatory Visit | Attending: Internal Medicine | Admitting: Internal Medicine

## 2020-06-15 ENCOUNTER — Other Ambulatory Visit: Payer: Self-pay

## 2020-06-15 DIAGNOSIS — I5022 Chronic systolic (congestive) heart failure: Secondary | ICD-10-CM | POA: Insufficient documentation

## 2020-06-15 LAB — BASIC METABOLIC PANEL
Anion gap: 8 (ref 5–15)
BUN: 12 mg/dL (ref 6–20)
CO2: 30 mmol/L (ref 22–32)
Calcium: 9.2 mg/dL (ref 8.9–10.3)
Chloride: 103 mmol/L (ref 98–111)
Creatinine, Ser: 0.99 mg/dL (ref 0.44–1.00)
GFR, Estimated: >60 mL/min (ref >60)
Glucose, Bld: 167 mg/dL — ABNORMAL HIGH (ref 70–99)
Potassium: 4.1 mmol/L (ref 3.5–5.1)
Sodium: 141 mmol/L (ref 135–145)

## 2020-06-15 MED FILL — SPIRONOLACTONE 25 MG TABS: 25 | 30 days supply | Qty: 30 | Fill #4

## 2020-06-16 ENCOUNTER — Encounter: Payer: Self-pay | Admitting: Cardiology

## 2020-06-16 ENCOUNTER — Telehealth (INDEPENDENT_AMBULATORY_CARE_PROVIDER_SITE_OTHER): Payer: Self-pay | Admitting: Cardiology

## 2020-06-16 VITALS — Ht 64.5 in | Wt 243.0 lb

## 2020-06-16 DIAGNOSIS — G4733 Obstructive sleep apnea (adult) (pediatric): Secondary | ICD-10-CM

## 2020-06-16 DIAGNOSIS — J309 Allergic rhinitis, unspecified: Secondary | ICD-10-CM

## 2020-06-16 DIAGNOSIS — I10 Essential (primary) hypertension: Secondary | ICD-10-CM

## 2020-06-16 NOTE — Patient Instructions (Signed)
Medication Instructions:  1) START using nasal saline spray - 2 sprays twice daily  *If you need a refill on your cardiac medications before your next appointment, please call your pharmacy*  Follow-Up: At [facility], you and your health needs are our priority.  As part of our continuing mission to provide you with exceptional heart care, we have created designated Provider Care Teams.  These Care Teams include your primary Cardiologist (physician) and Advanced Practice Providers (APPs -  Physician Assistants and Nurse Practitioners) who all work together to provide you with the care you need, when you need it.  Your next appointment:   8 week(s)  The format for your next appointment:   In Person  Provider:   Armanda Magic, MD   Other Instructions You have been referred to see an Ear, Nose, and Throat Specialist

## 2020-06-16 NOTE — Addendum Note (Signed)
Addended by: Theresia Majors on: 06/16/2020 01:34 PM   Modules accepted: Orders

## 2020-06-16 NOTE — Progress Notes (Signed)
Virtual Visit via Telephone Note   This visit type was conducted due to national recommendations for restrictions regarding the COVID-19 Pandemic (e.g. social distancing) in an effort to limit this patient's exposure and mitigate transmission in our community.  Due to her co-morbid illnesses, this patient is at least at moderate risk for complications without adequate follow up.  This format is felt to be most appropriate for this patient at this time.  The patient did not have access to video technology/had technical difficulties with video requiring transitioning to audio format only (telephone).  All issues noted in this document were discussed and addressed.  No physical exam could be performed with this format.  Please refer to the patient's chart for her  consent to telehealth for [facility].    Date:  06/16/2020   ID:  Maria Duncan, DOB Dec 17, 1965, MRN [id number] The patient was identified using 2 identifiers.  Patient Location: Home Provider Location: Home Office  PCP:  Pcp, No  Cardiologist:  Maria Miss, MD  Electrophysiologist:  None   Evaluation Performed:  Follow-Up Visit  Chief Complaint:  OSA  History of Present Illness:    Maria Duncan is a 54 y.o. female with a hx of chronic combined systolic/diastolic CHF, DM, HTN, morbid Obesity, NICM, pulmonary HTN who was referred by Maria Boozer, NP for evaluation of possible OSA.  She complained of excessive daytime sleepiness with an ESS of 9, sleep walking and sleep talking and witnessed apneas during sleep.  Her daughter and husband have remarked that she snores loudly and stops breathing in her sleep.  He underwent split night sleep study which showed severe OSA with an AHI of 99.1/hr and minimal CSA with AHI 1/hr as well as severe nocturnal hypoxemia with a nadir O2 of 51%.  He underwent CPAP titration to 17cm H2O and is now here for followup.    He is doing well with his CPAP device and thinks that he has gotten used to  it.  She tolerates the full face mask except when she has problems with allergies and colds. He had tried a nasal mask but that was not comfortable with her allergies.  She feels the pressure is adequate but would like to try to take it down some because it wakes her up in her sleep sometimes.  Since going on CPAP he feels rested in the am and has no significant daytime sleepiness.  He denies any significant mouth or nasal dryness or nasal congestion.  He does not think that he snores.    The patient does not have symptoms concerning for COVID-19 infection (fever, chills, cough, or new shortness of breath).   Past Medical History:  Diagnosis Date  . 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 Chest pain of uncertain etiology, non obstructive CAD, pain due to acute HF 12/12/2019  . CHF (congestive heart failure) (HCC)   . Diabetes mellitus without complication (HCC)   . Hypertension   . Hypoventilation associated with obesity (HCC) 12/12/2019  . Morbid obesity (HCC)   . NICM (nonischemic cardiomyopathy) (HCC)   . OSA (obstructive sleep apnea) 12/12/2019  . Pulmonary hypertension, unspecified (HCC) 12/12/2019   Past Surgical History:  Procedure Laterality Date  . ABDOMINAL HYSTERECTOMY    . RIGHT/LEFT HEART CATH AND CORONARY ANGIOGRAPHY N/A 12/09/2019   Procedure: RIGHT/LEFT HEART CATH AND CORONARY ANGIOGRAPHY;  Surgeon: Dolores Patty, MD;  Location: [facility] INVASIVE [facility];  Service: Cardiovascular;  Laterality: N/A;  Current Meds  Medication Sig  . acetaminophen (TYLENOL) 325 MG tablet Take 2 tablets (650 mg total) by mouth every 4 (four) hours as needed for headache or mild pain.  Marland Kitchen aspirin 81 MG EC tablet Take 1 tablet (81 mg total) by mouth daily.  . carvedilol (COREG) 6.25 MG tablet Take 1 tablet (6.25 mg total) by mouth 2 (two) times daily.  . cholecalciferol (VITAMIN D3) 25 MCG (1000 UNIT) tablet Take 1,000 Units by mouth daily.  . dapagliflozin propanediol  (FARXIGA) 10 MG TABS tablet Take 1 tablet (10 mg total) by mouth daily before breakfast.  . docusate sodium (COLACE) 100 MG capsule Take 1 capsule (100 mg total) by mouth 2 (two) times daily.  . pantoprazole (PROTONIX) 40 MG tablet TAKE 1 TABLET (40 MG TOTAL) BY MOUTH DAILY. (Patient taking differently: Take 40 mg by mouth daily as needed (stomach). )  . polyethylene glycol (MIRALAX / GLYCOLAX) 17 g packet Take 17 g by mouth daily as needed (constipation).  . sacubitril-valsartan (ENTRESTO) 49-51 MG Take 1 tablet by mouth 2 (two) times daily.  Marland Kitchen spironolactone (ALDACTONE) 25 MG tablet Take 1 tablet (25 mg total) by mouth daily.  Marland Kitchen torsemide (DEMADEX) 20 MG tablet Take 1 tablet (20 mg total) by mouth 2 (two) times daily. CHF (Patient taking differently: Take 20 mg by mouth daily. CHF)  . TRUEplus Lancets 28G MISC 1 each by Does not apply route in the morning and at bedtime.     Allergies:   Hyzaar [losartan potassium-hctz]   Social History   Tobacco Use  . Smoking status: Never Smoker  . Smokeless tobacco: Never Used  Substance Use Topics  . Alcohol use: No  . Drug use: No     Family Hx: The patient's family history includes Cancer in her maternal grandfather, maternal grandmother, mother, and paternal grandfather; Diabetes in her father, maternal grandfather, and paternal grandmother; Heart disease in her maternal grandfather; Hyperlipidemia in her brother, mother, and sister; Hypertension in her brother, father, maternal grandfather, paternal grandfather, paternal grandmother, and sister; Stroke in her maternal grandmother and paternal grandfather.  ROS:   Please see the history of present illness.     All other systems reviewed and are negative.   Prior CV studies:   The following studies were reviewed today:  Split night sleep study and PAP compliance download  Labs/Other Tests and Data Reviewed:    EKG:  No ECG reviewed.  Recent Labs: 12/08/2019: ALT 74; Magnesium 1.7;  Platelets 225; TSH 2.987 12/09/2019: Hemoglobin 15.3 12/19/2019: NT-Pro BNP 957 03/11/2020: B Natriuretic Peptide 67.9 06/15/2020: BUN 12; Creatinine, Ser 0.99; Potassium 4.1; Sodium 141   Recent Lipid Panel Lab Results  Component Value Date/Time   CHOL 204 (H) 12/08/2019 04:35 AM   TRIG 82 12/08/2019 04:35 AM   HDL 65 12/08/2019 04:35 AM   CHOLHDL 3.1 12/08/2019 04:35 AM   LDLCALC 123 (H) 12/08/2019 04:35 AM    Wt Readings from Last 3 Encounters:  06/16/20 243 lb (110.2 kg)  05/11/20 237 lb 6.4 oz (107.7 kg)  04/03/20 232 lb (105.2 kg)     Risk Assessment/Calculations:      Objective:    Vital Signs:  Ht 5' 4.5" (1.638 m)   Wt 243 lb (110.2 kg)   BMI 41.07 kg/m      ASSESSMENT & PLAN:    OSA - The pathophysiology of obstructive sleep apnea , it's cardiovascular consequences & modes of treatment including CPAP were discused with the patient in  detail & they evidenced understanding.  The patient is tolerating PAP therapy well without any problems. The PAP download was reviewed today and showed an AHI of 1.9/hr on 17 cm H2O with 47% compliance in using more than 4 hours nightly.  The patient has been using and benefiting from PAP use and will continue to benefit from therapy.  -she would like the pressure lowered some so I will decrease her CPAP to 15cm H2O to see if that helps -repeat download in 4 weeks -followup with me in office in 8 weeks -she is having problems with significant allergic rhinitis so I will refer her to ENT -I encouraged her to get some nasal saline spray  And use 2 sprays in each nostril BID  2.  HTN -continue Carvedilol 6.25mg  BID, spiro 20mg  daily and Entresto 49-51mg  BID  3.  Morbid Obesity -I have encouraged him to get into a routine exercise program and cut back on carbs and portions.    Shared Decision Making/Informed Consent       COVID-19 Education: The signs and symptoms of COVID-19 were discussed with the patient and how to seek care  for testing (follow up with PCP or arrange E-visit).  The importance of social distancing was discussed today.  Time:   Today, I have spent 20 minutes with the patient with telehealth technology discussing the above problems.     Medication Adjustments/Labs and Tests Ordered: Current medicines are reviewed at length with the patient today.  Concerns regarding medicines are outlined above.   Tests Ordered: No orders of the defined types were placed in this encounter.   Medication Changes: No orders of the defined types were placed in this encounter.   Follow Up:  In Person in 8 week(s)    Signed, , MD  06/16/2020 1:26 PM    [facility]

## 2020-06-23 ENCOUNTER — Telehealth: Payer: Self-pay | Admitting: *Deleted

## 2020-06-23 DIAGNOSIS — G4733 Obstructive sleep apnea (adult) (pediatric): Secondary | ICD-10-CM

## 2020-06-23 NOTE — Telephone Encounter (Signed)
Quintella Reichert, MD  Reesa Chew, CMA Please get an overnight pulse ox on CPAP

## 2020-06-23 NOTE — Telephone Encounter (Signed)
-----   Message from Quintella Reichert, MD sent at 06/16/2020  1:29 PM EST ----- Please decrease CPAP to 15cm H2O and get a download in 4 weeks

## 2020-06-23 NOTE — Telephone Encounter (Signed)
Orders placed to [facility] via community message. 

## 2020-07-13 ENCOUNTER — Other Ambulatory Visit (HOSPITAL_COMMUNITY): Payer: Self-pay | Admitting: Adult Health

## 2020-07-13 MED FILL — FARXIGA 10 MG TABLET: 10 | 30 days supply | Qty: 30 | Fill #5

## 2020-07-13 MED FILL — SPIRONOLACTONE 25 MG TABS: 25 | 30 days supply | Qty: 30 | Fill #5

## 2020-07-13 MED FILL — CARVEDILOL 6.25 MG TABLET: 6.25 | 30 days supply | Qty: 60 | Fill #2

## 2020-07-13 MED FILL — TORSEMIDE 20 MG TABLET: 20 | 30 days supply | Qty: 60 | Fill #1

## 2020-07-21 ENCOUNTER — Other Ambulatory Visit: Payer: Self-pay

## 2020-07-21 DIAGNOSIS — Z20822 Contact with and (suspected) exposure to covid-19: Secondary | ICD-10-CM

## 2020-07-22 LAB — NOVEL CORONAVIRUS, NAA: SARS-CoV-2, NAA: DETECTED — AB

## 2020-07-22 LAB — SARS-COV-2, NAA 2 DAY TAT

## 2020-07-23 ENCOUNTER — Emergency Department (HOSPITAL_COMMUNITY): Payer: HRSA Program

## 2020-07-23 ENCOUNTER — Emergency Department (HOSPITAL_COMMUNITY)
Admission: EM | Admit: 2020-07-23 | Discharge: 2020-07-23 | Disposition: A | Discharge status: Home / Self Care | Payer: HRSA Program | Attending: Emergency Medicine | Admitting: Emergency Medicine

## 2020-07-23 ENCOUNTER — Encounter (HOSPITAL_COMMUNITY): Payer: Self-pay | Admitting: Emergency Medicine

## 2020-07-23 DIAGNOSIS — I2510 Atherosclerotic heart disease of native coronary artery without angina pectoris: Secondary | ICD-10-CM | POA: Insufficient documentation

## 2020-07-23 DIAGNOSIS — E119 Type 2 diabetes mellitus without complications: Secondary | ICD-10-CM | POA: Insufficient documentation

## 2020-07-23 DIAGNOSIS — I272 Pulmonary hypertension, unspecified: Secondary | ICD-10-CM | POA: Diagnosis not present

## 2020-07-23 DIAGNOSIS — R531 Weakness: Secondary | ICD-10-CM | POA: Diagnosis present

## 2020-07-23 DIAGNOSIS — I5043 Acute on chronic combined systolic (congestive) and diastolic (congestive) heart failure: Secondary | ICD-10-CM | POA: Diagnosis not present

## 2020-07-23 DIAGNOSIS — Z7982 Long term (current) use of aspirin: Secondary | ICD-10-CM | POA: Insufficient documentation

## 2020-07-23 DIAGNOSIS — I110 Hypertensive heart disease with heart failure: Secondary | ICD-10-CM | POA: Diagnosis not present

## 2020-07-23 DIAGNOSIS — U071 COVID-19: Secondary | ICD-10-CM | POA: Diagnosis not present

## 2020-07-23 LAB — COMPREHENSIVE METABOLIC PANEL
ALT: 50 U/L — ABNORMAL HIGH (ref 0–44)
AST: 77 U/L — ABNORMAL HIGH (ref 15–41)
Albumin: 3.1 g/dL — ABNORMAL LOW (ref 3.5–5.0)
Alkaline Phosphatase: 58 U/L (ref 38–126)
Anion gap: 13 (ref 5–15)
BUN: 14 mg/dL (ref 6–20)
CO2: 24 mmol/L (ref 22–32)
Calcium: 8.7 mg/dL — ABNORMAL LOW (ref 8.9–10.3)
Chloride: 92 mmol/L — ABNORMAL LOW (ref 98–111)
Creatinine, Ser: 1.34 mg/dL — ABNORMAL HIGH (ref 0.44–1.00)
GFR, Estimated: 47 mL/min — ABNORMAL LOW (ref >60)
Glucose, Bld: 134 mg/dL — ABNORMAL HIGH (ref 70–99)
Potassium: 4.3 mmol/L (ref 3.5–5.1)
Sodium: 129 mmol/L — ABNORMAL LOW (ref 135–145)
Total Bilirubin: 0.7 mg/dL (ref 0.3–1.2)
Total Protein: 7.3 g/dL (ref 6.5–8.1)

## 2020-07-23 LAB — I-STAT BETA HCG BLOOD, ED (MC, WL, AP ONLY): I-stat hCG, quantitative: <5 m[IU]/mL (ref <5)

## 2020-07-23 LAB — CBC WITH DIFFERENTIAL/PLATELET
Abs Immature Granulocytes: 0.03 10*3/uL (ref 0.00–0.07)
Basophils Absolute: 0 10*3/uL (ref 0.0–0.1)
Basophils Relative: 0 %
Eosinophils Absolute: 0 10*3/uL (ref 0.0–0.5)
Eosinophils Relative: 0 %
HCT: 38 % (ref 36.0–46.0)
Hemoglobin: 12.8 g/dL (ref 12.0–15.0)
Immature Granulocytes: 0 %
Lymphocytes Relative: 18 %
Lymphs Abs: 1.5 10*3/uL (ref 0.7–4.0)
MCH: 29.4 pg (ref 26.0–34.0)
MCHC: 33.7 g/dL (ref 30.0–36.0)
MCV: 87.4 fL (ref 80.0–100.0)
Monocytes Absolute: 0.3 10*3/uL (ref 0.1–1.0)
Monocytes Relative: 3 %
Neutro Abs: 6.4 10*3/uL (ref 1.7–7.7)
Neutrophils Relative %: 79 %
Platelets: 101 10*3/uL — ABNORMAL LOW (ref 150–400)
RBC: 4.35 MIL/uL (ref 3.87–5.11)
RDW: 11.9 % (ref 11.5–15.5)
WBC: 8.2 10*3/uL (ref 4.0–10.5)
nRBC: 0 % (ref 0.0–0.2)

## 2020-07-23 MED ORDER — ACETAMINOPHEN 325 MG PO TABS
650.0000 mg | ORAL_TABLET | Freq: Once | ORAL | Status: AC | PRN
  Administered 2020-07-23: 650 mg via ORAL
  Filled 2020-07-23: qty 2

## 2020-07-23 MED ORDER — SODIUM CHLORIDE 0.9 % IV BOLUS: 500.0000 mL | Freq: Once | INTRAVENOUS | Status: DC

## 2020-07-23 NOTE — ED Triage Notes (Signed)
Pt diagnosed with covid 3 days ago, reports n/v/d now and thinks she may be dehydrated, sent by pcp for further eval. resp e/u, nad.

## 2020-07-23 NOTE — Discharge Instructions (Addendum)
Call the Infusion Center tomorrow morning after 8AM at [phone].  They will arrange a time to give you Monoclonal antibody infusion for COVID.  Continue isolation at home until 5 days after your symptoms have all resolved.  Return to the ER if you have difficulty breathing, worsening symptoms or any other concerns.

## 2020-07-23 NOTE — ED Provider Notes (Signed)
[facility] EMERGENCY DEPARTMENT Provider Note   CSN: [id number] Arrival date & time: 07/23/20  1136     History Chief Complaint  Patient presents with  . Covid Positive    Maria Duncan is a 54 y.o. female.  Patient presents with chief complaint of generalized weakness nausea cough fever and body aches.  She found she was Covid +3 days ago.  She has had symptoms since December 25.  She denies any diarrhea.  She was referred to the antibiotic infusion center, and told to go to the ER for evaluation.        Past Medical History:  Diagnosis Date  . 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 Chest pain of uncertain etiology, non obstructive CAD, pain due to acute HF 12/12/2019  . CHF (congestive heart failure) (HCC)   . Diabetes mellitus without complication (HCC)   . Hypertension   . Hypoventilation associated with obesity (HCC) 12/12/2019  . Morbid obesity (HCC)   . NICM (nonischemic cardiomyopathy) (HCC)   . OSA (obstructive sleep apnea) 12/12/2019  . Pulmonary hypertension, unspecified (HCC) 12/12/2019    Patient Active Problem List   Diagnosis Date Noted  . Acute on chronic combined systolic and diastolic CHF (congestive heart failure) (HCC) 01/24/2020  . Chest pain of uncertain etiology, non obstructive CAD, pain due to acute HF 12/12/2019  . Pulmonary hypertension, unspecified (HCC) 12/12/2019  . OSA (obstructive sleep apnea) 12/12/2019  . Hypoventilation associated with obesity (HCC) 12/12/2019  . CAD in native artery non obstructive on cath 12/09/19 12/12/2019  . NICM (nonischemic cardiomyopathy) (HCC)   . 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 Morbid obesity (HCC)   . CHF exacerbation (HCC) 12/07/2019  . Cardiomegaly 12/07/2019  . Essential hypertension 12/07/2019  . CHF (congestive heart failure) (HCC) 12/07/2019    Past Surgical History:  Procedure Laterality Date  . ABDOMINAL  HYSTERECTOMY    . RIGHT/LEFT HEART CATH AND CORONARY ANGIOGRAPHY N/A 12/09/2019   Procedure: RIGHT/LEFT HEART CATH AND CORONARY ANGIOGRAPHY;  Surgeon: Dolores Patty, MD;  Location: [facility] INVASIVE CV LAB;  Service: Cardiovascular;  Laterality: N/A;     OB History   No obstetric history on file.     Family History  Problem Relation Age of Onset  . Cancer Mother   . Hyperlipidemia Mother   . Diabetes Father   . Hypertension Father   . Hyperlipidemia Sister   . Hypertension Sister   . Hyperlipidemia Brother   . Hypertension Brother   . Cancer Maternal Grandmother   . Stroke Maternal Grandmother   . Cancer Maternal Grandfather   . Diabetes Maternal Grandfather   . Heart disease Maternal Grandfather   . Hypertension Maternal Grandfather   . Diabetes Paternal Grandmother   . Hypertension Paternal Grandmother   . Cancer Paternal Grandfather   . Hypertension Paternal Grandfather   . Stroke Paternal Grandfather     Social History   Tobacco Use  . Smoking status: Never Smoker  . Smokeless tobacco: Never Used  Substance Use Topics  . Alcohol use: No  . Drug use: No    Home Medications Prior to Admission medications   Medication Sig Start Date End Date Taking? Authorizing Provider  acetaminophen (TYLENOL) 325 MG tablet Take 2 tablets (650 mg total) by mouth every 4 (four) hours as needed for headache or mild pain. 12/12/19   Leone Brand, NP  aspirin 81 MG EC tablet Take  1 tablet (81 mg total) by mouth daily. 12/20/19   Duke Salvia, MD  carvedilol (COREG) 6.25 MG tablet Take 1 tablet (6.25 mg total) by mouth 2 (two) times daily. 04/23/20   Bensimhon, Bevelyn Buckles, MD  cholecalciferol (VITAMIN D3) 25 MCG (1000 UNIT) tablet Take 1,000 Units by mouth daily.    [provider]  dapagliflozin propanediol (FARXIGA) 10 MG TABS tablet Take 1 tablet (10 mg total) by mouth daily before breakfast. 01/14/20   Bensimhon, Bevelyn Buckles, MD  docusate sodium (COLACE) 100 MG capsule Take 1  capsule (100 mg total) by mouth 2 (two) times daily. 12/12/19   Leone Brand, NP  pantoprazole (PROTONIX) 40 MG tablet TAKE 1 TABLET (40 MG TOTAL) BY MOUTH DAILY. Patient taking differently: Take 40 mg by mouth daily as needed (stomach).  02/19/20   Nahser, Deloris Ping, MD  polyethylene glycol (MIRALAX / GLYCOLAX) 17 g packet Take 17 g by mouth daily as needed (constipation). 12/12/19   Leone Brand, NP  sacubitril-valsartan (ENTRESTO) 49-51 MG Take 1 tablet by mouth 2 (two) times daily. 03/11/20   Bensimhon, Bevelyn Buckles, MD  spironolactone (ALDACTONE) 25 MG tablet Take 1 tablet (25 mg total) by mouth daily. 12/20/19   Duke Salvia, MD  torsemide (DEMADEX) 20 MG tablet TAKE 1 TABLET (20 MG TOTAL) BY MOUTH 2 (TWO) TIMES DAILY. 07/13/20   Bensimhon, Bevelyn Buckles, MD  TRUEplus Lancets 28G MISC 1 each by Does not apply route in the morning and at bedtime. 12/25/19   Anders Simmonds, PA-C    Allergies    Hyzaar [losartan potassium-hctz]  Review of Systems   Review of Systems  Constitutional: Positive for fever.  HENT: Negative for ear pain.   Eyes: Negative for pain.  Respiratory: Positive for cough.   Cardiovascular: Negative for chest pain.  Gastrointestinal: Negative for abdominal pain.  Genitourinary: Negative for flank pain.  Musculoskeletal: Negative for back pain.  Skin: Negative for rash.  Neurological: Negative for headaches.    Physical Exam Updated Vital Signs BP 127/74 (BP Location: Right Arm)   Pulse 94   Temp 99.1 F (37.3 C) (Oral)   Resp 17   Ht 5\' 5"  (1.651 m)   Wt 108 kg   SpO2 92%   BMI 39.61 kg/m   Physical Exam Constitutional:      General: She is not in acute distress.    Appearance: Normal appearance.  HENT:     Head: Normocephalic.     Nose: Nose normal.  Eyes:     Extraocular Movements: Extraocular movements intact.  Cardiovascular:     Rate and Rhythm: Normal rate.  Pulmonary:     Effort: Pulmonary effort is normal.  Abdominal:     Palpations:  Abdomen is soft.     Tenderness: There is no abdominal tenderness.  Musculoskeletal:        General: Normal range of motion.     Cervical back: Normal range of motion.  Neurological:     General: No focal deficit present.     Mental Status: She is alert.     ED Results / Procedures / Treatments   Labs (all labs ordered are listed, but only abnormal results are displayed) Labs Reviewed  COMPREHENSIVE METABOLIC PANEL - Abnormal; Notable for the following components:      Result Value   Sodium 129 (*)    Chloride 92 (*)    Glucose, Bld 134 (*)    Creatinine, Ser 1.34 (*)  Calcium 8.7 (*)    Albumin 3.1 (*)    AST 77 (*)    ALT 50 (*)    GFR, Estimated 47 (*)    All other components within normal limits  CBC WITH DIFFERENTIAL/PLATELET - Abnormal; Notable for the following components:   Platelets 101 (*)    All other components within normal limits  I-STAT BETA HCG BLOOD, ED ([facility], [facility], [facility] ONLY)    EKG None  Radiology DG Chest Portable 1 View  Result Date: 07/23/2020 CLINICAL DATA:  Fever and body aches.  Reported COVID-19 positive EXAM: PORTABLE CHEST 1 VIEW COMPARISON:  Dec 07, 2019 chest radiograph and chest CT FINDINGS: There is mild atelectatic change in the right mid lung and bibasilar regions. Lungs otherwise are clear. Heart is mildly enlarged with pulmonary vascularity normal. No adenopathy. No bone lesions. IMPRESSION: Areas of mild atelectatic change bilaterally. Earliest changes of atypical organism pneumonia could present in this manner. No well-defined airspace opacity, however. Stable cardiac prominence. No adenopathy. Electronically Signed   By: Bretta Bang III M.D.   On: 07/23/2020 12:28    Procedures Procedures (including critical care time)  Medications Ordered in ED Medications  acetaminophen (TYLENOL) tablet 650 mg (650 mg Oral Given 07/23/20 1204)    ED Course  I have reviewed the triage vital signs and the nursing notes.  Pertinent labs &  imaging results that were available during my care of the patient were reviewed by me and considered in my medical decision making (see chart for details).    MDM Rules/Calculators/A&P                          Patient presents on day 6 of Covid symptoms.  She has a history of congestive heart failure, obesity BMI greater than 49.  Case discussed with infusion center, they will have the patient come in for monoclonal antibody infusion tomorrow.  Patient agreeable with plan risks and benefits discussed.  O2 saturation ranging from 92 to 94% here in the ER on room air.  Advised her to return immediately if she has worsening breathing problems, pain or any additional concerns.  Final Clinical Impression(s) / ED Diagnoses Final diagnoses:  COVID-19 virus infection    Rx / DC Orders ED Discharge Orders    None       Cheryll Cockayne, MD 07/23/20 1651

## 2020-07-24 ENCOUNTER — Ambulatory Visit (HOSPITAL_COMMUNITY): Payer: Self-pay | Attending: Pulmonary Disease

## 2020-07-27 ENCOUNTER — Inpatient Hospital Stay (HOSPITAL_COMMUNITY): Payer: HRSA Program

## 2020-07-27 ENCOUNTER — Emergency Department (HOSPITAL_COMMUNITY): Payer: HRSA Program

## 2020-07-27 ENCOUNTER — Inpatient Hospital Stay (HOSPITAL_COMMUNITY)
Admission: EM | Admit: 2020-07-27 | Discharge: 2020-08-07 | DRG: 177 | Disposition: A | Discharge status: Home / Self Care | Payer: HRSA Program | Attending: Internal Medicine | Admitting: Internal Medicine

## 2020-07-27 DIAGNOSIS — Z6838 Body mass index (BMI) 38.0-38.9, adult: Secondary | ICD-10-CM

## 2020-07-27 DIAGNOSIS — Z79899 Other long term (current) drug therapy: Secondary | ICD-10-CM

## 2020-07-27 DIAGNOSIS — R7989 Other specified abnormal findings of blood chemistry: Secondary | ICD-10-CM

## 2020-07-27 DIAGNOSIS — I2510 Atherosclerotic heart disease of native coronary artery without angina pectoris: Secondary | ICD-10-CM | POA: Diagnosis present

## 2020-07-27 DIAGNOSIS — J1282 Pneumonia due to coronavirus disease 2019: Secondary | ICD-10-CM | POA: Diagnosis present

## 2020-07-27 DIAGNOSIS — Z9049 Acquired absence of other specified parts of digestive tract: Secondary | ICD-10-CM | POA: Diagnosis not present

## 2020-07-27 DIAGNOSIS — Z7982 Long term (current) use of aspirin: Secondary | ICD-10-CM | POA: Diagnosis not present

## 2020-07-27 DIAGNOSIS — E662 Morbid (severe) obesity with alveolar hypoventilation: Secondary | ICD-10-CM | POA: Diagnosis present

## 2020-07-27 DIAGNOSIS — Y92239 Unspecified place in hospital as the place of occurrence of the external cause: Secondary | ICD-10-CM | POA: Diagnosis not present

## 2020-07-27 DIAGNOSIS — J9601 Acute respiratory failure with hypoxia: Secondary | ICD-10-CM | POA: Diagnosis present

## 2020-07-27 DIAGNOSIS — T380X5A Adverse effect of glucocorticoids and synthetic analogues, initial encounter: Secondary | ICD-10-CM | POA: Diagnosis not present

## 2020-07-27 DIAGNOSIS — I5042 Chronic combined systolic (congestive) and diastolic (congestive) heart failure: Secondary | ICD-10-CM | POA: Diagnosis present

## 2020-07-27 DIAGNOSIS — G4733 Obstructive sleep apnea (adult) (pediatric): Secondary | ICD-10-CM | POA: Diagnosis present

## 2020-07-27 DIAGNOSIS — E1165 Type 2 diabetes mellitus with hyperglycemia: Secondary | ICD-10-CM | POA: Diagnosis not present

## 2020-07-27 DIAGNOSIS — I5040 Unspecified combined systolic (congestive) and diastolic (congestive) heart failure: Secondary | ICD-10-CM | POA: Diagnosis present

## 2020-07-27 DIAGNOSIS — K5900 Constipation, unspecified: Secondary | ICD-10-CM | POA: Diagnosis not present

## 2020-07-27 DIAGNOSIS — I110 Hypertensive heart disease with heart failure: Secondary | ICD-10-CM | POA: Diagnosis present

## 2020-07-27 DIAGNOSIS — R197 Diarrhea, unspecified: Secondary | ICD-10-CM | POA: Diagnosis present

## 2020-07-27 DIAGNOSIS — Z833 Family history of diabetes mellitus: Secondary | ICD-10-CM | POA: Diagnosis not present

## 2020-07-27 DIAGNOSIS — U071 COVID-19: Principal | ICD-10-CM | POA: Diagnosis present

## 2020-07-27 DIAGNOSIS — Z8249 Family history of ischemic heart disease and other diseases of the circulatory system: Secondary | ICD-10-CM | POA: Diagnosis not present

## 2020-07-27 DIAGNOSIS — I272 Pulmonary hypertension, unspecified: Secondary | ICD-10-CM | POA: Diagnosis present

## 2020-07-27 DIAGNOSIS — I10 Essential (primary) hypertension: Secondary | ICD-10-CM | POA: Diagnosis present

## 2020-07-27 DIAGNOSIS — N179 Acute kidney failure, unspecified: Secondary | ICD-10-CM | POA: Diagnosis present

## 2020-07-27 DIAGNOSIS — I5043 Acute on chronic combined systolic (congestive) and diastolic (congestive) heart failure: Secondary | ICD-10-CM | POA: Diagnosis not present

## 2020-07-27 DIAGNOSIS — I428 Other cardiomyopathies: Secondary | ICD-10-CM | POA: Diagnosis present

## 2020-07-27 DIAGNOSIS — Z888 Allergy status to other drugs, medicaments and biological substances status: Secondary | ICD-10-CM

## 2020-07-27 DIAGNOSIS — R739 Hyperglycemia, unspecified: Secondary | ICD-10-CM

## 2020-07-27 LAB — COMPREHENSIVE METABOLIC PANEL
ALT: 138 U/L — ABNORMAL HIGH (ref 0–44)
AST: 199 U/L — ABNORMAL HIGH (ref 15–41)
Albumin: 2.5 g/dL — ABNORMAL LOW (ref 3.5–5.0)
Alkaline Phosphatase: 100 U/L (ref 38–126)
Anion gap: 13 (ref 5–15)
BUN: 40 mg/dL — ABNORMAL HIGH (ref 6–20)
CO2: 25 mmol/L (ref 22–32)
Calcium: 8.8 mg/dL — ABNORMAL LOW (ref 8.9–10.3)
Chloride: 93 mmol/L — ABNORMAL LOW (ref 98–111)
Creatinine, Ser: 2.04 mg/dL — ABNORMAL HIGH (ref 0.44–1.00)
GFR, Estimated: 28 mL/min — ABNORMAL LOW (ref >60)
Glucose, Bld: 139 mg/dL — ABNORMAL HIGH (ref 70–99)
Potassium: 5 mmol/L (ref 3.5–5.1)
Sodium: 131 mmol/L — ABNORMAL LOW (ref 135–145)
Total Bilirubin: 1 mg/dL (ref 0.3–1.2)
Total Protein: 7.2 g/dL (ref 6.5–8.1)

## 2020-07-27 LAB — CBC WITH DIFFERENTIAL/PLATELET
Abs Immature Granulocytes: 0.24 10*3/uL — ABNORMAL HIGH (ref 0.00–0.07)
Basophils Absolute: 0 10*3/uL (ref 0.0–0.1)
Basophils Relative: 0 %
Eosinophils Absolute: 0 10*3/uL (ref 0.0–0.5)
Eosinophils Relative: 0 %
HCT: 37.3 % (ref 36.0–46.0)
Hemoglobin: 12.5 g/dL (ref 12.0–15.0)
Immature Granulocytes: 2 %
Lymphocytes Relative: 17 %
Lymphs Abs: 1.8 10*3/uL (ref 0.7–4.0)
MCH: 29 pg (ref 26.0–34.0)
MCHC: 33.5 g/dL (ref 30.0–36.0)
MCV: 86.5 fL (ref 80.0–100.0)
Monocytes Absolute: 0.5 10*3/uL (ref 0.1–1.0)
Monocytes Relative: 5 %
Neutro Abs: 8.4 10*3/uL — ABNORMAL HIGH (ref 1.7–7.7)
Neutrophils Relative %: 76 %
Platelets: 221 10*3/uL (ref 150–400)
RBC: 4.31 MIL/uL (ref 3.87–5.11)
RDW: 13 % (ref 11.5–15.5)
WBC: 11 10*3/uL — ABNORMAL HIGH (ref 4.0–10.5)
nRBC: 0.2 % (ref 0.0–0.2)

## 2020-07-27 LAB — LACTIC ACID, PLASMA
Lactic Acid, Venous: 1.6 mmol/L (ref 0.5–1.9)
Lactic Acid, Venous: 1.1 mmol/L (ref 0.5–1.9)

## 2020-07-27 LAB — LACTATE DEHYDROGENASE: LDH: 780 U/L — ABNORMAL HIGH (ref 98–192)

## 2020-07-27 LAB — CBG MONITORING, ED
Glucose-Capillary: 192 mg/dL — ABNORMAL HIGH (ref 70–99)
Glucose-Capillary: 290 mg/dL — ABNORMAL HIGH (ref 70–99)
Glucose-Capillary: 296 mg/dL — ABNORMAL HIGH (ref 70–99)
Glucose-Capillary: 226 mg/dL — ABNORMAL HIGH (ref 70–99)

## 2020-07-27 LAB — FIBRINOGEN: Fibrinogen: 727 mg/dL — ABNORMAL HIGH (ref 210–475)

## 2020-07-27 LAB — PROCALCITONIN: Procalcitonin: 0.83 ng/mL

## 2020-07-27 LAB — BRAIN NATRIURETIC PEPTIDE: B Natriuretic Peptide: 420.3 pg/mL — ABNORMAL HIGH (ref 0.0–100.0)

## 2020-07-27 LAB — C-REACTIVE PROTEIN: CRP: 23.9 mg/dL — ABNORMAL HIGH (ref <1.0)

## 2020-07-27 LAB — TRIGLYCERIDES: Triglycerides: 144 mg/dL (ref <150)

## 2020-07-27 LAB — I-STAT BETA HCG BLOOD, ED (MC, WL, AP ONLY): I-stat hCG, quantitative: <5 m[IU]/mL (ref <5)

## 2020-07-27 LAB — FERRITIN: Ferritin: 4546 ng/mL — ABNORMAL HIGH (ref 11–307)

## 2020-07-27 LAB — D-DIMER, QUANTITATIVE: D-Dimer, Quant: 3.89 ug/mL-FEU — ABNORMAL HIGH (ref 0.00–0.50)

## 2020-07-27 MED ORDER — PANTOPRAZOLE SODIUM 40 MG PO TBEC
40.0000 mg | DELAYED_RELEASE_TABLET | Freq: Every day | ORAL | Status: DC | PRN
Start: 2020-07-27 — End: 2020-07-30

## 2020-07-27 MED ORDER — SODIUM CHLORIDE 0.9 % IV SOLN
1.0000 g | INTRAVENOUS | Status: DC
  Administered 2020-07-27: 1 g via INTRAVENOUS
  Filled 2020-07-27: qty 10

## 2020-07-27 MED ORDER — SODIUM CHLORIDE 0.9 % IV SOLN
500.0000 mg | INTRAVENOUS | Status: AC
  Administered 2020-07-28 – 2020-07-31 (×4): 500 mg via INTRAVENOUS
  Filled 2020-07-27 (×4): qty 500

## 2020-07-27 MED ORDER — GUAIFENESIN-DM 100-10 MG/5ML PO SYRP: 10.0000 mL | ORAL_SOLUTION | ORAL | Status: DC | PRN

## 2020-07-27 MED ORDER — TORSEMIDE 20 MG PO TABS: 20.0000 mg | ORAL_TABLET | Freq: Two times a day (BID) | ORAL | Status: DC

## 2020-07-27 MED ORDER — OXYCODONE HCL 5 MG PO TABS
5.0000 mg | ORAL_TABLET | Freq: Four times a day (QID) | ORAL | Status: AC | PRN
  Administered 2020-07-28 – 2020-07-29 (×3): 5 mg via ORAL
  Filled 2020-07-27 (×3): qty 1

## 2020-07-27 MED ORDER — SODIUM CHLORIDE 0.9 % IV SOLN: INTRAVENOUS | Status: DC

## 2020-07-27 MED ORDER — PREDNISONE 20 MG PO TABS: 50.0000 mg | ORAL_TABLET | Freq: Every day | ORAL | Status: DC

## 2020-07-27 MED ORDER — CARVEDILOL 6.25 MG PO TABS
6.2500 mg | ORAL_TABLET | Freq: Two times a day (BID) | ORAL | Status: DC
  Administered 2020-07-29 – 2020-08-07 (×20): 6.25 mg via ORAL
  Filled 2020-07-27 (×5): qty 1
  Filled 2020-07-27: qty 2
  Filled 2020-07-27 (×11): qty 1
  Filled 2020-07-27: qty 2
  Filled 2020-07-27 (×4): qty 1

## 2020-07-27 MED ORDER — SODIUM CHLORIDE 0.9 % IV SOLN
200.0000 mg | Freq: Once | INTRAVENOUS | Status: AC
  Administered 2020-07-27: 200 mg via INTRAVENOUS
  Filled 2020-07-27: qty 40

## 2020-07-27 MED ORDER — POLYETHYLENE GLYCOL 3350 17 G PO PACK
17.0000 g | PACK | Freq: Every day | ORAL | Status: AC | PRN
  Administered 2020-07-28: 17 g via ORAL
  Filled 2020-07-27: qty 1

## 2020-07-27 MED ORDER — ONDANSETRON HCL 4 MG/2ML IJ SOLN
4.0000 mg | Freq: Four times a day (QID) | INTRAMUSCULAR | Status: DC | PRN
  Administered 2020-07-30: 4 mg via INTRAVENOUS
  Filled 2020-07-27: qty 2

## 2020-07-27 MED ORDER — ENOXAPARIN SODIUM 40 MG/0.4ML ~~LOC~~ SOLN: 40.0000 mg | SUBCUTANEOUS | Status: DC

## 2020-07-27 MED ORDER — HYDROCOD POLST-CPM POLST ER 10-8 MG/5ML PO SUER
5.0000 mL | Freq: Two times a day (BID) | ORAL | Status: DC | PRN
  Administered 2020-08-02: 5 mL via ORAL
  Filled 2020-07-27: qty 5

## 2020-07-27 MED ORDER — ONDANSETRON HCL 4 MG PO TABS: 4.0000 mg | ORAL_TABLET | Freq: Four times a day (QID) | ORAL | Status: DC | PRN

## 2020-07-27 MED ORDER — INSULIN ASPART 100 UNIT/ML ~~LOC~~ SOLN
0.0000 [IU] | Freq: Three times a day (TID) | SUBCUTANEOUS | Status: DC
  Administered 2020-07-27 (×2): 8 [IU] via SUBCUTANEOUS
  Administered 2020-07-28: 5 [IU] via SUBCUTANEOUS
  Administered 2020-07-28: 11 [IU] via SUBCUTANEOUS

## 2020-07-27 MED ORDER — INSULIN ASPART 100 UNIT/ML ~~LOC~~ SOLN
0.0000 [IU] | Freq: Every day | SUBCUTANEOUS | Status: DC
  Administered 2020-07-28: 2 [IU] via SUBCUTANEOUS

## 2020-07-27 MED ORDER — SPIRONOLACTONE 25 MG PO TABS
25.0000 mg | ORAL_TABLET | Freq: Every day | ORAL | Status: DC
  Filled 2020-07-27: qty 1

## 2020-07-27 MED ORDER — METHYLPREDNISOLONE SODIUM SUCC 125 MG IJ SOLR
1.0000 mg/kg | Freq: Two times a day (BID) | INTRAMUSCULAR | Status: DC
  Administered 2020-07-27: 108.125 mg via INTRAVENOUS
  Filled 2020-07-27: qty 2

## 2020-07-27 MED ORDER — ACETAMINOPHEN 325 MG PO TABS
325.0000 mg | ORAL_TABLET | Freq: Four times a day (QID) | ORAL | Status: DC | PRN
  Administered 2020-07-27 – 2020-08-02 (×10): 325 mg via ORAL
  Filled 2020-07-27 (×10): qty 1

## 2020-07-27 MED ORDER — DAPAGLIFLOZIN PROPANEDIOL 10 MG PO TABS
10.0000 mg | ORAL_TABLET | Freq: Every day | ORAL | Status: DC
  Administered 2020-07-27: 10 mg via ORAL
  Filled 2020-07-27: qty 1

## 2020-07-27 MED ORDER — DEXAMETHASONE SODIUM PHOSPHATE 10 MG/ML IJ SOLN
10.0000 mg | Freq: Once | INTRAMUSCULAR | Status: AC
  Administered 2020-07-27: 10 mg via INTRAVENOUS
  Filled 2020-07-27: qty 1

## 2020-07-27 MED ORDER — SENNA 8.6 MG PO TABS
1.0000 | ORAL_TABLET | Freq: Every day | ORAL | Status: DC
  Administered 2020-07-28 – 2020-08-07 (×10): 8.6 mg via ORAL
  Filled 2020-07-27 (×10): qty 1

## 2020-07-27 MED ORDER — SACUBITRIL-VALSARTAN 49-51 MG PO TABS
1.0000 | ORAL_TABLET | Freq: Two times a day (BID) | ORAL | Status: DC
  Filled 2020-07-27: qty 1

## 2020-07-27 MED ORDER — TOCILIZUMAB 400 MG/20ML IV SOLN
800.0000 mg | Freq: Once | INTRAVENOUS | Status: AC
  Administered 2020-07-27: 800 mg via INTRAVENOUS
  Filled 2020-07-27: qty 40

## 2020-07-27 MED ORDER — ASCORBIC ACID 500 MG PO TABS
500.0000 mg | ORAL_TABLET | Freq: Every day | ORAL | Status: DC
  Administered 2020-07-27 – 2020-08-01 (×6): 500 mg via ORAL
  Filled 2020-07-27 (×6): qty 1

## 2020-07-27 MED ORDER — ASPIRIN EC 81 MG PO TBEC
81.0000 mg | DELAYED_RELEASE_TABLET | Freq: Every day | ORAL | Status: DC
  Administered 2020-07-27 – 2020-08-07 (×12): 81 mg via ORAL
  Filled 2020-07-27 (×12): qty 1

## 2020-07-27 MED ORDER — ENOXAPARIN SODIUM 60 MG/0.6ML ~~LOC~~ SOLN
50.0000 mg | SUBCUTANEOUS | Status: DC
  Filled 2020-07-27: qty 0.5

## 2020-07-27 MED ORDER — ENOXAPARIN SODIUM 120 MG/0.8ML ~~LOC~~ SOLN
110.0000 mg | Freq: Two times a day (BID) | SUBCUTANEOUS | Status: DC
  Administered 2020-07-27 – 2020-07-30 (×7): 110 mg via SUBCUTANEOUS
  Filled 2020-07-27 (×8): qty 0.74

## 2020-07-27 MED ORDER — SODIUM CHLORIDE 0.9 % IV SOLN
100.0000 mg | Freq: Every day | INTRAVENOUS | Status: AC
  Administered 2020-07-28 – 2020-07-31 (×4): 100 mg via INTRAVENOUS
  Filled 2020-07-27 (×3): qty 20
  Filled 2020-07-27 (×2): qty 100

## 2020-07-27 MED ORDER — SODIUM CHLORIDE 0.9 % IV SOLN
500.0000 mg | INTRAVENOUS | Status: DC
  Administered 2020-07-27: 500 mg via INTRAVENOUS
  Filled 2020-07-27: qty 500

## 2020-07-27 MED ORDER — METHYLPREDNISOLONE SODIUM SUCC 125 MG IJ SOLR
125.0000 mg | Freq: Two times a day (BID) | INTRAMUSCULAR | Status: DC
  Administered 2020-07-27 – 2020-07-31 (×8): 125 mg via INTRAVENOUS
  Filled 2020-07-27 (×8): qty 2

## 2020-07-27 MED ORDER — ZINC SULFATE 220 (50 ZN) MG PO CAPS
220.0000 mg | ORAL_CAPSULE | Freq: Every day | ORAL | Status: DC
  Administered 2020-07-27 – 2020-08-01 (×6): 220 mg via ORAL
  Filled 2020-07-27 (×6): qty 1

## 2020-07-27 MED ORDER — SODIUM CHLORIDE 0.9 % IV SOLN
2.0000 g | INTRAVENOUS | Status: AC
  Administered 2020-07-28 – 2020-08-01 (×5): 2 g via INTRAVENOUS
  Filled 2020-07-27 (×5): qty 20

## 2020-07-27 MED ORDER — ONDANSETRON HCL 4 MG/2ML IJ SOLN
4.0000 mg | Freq: Once | INTRAMUSCULAR | Status: AC
  Administered 2020-07-27: 4 mg via INTRAVENOUS
  Filled 2020-07-27: qty 2

## 2020-07-27 NOTE — ED Notes (Signed)
RN told admitting MD of pt's oxygen sat of 86% with good pleth. Per admitting MD, RN called RT for pt to be placed on either heated high flow or a salter, for a target oxygen sat of 94%

## 2020-07-27 NOTE — H&P (Signed)
History and Physical   Maria Duncan VPX:[id number] DOB: October 22, 1965 DOA: 07/27/2020  PCP: [name], PA Outpatient Specialists: Golden Hurter, sleep medicine and Dr. Mertie Moores, cardiology Patient coming from: home via EMS  I have personally briefly reviewed patient's old medical records in [place].  Chief Concern: shortness of breath   HPI: Maria Duncan is a 55 y.o. female with medical history significant for hypertension, obesity, pulmonary hypertension, obstructive sleep apnea on CPAP, obesity hypoventilation syndrome, heart failure reduced ejection fraction, heart failure preserved ejection fraction, presents to the emergency department for chief concerns of worsening shortness of breath.  Patient was diagnosed with COVID-19 on 07/21/2020.  She reports that she is unvaccinated for COVID-19.  She reports that she has been worsening and progressive in terms of her shortness of breath.  She reports that while in the emergency department her symptoms are improved with treatment provided by ED provider and oxygen supplementation.  She reports worsening shortness of starting 07/26/20. She was sleeping when she woke up short of breath, nauseous, and had diarrhea.  She endorses subjective fever at home and states that her husband took her temperature and did not let her know what it was.  She endorses feeling hot.  She endorses cough that is productive.  She endorses diarrhea, no blood, too much to count, watery. She endorses nausea and vomiting x 3, denies blood.  She states that her vomitus has been bile and or food contents.  Vaccination: she denies vaccination for COVID-19  Social history: lives with spouse and endorses that spouse is hospitalized for covid 19. She denies history of tobacco use, etoh, or recreational drug use.   ROS: Constitutional: + weight change, + fever (subjective) ENT/Mouth: no sore throat, no rhinorrhea Eyes: no eye pain, no vision changes Cardiovascular:  no chest pain, + dyspnea,  no edema, no palpitations Respiratory: + cough, + sputum, + wheezing Gastrointestinal: + nausea, + vomiting, + diarrhea, no constipation Genitourinary: no urinary incontinence, no dysuria, no hematuria Musculoskeletal: no arthralgias, + myalgias Skin: no skin lesions, no pruritus, Neuro: + weakness, no loss of consciousness, no syncope Psych: no anxiety, no depression, + decrease appetite Heme/Lymph: no bruising, no bleeding  ED Course: Discussed with ED provider, patient requiring hospitalization due to COVID-19 infection in worsening shortness of breath and found to be hypoxic requiring nonrebreather on 10 L.  Initial vital signs in the ED was afebrile at 98.5 Fahrenheit, increased respiration rate of 24, heart rate of 103, blood pressure 113/73, 95% on nonrebreather.  Assessment/Plan  Principal Problem:   Acute hypoxemic respiratory failure due to COVID-19 [facility]) Active Problems:   Essential hypertension   NICM (nonischemic cardiomyopathy) (HCC)   Morbid obesity (HCC)   Pulmonary hypertension, unspecified (HCC)   OSA (obstructive sleep apnea)   Hypoventilation associated with obesity (HCC)   CAD in native artery non obstructive on cath 12/09/19   Combined congestive systolic and diastolic heart failure (HCC)   Acute hypoxemic respiratory failure secondary to COVID-19 infection -Patient is unvaccinated -Was diagnosed on 07/21/2020 -ED provider ordered remdesivir 200 mg IV, this will be continued -ED provider ordered and administered Decadron 10 mg IV -Solu-Medrol 1 mg/kg for COVID-19 treatment order set -Admit to progressive for increased oxygen requirement  Sepsis is not ruled out-complicated by active [id number] infection with hypoxemia requiring increased oxygen supplementation -Blood cultures x2 collected -Elevated D-dimer, pro-Cal, increased LFTs -Ceftriaxone and azithromycin IV for possible superimposed bacterial pneumonia -Patient is maintaining  MAP no indication for IVF at  this time and in setting of history of heart failure and COVID-19 infection I feel fluid conservative is indicated  Elevated D-dimer and sinus tachycardia-PE is a high probability -VQ scan ordered -GFR not appropriate level for CTA for PE imaging  Steroid treatment - anticipate elevated blood glucose level  -Insulin SSI with at bedtime coverage ordered  Obstructive sleep apnea-CPAP nightly, patient endorses compliance  Combined systolic and diastolic heart failure-patient endorses compliance with heart failure medications at home except for day of presentation -Patient appears to be compensated and not in acute exacerbation -Resumed home heart failure medications include Farxiga 10 mg p.o. daily, Coreg 6.25 mg twice daily, Entresto 49-51 mg twice daily, torsemide 20 mg p.o. twice daily, spironolactone 25 mg daily  Sinus tachycardia-secondary to acuity and shortness of breath secondary to COVID-19 infection -Treat primary etiology as above -VQ scan  Chart reviewed.   DVT prophylaxis: Enoxaparin 30 mg subcu Code Status: full code  Diet: Cardiac/carb modified Family Communication: per patient request, I have updated sister, Ms. Jeanell Sparrow  Disposition Plan: pending clinical course Consults called: not at this time Admission status: inpatient to progressive bed  Past Medical History:  Diagnosis Date  . 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 Chest pain of uncertain etiology, non obstructive CAD, pain due to acute HF 12/12/2019  . CHF (congestive heart failure) (HCC)   . Diabetes mellitus without complication (HCC)   . Hypertension   . Hypoventilation associated with obesity (HCC) 12/12/2019  . Morbid obesity (HCC)   . NICM (nonischemic cardiomyopathy) (HCC)   . OSA (obstructive sleep apnea) 12/12/2019  . Pulmonary hypertension, unspecified (HCC) 12/12/2019   Past Surgical History:  Procedure Laterality Date  . ABDOMINAL  HYSTERECTOMY    . RIGHT/LEFT HEART CATH AND CORONARY ANGIOGRAPHY N/A 12/09/2019   Procedure: RIGHT/LEFT HEART CATH AND CORONARY ANGIOGRAPHY;  Surgeon: Dolores Patty, MD;  Location: [facility] INVASIVE CV LAB;  Service: Cardiovascular;  Laterality: N/A;   Social History:  reports that she has never smoked. She has never used smokeless tobacco. She reports that she does not drink alcohol and does not use drugs.  Allergies  Allergen Reactions  . Hyzaar [Losartan Potassium-Hctz] Nausea And Vomiting and Cough   Family History  Problem Relation Age of Onset  . Cancer Mother   . Hyperlipidemia Mother   . Diabetes Father   . Hypertension Father   . Hyperlipidemia Sister   . Hypertension Sister   . Hyperlipidemia Brother   . Hypertension Brother   . Cancer Maternal Grandmother   . Stroke Maternal Grandmother   . Cancer Maternal Grandfather   . Diabetes Maternal Grandfather   . Heart disease Maternal Grandfather   . Hypertension Maternal Grandfather   . Diabetes Paternal Grandmother   . Hypertension Paternal Grandmother   . Cancer Paternal Grandfather   . Hypertension Paternal Grandfather   . Stroke Paternal Grandfather    Family history: Family history reviewed and not pertinent  Prior to Admission medications   Medication Sig Start Date End Date Taking? Authorizing Provider  acetaminophen (TYLENOL) 325 MG tablet Take 2 tablets (650 mg total) by mouth every 4 (four) hours as needed for headache or mild pain. 12/12/19   Leone Brand, NP  aspirin 81 MG EC tablet Take 1 tablet (81 mg total) by mouth daily. 12/20/19   Duke Salvia, MD  carvedilol (COREG) 6.25 MG tablet Take 1 tablet (6.25 mg total) by mouth 2 (two) times daily. 04/23/20  Bensimhon, Bevelyn Buckles, MD  cholecalciferol (VITAMIN D3) 25 MCG (1000 UNIT) tablet Take 1,000 Units by mouth daily.    [provider]  dapagliflozin propanediol (FARXIGA) 10 MG TABS tablet Take 1 tablet (10 mg total) by mouth daily before  breakfast. 01/14/20   Bensimhon, Bevelyn Buckles, MD  docusate sodium (COLACE) 100 MG capsule Take 1 capsule (100 mg total) by mouth 2 (two) times daily. 12/12/19   Leone Brand, NP  pantoprazole (PROTONIX) 40 MG tablet TAKE 1 TABLET (40 MG TOTAL) BY MOUTH DAILY. Patient taking differently: Take 40 mg by mouth daily as needed (stomach).  02/19/20   Nahser, Deloris Ping, MD  polyethylene glycol (MIRALAX / GLYCOLAX) 17 g packet Take 17 g by mouth daily as needed (constipation). 12/12/19   Leone Brand, NP  sacubitril-valsartan (ENTRESTO) 49-51 MG Take 1 tablet by mouth 2 (two) times daily. 03/11/20   Bensimhon, Bevelyn Buckles, MD  spironolactone (ALDACTONE) 25 MG tablet Take 1 tablet (25 mg total) by mouth daily. 12/20/19   Duke Salvia, MD  torsemide (DEMADEX) 20 MG tablet TAKE 1 TABLET (20 MG TOTAL) BY MOUTH 2 (TWO) TIMES DAILY. 07/13/20   Bensimhon, Bevelyn Buckles, MD  TRUEplus Lancets 28G MISC 1 each by Does not apply route in the morning and at bedtime. 12/25/19   Anders Simmonds, PA-C   Physical Exam: Vitals:   07/27/20 0149 07/27/20 0200 07/27/20 0215 07/27/20 0245  BP: 119/69 101/75 102/77 103/66  Pulse: (!) 103 (!) 102 100 95  Resp: 20 20 (!) 29 (!) 22  Temp:      TempSrc:      SpO2: 91% 94% 94% 92%   Constitutional: appears age-appropriate, NAD, calm, comfortable Eyes: PERRL, lids and conjunctivae normal ENMT: Mucous membranes are moist. Posterior pharynx clear of any exudate or lesions. Age-appropriate dentition. Hearing appropriate Neck: normal, supple, no masses, no thyromegaly Respiratory: Diffuse decreased breath sounds bilaterally, + wheezing, no crackles. Normal respiratory effort. No accessory muscle use.  Cardiovascular: Regular rate and rhythm, no murmurs / rubs / gallops. No extremity edema. 2+ pedal pulses. No carotid bruits.  Abdomen: Obese abdomen, no tenderness, no masses palpated, no hepatosplenomegaly. Bowel sounds hyperactive.  Musculoskeletal: no clubbing / cyanosis. No joint  deformity upper and lower extremities. Good ROM, no contractures, no atrophy. Normal muscle tone.  Skin: no rashes, lesions, ulcers. No induration Neurologic: Sensation intact. Strength 5/5 in all 4.  Psychiatric: Normal judgment and insight. Alert and oriented x 3. Normal mood.   EKG: independently reviewed, showing sinus tachycardia, rate of 105, QTc 447  Chest x-ray on Admission: I personally reviewed and I agree with radiologist reading as below.  DG Chest Port 1 View  Result Date: 07/27/2020 CLINICAL DATA:  Hypoxic EXAM: PORTABLE CHEST 1 VIEW COMPARISON:  07/23/2020 FINDINGS: Lung volumes are small and pulmonary insufflation has slightly diminished since prior examination. There has developed extensive bilateral slightly asymmetric mid and lower lung zone pulmonary infiltrates, likely infectious or inflammatory. Bilateral hilar enlargement may represent hilar adenopathy or central pulmonary arterial enlargement. Cardiac size is mildly enlarged, unchanged. No acute bone abnormality. IMPRESSION: Interval development of extensive mid and lower lung zone pulmonary infiltrate, likely infectious in the acute setting. Bilateral hilar enlargement representing either hilar adenopathy versus pulmonary vascular enlargement. In Electronically Signed   By: Helyn Numbers MD   On: 07/27/2020 02:11   Labs on Admission: I have personally reviewed following labs  CBC: Recent Labs  Lab 07/23/20 1202 07/27/20 0150  WBC  8.2 11.0*  NEUTROABS 6.4 8.4*  HGB 12.8 12.5  HCT 38.0 37.3  MCV 87.4 86.5  PLT 101* 221   Basic Metabolic Panel: Recent Labs  Lab 07/23/20 1202 07/27/20 0150  NA 129* 131*  K 4.3 5.0  CL 92* 93*  CO2 24 25  GLUCOSE 134* 139*  BUN 14 40*  CREATININE 1.34* 2.04*  CALCIUM 8.7* 8.8*   GFR: Estimated Creatinine Clearance: 38.5 mL/min (A) (by C-G formula based on SCr of 2.04 mg/dL (H)).  Liver Function Tests: Recent Labs  Lab 07/23/20 1202 07/27/20 0150  AST 77* 199*  ALT  50* 138*  ALKPHOS 58 100  BILITOT 0.7 1.0  PROT 7.3 7.2  ALBUMIN 3.1* 2.5*   Lipid Profile: Recent Labs    07/27/20 0150  TRIG 144   Anemia Panel: Recent Labs    07/27/20 0150  FERRITIN 4,546*   Urine analysis:    Component Value Date/Time   COLORURINE COLORLESS (A) 12/07/2019 2108   APPEARANCEUR CLEAR 12/07/2019 2108   LABSPEC 1.006 12/07/2019 2108   PHURINE 7.0 12/07/2019 2108   GLUCOSEU NEGATIVE 12/07/2019 2108   HGBUR NEGATIVE 12/07/2019 2108   BILIRUBINUR NEGATIVE 12/07/2019 2108   BILIRUBINUR neg 03/02/2014 1606   KETONESUR NEGATIVE 12/07/2019 2108   PROTEINUR NEGATIVE 12/07/2019 2108   UROBILINOGEN 1.0 03/02/2014 1606   UROBILINOGEN 0.2 03/17/2013 1450   NITRITE NEGATIVE 12/07/2019 2108   LEUKOCYTESUR NEGATIVE 12/07/2019 2108   Johnita Palleschi N Cleophus Mendonsa D.O. [facility]  If 7PM-7AM, please contact overnight-coverage provider If 7AM-7PM, please contact day coverage provider [email or web address]  07/27/2020, 3:40 AM

## 2020-07-27 NOTE — Progress Notes (Signed)
Lower extremity venous bilateral study completed.   Please see CV Proc for preliminary results.   Welden Hausmann, RDMS  

## 2020-07-27 NOTE — ED Triage Notes (Addendum)
Pt BIB EMS from home for c/o worsening covid symptoms. Per EMS, Dx 12/25. 58% on both RA &10L, 80% Non rebreather mask.   Pt began c/o CP 10/10 en route to [facility] ED.   P 110 97.9 temp 100/50  Hx CHF

## 2020-07-27 NOTE — Progress Notes (Addendum)
ANTICOAGULATION CONSULT NOTE - Initial Consult  Pharmacy Consult for Enoxaparin Indication: Empiric VTE treatment due to elevated d-dimer  Allergies  Allergen Reactions  . Hyzaar [Losartan Potassium-Hctz] Nausea And Vomiting and Cough    Patient can take Entresto now    Patient Measurements:    Vital Signs: Temp: 97.8 F (36.6 C) (01/03 0929) Temp Source: Oral (01/03 0929) BP: 96/73 (01/03 0929) Pulse Rate: 84 (01/03 0929)  Labs: Recent Labs    07/27/20 0150  HGB 12.5  HCT 37.3  PLT 221  CREATININE 2.04*    Estimated Creatinine Clearance: 38.5 mL/min (A) (by C-G formula based on SCr of 2.04 mg/dL (H)).   Medical History: Past Medical History:  Diagnosis Date  . 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 Chest pain of uncertain etiology, non obstructive CAD, pain due to acute HF 12/12/2019  . CHF (congestive heart failure) (HCC)   . Diabetes mellitus without complication (HCC)   . Hypertension   . Hypoventilation associated with obesity (HCC) 12/12/2019  . Morbid obesity (HCC)   . NICM (nonischemic cardiomyopathy) (HCC)   . OSA (obstructive sleep apnea) 12/12/2019  . Pulmonary hypertension, unspecified (HCC) 12/12/2019   Assessment: 55 yo female presented on 07/27/2020 with shortness of breath. Recently diagnosed with COVID-19 on 07/21/2020. Pharmacy consulted to dose enoxaparin for empiric VTE treatment due to elevated d-dimer. D-dimer 3.89. CrCl 38. Hgb 12.5. No anticoagulation prior to admission  Goal of Therapy:  Anti-Xa level 0.6-1 units/ml 4hrs after LMWH dose given Monitor platelets by anticoagulation protocol: Yes   Plan:  Start enoxaparin 110mg  q12h  Follow up doppler and CT chest or VQ scan Watch renal function  , PharmD Clinical Pharmacist  07/27/2020,9:58 AM

## 2020-07-27 NOTE — Progress Notes (Signed)
RN called RT to come assess pt. RT at bedside to find pt on 15L NRB. RT switched pt to 15L salter. Pt tolerated well. RT will continue to monitor pt.

## 2020-07-27 NOTE — Progress Notes (Signed)
Maria Duncan  UEA:[id number] DOB: May 27, 1966 DOA: 07/27/2020 PCP: Pcp, No    Brief Narrative:  55 year old with a history of HTN, obesity, pulmonary hypertension, OSA/OHS on CPAP, and combined systolic/diastolic CHF who presented to the ED with worsening shortness of breath.  She was diagnosed with Covid 12/28.  Since that time she has had progressively worsening shortness of breath, and some intermittent diarrhea.  In the ED she was found to be hypoxic and required 10 L oxygen support to keep saturations at 90% or better.  CXR noted extensive bilateral pulmonary infiltrates.  Significant Events:  12/28 diagnosed with Covid 1/3 admit via [place]  Date of Positive COVID Test:  12/28  Vaccination Status: Unvaccinated  COVID-19 specific Treatment: Remdesivir 1/3 > Solu-Medrol 1/3 > Actemra 1/3  Antimicrobials:  Rocephin Azithromycin  DVT prophylaxis: Lovenox  Subjective: High oxygen requirement continues with patient vacillating between NRB and salter HFNC.   I have visited the patient at bedside.  I discussed the nature of Covid pneumonia.  I discussed our treatment options.  I discussed the off label use of Actemra and severe Covid pneumonia.  I explained the mechanism of its action and the potential side effects associated with it.  The patient voiced understanding and agreed to its dosing.  Assessment & Plan:  Severe COVID Pneumonia - acute hypoxic respiratory failure Continue Solu-Medrol and Remdesivir - add Actemra  Recent Labs  Lab 07/23/20 1202 07/27/20 0150  DDIMER  --  3.89*  FERRITIN  --  4,546*  CRP  --  23.9*  ALT 50* 138*  PROCALCITON  --  0.83    Markedly elevated D-dimer  Transaminitis  Acute renal failure   OSA/OHS On nightly CPAP at home  Chronic combined systolic and diastolic congestive heart failure  Obesity - There is no height or weight on file to calculate BMI.   Code Status: FULL CODE Family Communication:  Status is:  Inpatient  Remains inpatient appropriate because:Inpatient level of care appropriate due to severity of illness   Dispo: The patient is from: Home              Anticipated d/c is to: Home              Anticipated d/c date is: > 3 days              Patient currently is not medically stable to d/c.   Consultants:  none  Objective: Blood pressure 96/62, pulse 84, temperature 98.5 F (36.9 C), temperature source Oral, resp. rate (!) 31, SpO2 (!) 88 %. No intake or output data in the 24 hours ending 07/27/20 0933 There were no vitals filed for this visit.  Examination:   CBC: Recent Labs  Lab 07/23/20 1202 07/27/20 0150  WBC 8.2 11.0*  NEUTROABS 6.4 8.4*  HGB 12.8 12.5  HCT 38.0 37.3  MCV 87.4 86.5  PLT 101* 221   Basic Metabolic Panel: Recent Labs  Lab 07/23/20 1202 07/27/20 0150  NA 129* 131*  K 4.3 5.0  CL 92* 93*  CO2 24 25  GLUCOSE 134* 139*  BUN 14 40*  CREATININE 1.34* 2.04*  CALCIUM 8.7* 8.8*   GFR: Estimated Creatinine Clearance: 38.5 mL/min (A) (by C-G formula based on SCr of 2.04 mg/dL (H)).  Liver Function Tests: Recent Labs  Lab 07/23/20 1202 07/27/20 0150  AST 77* 199*  ALT 50* 138*  ALKPHOS 58 100  BILITOT 0.7 1.0  PROT 7.3 7.2  ALBUMIN 3.1* 2.5*  HbA1C: Hgb A1c MFr Bld  Date/Time Value Ref Range Status  12/08/2019 04:35 AM 7.1 (H) 4.8 - 5.6 % Final    Comment:    (NOTE) Pre diabetes:          5.7%-6.4% Diabetes:              >6.4% Glycemic control for   <7.0% adults with diabetes     CBG: Recent Labs  Lab 07/27/20 0743  GLUCAP 192*    Recent Results (from the past 240 hour(s))  Novel Coronavirus, NAA ([facility])     Status: Abnormal   Collection Time: 07/21/20 12:19 PM   Specimen: Nasopharyngeal(NP) swabs in vial transport medium  Result Value Ref Range Status   SARS-CoV-2, NAA Detected (A) Not Detected Final    Comment: Patients who have a positive COVID-19 test result may now have treatment options. Treatment  options are available for patients with mild to moderate symptoms and for hospitalized patients. Visit our website at [email or web address] for resources and information. This nucleic acid amplification test was developed and its performance characteristics determined by [employer]. Nucleic acid amplification tests include RT-PCR and TMA. This test has not been FDA cleared or approved. This test has been authorized by FDA under an Emergency Use Authorization (EUA). This test is only authorized for the duration of time the declaration that circumstances exist justifying the authorization of the emergency use of in vitro diagnostic tests for detection of SARS-CoV-2 virus and/or diagnosis of COVID-19 infection under section 564(b)(1) of the Act, 21 U.S.C. 629UTM-5(Y) (1), unless the authorization is terminated or revoked sooner. When diagnostic testing is negativ e, the possibility of a false negative result should be considered in the context of a patient's recent exposures and the presence of clinical signs and symptoms consistent with COVID-19. An individual without symptoms of COVID-19 and who is not shedding SARS-CoV-2 virus would expect to have a negative (not detected) result in this assay.   SARS-COV-2, NAA 2 DAY TAT     Status: None   Collection Time: 07/21/20 12:19 PM  Result Value Ref Range Status   SARS-CoV-2, NAA 2 DAY TAT Performed  Final     Scheduled Meds: . aspirin EC  81 mg Oral Daily  . carvedilol  6.25 mg Oral BID WC  . dapagliflozin propanediol  10 mg Oral QAC breakfast  . enoxaparin (LOVENOX) injection  50 mg Subcutaneous Q24H  . insulin aspart  0-15 Units Subcutaneous TID WC  . insulin aspart  0-5 Units Subcutaneous QHS  . methylPREDNISolone (SOLU-MEDROL) injection  1 mg/kg Intravenous Q12H   Followed by  . [START ON 07/30/2020] predniSONE  50 mg Oral Daily  . sacubitril-valsartan  1 tablet Oral BID  . spironolactone  25 mg Oral Daily  .  torsemide  20 mg Oral BID   Continuous Infusions: . azithromycin Stopped (07/27/20 0837)  . cefTRIAXone (ROCEPHIN)  IV Stopped (07/27/20 0617)  . [START ON 07/28/2020] remdesivir 100 mg in NS 100 mL       LOS: 0 days   Lonia Blood, MD [facility] Office  [phone] Pager - Text Page per Amion  If 7PM-7AM, please contact night-coverage per Amion 07/27/2020, 9:33 AM

## 2020-07-27 NOTE — ED Notes (Signed)
Breakfast tray at bedside, attempted to changed o2 delivery method to [place], Sp02 decreased to 80%, placed NRB back on, and informed patient awaiting on RT

## 2020-07-27 NOTE — ED Notes (Signed)
Lunch Tray Ordered @ 1045 

## 2020-07-27 NOTE — ED Provider Notes (Signed)
[facility] EMERGENCY DEPARTMENT Provider Note  CSN: [id number] Arrival date & time: 07/27/20 0048  Chief Complaint(s) No chief complaint on file.  HPI Maria Duncan is a 55 y.o. female with a past medical history listed below including CHF with a last EF of 50 to 55% and grade 1 diastolic dysfunction on October 2021 on Lasix, pulmonary hypertension on 3 L nasal cannula who was diagnosed with COVID-19 on December 30, presents to the emergency department with respiratory distress.  Patient reports this is been ongoing for approximately 4 days and gradually worsening since. She is endorsing gradually worsening generalized fatigue and malaise. She reports intermittent nausea and nonbloody nonbilious emesis. She is able to keep her home medication down. Denies any overt chest pain. Endorses diffuse myalgias. Reports nonbloody diarrhea.  Patient reports that she has been too tired to get her oxygen from downstairs.  EMS was called out and noted patient was satting in the 50s on room air and 10 L nasal cannula.  She required transition to nonrebreather.  HPI  Past Medical History Past Medical History:  Diagnosis Date  . 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 Chest pain of uncertain etiology, non obstructive CAD, pain due to acute HF 12/12/2019  . CHF (congestive heart failure) (HCC)   . Diabetes mellitus without complication (HCC)   . Hypertension   . Hypoventilation associated with obesity (HCC) 12/12/2019  . Morbid obesity (HCC)   . NICM (nonischemic cardiomyopathy) (HCC)   . OSA (obstructive sleep apnea) 12/12/2019  . Pulmonary hypertension, unspecified (HCC) 12/12/2019   Patient Active Problem List   Diagnosis Date Noted  . Acute hypoxemic respiratory failure due to COVID-19 (HCC) 07/27/2020  . Combined congestive systolic and diastolic heart failure (HCC) 07/27/2020  . Acute on chronic combined systolic and diastolic CHF (congestive  heart failure) (HCC) 01/24/2020  . Chest pain of uncertain etiology, non obstructive CAD, pain due to acute HF 12/12/2019  . Pulmonary hypertension, unspecified (HCC) 12/12/2019  . OSA (obstructive sleep apnea) 12/12/2019  . Hypoventilation associated with obesity (HCC) 12/12/2019  . CAD in native artery non obstructive on cath 12/09/19 12/12/2019  . NICM (nonischemic cardiomyopathy) (HCC)   . 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 Morbid obesity (HCC)   . CHF exacerbation (HCC) 12/07/2019  . Cardiomegaly 12/07/2019  . Essential hypertension 12/07/2019  . CHF (congestive heart failure) (HCC) 12/07/2019   Home Medication(s) Prior to Admission medications   Medication Sig Start Date End Date Taking? Authorizing Provider  acetaminophen (TYLENOL) 325 MG tablet Take 2 tablets (650 mg total) by mouth every 4 (four) hours as needed for headache or mild pain. 12/12/19   Leone Brand, NP  aspirin 81 MG EC tablet Take 1 tablet (81 mg total) by mouth daily. 12/20/19   Duke Salvia, MD  carvedilol (COREG) 6.25 MG tablet Take 1 tablet (6.25 mg total) by mouth 2 (two) times daily. 04/23/20   Bensimhon, Bevelyn Buckles, MD  cholecalciferol (VITAMIN D3) 25 MCG (1000 UNIT) tablet Take 1,000 Units by mouth daily.    [provider]  dapagliflozin propanediol (FARXIGA) 10 MG TABS tablet Take 1 tablet (10 mg total) by mouth daily before breakfast. 01/14/20   Bensimhon, Bevelyn Buckles, MD  docusate sodium (COLACE) 100 MG capsule Take 1 capsule (100 mg total) by mouth 2 (two) times daily. 12/12/19   Leone Brand, NP  pantoprazole (PROTONIX) 40 MG tablet TAKE 1 TABLET (  40 MG TOTAL) BY MOUTH DAILY. Patient taking differently: Take 40 mg by mouth daily as needed (stomach).  02/19/20   Nahser, Deloris Ping, MD  polyethylene glycol (MIRALAX / GLYCOLAX) 17 g packet Take 17 g by mouth daily as needed (constipation). 12/12/19   Leone Brand, NP  sacubitril-valsartan (ENTRESTO) 49-51 MG Take 1  tablet by mouth 2 (two) times daily. 03/11/20   Bensimhon, Bevelyn Buckles, MD  spironolactone (ALDACTONE) 25 MG tablet Take 1 tablet (25 mg total) by mouth daily. 12/20/19   Duke Salvia, MD  torsemide (DEMADEX) 20 MG tablet TAKE 1 TABLET (20 MG TOTAL) BY MOUTH 2 (TWO) TIMES DAILY. 07/13/20   Bensimhon, Bevelyn Buckles, MD  TRUEplus Lancets 28G MISC 1 each by Does not apply route in the morning and at bedtime. 12/25/19   Anders Simmonds, PA-C                                                                                                                                    Past Surgical History Past Surgical History:  Procedure Laterality Date  . ABDOMINAL HYSTERECTOMY    . RIGHT/LEFT HEART CATH AND CORONARY ANGIOGRAPHY N/A 12/09/2019   Procedure: RIGHT/LEFT HEART CATH AND CORONARY ANGIOGRAPHY;  Surgeon: Dolores Patty, MD;  Location: [facility] INVASIVE CV LAB;  Service: Cardiovascular;  Laterality: N/A;   Family History Family History  Problem Relation Age of Onset  . Cancer Mother   . Hyperlipidemia Mother   . Diabetes Father   . Hypertension Father   . Hyperlipidemia Sister   . Hypertension Sister   . Hyperlipidemia Brother   . Hypertension Brother   . Cancer Maternal Grandmother   . Stroke Maternal Grandmother   . Cancer Maternal Grandfather   . Diabetes Maternal Grandfather   . Heart disease Maternal Grandfather   . Hypertension Maternal Grandfather   . Diabetes Paternal Grandmother   . Hypertension Paternal Grandmother   . Cancer Paternal Grandfather   . Hypertension Paternal Grandfather   . Stroke Paternal Grandfather     Social History Social History   Tobacco Use  . Smoking status: Never Smoker  . Smokeless tobacco: Never Used  Substance Use Topics  . Alcohol use: No  . Drug use: No   Allergies Hyzaar [losartan potassium-hctz]  Review of Systems Review of Systems All other systems are reviewed and are negative for acute change except as noted in the HPI  Physical  Exam Vital Signs  I have reviewed the triage vital signs BP 112/75   Pulse (!) 103   Temp 98.5 F (36.9 C) (Oral)   Resp (!) 24   SpO2 79% on 3LNC  Physical Exam Vitals reviewed.  Constitutional:      General: She is not in acute distress.    Appearance: She is well-developed and well-nourished. She is not diaphoretic.  HENT:     Head: Normocephalic and atraumatic.  Nose: Nose normal.  Eyes:     General: No scleral icterus.       Right eye: No discharge.        Left eye: No discharge.     Extraocular Movements: EOM normal.     Conjunctiva/sclera: Conjunctivae normal.     Pupils: Pupils are equal, round, and reactive to light.  Cardiovascular:     Rate and Rhythm: Normal rate and regular rhythm.     Heart sounds: No murmur heard. No friction rub. No gallop.   Pulmonary:     Effort: Tachypnea and respiratory distress present.     Breath sounds: No stridor. Examination of the right-middle field reveals rales. Examination of the left-middle field reveals rales. Examination of the right-lower field reveals rales. Examination of the left-lower field reveals rales. Rales present. No wheezing.  Abdominal:     General: There is no distension.     Palpations: Abdomen is soft.     Tenderness: There is no abdominal tenderness.  Musculoskeletal:        General: No tenderness or edema.     Cervical back: Normal range of motion and neck supple.  Skin:    General: Skin is warm and dry.     Findings: No erythema or rash.  Neurological:     Mental Status: She is alert and oriented to person, place, and time.  Psychiatric:        Mood and Affect: Mood and affect normal.     ED Results and Treatments Labs (all labs ordered are listed, but only abnormal results are displayed) Labs Reviewed  CBC WITH DIFFERENTIAL/PLATELET - Abnormal; Notable for the following components:      Result Value   WBC 11.0 (*)    Neutro Abs 8.4 (*)    Abs Immature Granulocytes 0.24 (*)    All other  components within normal limits  COMPREHENSIVE METABOLIC PANEL - Abnormal; Notable for the following components:   Sodium 131 (*)    Chloride 93 (*)    Glucose, Bld 139 (*)    BUN 40 (*)    Creatinine, Ser 2.04 (*)    Calcium 8.8 (*)    Albumin 2.5 (*)    AST 199 (*)    ALT 138 (*)    GFR, Estimated 28 (*)    All other components within normal limits  D-DIMER, QUANTITATIVE (NOT AT [facility]) - Abnormal; Notable for the following components:   D-Dimer, Quant 3.89 (*)    All other components within normal limits  LACTATE DEHYDROGENASE - Abnormal; Notable for the following components:   LDH 780 (*)    All other components within normal limits  FERRITIN - Abnormal; Notable for the following components:   Ferritin 4,546 (*)    All other components within normal limits  FIBRINOGEN - Abnormal; Notable for the following components:   Fibrinogen 727 (*)    All other components within normal limits  C-REACTIVE PROTEIN - Abnormal; Notable for the following components:   CRP 23.9 (*)    All other components within normal limits  BRAIN NATRIURETIC PEPTIDE - Abnormal; Notable for the following components:   B Natriuretic Peptide 420.3 (*)    All other components within normal limits  CULTURE, BLOOD (ROUTINE X 2)  CULTURE, BLOOD (ROUTINE X 2)  LACTIC ACID, PLASMA  LACTIC ACID, PLASMA  TRIGLYCERIDES  PROCALCITONIN  I-STAT BETA HCG BLOOD, ED ([facility], [facility], [facility] ONLY)  EKG  EKG Interpretation  Date/Time:  Monday July 27 2020 00:55:34 EST Ventricular Rate:  105 PR Interval:    QRS Duration: 94 QT Interval:  338 QTC Calculation: 447 R Axis:   39 Text Interpretation: Sinus tachycardia ST elev, probable normal early repol pattern Confirmed by Drema Pry [phone]) on 07/27/2020 1:14:37 AM      Radiology DG Chest Port 1 View  Result Date: 07/27/2020 CLINICAL DATA:  Hypoxic EXAM:  PORTABLE CHEST 1 VIEW COMPARISON:  07/23/2020 FINDINGS: Lung volumes are small and pulmonary insufflation has slightly diminished since prior examination. There has developed extensive bilateral slightly asymmetric mid and lower lung zone pulmonary infiltrates, likely infectious or inflammatory. Bilateral hilar enlargement may represent hilar adenopathy or central pulmonary arterial enlargement. Cardiac size is mildly enlarged, unchanged. No acute bone abnormality. IMPRESSION: Interval development of extensive mid and lower lung zone pulmonary infiltrate, likely infectious in the acute setting. Bilateral hilar enlargement representing either hilar adenopathy versus pulmonary vascular enlargement. In Electronically Signed   By: Helyn Numbers MD   On: 07/27/2020 02:11    Pertinent labs & imaging results that were available during my care of the patient were reviewed by me and considered in my medical decision making (see chart for details).  Medications Ordered in ED Medications  remdesivir 200 mg in sodium chloride 0.9% 250 mL IVPB (has no administration in time range)    Followed by  remdesivir 100 mg in sodium chloride 0.9 % 100 mL IVPB (has no administration in time range)  aspirin EC tablet 81 mg (has no administration in time range)  carvedilol (COREG) tablet 6.25 mg (has no administration in time range)  sacubitril-valsartan (ENTRESTO) 49-51 mg per tablet (has no administration in time range)  spironolactone (ALDACTONE) tablet 25 mg (has no administration in time range)  torsemide (DEMADEX) tablet 20 mg (has no administration in time range)  dapagliflozin propanediol (FARXIGA) tablet 10 mg (has no administration in time range)  pantoprazole (PROTONIX) EC tablet 40 mg (has no administration in time range)  enoxaparin (LOVENOX) injection 30 mg (has no administration in time range)  methylPREDNISolone sodium succinate (SOLU-MEDROL) 125 mg/2 mL injection 108.125 mg (has no administration in time  range)    Followed by  predniSONE (DELTASONE) tablet 50 mg (has no administration in time range)  guaiFENesin-dextromethorphan (ROBITUSSIN DM) 100-10 MG/5ML syrup 10 mL (has no administration in time range)  chlorpheniramine-HYDROcodone (TUSSIONEX) 10-8 MG/5ML suspension 5 mL (has no administration in time range)  acetaminophen (TYLENOL) tablet 325 mg (has no administration in time range)  ondansetron (ZOFRAN) tablet 4 mg (has no administration in time range)    Or  ondansetron (ZOFRAN) injection 4 mg (has no administration in time range)  ondansetron (ZOFRAN) injection 4 mg (4 mg Intravenous Given 07/27/20 0205)  dexamethasone (DECADRON) injection 10 mg (10 mg Intravenous Given 07/27/20 0249)  Procedures .1-3 Lead EKG Interpretation Performed by: Nira Conn, MD Authorized by: Nira Conn, MD     Interpretation: normal     ECG rate:  90   ECG rate assessment: normal     Rhythm: sinus rhythm     Ectopy: none     Conduction: normal   .Critical Care Performed by: Nira Conn, MD Authorized by: Nira Conn, MD   Critical care provider statement:    Critical care time (minutes):  45   Critical care was necessary to treat or prevent imminent or life-threatening deterioration of the following conditions:  Respiratory failure   Critical care was time spent personally by me on the following activities:  Discussions with consultants, evaluation of patient's response to treatment, examination of patient, ordering and performing treatments and interventions, ordering and review of laboratory studies, ordering and review of radiographic studies, pulse oximetry, re-evaluation of patient's condition, obtaining history from patient or surrogate and review of old charts    (including critical care time)  Medical Decision Making /  ED Course I have reviewed the nursing notes for this encounter and the patient's prior records (if available in EHR or on provided paperwork).   Maria Duncan was evaluated in Emergency Department on 07/27/2020 for the symptoms described in the history of present illness. She was evaluated in the context of the global COVID-19 pandemic, which necessitated consideration that the patient might be at risk for infection with the SARS-CoV-2 virus that causes COVID-19. Institutional protocols and algorithms that pertain to the evaluation of patients at risk for COVID-19 are in a state of rapid change based on information released by regulatory bodies including the CDC and federal and state organizations. These policies and algorithms were followed during the patient's care in the ED.  Patient presents in respiratory distress secondary to known COVID-19 infection requiring increased supplemental O2 from her baseline.  Currently on nonrebreather at 15 L. Chest x-ray is consistent with COVID-19. Patient also has AKI.  Given a dose of Decadron and ordered remdesivir. Spoke with hospitalist service who will admit patient for continued management      Final Clinical Impression(s) / ED Diagnoses Final diagnoses:  Acute hypoxemic respiratory failure due to COVID-19 [facility])      This chart was dictated using voice recognition software.  Despite best efforts to proofread,  errors can occur which can change the documentation meaning.   Nira Conn, MD 07/27/20 [phone]

## 2020-07-27 NOTE — ED Notes (Signed)
Encouraged patient to refrain from long sentences and rest to improve Sp02,

## 2020-07-27 NOTE — ED Notes (Signed)
Patient in bed, resting with eyes closed. NRB in placed, sp02 95%; patient awaken and requested pain medication for stomach pain, informed patient oxygenation is being prioritized at the moment and is requiring higher supplemental delivery.

## 2020-07-28 ENCOUNTER — Inpatient Hospital Stay (HOSPITAL_COMMUNITY): Payer: HRSA Program

## 2020-07-28 DIAGNOSIS — U071 COVID-19: Secondary | ICD-10-CM | POA: Diagnosis present

## 2020-07-28 LAB — MAGNESIUM: Magnesium: 2.8 mg/dL — ABNORMAL HIGH (ref 1.7–2.4)

## 2020-07-28 LAB — CBC WITH DIFFERENTIAL/PLATELET
Abs Immature Granulocytes: 0.36 10*3/uL — ABNORMAL HIGH (ref 0.00–0.07)
Basophils Absolute: 0.1 10*3/uL (ref 0.0–0.1)
Basophils Relative: 0 %
Eosinophils Absolute: 0 10*3/uL (ref 0.0–0.5)
Eosinophils Relative: 0 %
HCT: 38.3 % (ref 36.0–46.0)
Hemoglobin: 12.7 g/dL (ref 12.0–15.0)
Immature Granulocytes: 3 %
Lymphocytes Relative: 19 %
Lymphs Abs: 2.8 10*3/uL (ref 0.7–4.0)
MCH: 29 pg (ref 26.0–34.0)
MCHC: 33.2 g/dL (ref 30.0–36.0)
MCV: 87.4 fL (ref 80.0–100.0)
Monocytes Absolute: 0.5 10*3/uL (ref 0.1–1.0)
Monocytes Relative: 3 %
Neutro Abs: 10.7 10*3/uL — ABNORMAL HIGH (ref 1.7–7.7)
Neutrophils Relative %: 75 %
Platelets: 261 10*3/uL (ref 150–400)
RBC: 4.38 MIL/uL (ref 3.87–5.11)
RDW: 13.2 % (ref 11.5–15.5)
WBC: 14.4 10*3/uL — ABNORMAL HIGH (ref 4.0–10.5)
nRBC: 0.2 % (ref 0.0–0.2)

## 2020-07-28 LAB — C-REACTIVE PROTEIN: CRP: 21.5 mg/dL — ABNORMAL HIGH (ref <1.0)

## 2020-07-28 LAB — I-STAT ARTERIAL BLOOD GAS, ED
Acid-Base Excess: 0 mmol/L (ref 0.0–2.0)
Bicarbonate: 24.7 mmol/L (ref 20.0–28.0)
Calcium, Ion: 1.22 mmol/L (ref 1.15–1.40)
HCT: 37 % (ref 36.0–46.0)
Hemoglobin: 12.6 g/dL (ref 12.0–15.0)
O2 Saturation: 92 %
Potassium: 4.2 mmol/L (ref 3.5–5.1)
Sodium: 132 mmol/L — ABNORMAL LOW (ref 135–145)
TCO2: 26 mmol/L (ref 22–32)
pCO2 arterial: 40.5 mmHg (ref 32.0–48.0)
pH, Arterial: 7.393 (ref 7.350–7.450)
pO2, Arterial: 65 mmHg — ABNORMAL LOW (ref 83.0–108.0)

## 2020-07-28 LAB — COMPREHENSIVE METABOLIC PANEL
ALT: 102 U/L — ABNORMAL HIGH (ref 0–44)
AST: 81 U/L — ABNORMAL HIGH (ref 15–41)
Albumin: 2.3 g/dL — ABNORMAL LOW (ref 3.5–5.0)
Alkaline Phosphatase: 84 U/L (ref 38–126)
Anion gap: 13 (ref 5–15)
BUN: 53 mg/dL — ABNORMAL HIGH (ref 6–20)
CO2: 22 mmol/L (ref 22–32)
Calcium: 8.9 mg/dL (ref 8.9–10.3)
Chloride: 97 mmol/L — ABNORMAL LOW (ref 98–111)
Creatinine, Ser: 2.02 mg/dL — ABNORMAL HIGH (ref 0.44–1.00)
GFR, Estimated: 29 mL/min — ABNORMAL LOW (ref >60)
Glucose, Bld: 218 mg/dL — ABNORMAL HIGH (ref 70–99)
Potassium: 4.6 mmol/L (ref 3.5–5.1)
Sodium: 132 mmol/L — ABNORMAL LOW (ref 135–145)
Total Bilirubin: 0.9 mg/dL (ref 0.3–1.2)
Total Protein: 6.8 g/dL (ref 6.5–8.1)

## 2020-07-28 LAB — FERRITIN: Ferritin: 4308 ng/mL — ABNORMAL HIGH (ref 11–307)

## 2020-07-28 LAB — HEMOGLOBIN A1C
Hgb A1c MFr Bld: 7.5 % — ABNORMAL HIGH (ref 4.8–5.6)
Mean Plasma Glucose: 168.55 mg/dL

## 2020-07-28 LAB — CBG MONITORING, ED
Glucose-Capillary: 215 mg/dL — ABNORMAL HIGH (ref 70–99)
Glucose-Capillary: 313 mg/dL — ABNORMAL HIGH (ref 70–99)

## 2020-07-28 LAB — GLUCOSE, CAPILLARY
Glucose-Capillary: 265 mg/dL — ABNORMAL HIGH (ref 70–99)
Glucose-Capillary: 307 mg/dL — ABNORMAL HIGH (ref 70–99)
Glucose-Capillary: 350 mg/dL — ABNORMAL HIGH (ref 70–99)

## 2020-07-28 LAB — D-DIMER, QUANTITATIVE: D-Dimer, Quant: 2.72 ug/mL-FEU — ABNORMAL HIGH (ref 0.00–0.50)

## 2020-07-28 LAB — MRSA PCR SCREENING: MRSA by PCR: NEGATIVE

## 2020-07-28 MED ORDER — INSULIN ASPART 100 UNIT/ML ~~LOC~~ SOLN
0.0000 [IU] | Freq: Every day | SUBCUTANEOUS | Status: DC
  Administered 2020-07-28: 5 [IU] via SUBCUTANEOUS
  Administered 2020-07-29 – 2020-08-01 (×4): 4 [IU] via SUBCUTANEOUS
  Administered 2020-08-02: 3 [IU] via SUBCUTANEOUS
  Administered 2020-08-03: 4 [IU] via SUBCUTANEOUS
  Administered 2020-08-04: 3 [IU] via SUBCUTANEOUS
  Administered 2020-08-05: 2 [IU] via SUBCUTANEOUS
  Administered 2020-08-06: 3 [IU] via SUBCUTANEOUS

## 2020-07-28 MED ORDER — INSULIN ASPART 100 UNIT/ML ~~LOC~~ SOLN
0.0000 [IU] | Freq: Three times a day (TID) | SUBCUTANEOUS | Status: DC
  Administered 2020-07-28 – 2020-07-30 (×5): 11 [IU] via SUBCUTANEOUS
  Administered 2020-07-30: 20 [IU] via SUBCUTANEOUS
  Administered 2020-07-30: 15 [IU] via SUBCUTANEOUS
  Administered 2020-07-31: 20 [IU] via SUBCUTANEOUS
  Administered 2020-07-31 (×2): 11 [IU] via SUBCUTANEOUS
  Administered 2020-08-01: 15 [IU] via SUBCUTANEOUS
  Administered 2020-08-01: 11 [IU] via SUBCUTANEOUS
  Administered 2020-08-01: 7 [IU] via SUBCUTANEOUS
  Administered 2020-08-02: 11 [IU] via SUBCUTANEOUS
  Administered 2020-08-02: 7 [IU] via SUBCUTANEOUS
  Administered 2020-08-02: 4 [IU] via SUBCUTANEOUS
  Administered 2020-08-03: 11 [IU] via SUBCUTANEOUS
  Administered 2020-08-03: 7 [IU] via SUBCUTANEOUS
  Administered 2020-08-03: 4 [IU] via SUBCUTANEOUS
  Administered 2020-08-04: 15 [IU] via SUBCUTANEOUS
  Administered 2020-08-04: 4 [IU] via SUBCUTANEOUS
  Administered 2020-08-04: 7 [IU] via SUBCUTANEOUS
  Administered 2020-08-05 (×2): 4 [IU] via SUBCUTANEOUS
  Administered 2020-08-05: 7 [IU] via SUBCUTANEOUS
  Administered 2020-08-06 (×2): 11 [IU] via SUBCUTANEOUS
  Administered 2020-08-06: 4 [IU] via SUBCUTANEOUS
  Administered 2020-08-07: 3 [IU] via SUBCUTANEOUS

## 2020-07-28 MED ORDER — FUROSEMIDE 10 MG/ML IJ SOLN
60.0000 mg | Freq: Two times a day (BID) | INTRAMUSCULAR | Status: DC
  Administered 2020-07-28 – 2020-07-30 (×4): 60 mg via INTRAVENOUS
  Filled 2020-07-28 (×4): qty 6

## 2020-07-28 MED ORDER — SPIRONOLACTONE 25 MG PO TABS
25.0000 mg | ORAL_TABLET | Freq: Every day | ORAL | Status: DC
  Administered 2020-07-28 – 2020-08-01 (×5): 25 mg via ORAL
  Filled 2020-07-28 (×5): qty 1

## 2020-07-28 MED ORDER — INSULIN ASPART 100 UNIT/ML ~~LOC~~ SOLN
3.0000 [IU] | Freq: Three times a day (TID) | SUBCUTANEOUS | Status: DC
  Administered 2020-07-28 – 2020-07-29 (×2): 3 [IU] via SUBCUTANEOUS

## 2020-07-28 NOTE — ED Notes (Signed)
Chest percussion performed.  Pt back and front wiped down with wet rag.

## 2020-07-28 NOTE — Progress Notes (Signed)
Maria Duncan  OTL:[id number] DOB: 1966-06-28 DOA: 07/27/2020 PCP: Pcp, No    Brief Narrative:  55 year old with a history of HTN, obesity, pulmonary hypertension, OSA/OHS on CPAP, and combined systolic/diastolic CHF who presented to the ED with worsening shortness of breath.  She was diagnosed with Covid 12/28.  Since that time she has had progressively worsening shortness of breath, and some intermittent diarrhea.  In the ED she was found to be hypoxic and required 10 L oxygen support to keep saturations at 90% or better.  CXR noted extensive bilateral pulmonary infiltrates.  Significant Events:  12/28 diagnosed with Covid 1/3 admit via [place] 1/3 lower extremity venous duplex negative for DVT bilaterally  Date of Positive COVID Test:  12/28  Vaccination Status: Unvaccinated  COVID-19 specific Treatment: Remdesivir 1/3 > Solu-Medrol 1/3 > Actemra 1/3  Antimicrobials:  Rocephin Azithromycin  DVT prophylaxis: Lovenox  Subjective: Requiring escalating oxygen support with patient now on heated high flow nasal cannula as well as nonrebreather.  At the time of my visit she is displaying mild to moderate increased work of breathing but is able to carry on a conversation.  She is somewhat somnolent however and tells me she is "getting very tired."  I spoke to her about the possibility of requiring intubation with mechanical ventilation if improvement was not seen over the course of the day and she understood and fully supported this intervention if necessary.  Assessment & Plan:  Severe COVID Pneumonia - acute hypoxic respiratory failure Continue Solu-Medrol and Remdesivir -has been dosed with Actemra -patient at high risk of further decline which would necessitate immediate intubation/mechanical ventilation -changing bed status to ICU -prone as able to tolerate  Recent Labs  Lab 07/23/20 1202 07/27/20 0150 07/28/20 0357  DDIMER  --  3.89* 2.72*  FERRITIN  --  4,546* 4,308*  CRP   --  23.9* 21.5*  ALT 50* 138* 102*  PROCALCITON  --  0.83  --     Possible bacterial pneumonia Modestly elevated procalcitonin -continue empiric antibiotic therapy for now -low threshold to discontinue if follow-up procalcitonin favorable  Markedly elevated D-dimer No evidence of DVT on venous duplex -may very well simply be due to active Covid infection though her risk of PE is also quite high -continue full anticoagulation for now but if D-dimer continues to trend downward will consider transitioning back to prophylactic dose only  Transaminitis Likely due to Covid itself -LFTs trending downward -continue to monitor closely while on Remdesivir/after having received Actemra  Recent Labs  Lab 07/23/20 1202 07/27/20 0150 07/28/20 0357  AST 77* 199* 81*  ALT 50* 138* 102*  ALKPHOS 58 100 84  BILITOT 0.7 1.0 0.9  PROT 7.3 7.2 6.8  ALBUMIN 3.1* 2.5* 2.3*    Acute renal failure  Likely prerenal azotemia in setting of poor intake due to Covid versus direct effect of Covid -stop hydration as exam is not suggestive of significant volume depletion and patient has a history of CHF  Recent Labs  Lab 07/23/20 1202 07/27/20 0150 07/28/20 0357  CREATININE 1.34* 2.04* 2.02*    OSA/OHS On nightly CPAP at home  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  DM2 A1c 7.5 -does not peer  to require medical therapy as an outpatient - SSI in setting of high dose steroid admin    Code Status: FULL CODE Family Communication:  Status is: Inpatient  Remains inpatient appropriate because:Inpatient level of care appropriate due to severity of illness   Dispo: The patient is from: Home              Anticipated d/c is  to: Home              Anticipated d/c date is: > 3 days              Patient currently is not medically stable to d/c.   Consultants:  none  Objective: Blood pressure 116/77, pulse 65, temperature 97.8 F (36.6 C), temperature source Oral, resp. rate (!) 21, SpO2 92 %. No intake or output data in the 24 hours ending 07/28/20 0909 There were no vitals filed for this visit.  Examination: General: not in extremis - on HHFNC + NRB  Lungs: Fine crackles diffusely without wheezing Cardiovascular: Regular rate and rhythm without murmur gallop or rub normal S1 and S2 Abdomen: Nontender, nondistended, soft, bowel sounds positive, no rebound, no ascites, no appreciable mass Extremities: Trace edema bilateral lower extremities   CBC: Recent Labs  Lab 07/23/20 1202 07/27/20 0150 07/28/20 0357  WBC 8.2 11.0* 14.4*  NEUTROABS 6.4 8.4* 10.7*  HGB 12.8 12.5 12.7  HCT 38.0 37.3 38.3  MCV 87.4 86.5 87.4  PLT 101* 221 261   Basic Metabolic Panel: Recent Labs  Lab 07/23/20 1202 07/27/20 0150 07/28/20 0357  NA 129* 131* 132*  K 4.3 5.0 4.6  CL 92* 93* 97*  CO2 24 25 22   GLUCOSE 134* 139* 218*  BUN 14 40* 53*  CREATININE 1.34* 2.04* 2.02*  CALCIUM 8.7* 8.8* 8.9  MG  --   --  2.8*   GFR: Estimated Creatinine Clearance: 38.9 mL/min (A) (by C-G formula based on SCr of 2.02 mg/dL (H)).  Liver Function Tests: Recent Labs  Lab 07/23/20 1202 07/27/20 0150 07/28/20 0357  AST 77* 199* 81*  ALT 50* 138* 102*  ALKPHOS 58 100 84  BILITOT 0.7 1.0 0.9  PROT 7.3 7.2 6.8  ALBUMIN 3.1* 2.5* 2.3*    HbA1C: Hgb A1c MFr Bld  Date/Time Value Ref Range Status  07/28/2020 03:57 AM 7.5 (H) 4.8 - 5.6 % Final    Comment:    (NOTE) Pre diabetes:          5.7%-6.4%  Diabetes:              >6.4%  Glycemic control for   <7.0% adults with diabetes   12/08/2019 04:35 AM 7.1 (H) 4.8 - 5.6 % Final    Comment:    (NOTE) Pre diabetes:          5.7%-6.4% Diabetes:               >6.4% Glycemic control for   <7.0% adults with diabetes     CBG: Recent Labs  Lab 07/27/20 0743 07/27/20 1216 07/27/20 1700 07/27/20 2318 07/28/20 0818  GLUCAP 192* 290* 296* 226* 215*    Recent Results (from the past 240 hour(s))  Novel Coronavirus, NAA ([facility])     Status: Abnormal   Collection Time: 07/21/20 12:19 PM   Specimen: Nasopharyngeal(NP) swabs in vial transport medium  Result Value Ref Range Status   SARS-CoV-2, NAA Detected (A) Not Detected Final    Comment: Patients who have a positive COVID-19 test result may now  have treatment options. Treatment options are available for patients with mild to moderate symptoms and for hospitalized patients. Visit our website at [email or web address] for resources and information. This nucleic acid amplification test was developed and its performance characteristics determined by [employer]. Nucleic acid amplification tests include RT-PCR and TMA. This test has not been FDA cleared or approved. This test has been authorized by FDA under an Emergency Use Authorization (EUA). This test is only authorized for the duration of time the declaration that circumstances exist justifying the authorization of the emergency use of in vitro diagnostic tests for detection of SARS-CoV-2 virus and/or diagnosis of COVID-19 infection under section 564(b)(1) of the Act, 21 U.S.C. 834HDQ-2(I) (1), unless the authorization is terminated or revoked sooner. When diagnostic testing is negativ e, the possibility of a false negative result should be considered in the context of a patient's recent exposures and the presence of clinical signs and symptoms consistent with COVID-19. An individual without symptoms of COVID-19 and who is not shedding SARS-CoV-2 virus would expect to have a negative (not detected) result in this assay.   SARS-COV-2, NAA 2 DAY TAT     Status: None   Collection Time: 07/21/20 12:19 PM  Result Value  Ref Range Status   SARS-CoV-2, NAA 2 DAY TAT Performed  Final  Blood Culture (routine x 2)     Status: None (Preliminary result)   Collection Time: 07/27/20  1:50 AM   Specimen: BLOOD LEFT ARM  Result Value Ref Range Status   Specimen Description BLOOD LEFT ARM  Final   Special Requests   Final    BOTTLES DRAWN AEROBIC AND ANAEROBIC Blood Culture results may not be optimal due to an inadequate volume of blood received in culture bottles   Culture   Final    NO GROWTH 1 DAY Performed at [facility], [address], [place], [place] [address]    Report Status PENDING  Incomplete     Scheduled Meds: . vitamin C  500 mg Oral Daily  . aspirin EC  81 mg Oral Daily  . carvedilol  6.25 mg Oral BID WC  . enoxaparin (LOVENOX) injection  110 mg Subcutaneous Q12H  . insulin aspart  0-15 Units Subcutaneous TID WC  . insulin aspart  0-5 Units Subcutaneous QHS  . methylPREDNISolone (SOLU-MEDROL) injection  125 mg Intravenous Q12H   Followed by  . [START ON 07/30/2020] predniSONE  50 mg Oral Daily  . senna  1 tablet Oral QHS  . zinc sulfate  220 mg Oral Daily   Continuous Infusions: . sodium chloride 75 mL/hr at 07/27/20 2059  . azithromycin 500 mg (07/28/20 0703)  . cefTRIAXone (ROCEPHIN)  IV Stopped (07/28/20 9211)  . remdesivir 100 mg in NS 100 mL 100 mg (07/28/20 0848)     LOS: 1 day   Lonia Blood, MD [facility] Office  [phone] Pager - Text Page per Amion  If 7PM-7AM, please contact night-coverage per Amion 07/28/2020, 9:09 AM

## 2020-07-28 NOTE — ED Notes (Signed)
SDU Breakfast Ordered 

## 2020-07-28 NOTE — Progress Notes (Signed)
RT note. RT unable to place pt. On BIPAP on at this time per oxygen requirement. Pt. desat to low 80 at this moment. RT/RN place probe on different ear, and have pt. Lay on her side. Sat 100%

## 2020-07-28 NOTE — ED Notes (Signed)
Chest percussion performed.

## 2020-07-28 NOTE — Progress Notes (Signed)
Cross-coverage note:   Patient seen on f/u rounds while holding in ED waiting for progressive bed. Has been saturating upper 80s on 15 Lpm HFNC in addition to 15 Lpm NRB. She is not in acute distress. Received Actemra yesterday. May need to start heated high-flow [place]. Discussed with RN.

## 2020-07-28 NOTE — ED Notes (Signed)
Lunch Tray Ordered @ 1034. 

## 2020-07-28 NOTE — Progress Notes (Signed)
Maria Duncan [id number] Admission Data: 07/28/2020 6:47 PM Attending Provider: Lonia Blood, MD  PCP:Pcp, No Consults/ Treatment Team:   Maria Duncan is a 55 y.o. female patient admitted from ED awake, alert  & orientated  X 3,  Full Code, VSS - Blood pressure (!) 111/97, pulse 63, temperature 98.1 F (36.7 C), temperature source Oral, resp. rate 17, SpO2 92 %., O2   40L HFNC/ NRB . Tele # [id number] placed and pt is currently running:normal sinus rhythm.   IV site WDL:  antecubital r, condition patent and no redness with a transparent dsg that's clean dry and intact.   Pt orientation to unit, room and routine. Information packet given to patient/family and safety video watched.  Admission INP armband ID verified with patient/family, and in place. SR up x 2, fall risk assessment complete with Patient and family verbalizing understanding of risks associated with falls. Pt verbalizes an understanding of how to use the call bell and to call for help before getting out of bed.  Skin, clean-dry- intact without evidence of bruising, or skin tears.   No evidence of skin break down noted on exam  Will cont to monitor and assist as needed.  Phineas Douglas, [place] 07/28/2020 6:47 PM

## 2020-07-29 LAB — CBC WITH DIFFERENTIAL/PLATELET
Abs Immature Granulocytes: 1.17 10*3/uL — ABNORMAL HIGH (ref 0.00–0.07)
Basophils Absolute: 0 10*3/uL (ref 0.0–0.1)
Basophils Relative: 0 %
Eosinophils Absolute: 0 10*3/uL (ref 0.0–0.5)
Eosinophils Relative: 0 %
HCT: 37.3 % (ref 36.0–46.0)
Hemoglobin: 12.2 g/dL (ref 12.0–15.0)
Immature Granulocytes: 6 %
Lymphocytes Relative: 12 %
Lymphs Abs: 2.3 10*3/uL (ref 0.7–4.0)
MCH: 28.5 pg (ref 26.0–34.0)
MCHC: 32.7 g/dL (ref 30.0–36.0)
MCV: 87.1 fL (ref 80.0–100.0)
Monocytes Absolute: 0.7 10*3/uL (ref 0.1–1.0)
Monocytes Relative: 4 %
Neutro Abs: 15.9 10*3/uL — ABNORMAL HIGH (ref 1.7–7.7)
Neutrophils Relative %: 78 %
Platelets: 296 10*3/uL (ref 150–400)
RBC: 4.28 MIL/uL (ref 3.87–5.11)
RDW: 13.6 % (ref 11.5–15.5)
WBC: 20.1 10*3/uL — ABNORMAL HIGH (ref 4.0–10.5)
nRBC: 0.5 % — ABNORMAL HIGH (ref 0.0–0.2)

## 2020-07-29 LAB — COMPREHENSIVE METABOLIC PANEL
ALT: 77 U/L — ABNORMAL HIGH (ref 0–44)
AST: 43 U/L — ABNORMAL HIGH (ref 15–41)
Albumin: 2.3 g/dL — ABNORMAL LOW (ref 3.5–5.0)
Alkaline Phosphatase: 94 U/L (ref 38–126)
Anion gap: 12 (ref 5–15)
BUN: 53 mg/dL — ABNORMAL HIGH (ref 6–20)
CO2: 24 mmol/L (ref 22–32)
Calcium: 9 mg/dL (ref 8.9–10.3)
Chloride: 95 mmol/L — ABNORMAL LOW (ref 98–111)
Creatinine, Ser: 1.84 mg/dL — ABNORMAL HIGH (ref 0.44–1.00)
GFR, Estimated: 32 mL/min — ABNORMAL LOW (ref >60)
Glucose, Bld: 349 mg/dL — ABNORMAL HIGH (ref 70–99)
Potassium: 4.4 mmol/L (ref 3.5–5.1)
Sodium: 131 mmol/L — ABNORMAL LOW (ref 135–145)
Total Bilirubin: 0.9 mg/dL (ref 0.3–1.2)
Total Protein: 6.7 g/dL (ref 6.5–8.1)

## 2020-07-29 LAB — GLUCOSE, CAPILLARY
Glucose-Capillary: 299 mg/dL — ABNORMAL HIGH (ref 70–99)
Glucose-Capillary: 297 mg/dL — ABNORMAL HIGH (ref 70–99)
Glucose-Capillary: 275 mg/dL — ABNORMAL HIGH (ref 70–99)
Glucose-Capillary: 332 mg/dL — ABNORMAL HIGH (ref 70–99)

## 2020-07-29 LAB — FERRITIN: Ferritin: 4094 ng/mL — ABNORMAL HIGH (ref 11–307)

## 2020-07-29 LAB — MAGNESIUM: Magnesium: 2.5 mg/dL — ABNORMAL HIGH (ref 1.7–2.4)

## 2020-07-29 LAB — D-DIMER, QUANTITATIVE: D-Dimer, Quant: 2.05 ug/mL-FEU — ABNORMAL HIGH (ref 0.00–0.50)

## 2020-07-29 LAB — C-REACTIVE PROTEIN: CRP: 14.5 mg/dL — ABNORMAL HIGH (ref <1.0)

## 2020-07-29 LAB — PROCALCITONIN: Procalcitonin: 0.37 ng/mL

## 2020-07-29 MED ORDER — INSULIN ASPART 100 UNIT/ML ~~LOC~~ SOLN
8.0000 [IU] | Freq: Three times a day (TID) | SUBCUTANEOUS | Status: DC
  Administered 2020-07-29 – 2020-08-07 (×29): 8 [IU] via SUBCUTANEOUS

## 2020-07-29 MED ORDER — DOCUSATE SODIUM 100 MG PO CAPS
100.0000 mg | ORAL_CAPSULE | Freq: Two times a day (BID) | ORAL | Status: DC
  Administered 2020-07-29 – 2020-08-07 (×17): 100 mg via ORAL
  Filled 2020-07-29 (×17): qty 1

## 2020-07-29 MED ORDER — INSULIN ASPART 100 UNIT/ML ~~LOC~~ SOLN: 6.0000 [IU] | Freq: Three times a day (TID) | SUBCUTANEOUS | Status: DC

## 2020-07-29 NOTE — Progress Notes (Signed)
Maria Duncan  QMV:[id number] DOB: 1966/05/14 DOA: 07/27/2020 PCP: Pcp, No    Brief Narrative:  55 year old with a history of HTN, obesity, pulmonary hypertension, OSA/OHS on CPAP, and combined systolic/diastolic CHF who presented to the ED with worsening shortness of breath.  She was diagnosed with Covid 12/28.  Since that time she has had progressively worsening shortness of breath, and some intermittent diarrhea.  In the ED she was found to be hypoxic and required 10 L oxygen support to keep saturations at 90% or better.  CXR noted extensive bilateral pulmonary infiltrates.  Significant Events:  12/28 diagnosed with Covid 1/3 admit via  1/3 lower extremity venous duplex negative for DVT bilaterally  Date of Positive COVID Test:  12/28  Vaccination Status: Unvaccinated  COVID-19 specific Treatment: Remdesivir 1/3 > Solu-Medrol 1/3 > Actemra 1/3  Antimicrobials:  Rocephin Azithromycin  DVT prophylaxis: Lovenox  Subjective: Continues to require heated high flow nasal cannula at 80% FiO2 and 30 L of flow.  Afebrile.  Vital signs otherwise stable though patient is somewhat tachypneic.  CBGs are trending upward.  Creatinine has improved slightly.  Inflammatory markers remain elevated though a downward trend has begun.  The patient herself remains alert and conversant.  She tells me that she feels she is better today.  She denies chest pain nausea or vomiting and states she is less short of breath.  Assessment & Plan:  Severe COVID Pneumonia - acute hypoxic respiratory failure Continue Solu-Medrol and Remdesivir -has been dosed with Actemra -patient at high risk of further decline which would necessitate immediate intubation/mechanical ventilation therefore remains appropriate for ICU bed -continue in attempt to improve respiratory status with diuresis -incentive spirometry -prone as needed  Recent Labs  Lab 07/23/20 1202 07/27/20 0150 07/28/20 0357 07/29/20 0350  DDIMER   --  3.89* 2.72* 2.05*  FERRITIN  --  4,546* 4,308* 4,094*  CRP  --  23.9* 21.5* 14.5*  ALT 50* 138* 102* 77*  PROCALCITON  --  0.83  --  0.37    Possible bacterial pneumonia Modestly elevated procalcitonin -continue empiric antibiotic therapy for now  Markedly elevated D-dimer No evidence of DVT on venous duplex -may very well simply be due to active Covid infection though her risk of PE is also quite high -continue full anticoagulation for now but if D-dimer continues to trend downward will consider transitioning back to prophylactic dose only 1/6  Transaminitis Likely due to Covid itself -LFTs trending downward -continue to monitor closely while on Remdesivir  Recent Labs  Lab 07/23/20 1202 07/27/20 0150 07/28/20 0357 07/29/20 0350  AST 77* 199* 81* 43*  ALT 50* 138* 102* 77*  ALKPHOS 58 100 84 94  BILITOT 0.7 1.0 0.9 0.9  PROT 7.3 7.2 6.8 6.7  ALBUMIN 3.1* 2.5* 2.3* 2.3*    Acute renal failure  Potentially direct effect of Covid itself - improving coincident with diuresis -continue same and follow  Recent Labs  Lab 07/23/20 1202 07/27/20 0150 07/28/20 0357 07/29/20 0350  CREATININE 1.34* 2.04* 2.02* 1.84*    OSA/OHS On nightly CPAP at home but unable to tolerate in hospital due to hypoxia  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  DM2 A1c 7.5 -does not apear to require medical therapy as an outpatient -  SSI in setting of high dose steroid admin    Code Status: FULL CODE Family Communication:  Status is: Inpatient  Remains inpatient appropriate because:Inpatient level of care appropriate due to severity of illness   Dispo: The patient is from: Home              Anticipated d/c is to: Home              Anticipated d/c date is: > 3  days              Patient currently is not medically stable to d/c.   Consultants:  none  Objective: Blood pressure (!) 151/87, pulse 62, temperature (!) 97.1 F (36.2 C), temperature source Axillary, resp. rate (!) 24, SpO2 (!) 83 %.  Intake/Output Summary (Last 24 hours) at 07/29/2020 0907 Last data filed at 07/29/2020 0600 Gross per 24 hour  Intake 1680.1 ml  Output 1600 ml  Net 80.1 ml   There were no vitals filed for this visit.  Examination: General: not in extremis but requiring high-level oxygen support -alert and oriented Lungs: Fine crackles diffusely without change -no wheezing Cardiovascular: RRR without murmur or rub Abdomen: Overweight, soft, BS positive, no mass, no rebound Extremities: Trace edema bilateral lower extremities without significant change   CBC: Recent Labs  Lab 07/27/20 0150 07/28/20 0357 07/28/20 1200 07/29/20 0350  WBC 11.0* 14.4*  --  20.1*  NEUTROABS 8.4* 10.7*  --  15.9*  HGB 12.5 12.7 12.6 12.2  HCT 37.3 38.3 37.0 37.3  MCV 86.5 87.4  --  87.1  PLT 221 261  --  296   Basic Metabolic Panel: Recent Labs  Lab 07/27/20 0150 07/28/20 0357 07/28/20 1200 07/29/20 0350  NA 131* 132* 132* 131*  K 5.0 4.6 4.2 4.4  CL 93* 97*  --  95*  CO2 25 22  --  24  GLUCOSE 139* 218*  --  349*  BUN 40* 53*  --  53*  CREATININE 2.04* 2.02*  --  1.84*  CALCIUM 8.8* 8.9  --  9.0  MG  --  2.8*  --  2.5*   GFR: Estimated Creatinine Clearance: 42.7 mL/min (A) (by C-G formula based on SCr of 1.84 mg/dL (H)).  Liver Function Tests: Recent Labs  Lab 07/23/20 1202 07/27/20 0150 07/28/20 0357 07/29/20 0350  AST 77* 199* 81* 43*  ALT 50* 138* 102* 77*  ALKPHOS 58 100 84 94  BILITOT 0.7 1.0 0.9 0.9  PROT 7.3 7.2 6.8 6.7  ALBUMIN 3.1* 2.5* 2.3* 2.3*    HbA1C: Hgb A1c MFr Bld  Date/Time Value Ref Range Status  07/28/2020 03:57 AM 7.5 (H) 4.8 - 5.6 % Final    Comment:    (NOTE) Pre diabetes:          5.7%-6.4%  Diabetes:               >6.4%  Glycemic control for   <7.0% adults with diabetes   12/08/2019 04:35 AM 7.1 (H) 4.8 - 5.6 % Final    Comment:    (NOTE) Pre diabetes:          5.7%-6.4% Diabetes:              >6.4% Glycemic control for   <7.0% adults with diabetes     CBG: Recent Labs  Lab 07/28/20 1213 07/28/20 1614 07/28/20 2016 07/28/20 2315 07/29/20 0717  GLUCAP 313* 265* 307* 350* 299*    Recent Results (from the past 240  hour(s))  Novel Coronavirus, NAA ([facility])     Status: Abnormal   Collection Time: 07/21/20 12:19 PM   Specimen: Nasopharyngeal(NP) swabs in vial transport medium  Result Value Ref Range Status   SARS-CoV-2, NAA Detected (A) Not Detected Final    Comment: Patients who have a positive COVID-19 test result may now have treatment options. Treatment options are available for patients with mild to moderate symptoms and for hospitalized patients. Visit our website at [email or web address] for resources and information. This nucleic acid amplification test was developed and its performance characteristics determined by [employer]. Nucleic acid amplification tests include RT-PCR and TMA. This test has not been FDA cleared or approved. This test has been authorized by FDA under an Emergency Use Authorization (EUA). This test is only authorized for the duration of time the declaration that circumstances exist justifying the authorization of the emergency use of in vitro diagnostic tests for detection of SARS-CoV-2 virus and/or diagnosis of COVID-19 infection under section 564(b)(1) of the Act, 21 U.S.C. 161WRU-0(A) (1), unless the authorization is terminated or revoked sooner. When diagnostic testing is negativ e, the possibility of a false negative result should be considered in the context of a patient's recent exposures and the presence of clinical signs and symptoms consistent with COVID-19. An individual without symptoms of COVID-19 and who is not  shedding SARS-CoV-2 virus would expect to have a negative (not detected) result in this assay.   SARS-COV-2, NAA 2 DAY TAT     Status: None   Collection Time: 07/21/20 12:19 PM  Result Value Ref Range Status   SARS-CoV-2, NAA 2 DAY TAT Performed  Final  Blood Culture (routine x 2)     Status: None (Preliminary result)   Collection Time: 07/27/20  1:50 AM   Specimen: BLOOD LEFT ARM  Result Value Ref Range Status   Specimen Description BLOOD LEFT ARM  Final   Special Requests   Final    BOTTLES DRAWN AEROBIC AND ANAEROBIC Blood Culture results may not be optimal due to an inadequate volume of blood received in culture bottles   Culture   Final    NO GROWTH 2 DAYS Performed at [facility], [place] [address] [place], [place] [address]    Report Status PENDING  Incomplete  MRSA PCR Screening     Status: None   Collection Time: 07/28/20  4:47 PM   Specimen: Nasal Mucosa; Nasopharyngeal  Result Value Ref Range Status   MRSA by PCR NEGATIVE NEGATIVE Final    Comment:        The GeneXpert MRSA Assay (FDA approved for NASAL specimens only), is one component of a comprehensive MRSA colonization surveillance program. It is not intended to diagnose MRSA infection nor to guide or monitor treatment for MRSA infections. Performed at [facility], [place] [address] [place], [place] [address]      Scheduled Meds: . vitamin C  500 mg Oral Daily  . aspirin EC  81 mg Oral Daily  . carvedilol  6.25 mg Oral BID WC  . enoxaparin (LOVENOX) injection  110 mg Subcutaneous Q12H  . furosemide  60 mg Intravenous Q12H  . insulin aspart  0-20 Units Subcutaneous TID WC  . insulin aspart  0-5 Units Subcutaneous QHS  . insulin aspart  3 Units Subcutaneous TID WC  . methylPREDNISolone (SOLU-MEDROL) injection  125 mg Intravenous Q12H  . senna  1 tablet Oral QHS  . spironolactone  25 mg Oral Daily  . zinc sulfate  220 mg Oral Daily   Continuous Infusions: . sodium chloride 75 mL/hr at  07/29/20 0600  . azithromycin 500 mg (07/29/20 6384)  . cefTRIAXone (ROCEPHIN)  IV 2 g (07/29/20 0506)  . remdesivir 100 mg in NS 100 mL Stopped (07/28/20 0935)     LOS: 2 days   Lonia Blood, MD [facility] Office  [phone] Pager - Text Page per Amion  If 7PM-7AM, please contact night-coverage per Amion 07/29/2020, 9:07 AM

## 2020-07-29 NOTE — Progress Notes (Signed)
Inpatient Diabetes Program Recommendations  AACE/ADA: New Consensus Statement on Inpatient Glycemic Control (2015)  Target Ranges:  Prepandial:   less than 140 mg/dL      Peak postprandial:   less than 180 mg/dL (1-2 hours)      Critically ill patients:  140 - 180 mg/dL   Lab Results  Component Value Date   GLUCAP 275 (H) 07/29/2020   HGBA1C 7.5 (H) 07/28/2020    Review of Glycemic Control  Diabetes history: DM2 Outpatient Diabetes medications: Farxiga 10 mg QD Current orders for Inpatient glycemic control: Novolog 0-20 units tidwc and 0-5 units QHS + 8 units tidwc  HgbA1C - 7.5% On Solumedrol 125 mg Q12H Steroid-induced hyperglycemia  Inpatient Diabetes Program Recommendations:     Add Levemir 10 units bid  Will continue to follow. If FBS > 180 mg/dL, titrate Levemir dose.   Thank you. Ailene Ards, RD, LDN, CDE Inpatient Diabetes Coordinator [phone]

## 2020-07-30 DIAGNOSIS — J1282 Pneumonia due to coronavirus disease 2019: Secondary | ICD-10-CM

## 2020-07-30 DIAGNOSIS — E1165 Type 2 diabetes mellitus with hyperglycemia: Secondary | ICD-10-CM

## 2020-07-30 DIAGNOSIS — I5043 Acute on chronic combined systolic (congestive) and diastolic (congestive) heart failure: Secondary | ICD-10-CM

## 2020-07-30 LAB — CBC WITH DIFFERENTIAL/PLATELET
Abs Immature Granulocytes: 0 10*3/uL (ref 0.00–0.07)
Basophils Absolute: 0 10*3/uL (ref 0.0–0.1)
Basophils Relative: 0 %
Eosinophils Absolute: 0 10*3/uL (ref 0.0–0.5)
Eosinophils Relative: 0 %
HCT: 37.5 % (ref 36.0–46.0)
Hemoglobin: 12.2 g/dL (ref 12.0–15.0)
Lymphocytes Relative: 3 %
Lymphs Abs: 0.6 10*3/uL — ABNORMAL LOW (ref 0.7–4.0)
MCH: 28 pg (ref 26.0–34.0)
MCHC: 32.5 g/dL (ref 30.0–36.0)
MCV: 86.2 fL (ref 80.0–100.0)
Monocytes Absolute: 0.6 10*3/uL (ref 0.1–1.0)
Monocytes Relative: 3 %
Neutro Abs: 19.6 10*3/uL — ABNORMAL HIGH (ref 1.7–7.7)
Neutrophils Relative %: 94 %
Platelets: 396 10*3/uL (ref 150–400)
RBC: 4.35 MIL/uL (ref 3.87–5.11)
RDW: 13.6 % (ref 11.5–15.5)
WBC: 20.8 10*3/uL — ABNORMAL HIGH (ref 4.0–10.5)
nRBC: 0.7 % — ABNORMAL HIGH (ref 0.0–0.2)
nRBC: 1 /100 WBC — ABNORMAL HIGH

## 2020-07-30 LAB — MAGNESIUM: Magnesium: 2.3 mg/dL (ref 1.7–2.4)

## 2020-07-30 LAB — COMPREHENSIVE METABOLIC PANEL
ALT: 61 U/L — ABNORMAL HIGH (ref 0–44)
AST: 35 U/L (ref 15–41)
Albumin: 2.4 g/dL — ABNORMAL LOW (ref 3.5–5.0)
Alkaline Phosphatase: 105 U/L (ref 38–126)
Anion gap: 14 (ref 5–15)
BUN: 55 mg/dL — ABNORMAL HIGH (ref 6–20)
CO2: 24 mmol/L (ref 22–32)
Calcium: 9.2 mg/dL (ref 8.9–10.3)
Chloride: 97 mmol/L — ABNORMAL LOW (ref 98–111)
Creatinine, Ser: 1.8 mg/dL — ABNORMAL HIGH (ref 0.44–1.00)
GFR, Estimated: 33 mL/min — ABNORMAL LOW (ref >60)
Glucose, Bld: 367 mg/dL — ABNORMAL HIGH (ref 70–99)
Potassium: 4.5 mmol/L (ref 3.5–5.1)
Sodium: 135 mmol/L (ref 135–145)
Total Bilirubin: 0.8 mg/dL (ref 0.3–1.2)
Total Protein: 6.7 g/dL (ref 6.5–8.1)

## 2020-07-30 LAB — FERRITIN: Ferritin: 2737 ng/mL — ABNORMAL HIGH (ref 11–307)

## 2020-07-30 LAB — GLUCOSE, CAPILLARY
Glucose-Capillary: 359 mg/dL — ABNORMAL HIGH (ref 70–99)
Glucose-Capillary: 314 mg/dL — ABNORMAL HIGH (ref 70–99)
Glucose-Capillary: 290 mg/dL — ABNORMAL HIGH (ref 70–99)
Glucose-Capillary: 318 mg/dL — ABNORMAL HIGH (ref 70–99)

## 2020-07-30 LAB — C-REACTIVE PROTEIN: CRP: 10.6 mg/dL — ABNORMAL HIGH (ref <1.0)

## 2020-07-30 LAB — D-DIMER, QUANTITATIVE: D-Dimer, Quant: 1.99 ug/mL-FEU — ABNORMAL HIGH (ref 0.00–0.50)

## 2020-07-30 LAB — PROCALCITONIN: Procalcitonin: 0.27 ng/mL

## 2020-07-30 MED ORDER — ENOXAPARIN SODIUM 60 MG/0.6ML ~~LOC~~ SOLN
0.5000 mg/kg | SUBCUTANEOUS | Status: DC
  Administered 2020-07-31 – 2020-08-07 (×8): 55 mg via SUBCUTANEOUS
  Filled 2020-07-30 (×8): qty 0.6

## 2020-07-30 MED ORDER — FUROSEMIDE 10 MG/ML IJ SOLN
60.0000 mg | Freq: Two times a day (BID) | INTRAMUSCULAR | Status: DC
  Administered 2020-07-31: 60 mg via INTRAVENOUS
  Filled 2020-07-30: qty 6

## 2020-07-30 MED ORDER — CHLORHEXIDINE GLUCONATE CLOTH 2 % EX PADS
6.0000 | MEDICATED_PAD | Freq: Every day | CUTANEOUS | Status: DC
  Administered 2020-07-30 – 2020-08-03 (×5): 6 via TOPICAL

## 2020-07-30 MED ORDER — ALUM & MAG HYDROXIDE-SIMETH 200-200-20 MG/5ML PO SUSP: 30.0000 mL | Freq: Four times a day (QID) | ORAL | Status: DC | PRN

## 2020-07-30 MED ORDER — MOMETASONE FURO-FORMOTEROL FUM 100-5 MCG/ACT IN AERO
2.0000 | INHALATION_SPRAY | Freq: Two times a day (BID) | RESPIRATORY_TRACT | Status: DC
  Administered 2020-07-30 – 2020-08-07 (×14): 2 via RESPIRATORY_TRACT
  Filled 2020-07-30 (×2): qty 8.8

## 2020-07-30 MED ORDER — INSULIN DETEMIR 100 UNIT/ML ~~LOC~~ SOLN
10.0000 [IU] | Freq: Two times a day (BID) | SUBCUTANEOUS | Status: DC
  Administered 2020-07-30 – 2020-07-31 (×3): 10 [IU] via SUBCUTANEOUS
  Filled 2020-07-30 (×4): qty 0.1

## 2020-07-30 MED ORDER — FAMOTIDINE 20 MG PO TABS
20.0000 mg | ORAL_TABLET | Freq: Every day | ORAL | Status: DC
  Administered 2020-07-30 – 2020-08-07 (×9): 20 mg via ORAL
  Filled 2020-07-30 (×9): qty 1

## 2020-07-30 NOTE — Plan of Care (Signed)
  Problem: Education: Goal: Knowledge of General Education information will improve Description Including pain rating scale, medication(s)/side effects and non-pharmacologic comfort measures Outcome: Progressing   Problem: Health Behavior/Discharge Planning: Goal: Ability to manage health-related needs will improve Outcome: Progressing   

## 2020-07-30 NOTE — Progress Notes (Signed)
ANTICOAGULATION CONSULT NOTE   Pharmacy Consult for Enoxaparin Indication: Change from Full Dose to Prophylaxis  Allergies  Allergen Reactions  . Hyzaar [Losartan Potassium-Hctz] Nausea And Vomiting and Cough    Patient can take Entresto now    Patient Measurements:    Vital Signs: Temp: 97.6 F (36.4 C) (01/06 0719) Temp Source: Oral (01/06 0719) BP: 159/107 (01/06 0800) Pulse Rate: 63 (01/06 0849)  Labs: Recent Labs    07/28/20 0357 07/28/20 1200 07/29/20 0350 07/30/20 0225  HGB 12.7 12.6 12.2 12.2  HCT 38.3 37.0 37.3 37.5  PLT 261  --  296 396  CREATININE 2.02*  --  1.84* 1.80*    Estimated Creatinine Clearance: 43.7 mL/min (A) (by C-G formula based on SCr of 1.8 mg/dL (H)).   Medical History: Past Medical History:  Diagnosis Date  . 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 Chest pain of uncertain etiology, non obstructive CAD, pain due to acute HF 12/12/2019  . CHF (congestive heart failure) (HCC)   . Diabetes mellitus without complication (HCC)   . Hypertension   . Hypoventilation associated with obesity (HCC) 12/12/2019  . Morbid obesity (HCC)   . NICM (nonischemic cardiomyopathy) (HCC)   . OSA (obstructive sleep apnea) 12/12/2019  . Pulmonary hypertension, unspecified (HCC) 12/12/2019   Assessment: 55 yo female presented on 07/27/2020 with shortness of breath. Recently diagnosed with COVID-19 on 07/21/2020.  Patient has been on therapeutic dose of Lovenox for rule out DVT and elevated D-dimer. LE Dopplers and VQ scan were negative. D-dimer is trending down. Pharmacy consulted to change back to therapeutic dosing per COVID algorithm for BMI >35.   Last dose of Lovenox 110mg  given this morning at 0903AM. SCr is trending back down at 1.80 with CrCl ~44 mL/min.   Goal of Therapy:  Anti-Xa level 0.6-1 units/ml 4hrs after LMWH dose given Monitor platelets by anticoagulation protocol: Yes   Plan:  Reduce enoxaparin to 55 mg (0.5mg /kg)  SQ every 24 hours starting 1/7.  Pharmacy will sign off consult - please re-consult if needed.   3/7, PharmD, BCPS, BCCCP Clinical Pharmacist Please refer to [facility] for [facility] numbers 07/30/2020,12:36 PM

## 2020-07-30 NOTE — Progress Notes (Signed)
Maria Duncan  ERX:[id number] DOB: 22-Jan-1966 DOA: 07/27/2020 PCP: Pcp, No    Brief Narrative:  55 year old with a history of HTN, obesity, pulmonary hypertension, OSA/OHS on CPAP, and combined systolic/diastolic CHF who presented to the ED with worsening shortness of breath.  She was diagnosed with Covid 12/28.  Since that time she has had progressively worsening shortness of breath, and some intermittent diarrhea.  In the ED she was found to be hypoxic and required 10 L oxygen support to keep saturations at 90% or better.  CXR noted extensive bilateral pulmonary infiltrates.  Significant Events:  12/28 diagnosed with Covid 1/3 admit via [place] 1/3 lower extremity venous duplex negative for DVT bilaterally  Date of Positive COVID Test:  12/28  Vaccination Status: Unvaccinated  COVID-19 specific Treatment: Remdesivir 1/3 > Solu-Medrol 1/3 > Actemra 1/3  Antimicrobials:  Rocephin Azithromycin  DVT prophylaxis: Lovenox  Subjective: Patient mentions that she is feeling slightly better today compared to yesterday.  However continues to have significant shortness of breath even with minimal exertion.  Continues to have a dry cough.  No chest pain.  Some nausea overnight.   Assessment & Plan:  Acute Hypoxic Resp. Failure/Pneumonia due to COVID-19   Recent Labs  Lab 07/23/20 1202 07/27/20 0150 07/28/20 0357 07/29/20 0350 07/30/20 0225  DDIMER  --  3.89* 2.72* 2.05* 1.99*  FERRITIN  --  4,546* 4,308* 4,094* 2,737*  CRP  --  23.9* 21.5* 14.5* 10.6*  ALT 50* 138* 102* 77* 61*  PROCALCITON  --  0.83  --  0.37 0.27    Objective findings: Fever: Noted to be afebrile Oxygen requirements: Remains on heated high flow at 40 L/min, 80% FiO2.  Also noted to be on nonrebreather.  Saturations in the late 80s to early 90s.  COVID 19 Therapeutics: Antibacterials: Patient noted to be on ceftriaxone and azithromycin.  Procalcitonin noted to be elevated at 0.83.  Improved to  0.27. Remdesivir: Day 4 today Steroids: Solu-Medrol Diuretics: Lasix twice daily Inhaled Steroids: Will initiate Dulera Actemra Patient was given Actemra PUD Prophylaxis: Initiate famotidine DVT Prophylaxis: Noted to be on therapeutic Lovenox.  From a respiratory standpoint patient remains tenuous.  Requiring heated high flow oxygen.  Patient remains on Remdesivir steroids.  She was given Actemra.  Also on antibacterials for superimposed bacterial infection.  She will complete a 5-day course of same.  Continue to mobilize, incentive spirometry, prone positioning as much as tolerated.  Patient also on diuretics which will be continued.  Add inhaled steroids as well.  CRP noted to be improving.  D-dimer has stabilized.  This is most likely due to COVID-19.  Lower extremity Doppler studies did not show any DVTs.  Due to elevated D-dimer and she did not undergo CT scan.  We will change her back to prophylactic dose of Lovenox.  The treatment plan and use of medications and known side effects were discussed with patient/family. Some of the medications used are based on case reports/anecdotal data.  All other medications being used in the management of COVID-19 based on limited study data.  Complete risks and long-term side effects are unknown, however in the best clinical judgment they seem to be of some benefit.  Patient/family wanted to proceed with treatment options provided.  Transaminitis Secondary to COVID-19.  Noted to be improving.    Acute renal failure  Baseline creatinine around 1.2.  Presented with a creatinine of 2.04.  Likely due to combination of acute infection and volume overload.  Monitor urine  output.  Renal function stable.  Patient on diuretics.    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  Followed in the CHF clinic.  Continue IV diuretics.   Holding [place] and [place] in the setting of acute renal failure.  Obesity Estimated body mass index is 39.61 kg/m as calculated from the following:   Height as of 07/23/20: 5\' 5"  (1.651 m).   Weight as of 07/23/20: 108 kg.  Diabetes mellitus type 2, uncontrolled with hyperglycemia HbA1c 7.5.  Elevated CBGs due to steroids.  We will place her on long-acting insulin.  Continue SSI   DVT prophylaxis: On Lovenox.  Will change to prophylactic dose. Code Status: FULL CODE Family Communication: We will call her sister Disposition: Hopefully return home in improved  Status is: Inpatient  Remains inpatient appropriate because:Inpatient level of care appropriate due to severity of illness   Dispo: The patient is from: Home              Anticipated d/c is to: Home              Anticipated d/c date is: > 3 days              Patient currently is not medically stable to d/c.   Consultants:  none  Objective: Blood pressure (!) 138/100, pulse 63, temperature 97.6 F (36.4 C), temperature source Oral, resp. rate 16, SpO2 95 %.  Intake/Output Summary (Last 24 hours) at 07/30/2020 1132 Last data filed at 07/30/2020 09/27/2020 Gross per 24 hour  Intake 579.52 ml  Output 1555 ml  Net -975.48 ml   There were no vitals filed for this visit.   Examination:  General appearance: Awake alert.  In no distress Resp: Noted to be tachypneic.  No use of accessory muscles.  Crackles bilateral bases.  No wheezing or rhonchi  Cardio: S1-S2 is normal regular.  No S3-S4.  No rubs murmurs or bruit GI: Abdomen is soft.  Nontender nondistended.  Bowel sounds are present normal.  No masses organomegaly Extremities: No edema.  Full range of motion of lower extremities. Neurologic: Alert and oriented x3.  No focal neurological deficits.     CBC: Recent Labs  Lab 07/28/20 0357 07/28/20 1200 07/29/20 0350 07/30/20 0225  WBC 14.4*  --  20.1* 20.8*  NEUTROABS 10.7*  --  15.9* 19.6*  HGB 12.7 12.6 12.2 12.2   HCT 38.3 37.0 37.3 37.5  MCV 87.4  --  87.1 86.2  PLT 261  --  296 396   Basic Metabolic Panel: Recent Labs  Lab 07/28/20 0357 07/28/20 1200 07/29/20 0350 07/30/20 0225  NA 132* 132* 131* 135  K 4.6 4.2 4.4 4.5  CL 97*  --  95* 97*  CO2 22  --  24 24  GLUCOSE 218*  --  349* 367*  BUN 53*  --  53* 55*  CREATININE 2.02*  --  1.84* 1.80*  CALCIUM 8.9  --  9.0 9.2  MG 2.8*  --  2.5* 2.3   GFR: Estimated Creatinine Clearance: 43.7 mL/min (A) (by C-G formula based on SCr of 1.8 mg/dL (H)).  Liver Function Tests: Recent Labs  Lab 07/27/20 0150 07/28/20 0357 07/29/20 0350 07/30/20 0225  AST 199* 81* 43* 35  ALT 138* 102* 77* 61*  ALKPHOS 100 84 94 105  BILITOT 1.0 0.9 0.9 0.8  PROT 7.2  6.8 6.7 6.7  ALBUMIN 2.5* 2.3* 2.3* 2.4*    HbA1C: Hgb A1c MFr Bld  Date/Time Value Ref Range Status  07/28/2020 03:57 AM 7.5 (H) 4.8 - 5.6 % Final    Comment:    (NOTE) Pre diabetes:          5.7%-6.4%  Diabetes:              >6.4%  Glycemic control for   <7.0% adults with diabetes   12/08/2019 04:35 AM 7.1 (H) 4.8 - 5.6 % Final    Comment:    (NOTE) Pre diabetes:          5.7%-6.4% Diabetes:              >6.4% Glycemic control for   <7.0% adults with diabetes     CBG: Recent Labs  Lab 07/29/20 0717 07/29/20 1102 07/29/20 1612 07/29/20 2013 07/30/20 0715  GLUCAP 299* 297* 275* 332* 359*    Recent Results (from the past 240 hour(s))  Novel Coronavirus, NAA ([facility])     Status: Abnormal   Collection Time: 07/21/20 12:19 PM   Specimen: Nasopharyngeal(NP) swabs in vial transport medium  Result Value Ref Range Status   SARS-CoV-2, NAA Detected (A) Not Detected Final    Comment: Patients who have a positive COVID-19 test result may now have treatment options. Treatment options are available for patients with mild to moderate symptoms and for hospitalized patients. Visit our website at [email or web address] for resources and information. This nucleic  acid amplification test was developed and its performance characteristics determined by [employer]. Nucleic acid amplification tests include RT-PCR and TMA. This test has not been FDA cleared or approved. This test has been authorized by FDA under an Emergency Use Authorization (EUA). This test is only authorized for the duration of time the declaration that circumstances exist justifying the authorization of the emergency use of in vitro diagnostic tests for detection of SARS-CoV-2 virus and/or diagnosis of COVID-19 infection under section 564(b)(1) of the Act, 21 U.S.C. 443XVQ-0(G) (1), unless the authorization is terminated or revoked sooner. When diagnostic testing is negativ e, the possibility of a false negative result should be considered in the context of a patient's recent exposures and the presence of clinical signs and symptoms consistent with COVID-19. An individual without symptoms of COVID-19 and who is not shedding SARS-CoV-2 virus would expect to have a negative (not detected) result in this assay.   SARS-COV-2, NAA 2 DAY TAT     Status: None   Collection Time: 07/21/20 12:19 PM  Result Value Ref Range Status   SARS-CoV-2, NAA 2 DAY TAT Performed  Final  Blood Culture (routine x 2)     Status: None (Preliminary result)   Collection Time: 07/27/20  1:50 AM   Specimen: BLOOD LEFT ARM  Result Value Ref Range Status   Specimen Description BLOOD LEFT ARM  Final   Special Requests   Final    BOTTLES DRAWN AEROBIC AND ANAEROBIC Blood Culture results may not be optimal due to an inadequate volume of blood received in culture bottles   Culture   Final    NO GROWTH 3 DAYS Performed at [facility], [address], [place], [place] [address]    Report Status PENDING  Incomplete  MRSA PCR Screening     Status: None   Collection Time: 07/28/20  4:47 PM   Specimen: Nasal Mucosa; Nasopharyngeal  Result Value Ref Range Status   MRSA by PCR NEGATIVE NEGATIVE Final  Comment:        The GeneXpert MRSA Assay (FDA approved for NASAL specimens only), is one component of a comprehensive MRSA colonization surveillance program. It is not intended to diagnose MRSA infection nor to guide or monitor treatment for MRSA infections. Performed at [facility], [place] [address], [place], [place] [address]      Scheduled Meds: . vitamin C  500 mg Oral Daily  . aspirin EC  81 mg Oral Daily  . carvedilol  6.25 mg Oral BID WC  . Chlorhexidine Gluconate Cloth  6 each Topical Daily  . docusate sodium  100 mg Oral BID  . enoxaparin (LOVENOX) injection  110 mg Subcutaneous Q12H  . furosemide  60 mg Intravenous Q12H  . insulin aspart  0-20 Units Subcutaneous TID WC  . insulin aspart  0-5 Units Subcutaneous QHS  . insulin aspart  8 Units Subcutaneous TID WC  . methylPREDNISolone (SOLU-MEDROL) injection  125 mg Intravenous Q12H  . senna  1 tablet Oral QHS  . spironolactone  25 mg Oral Daily  . zinc sulfate  220 mg Oral Daily   Continuous Infusions: . sodium chloride 75 mL/hr at 07/29/20 0600  . azithromycin 500 mg (07/30/20 0175)  . cefTRIAXone (ROCEPHIN)  IV Stopped (07/30/20 0533)  . remdesivir 100 mg in NS 100 mL 100 mg (07/30/20 0858)     LOS: 3 days   [facility] Office  [phone] Pager - Text Page per Shea Evans  If 7PM-7AM, please contact night-coverage per Amion 07/30/2020, 11:32 AM

## 2020-07-30 NOTE — Progress Notes (Signed)
Inpatient Diabetes Program Recommendations  AACE/ADA: New Consensus Statement on Inpatient Glycemic Control (2015)  Target Ranges:  Prepandial:   less than 140 mg/dL      Peak postprandial:   less than 180 mg/dL (1-2 hours)      Critically ill patients:  140 - 180 mg/dL   Lab Results  Component Value Date   GLUCAP 359 (H) 07/30/2020   HGBA1C 7.5 (H) 07/28/2020    Review of Glycemic Control  Diabetes history: DM2 Outpatient Diabetes medications: Farxiga 10 mg QD Current orders for Inpatient glycemic control: Novolog 0-20 units tidwc and hs + 8 units tidwc  Needs basal insulin.  Inpatient Diabetes Program Recommendations:     Add Levemir 10 units bid  Will continue to follow.  Thank you. Ailene Ards, RD, LDN, CDE Inpatient Diabetes Coordinator [phone]

## 2020-07-31 ENCOUNTER — Inpatient Hospital Stay (HOSPITAL_COMMUNITY): Payer: HRSA Program

## 2020-07-31 LAB — CBC WITH DIFFERENTIAL/PLATELET
Abs Immature Granulocytes: 2.48 10*3/uL — ABNORMAL HIGH (ref 0.00–0.07)
Basophils Absolute: 0.1 10*3/uL (ref 0.0–0.1)
Basophils Relative: 1 %
Eosinophils Absolute: 0 10*3/uL (ref 0.0–0.5)
Eosinophils Relative: 0 %
HCT: 39.9 % (ref 36.0–46.0)
Hemoglobin: 12.8 g/dL (ref 12.0–15.0)
Immature Granulocytes: 10 %
Lymphocytes Relative: 8 %
Lymphs Abs: 2.1 10*3/uL (ref 0.7–4.0)
MCH: 28.1 pg (ref 26.0–34.0)
MCHC: 32.1 g/dL (ref 30.0–36.0)
MCV: 87.5 fL (ref 80.0–100.0)
Monocytes Absolute: 0.8 10*3/uL (ref 0.1–1.0)
Monocytes Relative: 3 %
Neutro Abs: 19.9 10*3/uL — ABNORMAL HIGH (ref 1.7–7.7)
Neutrophils Relative %: 78 %
Platelets: 365 10*3/uL (ref 150–400)
RBC: 4.56 MIL/uL (ref 3.87–5.11)
RDW: 14 % (ref 11.5–15.5)
WBC: 25.5 10*3/uL — ABNORMAL HIGH (ref 4.0–10.5)
nRBC: 0.4 % — ABNORMAL HIGH (ref 0.0–0.2)

## 2020-07-31 LAB — COMPREHENSIVE METABOLIC PANEL
ALT: 49 U/L — ABNORMAL HIGH (ref 0–44)
AST: 28 U/L (ref 15–41)
Albumin: 2.3 g/dL — ABNORMAL LOW (ref 3.5–5.0)
Alkaline Phosphatase: 102 U/L (ref 38–126)
Anion gap: 11 (ref 5–15)
BUN: 48 mg/dL — ABNORMAL HIGH (ref 6–20)
CO2: 26 mmol/L (ref 22–32)
Calcium: 9 mg/dL (ref 8.9–10.3)
Chloride: 100 mmol/L (ref 98–111)
Creatinine, Ser: 1.59 mg/dL — ABNORMAL HIGH (ref 0.44–1.00)
GFR, Estimated: 38 mL/min — ABNORMAL LOW (ref >60)
Glucose, Bld: 276 mg/dL — ABNORMAL HIGH (ref 70–99)
Potassium: 5.1 mmol/L (ref 3.5–5.1)
Sodium: 137 mmol/L (ref 135–145)
Total Bilirubin: 0.7 mg/dL (ref 0.3–1.2)
Total Protein: 6.1 g/dL — ABNORMAL LOW (ref 6.5–8.1)

## 2020-07-31 LAB — GLUCOSE, CAPILLARY
Glucose-Capillary: 280 mg/dL — ABNORMAL HIGH (ref 70–99)
Glucose-Capillary: 281 mg/dL — ABNORMAL HIGH (ref 70–99)
Glucose-Capillary: 351 mg/dL — ABNORMAL HIGH (ref 70–99)
Glucose-Capillary: 333 mg/dL — ABNORMAL HIGH (ref 70–99)

## 2020-07-31 LAB — C-REACTIVE PROTEIN: CRP: 5.9 mg/dL — ABNORMAL HIGH (ref <1.0)

## 2020-07-31 LAB — MAGNESIUM: Magnesium: 2.2 mg/dL (ref 1.7–2.4)

## 2020-07-31 LAB — D-DIMER, QUANTITATIVE: D-Dimer, Quant: 1.98 ug/mL-FEU — ABNORMAL HIGH (ref 0.00–0.50)

## 2020-07-31 MED ORDER — POLYETHYLENE GLYCOL 3350 17 G PO PACK
17.0000 g | PACK | Freq: Every day | ORAL | Status: DC
  Administered 2020-07-31 – 2020-08-05 (×5): 17 g via ORAL
  Filled 2020-07-31 (×5): qty 1

## 2020-07-31 MED ORDER — INSULIN DETEMIR 100 UNIT/ML ~~LOC~~ SOLN
15.0000 [IU] | Freq: Two times a day (BID) | SUBCUTANEOUS | Status: DC
  Administered 2020-07-31 – 2020-08-01 (×2): 15 [IU] via SUBCUTANEOUS
  Filled 2020-07-31 (×3): qty 0.15

## 2020-07-31 MED ORDER — GLUCERNA SHAKE PO LIQD
237.0000 mL | Freq: Three times a day (TID) | ORAL | Status: DC
  Administered 2020-07-31 – 2020-08-07 (×16): 237 mL via ORAL
  Filled 2020-07-31: qty 237
  Filled 2020-07-31: qty 711

## 2020-07-31 MED ORDER — METHYLPREDNISOLONE SODIUM SUCC 125 MG IJ SOLR
100.0000 mg | Freq: Two times a day (BID) | INTRAMUSCULAR | Status: DC
  Administered 2020-07-31 – 2020-08-02 (×4): 100 mg via INTRAVENOUS
  Filled 2020-07-31 (×4): qty 2

## 2020-07-31 MED ORDER — FUROSEMIDE 10 MG/ML IJ SOLN
60.0000 mg | Freq: Every day | INTRAMUSCULAR | Status: DC
  Administered 2020-08-01 – 2020-08-07 (×7): 60 mg via INTRAVENOUS
  Filled 2020-07-31 (×7): qty 6

## 2020-07-31 NOTE — Plan of Care (Signed)
  Problem: Education: Goal: Knowledge of General Education information will improve Description: Including pain rating scale, medication(s)/side effects and non-pharmacologic comfort measures Outcome: Progressing   Problem: Clinical Measurements: Goal: Will remain free from infection Outcome: Progressing Goal: Respiratory complications will improve Outcome: Not Progressing

## 2020-07-31 NOTE — Progress Notes (Signed)
Maria Duncan  UKG:[id number] DOB: [birth year] DOA: 07/27/2020 PCP: Pcp, No    Brief Narrative:  55 year old with a history of HTN, obesity, pulmonary hypertension, OSA/OHS on CPAP, and combined systolic/diastolic CHF who presented to the ED with worsening shortness of breath.  She was diagnosed with Covid 12/28.  Since that time she has had progressively worsening shortness of breath, and some intermittent diarrhea.  In the ED she was found to be hypoxic and required 10 L oxygen support to keep saturations at 90% or better.  CXR noted extensive bilateral pulmonary infiltrates.  Significant Events:  12/28 diagnosed with Covid 1/3 admit via  1/3 lower extremity venous duplex negative for DVT bilaterally  Date of Positive COVID Test:  12/28  Vaccination Status: Unvaccinated  COVID-19 specific Treatment: Remdesivir 1/3 > Solu-Medrol 1/3 > Actemra 1/3  Antimicrobials:  Rocephin Azithromycin  DVT prophylaxis: Lovenox  Subjective: Patient mentions that she is feeling slightly better today compared to yesterday.  Still gets very short of breath with minimal exertion.  Continues to have a dry cough.  Complains of constipation.     Assessment & Plan:  Acute Hypoxic Resp. Failure/Pneumonia due to COVID-19   Recent Labs  Lab 07/27/20 0150 07/28/20 0357 07/29/20 0350 07/30/20 0225 07/31/20 0225  DDIMER 3.89* 2.72* 2.05* 1.99* 1.98*  FERRITIN 4,546* 4,308* 4,094* 2,737*  --   CRP 23.9* 21.5* 14.5* 10.6* 5.9*  ALT 138* 102* 77* 61* 49*  PROCALCITON 0.83  --  0.37 0.27  --     Objective findings: Oxygen requirements: Remains on heated high flow at 30 L/min, 70% FiO2.  Saturations noted to be in the late 80s to early 90s.  No longer on nonrebreather  COVID 19 Therapeutics: Antibacterials: 5-day course of ceftriaxone and azithromycin.  Procalcitonin noted to be elevated at 0.83.  Improved to 0.27. Remdesivir: Day 5 today Steroids: Solu-Medrol Diuretics: Lasix changed to  once daily Inhaled Steroids: Dulera Actemra: Patient was given Actemra PUD Prophylaxis: famotidine DVT Prophylaxis:  Lovenox.  From a respiratory standpoint patient remains tenuous though seems to be slightly better today compared to yesterday.  Her CRP is improving.  D-dimer stable.  We'll complete 5 days of Remdesivir today.  Remains on steroids.  She was also given Actemra.  She is also on a 5-day course of antibacterials.  Continue with incentive spirometry, mobilization, prone positioning as much as possible.  Inhaled steroids were also added.  Leukocytosis is due to steroids.  Patient noted to have elevated D-dimer.  She underwent Doppler studies which did not show any PE.  Due to elevated creatinine she did not undergo CT scan.  Since D-dimer has been improving and is thought to be secondary to COVID-19 we changed her back to the prophylactic dose of Lovenox.  The treatment plan and use of medications and known side effects were discussed with patient/family. Some of the medications used are based on case reports/anecdotal data.  All other medications being used in the management of COVID-19 based on limited study data.  Complete risks and long-term side effects are unknown, however in the best clinical judgment they seem to be of some benefit.  Patient/family wanted to proceed with treatment options provided.  Transaminitis Secondary to COVID-19.  Noted to be improving.    Acute renal failure  Baseline creatinine around 1.2.  Presented with a creatinine of 2.04.  Likely due to combination of acute infection and volume overload.  Seems to be better this morning at 1.59.  Continue to  monitor urine output.    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  Followed in the CHF clinic.  Holding [place] due to renal failure.  Continuing furosemide.   Dose will be changed to once a day.    Obesity Estimated body mass index is 39.61 kg/m as calculated from the following:   Height as of 07/23/20: 5\' 5"  (1.651 m).   Weight as of 07/23/20: 108 kg.  Diabetes mellitus type 2, uncontrolled with hyperglycemia HbA1c 7.5.  Elevated CBGs due to steroids.  She was started on Levemir.  CBGs are better but still high.  Will increase the dose of Levemir.  Continue SSI   DVT prophylaxis: On Lovenox.   Code Status: FULL CODE Family Communication: Sister was updated yesterday Disposition: Hopefully return home when improved  Status is: Inpatient  Remains inpatient appropriate because:Inpatient level of care appropriate due to severity of illness   Dispo: The patient is from: Home              Anticipated d/c is to: Home              Anticipated d/c date is: > 3 days              Patient currently is not medically stable to d/c.   Consultants:  none  Objective: Blood pressure 127/85, pulse 76, temperature 98.1 F (36.7 C), temperature source Oral, resp. rate (!) 27, SpO2 (!) 84 %.  Intake/Output Summary (Last 24 hours) at 07/31/2020 0958 Last data filed at 07/31/2020 0900 Gross per 24 hour  Intake 1497.82 ml  Output 1411 ml  Net 86.82 ml   There were no vitals filed for this visit.   Examination:  General appearance: Awake alert.  In no distress Resp: Mildly tachypneic at rest.  Coarse breath sounds with crackles bilateral bases.  No wheezing or rhonchi.   Cardio: S1-S2 is normal regular.  No S3-S4.  No rubs murmurs or bruit GI: Abdomen is soft.  Nontender nondistended.  Bowel sounds are present normal.  No masses organomegaly Extremities: No edema.  Full range of motion of lower extremities. Neurologic: Alert and oriented x3.  No focal neurological deficits.      CBC: Recent Labs  Lab 07/29/20 0350 07/30/20 0225 07/31/20 0225  WBC 20.1* 20.8* 25.5*  NEUTROABS 15.9* 19.6* 19.9*  HGB 12.2 12.2 12.8  HCT 37.3 37.5 39.9  MCV  87.1 86.2 87.5  PLT 296 396 365   Basic Metabolic Panel: Recent Labs  Lab 07/29/20 0350 07/30/20 0225 07/31/20 0225  NA 131* 135 137  K 4.4 4.5 5.1  CL 95* 97* 100  CO2 24 24 26   GLUCOSE 349* 367* 276*  BUN 53* 55* 48*  CREATININE 1.84* 1.80* 1.59*  CALCIUM 9.0 9.2 9.0  MG 2.5* 2.3 2.2   GFR: Estimated Creatinine Clearance: 49.4 mL/min (A) (by C-G formula based on SCr of 1.59 mg/dL (H)).  Liver Function Tests: Recent Labs  Lab 07/28/20 0357 07/29/20 0350 07/30/20 0225 07/31/20 0225  AST 81* 43* 35 28  ALT 102* 77* 61* 49*  ALKPHOS 84 94 105 102  BILITOT 0.9 0.9 0.8 0.7  PROT 6.8 6.7 6.7 6.1*  ALBUMIN 2.3* 2.3* 2.4* 2.3*    HbA1C: Hgb A1c MFr Bld  Date/Time Value Ref Range Status  07/28/2020 03:57 AM 7.5 (H) 4.8 - 5.6 %  Final    Comment:    (NOTE) Pre diabetes:          5.7%-6.4%  Diabetes:              >6.4%  Glycemic control for   <7.0% adults with diabetes   12/08/2019 04:35 AM 7.1 (H) 4.8 - 5.6 % Final    Comment:    (NOTE) Pre diabetes:          5.7%-6.4% Diabetes:              >6.4% Glycemic control for   <7.0% adults with diabetes     CBG: Recent Labs  Lab 07/29/20 2013 07/30/20 0715 07/30/20 1220 07/30/20 1624 07/30/20 2052  GLUCAP 332* 359* 314* 290* 318*    Recent Results (from the past 240 hour(s))  Novel Coronavirus, NAA ([facility])     Status: Abnormal   Collection Time: 07/21/20 12:19 PM   Specimen: Nasopharyngeal(NP) swabs in vial transport medium  Result Value Ref Range Status   SARS-CoV-2, NAA Detected (A) Not Detected Final    Comment: Patients who have a positive COVID-19 test result may now have treatment options. Treatment options are available for patients with mild to moderate symptoms and for hospitalized patients. Visit our website at [email or web address] for resources and information. This nucleic acid amplification test was developed and its performance characteristics determined by [employer]. Nucleic acid amplification tests include RT-PCR and TMA. This test has not been FDA cleared or approved. This test has been authorized by FDA under an Emergency Use Authorization (EUA). This test is only authorized for the duration of time the declaration that circumstances exist justifying the authorization of the emergency use of in vitro diagnostic tests for detection of SARS-CoV-2 virus and/or diagnosis of COVID-19 infection under section 564(b)(1) of the Act, 21 U.S.C. 161WRU-0(A) (1), unless the authorization is terminated or revoked sooner. When diagnostic testing is negativ e, the possibility of a false negative result should be considered in the context of a patient's recent exposures and the presence of clinical signs and symptoms consistent with COVID-19. An individual without symptoms of COVID-19 and who is not shedding SARS-CoV-2 virus would expect to have a negative (not detected) result in this assay.   SARS-COV-2, NAA 2 DAY TAT     Status: None   Collection Time: 07/21/20 12:19 PM  Result Value Ref Range Status   SARS-CoV-2, NAA 2 DAY TAT Performed  Final  Blood Culture (routine x 2)     Status: None (Preliminary result)   Collection Time: 07/27/20  1:50 AM   Specimen: BLOOD LEFT ARM  Result Value Ref Range Status   Specimen Description BLOOD LEFT ARM  Final   Special Requests   Final    BOTTLES DRAWN AEROBIC AND ANAEROBIC Blood Culture results may not be optimal due to an inadequate volume of blood received in culture bottles   Culture   Final    NO GROWTH 3 DAYS Performed at [facility], [address] [place], [place] [address]    Report Status PENDING  Incomplete  MRSA PCR Screening     Status: None   Collection Time: 07/28/20  4:47 PM   Specimen: Nasal Mucosa; Nasopharyngeal  Result Value Ref Range Status   MRSA by PCR NEGATIVE NEGATIVE Final    Comment:        The GeneXpert MRSA Assay (FDA approved for NASAL specimens only), is one  component of a comprehensive MRSA colonization surveillance program. It  is not intended to diagnose MRSA infection nor to guide or monitor treatment for MRSA infections. Performed at [facility], [address] [place], [place] [address]      Scheduled Meds: . vitamin C  500 mg Oral Daily  . aspirin EC  81 mg Oral Daily  . carvedilol  6.25 mg Oral BID WC  . Chlorhexidine Gluconate Cloth  6 each Topical Daily  . docusate sodium  100 mg Oral BID  . enoxaparin (LOVENOX) injection  0.5 mg/kg Subcutaneous Q24H  . famotidine  20 mg Oral Daily  . furosemide  60 mg Intravenous Q12H  . insulin aspart  0-20 Units Subcutaneous TID WC  . insulin aspart  0-5 Units Subcutaneous QHS  . insulin aspart  8 Units Subcutaneous TID WC  . insulin detemir  10 Units Subcutaneous BID  . methylPREDNISolone (SOLU-MEDROL) injection  125 mg Intravenous Q12H  . mometasone-formoterol  2 puff Inhalation BID  . polyethylene glycol  17 g Oral Daily  . senna  1 tablet Oral QHS  . spironolactone  25 mg Oral Daily  . zinc sulfate  220 mg Oral Daily   Continuous Infusions: . sodium chloride 75 mL/hr at 07/29/20 0600  . cefTRIAXone (ROCEPHIN)  IV Stopped (07/31/20 0503)  . remdesivir 100 mg in NS 100 mL 100 mg (07/30/20 0858)     LOS: 4 days   Osvaldo Shipper  [facility] Office  [phone] Pager - Text Page per Loretha Stapler  If 7PM-7AM, please contact night-coverage per Amion 07/31/2020, 9:58 AM

## 2020-08-01 ENCOUNTER — Encounter (HOSPITAL_COMMUNITY): Payer: Self-pay | Admitting: Internal Medicine

## 2020-08-01 ENCOUNTER — Other Ambulatory Visit: Payer: Self-pay

## 2020-08-01 LAB — COMPREHENSIVE METABOLIC PANEL
ALT: 48 U/L — ABNORMAL HIGH (ref 0–44)
AST: 34 U/L (ref 15–41)
Albumin: 2.5 g/dL — ABNORMAL LOW (ref 3.5–5.0)
Alkaline Phosphatase: 103 U/L (ref 38–126)
Anion gap: 11 (ref 5–15)
BUN: 42 mg/dL — ABNORMAL HIGH (ref 6–20)
CO2: 31 mmol/L (ref 22–32)
Calcium: 9.2 mg/dL (ref 8.9–10.3)
Chloride: 95 mmol/L — ABNORMAL LOW (ref 98–111)
Creatinine, Ser: 1.48 mg/dL — ABNORMAL HIGH (ref 0.44–1.00)
GFR, Estimated: 42 mL/min — ABNORMAL LOW (ref >60)
Glucose, Bld: 221 mg/dL — ABNORMAL HIGH (ref 70–99)
Potassium: 5 mmol/L (ref 3.5–5.1)
Sodium: 137 mmol/L (ref 135–145)
Total Bilirubin: 0.7 mg/dL (ref 0.3–1.2)
Total Protein: 6.5 g/dL (ref 6.5–8.1)

## 2020-08-01 LAB — CBC WITH DIFFERENTIAL/PLATELET
Abs Immature Granulocytes: 3.1 10*3/uL — ABNORMAL HIGH (ref 0.00–0.07)
Basophils Absolute: 0.1 10*3/uL (ref 0.0–0.1)
Basophils Relative: 1 %
Eosinophils Absolute: 0 10*3/uL (ref 0.0–0.5)
Eosinophils Relative: 0 %
HCT: 40.6 % (ref 36.0–46.0)
Hemoglobin: 12.9 g/dL (ref 12.0–15.0)
Immature Granulocytes: 12 %
Lymphocytes Relative: 9 %
Lymphs Abs: 2.2 10*3/uL (ref 0.7–4.0)
MCH: 27.9 pg (ref 26.0–34.0)
MCHC: 31.8 g/dL (ref 30.0–36.0)
MCV: 87.7 fL (ref 80.0–100.0)
Monocytes Absolute: 0.8 10*3/uL (ref 0.1–1.0)
Monocytes Relative: 3 %
Neutro Abs: 19.1 10*3/uL — ABNORMAL HIGH (ref 1.7–7.7)
Neutrophils Relative %: 75 %
Platelets: 396 10*3/uL (ref 150–400)
RBC: 4.63 MIL/uL (ref 3.87–5.11)
RDW: 14.1 % (ref 11.5–15.5)
WBC: 25.3 10*3/uL — ABNORMAL HIGH (ref 4.0–10.5)
nRBC: 0.3 % — ABNORMAL HIGH (ref 0.0–0.2)

## 2020-08-01 LAB — GLUCOSE, CAPILLARY
Glucose-Capillary: 231 mg/dL — ABNORMAL HIGH (ref 70–99)
Glucose-Capillary: 286 mg/dL — ABNORMAL HIGH (ref 70–99)
Glucose-Capillary: 344 mg/dL — ABNORMAL HIGH (ref 70–99)
Glucose-Capillary: 308 mg/dL — ABNORMAL HIGH (ref 70–99)

## 2020-08-01 LAB — CULTURE, BLOOD (ROUTINE X 2): Culture: NO GROWTH

## 2020-08-01 LAB — D-DIMER, QUANTITATIVE: D-Dimer, Quant: 2.01 ug/mL-FEU — ABNORMAL HIGH (ref 0.00–0.50)

## 2020-08-01 LAB — C-REACTIVE PROTEIN: CRP: 3.5 mg/dL — ABNORMAL HIGH (ref <1.0)

## 2020-08-01 LAB — MAGNESIUM: Magnesium: 2.2 mg/dL (ref 1.7–2.4)

## 2020-08-01 MED ORDER — ORAL CARE MOUTH RINSE
15.0000 mL | Freq: Two times a day (BID) | OROMUCOSAL | Status: DC
  Administered 2020-08-02 – 2020-08-07 (×12): 15 mL via OROMUCOSAL

## 2020-08-01 MED ORDER — INSULIN DETEMIR 100 UNIT/ML ~~LOC~~ SOLN
20.0000 [IU] | Freq: Two times a day (BID) | SUBCUTANEOUS | Status: DC
  Administered 2020-08-01 – 2020-08-03 (×4): 20 [IU] via SUBCUTANEOUS
  Filled 2020-08-01 (×6): qty 0.2

## 2020-08-01 NOTE — Progress Notes (Signed)
Pt states family has brought her food to [facility], requesting it be picked up and brought to her. RN explained pts current diet of carb mod/heart healthy and risks associated w/ not following.  Pt states understanding, still requesting food.

## 2020-08-01 NOTE — Progress Notes (Signed)
Maria Duncan  VOJ:[id number] DOB: [birth year] DOA: 07/27/2020 PCP: Pcp, No    Brief Narrative:  55 year old with a history of HTN, obesity, pulmonary hypertension, OSA/OHS on CPAP, and combined systolic/diastolic CHF who presented to the ED with worsening shortness of breath.  She was diagnosed with Covid 12/28.  Since that time she has had progressively worsening shortness of breath, and some intermittent diarrhea.  In the ED she was found to be hypoxic and required 10 L oxygen support to keep saturations at 90% or better.  CXR noted extensive bilateral pulmonary infiltrates.  Significant Events:  12/28 diagnosed with Covid 1/3 admit via [place] 1/3 lower extremity venous duplex negative for DVT bilaterally  Date of Positive COVID Test:  12/28  Vaccination Status: Unvaccinated  COVID-19 specific Treatment: Remdesivir 1/3 > Solu-Medrol 1/3 > Actemra 1/3  Antimicrobials:  Rocephin Azithromycin  DVT prophylaxis: Lovenox  Subjective: Patient mentions that she is feeling better compared to yesterday.  Shortness of breath is improving.  Dry cough is improving.     Assessment & Plan:  Acute Hypoxic Resp. Failure/Pneumonia due to COVID-19   Recent Labs  Lab 07/27/20 0150 07/28/20 0357 07/29/20 0350 07/30/20 0225 07/31/20 0225 08/01/20 0103  DDIMER 3.89* 2.72* 2.05* 1.99* 1.98* 2.01*  FERRITIN 4,546* 4,308* 4,094* 2,737*  --   --   CRP 23.9* 21.5* 14.5* 10.6* 5.9* 3.5*  ALT 138* 102* 77* 61* 49* 48*  PROCALCITON 0.83  --  0.37 0.27  --   --     Objective findings: Oxygen requirements: On heated high flow.  Decreased to 20 L/min and 60% FiO2 this morning.  Saturations noted to be in the mid 90s.  Were on a nonrebreather.  COVID 19 Therapeutics: Antibacterials: Completed 5-day course of ceftriaxone and azithromycin.  Procalcitonin noted to be elevated at 0.83.  Improved to 0.27. Remdesivir: Completed 5-day course Steroids: Solu-Medrol Diuretics: Lasix changed to once  daily Inhaled Steroids: Dulera Actemra: Patient was given Actemra PUD Prophylaxis: famotidine DVT Prophylaxis:  Lovenox.  From a respiratory standpoint patient appears to be improving.  She remains tenuous however.  Oxygen requirements continued to go down.  Discussed with nursing staff over the try to wean her down further today.  Mobilize.  Patient has completed course of Remdesivir.  She was given Actemra.  She remains on steroids.  Inflammatory markers improving.  CRP down to 3.5.  Likely due to steroids.  D-dimer is stable.  Patient was noted to have elevated D-dimer.  She underwent Doppler studies which did not show any DVT.  Due to elevated creatinine she did not undergo CT scan.  Elevated D-dimer most likely due to COVID-19.  She was changed over to prophylactic doses of Lovenox.    The treatment plan and use of medications and known side effects were discussed with patient/family. Some of the medications used are based on case reports/anecdotal data.  All other medications being used in the management of COVID-19 based on limited study data.  Complete risks and long-term side effects are unknown, however in the best clinical judgment they seem to be of some benefit.  Patient/family wanted to proceed with treatment options provided.  Transaminitis Secondary to COVID-19.  Noted to be improving.    Acute renal failure  Baseline creatinine around 1.2.  Presented with a creatinine of 2.04.  Likely due to combination of acute infection and volume overload.  Renal function gradually improving.  Creatinine down to 1.48 today.  Continue to monitor urine output.  Remains  on daily furosemide.  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  Followed in the CHF clinic.  Holding [place] due to renal failure.  New furosemide once daily.  Diabetes mellitus  type 2, uncontrolled with hyperglycemia HbA1c 7.5.  Elevated CBGs due to steroids.  She was started on Levemir.  Levemir dose was adjusted yesterday.  Steroid dose was decreased yesterday.  May need further dose adjustment depending on CBGs.    Obesity Estimated body mass index is 39.77 kg/m as calculated from the following:   Height as of 07/23/20: 5\' 5"  (1.651 m).   Weight as of this encounter: 108.4 kg.    DVT prophylaxis: On Lovenox.   Code Status: FULL CODE Family Communication: Sister being updated every other day. Disposition: Hopefully return home when improved  Status is: Inpatient  Remains inpatient appropriate because:Inpatient level of care appropriate due to severity of illness   Dispo: The patient is from: Home              Anticipated d/c is to: Home              Anticipated d/c date is: > 3 days              Patient currently is not medically stable to d/c.   Consultants:  none  Objective: Blood pressure 139/89, pulse (!) 56, temperature 97.6 F (36.4 C), temperature source Oral, resp. rate 18, weight 108.4 kg, SpO2 95 %.  Intake/Output Summary (Last 24 hours) at 08/01/2020 1106 Last data filed at 08/01/2020 1041 Gross per 24 hour  Intake 990 ml  Output 2000 ml  Net -1010 ml   Filed Weights   07/31/20 0500 08/01/20 0500  Weight: 107 kg 108.4 kg     Examination:  General appearance: Awake alert.  In no distress Resp: Improved effort.  Still mildly tachypneic.  Crackles bilateral bases.  No wheezing or rhonchi. Cardio: S1-S2 is normal regular.  No S3-S4.  No rubs murmurs or bruit GI: Abdomen is soft.  Nontender nondistended.  Bowel sounds are present normal.  No masses organomegaly Extremities: No edema.  Full range of motion of lower extremities. Neurologic: Alert and oriented x3.  No focal neurological deficits.       CBC: Recent Labs  Lab 07/30/20 0225 07/31/20 0225 08/01/20 0103  WBC 20.8* 25.5* 25.3*  NEUTROABS 19.6* 19.9* 19.1*  HGB  12.2 12.8 12.9  HCT 37.5 39.9 40.6  MCV 86.2 87.5 87.7  PLT 396 365 396   Basic Metabolic Panel: Recent Labs  Lab 07/30/20 0225 07/31/20 0225 08/01/20 0103  NA 135 137 137  K 4.5 5.1 5.0  CL 97* 100 95*  CO2 24 26 31   GLUCOSE 367* 276* 221*  BUN 55* 48* 42*  CREATININE 1.80* 1.59* 1.48*  CALCIUM 9.2 9.0 9.2  MG 2.3 2.2 2.2   GFR: Estimated Creatinine Clearance: 53.2 mL/min (A) (by C-G formula based on SCr of 1.48 mg/dL (H)).  Liver Function Tests: Recent Labs  Lab 07/29/20 0350 07/30/20 0225 07/31/20 0225 08/01/20 0103  AST 43* 35 28 34  ALT 77* 61* 49* 48*  ALKPHOS 94 105 102 103  BILITOT 0.9 0.8 0.7 0.7  PROT 6.7 6.7 6.1* 6.5  ALBUMIN 2.3* 2.4* 2.3* 2.5*    HbA1C: Hgb A1c MFr Bld  Date/Time Value Ref Range Status  07/28/2020 03:57  AM 7.5 (H) 4.8 - 5.6 % Final    Comment:    (NOTE) Pre diabetes:          5.7%-6.4%  Diabetes:              >6.4%  Glycemic control for   <7.0% adults with diabetes   12/08/2019 04:35 AM 7.1 (H) 4.8 - 5.6 % Final    Comment:    (NOTE) Pre diabetes:          5.7%-6.4% Diabetes:              >6.4% Glycemic control for   <7.0% adults with diabetes     CBG: Recent Labs  Lab 07/31/20 0931 07/31/20 1152 07/31/20 1609 07/31/20 2034 08/01/20 0826  GLUCAP 280* 281* 351* 333* 231*    Recent Results (from the past 240 hour(s))  Blood Culture (routine x 2)     Status: None   Collection Time: 07/27/20  1:50 AM   Specimen: BLOOD LEFT ARM  Result Value Ref Range Status   Specimen Description BLOOD LEFT ARM  Final   Special Requests   Final    BOTTLES DRAWN AEROBIC AND ANAEROBIC Blood Culture results may not be optimal due to an inadequate volume of blood received in culture bottles   Culture   Final    NO GROWTH 5 DAYS Performed at [facility], [address] [place], [place] [address]    Report Status 08/01/2020 FINAL  Final  MRSA PCR Screening     Status: None   Collection Time: 07/28/20  4:47 PM    Specimen: Nasal Mucosa; Nasopharyngeal  Result Value Ref Range Status   MRSA by PCR NEGATIVE NEGATIVE Final    Comment:        The GeneXpert MRSA Assay (FDA approved for NASAL specimens only), is one component of a comprehensive MRSA colonization surveillance program. It is not intended to diagnose MRSA infection nor to guide or monitor treatment for MRSA infections. Performed at [facility], [address] [place], [place] [address]      Scheduled Meds: . vitamin C  500 mg Oral Daily  . aspirin EC  81 mg Oral Daily  . carvedilol  6.25 mg Oral BID WC  . Chlorhexidine Gluconate Cloth  6 each Topical Daily  . docusate sodium  100 mg Oral BID  . enoxaparin (LOVENOX) injection  0.5 mg/kg Subcutaneous Q24H  . famotidine  20 mg Oral Daily  . feeding supplement (GLUCERNA SHAKE)  237 mL Oral TID BM  . furosemide  60 mg Intravenous Daily  . insulin aspart  0-20 Units Subcutaneous TID WC  . insulin aspart  0-5 Units Subcutaneous QHS  . insulin aspart  8 Units Subcutaneous TID WC  . insulin detemir  15 Units Subcutaneous BID  . methylPREDNISolone (SOLU-MEDROL) injection  100 mg Intravenous Q12H  . mometasone-formoterol  2 puff Inhalation BID  . polyethylene glycol  17 g Oral Daily  . senna  1 tablet Oral QHS  . spironolactone  25 mg Oral Daily  . zinc sulfate  220 mg Oral Daily   Continuous Infusions: . sodium chloride 75 mL/hr at 07/29/20 0600     LOS: 5 days   Osvaldo Shipper  [facility] Office  [phone] Pager - Text Page per Loretha Stapler  If 7PM-7AM, please contact night-coverage per Amion 08/01/2020, 11:06 AM

## 2020-08-02 LAB — BASIC METABOLIC PANEL
Anion gap: 9 (ref 5–15)
BUN: 38 mg/dL — ABNORMAL HIGH (ref 6–20)
CO2: 29 mmol/L (ref 22–32)
Calcium: 9 mg/dL (ref 8.9–10.3)
Chloride: 98 mmol/L (ref 98–111)
Creatinine, Ser: 1.36 mg/dL — ABNORMAL HIGH (ref 0.44–1.00)
GFR, Estimated: 46 mL/min — ABNORMAL LOW (ref >60)
Glucose, Bld: 241 mg/dL — ABNORMAL HIGH (ref 70–99)
Potassium: 5.5 mmol/L — ABNORMAL HIGH (ref 3.5–5.1)
Sodium: 136 mmol/L (ref 135–145)

## 2020-08-02 LAB — GLUCOSE, CAPILLARY
Glucose-Capillary: 193 mg/dL — ABNORMAL HIGH (ref 70–99)
Glucose-Capillary: 275 mg/dL — ABNORMAL HIGH (ref 70–99)
Glucose-Capillary: 250 mg/dL — ABNORMAL HIGH (ref 70–99)
Glucose-Capillary: 266 mg/dL — ABNORMAL HIGH (ref 70–99)

## 2020-08-02 LAB — MAGNESIUM: Magnesium: 2.2 mg/dL (ref 1.7–2.4)

## 2020-08-02 MED ORDER — SODIUM ZIRCONIUM CYCLOSILICATE 10 G PO PACK
10.0000 g | PACK | Freq: Once | ORAL | Status: AC
  Administered 2020-08-02: 10 g via ORAL
  Filled 2020-08-02: qty 1

## 2020-08-02 MED ORDER — METHYLPREDNISOLONE SODIUM SUCC 125 MG IJ SOLR
80.0000 mg | Freq: Two times a day (BID) | INTRAMUSCULAR | Status: DC
  Administered 2020-08-02 – 2020-08-04 (×5): 80 mg via INTRAVENOUS
  Filled 2020-08-02 (×5): qty 2

## 2020-08-02 NOTE — Progress Notes (Signed)
Maria Duncan  KMQ:[id number] DOB: [birth year] DOA: 07/27/2020 PCP: Pcp, No    Brief Narrative:  55 year old with a history of HTN, obesity, pulmonary hypertension, OSA/OHS on CPAP, and combined systolic/diastolic CHF who presented to the ED with worsening shortness of breath.  She was diagnosed with Covid 12/28.  Since that time she has had progressively worsening shortness of breath, and some intermittent diarrhea.  In the ED she was found to be hypoxic and required 10 L oxygen support to keep saturations at 90% or better.  CXR noted extensive bilateral pulmonary infiltrates.  Significant Events:  12/28 diagnosed with Covid 1/3 admit via [place] 1/3 lower extremity venous duplex negative for DVT bilaterally  Date of Positive COVID Test:  12/28  Vaccination Status: Unvaccinated  COVID-19 specific Treatment: Remdesivir 1/3 > Solu-Medrol 1/3 > Actemra 1/3  Antimicrobials:  Rocephin Azithromycin  DVT prophylaxis: Lovenox  Subjective: Patient mentions that she is feeling better today compared to yesterday.  Shortness of breath is improving but she does get dyspneic when she moves around.  Cough is improving as well.  Denies any nausea vomiting.  No chest pain   Assessment & Plan:  Acute Hypoxic Resp. Failure/Pneumonia due to COVID-19   Recent Labs  Lab 07/27/20 0150 07/28/20 0357 07/29/20 0350 07/30/20 0225 07/31/20 0225 08/01/20 0103  DDIMER 3.89* 2.72* 2.05* 1.99* 1.98* 2.01*  FERRITIN 4,546* 4,308* 4,094* 2,737*  --   --   CRP 23.9* 21.5* 14.5* 10.6* 5.9* 3.5*  ALT 138* 102* 77* 61* 49* 48*  PROCALCITON 0.83  --  0.37 0.27  --   --     Objective findings: Oxygen requirements: Remains on heated high flow.  25 L/min, 50% FiO2, saturating in the mid to late 90s.  Hasn't required nonrebreather in the last 24 hours.  COVID 19 Therapeutics: Antibacterials: Completed 5-day course of ceftriaxone and azithromycin.  Procalcitonin noted to be elevated at 0.83.  Improved to  0.27. Remdesivir: Completed 5-day course Steroids: Solu-Medrol Diuretics: Lasix once daily Inhaled Steroids: Dulera Actemra: Patient was given Actemra PUD Prophylaxis: famotidine DVT Prophylaxis:  Lovenox.  From a respiratory standpoint patient appears to be improving.  Her O2 requirements are coming down.  Her saturations are improving.  Work of breathing is also better.  Patient has completed course of Remdesivir.  She was given Actemra x1.  She remains on steroids.  Inflammatory markers have been improving.  Leukocytosis is due to steroids.  D-dimer is stable.  Patient was noted to have elevated D-dimer.  She underwent Doppler studies which did not show any DVT.  Due to elevated creatinine she did not undergo CT scan.  Elevated D-dimer most likely due to COVID-19.  She was changed over to prophylactic doses of Lovenox.    The treatment plan and use of medications and known side effects were discussed with patient/family. Some of the medications used are based on case reports/anecdotal data.  All other medications being used in the management of COVID-19 based on limited study data.  Complete risks and long-term side effects are unknown, however in the best clinical judgment they seem to be of some benefit.  Patient/family wanted to proceed with treatment options provided.  Transaminitis Secondary to COVID-19.  Noted to be improving.    Acute renal failure/hyperkalemia Baseline creatinine around 1.2.  Presented with a creatinine of 2.04.  Likely due to combination of acute infection and volume overload.  Renal function gradually improving.  Creatinine down to 1.36.  Remains on daily furosemide.  Potassium noted to be 5.5 today.  One dose of Lokelma will be ordered.  Recheck tomorrow.    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  Followed in the CHF clinic.   Holding [place] and [place] due to renal failure.  Continue furosemide once daily for now.  Strict ins and outs and daily weights.  Diabetes mellitus type 2, uncontrolled with hyperglycemia HbA1c 7.5.  Elevated CBGs due to steroids.  Patient started on Levemir.  Dose was adjusted yesterday.  CBGs noted to be stable.  May need further dose adjustment depending on glucose levels.     Obesity Estimated body mass index is 38.81 kg/m as calculated from the following:   Height as of this encounter: 5\' 5"  (1.651 m).   Weight as of this encounter: 105.8 kg.    DVT prophylaxis: On Lovenox.   Code Status: FULL CODE Family Communication: Sister being updated every other day. Disposition: Hopefully return home when improved.  Should be able to downgrade her from ICU to progressive.  Status is: Inpatient  Remains inpatient appropriate because:Inpatient level of care appropriate due to severity of illness   Dispo: The patient is from: Home              Anticipated d/c is to: Home              Anticipated d/c date is: > 3 days              Patient currently is not medically stable to d/c.   Consultants:  none  Objective: Blood pressure 116/72, pulse (!) 59, temperature 97.9 F (36.6 C), temperature source Oral, resp. rate (!) 23, height 5\' 5"  (1.651 m), weight 105.8 kg, SpO2 97 %.  Intake/Output Summary (Last 24 hours) at 08/02/2020 1058 Last data filed at 08/02/2020 0200 Gross per 24 hour  Intake 720 ml  Output 650 ml  Net 70 ml   Filed Weights   07/31/20 0500 08/01/20 0500 08/02/20 0500  Weight: 107 kg 108.4 kg 105.8 kg     Examination:  General appearance: Awake alert.  In no distress Resp: Tachypneic at rest but improved from before.  Crackles bilateral bases.  No wheezing or rhonchi. Cardio: S1-S2 is normal regular.  No S3-S4.  No rubs murmurs or bruit GI: Abdomen is soft.  Nontender nondistended.  Bowel sounds are present normal.  No masses organomegaly Extremities: No  edema.  Full range of motion of lower extremities. Neurologic: Alert and oriented x3.  No focal neurological deficits.       CBC: Recent Labs  Lab 07/30/20 0225 07/31/20 0225 08/01/20 0103  WBC 20.8* 25.5* 25.3*  NEUTROABS 19.6* 19.9* 19.1*  HGB 12.2 12.8 12.9  HCT 37.5 39.9 40.6  MCV 86.2 87.5 87.7  PLT 396 365 396   Basic Metabolic Panel: Recent Labs  Lab 07/31/20 0225 08/01/20 0103 08/02/20 0304  NA 137 137 136  K 5.1 5.0 5.5*  CL 100 95* 98  CO2 26 31 29   GLUCOSE 276* 221* 241*  BUN 48* 42* 38*  CREATININE 1.59* 1.48* 1.36*  CALCIUM 9.0 9.2 9.0  MG 2.2 2.2 2.2   GFR: Estimated Creatinine Clearance: 57.1 mL/min (A) (by C-G formula based on SCr of 1.36 mg/dL (H)).  Liver Function Tests: Recent Labs  Lab 07/29/20 0350 07/30/20 0225 07/31/20 0225 08/01/20 0103  AST 43* 35  28 34  ALT 77* 61* 49* 48*  ALKPHOS 94 105 102 103  BILITOT 0.9 0.8 0.7 0.7  PROT 6.7 6.7 6.1* 6.5  ALBUMIN 2.3* 2.4* 2.3* 2.5*    HbA1C: Hgb A1c MFr Bld  Date/Time Value Ref Range Status  07/28/2020 03:57 AM 7.5 (H) 4.8 - 5.6 % Final    Comment:    (NOTE) Pre diabetes:          5.7%-6.4%  Diabetes:              >6.4%  Glycemic control for   <7.0% adults with diabetes   12/08/2019 04:35 AM 7.1 (H) 4.8 - 5.6 % Final    Comment:    (NOTE) Pre diabetes:          5.7%-6.4% Diabetes:              >6.4% Glycemic control for   <7.0% adults with diabetes     CBG: Recent Labs  Lab 08/01/20 0826 08/01/20 1137 08/01/20 1613 08/01/20 2051 08/02/20 0926  GLUCAP 231* 286* 344* 308* 193*    Recent Results (from the past 240 hour(s))  Blood Culture (routine x 2)     Status: None   Collection Time: 07/27/20  1:50 AM   Specimen: BLOOD LEFT ARM  Result Value Ref Range Status   Specimen Description BLOOD LEFT ARM  Final   Special Requests   Final    BOTTLES DRAWN AEROBIC AND ANAEROBIC Blood Culture results may not be optimal due to an inadequate volume of blood received in  culture bottles   Culture   Final    NO GROWTH 5 DAYS Performed at [facility], [address], [place], [place] [address]    Report Status 08/01/2020 FINAL  Final  MRSA PCR Screening     Status: None   Collection Time: 07/28/20  4:47 PM   Specimen: Nasal Mucosa; Nasopharyngeal  Result Value Ref Range Status   MRSA by PCR NEGATIVE NEGATIVE Final    Comment:        The GeneXpert MRSA Assay (FDA approved for NASAL specimens only), is one component of a comprehensive MRSA colonization surveillance program. It is not intended to diagnose MRSA infection nor to guide or monitor treatment for MRSA infections. Performed at [facility], [address], [place], [place] [address]      Scheduled Meds: . aspirin EC  81 mg Oral Daily  . carvedilol  6.25 mg Oral BID WC  . Chlorhexidine Gluconate Cloth  6 each Topical Daily  . docusate sodium  100 mg Oral BID  . enoxaparin (LOVENOX) injection  0.5 mg/kg Subcutaneous Q24H  . famotidine  20 mg Oral Daily  . feeding supplement (GLUCERNA SHAKE)  237 mL Oral TID BM  . furosemide  60 mg Intravenous Daily  . insulin aspart  0-20 Units Subcutaneous TID WC  . insulin aspart  0-5 Units Subcutaneous QHS  . insulin aspart  8 Units Subcutaneous TID WC  . insulin detemir  20 Units Subcutaneous BID  . mouth rinse  15 mL Mouth Rinse BID  . methylPREDNISolone (SOLU-MEDROL) injection  100 mg Intravenous Q12H  . mometasone-formoterol  2 puff Inhalation BID  . polyethylene glycol  17 g Oral Daily  . senna  1 tablet Oral QHS   Continuous Infusions: . sodium chloride 75 mL/hr at 07/29/20 0600     LOS: 6 days   [employer]  [facility] Office  [phone] Pager - Text  Page per Loretha Stapler  If 7PM-7AM, please contact night-coverage per Amion 08/02/2020, 10:58 AM

## 2020-08-02 NOTE — Evaluation (Signed)
Physical Therapy Evaluation Patient Details Name: Maria Duncan MRN: [id number] DOB: 03/28/1966 Today's Date: 08/02/2020   History of Present Illness  This 55 y.o. female admitted with worsening SOB.  Dx: COVID -19.  She developed ARF, Transaminitis.  She has required heated HFNC 25L 60% Fi02.   PMH includes: pulmonary HTN, OSA (CPAP), nonischemic cardiomyopathy, morbid obesity, HTN, DM, CHF    Clinical Impression  Pt admitted with above diagnosis. PTA pt lived at home with her husband, independent and active. On eval, she required min guard assist transfers. She presents with deficits in strength and activity tolerance. Pt on 25L HHFNC 50% FiO2. SpO2 95% at rest. Desat to 82-90% during activity. Pt currently with functional limitations due to the deficits listed below (see PT Problem List). Pt will benefit from skilled PT to increase their independence and safety with mobility to allow discharge to the venue listed below.  Pt's husband is also currently hospitalized with covid. Pt's daughters plan to stay with pt/husband upon d/c to assist with care.      Follow Up Recommendations Home health PT    Equipment Recommendations  Other (comment) (TBD)    Recommendations for Other Services       Precautions / Restrictions Precautions Precautions: Other (comment) Precaution Comments: monitor sats      Mobility  Bed Mobility               General bed mobility comments: up in recliner    Transfers Overall transfer level: Needs assistance Equipment used: None Transfers: Sit to/from Stand;Stand Pivot Transfers Sit to Stand: Min guard Stand pivot transfers: Min guard       General transfer comment: min guard for safety  Ambulation/Gait             General Gait Details: unable to progess due to O2 needs/HHFNC  Stairs            Wheelchair Mobility    Modified Rankin (Stroke Patients Only)       Balance Overall balance assessment: Mild deficits observed, not  formally tested                                           Pertinent Vitals/Pain Pain Assessment: No/denies pain    Home Living Family/patient expects to be discharged to:: Private residence Living Arrangements: Spouse/significant other Available Help at Discharge: Family;Available 24 hours/day Type of Home: House Home Access: Level entry     Home Layout: Two level;1/2 bath on main level;Bed/bath upstairs;Able to live on main level with bedroom/bathroom Home Equipment: Other (comment) (CPAP/nocturnal O2) Additional Comments: pt's spouse also hospitalized with COVID - 19.  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     Hand Dominance   Dominant Hand: Right    Extremity/Trunk Assessment   Upper Extremity Assessment Upper Extremity Assessment: Overall WFL for tasks assessed    Lower Extremity Assessment Lower Extremity Assessment: Generalized weakness    Cervical / Trunk Assessment Cervical / Trunk Assessment: Normal  Communication   Communication: No difficulties  Cognition Arousal/Alertness: Awake/alert Behavior During Therapy: WFL for tasks  assessed/performed Overall Cognitive Status: Within Functional Limits for tasks assessed                                        General Comments General comments (skin integrity, edema, etc.): Pt on 25L HHFNC 50% FiO2. SpO2 95% at rest. Desat 82-90% during activity and conversation.    Exercises Other Exercises Other Exercises: Pt instructed in controlled breathing techniques   Assessment/Plan    PT Assessment Patient needs continued PT services  PT Problem List Decreased strength;Decreased mobility;Decreased activity  tolerance;Cardiopulmonary status limiting activity       PT Treatment Interventions      PT Goals (Current goals can be found in the Care Plan section)  Acute Rehab PT Goals Patient Stated Goal: to go back to work PT Goal Formulation: With patient Time For Goal Achievement: 08/16/20 Potential to Achieve Goals: Good    Frequency Min 3X/week   Barriers to discharge        Co-evaluation PT/OT/SLP Co-Evaluation/Treatment: Yes Reason for Co-Treatment: Complexity of the patient's impairments (multi-system involvement);For patient/therapist safety PT goals addressed during session: Mobility/safety with mobility;Balance OT goals addressed during session: ADL's and self-care       AM-PAC PT "6 Clicks" Mobility  Outcome Measure Help needed turning from your back to your side while in a flat bed without using bedrails?: None Help needed moving from lying on your back to sitting on the side of a flat bed without using bedrails?: A Little Help needed moving to and from a bed to a chair (including a wheelchair)?: A Little Help needed standing up from a chair using your arms (e.g., wheelchair or bedside chair)?: A Little Help needed to walk in hospital room?: A Little Help needed climbing 3-5 steps with a railing? : A Lot 6 Click Score: 18    End of Session Equipment Utilized During Treatment: Oxygen Activity Tolerance: Patient tolerated treatment well Patient left: in chair;with call bell/phone within reach Nurse Communication: Mobility status PT Visit Diagnosis: Difficulty in walking, not elsewhere classified (R26.2);Muscle weakness (generalized) (M62.81)    Time: 8144-8185 PT Time Calculation (min) (ACUTE ONLY): 25 min   Charges:   PT Evaluation $PT Eval Moderate Complexity: 1 Mod          Aida Raider, PT  Office # [phone] Pager [phone]   Ilda Foil 08/02/2020, 3:47 PM

## 2020-08-02 NOTE — Evaluation (Signed)
Occupational Therapy Evaluation Patient Details Name: Maria Duncan MRN: [id number] DOB: 1966/03/08 Today's Date: 08/02/2020    History of Present Illness This 55 y.o. female admitted with worsening SOB.  Dx: COVID -19.  She developed ARF, Transaminitis.  She has required heated HFNC 25L 60% Fi02.   PMH includes: pulmonary HTN, OSA (CPAP), nonischemic cardiomyopathy, morbid obesity, HTN, DM, CHF   Clinical Impression   Pt admitted with above. She demonstrates the below listed deficits and will benefit from continued OT to maximize safety and independence with BADLs.  Pt presents to OT with generalized weakness, and decreased activity tolerance.  She requires set up - min A for ADLs and min guard assist for functional transfers.  She currently is on 25L 02 via heated HFNC 50% Fi02 with sats 83-90% with activity.  She reports she lives with her spouse (who is also hospitalized with COVID -19), and was fully independent PTA.  Pt's daughters will be able to provide assist as discharge, per pt.       Follow Up Recommendations  No OT follow up;Supervision/Assistance - 24 hour (initially)    Equipment Recommendations  Tub/shower bench    Recommendations for Other Services       Precautions / Restrictions Precautions Precautions: Other (comment) Precaution Comments: monitor sats      Mobility Bed Mobility               General bed mobility comments: up in recliner    Transfers Overall transfer level: Needs assistance Equipment used: None Transfers: Sit to/from Stand;Stand Pivot Transfers Sit to Stand: Min guard Stand pivot transfers: Min guard       General transfer comment: min guard for safety    Balance Overall balance assessment: Mild deficits observed, not formally tested                                         ADL either performed or assessed with clinical judgement   ADL Overall ADL's : Needs assistance/impaired Eating/Feeding: Independent    Grooming: Wash/dry hands;Wash/dry face;Oral care;Brushing hair;Sitting;Set up   Upper Body Bathing: Set up;Sitting   Lower Body Bathing: Minimal assistance;Sit to/from stand   Upper Body Dressing : Set up;Sitting   Lower Body Dressing: Minimal assistance;Sit to/from stand   Toilet Transfer: Min guard;Stand-pivot;BSC   Toileting- Architect and Hygiene: Min guard;Sit to/from stand       Functional mobility during ADLs: Min guard       Vision Patient Visual Report: No change from baseline       Perception     Praxis      Pertinent Vitals/Pain Pain Assessment: No/denies pain     Hand Dominance Right   Extremity/Trunk Assessment Upper Extremity Assessment Upper Extremity Assessment: Overall WFL for tasks assessed   Lower Extremity Assessment Lower Extremity Assessment: Defer to PT evaluation   Cervical / Trunk Assessment Cervical / Trunk Assessment: Normal   Communication Communication Communication: No difficulties   Cognition Arousal/Alertness: Awake/alert Behavior During Therapy: WFL for tasks assessed/performed Overall Cognitive Status: Within Functional Limits for tasks assessed                                     General Comments  Pt on 25L heated HFNC 50% Fi02 with sp02 83-90% with activity.    Exercises Exercises:  Other exercises Other Exercises Other Exercises: Pt instructed in controlled breathing techniques   Shoulder Instructions      Home Living Family/patient expects to be discharged to:: Private residence Living Arrangements: Spouse/significant other Available Help at Discharge: Family;Available 24 hours/day Type of Home: House Home Access: Level entry     Home Layout: Two level;1/2 bath on main level;Bed/bath upstairs;Able to live on main level with bedroom/bathroom Alternate Level Stairs-Number of Steps: full flight   Bathroom Shower/Tub: Chief Strategy Officer: Standard     Home  Equipment: Other (comment) (02 for CPAP which she uses at night)   Additional Comments: pt's spouse also hospitalized with COVID - 19.  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        OT Problem List: Decreased strength;Decreased activity tolerance;Impaired balance (sitting and/or standing);Decreased knowledge of use of DME or AE;Cardiopulmonary status limiting activity      OT Treatment/Interventions: Self-care/ADL training;Therapeutic exercise;Energy conservation;DME and/or AE instruction;Therapeutic activities;Patient/family education;Balance training    OT Goals(Current goals can be found in the care plan section) Acute Rehab OT Goals Patient Stated Goal: to go back to work OT Goal Formulation: With patient Time For Goal Achievement: 08/16/20 Potential to Achieve Goals: Good ADL Goals Pt Will Perform Grooming: with modified independence;standing Pt Will Perform Upper Body Bathing: with modified independence;sitting Pt Will Perform Lower Body Bathing: with modified independence;sit to/from stand Pt Will Perform Upper Body Dressing: with modified independence;sitting Pt Will Perform Lower Body Dressing: with modified independence;sit to/from stand Pt Will Transfer to Toilet: with modified independence;ambulating;regular height toilet;grab bars Pt Will Perform Toileting - Clothing Manipulation and hygiene: with modified independence;sit to/from stand Pt Will Perform Tub/Shower Transfer: Tub transfer;with min guard assist;ambulating;tub bench Pt/caregiver will Perform Home Exercise Program: Increased strength;Right Upper extremity;Left upper extremity;With theraband;With written HEP provided;Independently Additional ADL  Goal #1: Pt will perform bathing and dressing with no more than 3 rest breaks and sp02 no < 90% Additional ADL Goal #2: Pt will independently incorporate energy conservation strategies during ADLs  OT Frequency: Min 2X/week   Barriers to D/C:            Co-evaluation PT/OT/SLP Co-Evaluation/Treatment: Yes Reason for Co-Treatment: For patient/therapist safety;To address functional/ADL transfers   OT goals addressed during session: ADL's and self-care      AM-PAC OT "6 Clicks" Daily Activity     Outcome Measure Help from another person eating meals?: Total Help from another person taking care of personal grooming?: A Little Help from another person toileting, which includes using toliet, bedpan, or urinal?: A Little Help from another person bathing (including washing, rinsing, drying)?: A Little Help from another person to put on and taking off regular upper body clothing?: A Little Help from another person to put on and taking off regular lower body clothing?: A Little 6 Click Score: 16   End of Session Equipment Utilized During Treatment: Oxygen Nurse Communication: Mobility status  Activity Tolerance: Patient tolerated treatment well Patient left: in chair;with call bell/phone within reach  OT Visit Diagnosis: Unsteadiness on feet (R26.81)                Time: 1610-9604 OT Time Calculation (min): 20  min Charges:  OT General Charges $OT Visit: 1 Visit OT Evaluation $OT Eval Moderate Complexity: 1 Mod  Eber Jones., OTR/L Acute Rehabilitation Services Pager [phone] Office [phone]   Jeani Hawking M 08/02/2020, 3:33 PM

## 2020-08-03 ENCOUNTER — Inpatient Hospital Stay (HOSPITAL_COMMUNITY): Payer: HRSA Program

## 2020-08-03 LAB — CBC
HCT: 39.4 % (ref 36.0–46.0)
Hemoglobin: 12.5 g/dL (ref 12.0–15.0)
MCH: 28 pg (ref 26.0–34.0)
MCHC: 31.7 g/dL (ref 30.0–36.0)
MCV: 88.3 fL (ref 80.0–100.0)
Platelets: 367 10*3/uL (ref 150–400)
RBC: 4.46 MIL/uL (ref 3.87–5.11)
RDW: 14.2 % (ref 11.5–15.5)
WBC: 21.5 10*3/uL — ABNORMAL HIGH (ref 4.0–10.5)
nRBC: 0 % (ref 0.0–0.2)

## 2020-08-03 LAB — BASIC METABOLIC PANEL
Anion gap: 9 (ref 5–15)
BUN: 39 mg/dL — ABNORMAL HIGH (ref 6–20)
CO2: 27 mmol/L (ref 22–32)
Calcium: 8.9 mg/dL (ref 8.9–10.3)
Chloride: 97 mmol/L — ABNORMAL LOW (ref 98–111)
Creatinine, Ser: 1.22 mg/dL — ABNORMAL HIGH (ref 0.44–1.00)
GFR, Estimated: 53 mL/min — ABNORMAL LOW (ref >60)
Glucose, Bld: 257 mg/dL — ABNORMAL HIGH (ref 70–99)
Potassium: 5.2 mmol/L — ABNORMAL HIGH (ref 3.5–5.1)
Sodium: 133 mmol/L — ABNORMAL LOW (ref 135–145)

## 2020-08-03 LAB — GLUCOSE, CAPILLARY
Glucose-Capillary: 270 mg/dL — ABNORMAL HIGH (ref 70–99)
Glucose-Capillary: 185 mg/dL — ABNORMAL HIGH (ref 70–99)
Glucose-Capillary: 247 mg/dL — ABNORMAL HIGH (ref 70–99)
Glucose-Capillary: 314 mg/dL — ABNORMAL HIGH (ref 70–99)

## 2020-08-03 MED ORDER — INSULIN DETEMIR 100 UNIT/ML ~~LOC~~ SOLN
4.0000 [IU] | Freq: Once | SUBCUTANEOUS | Status: AC
  Administered 2020-08-03: 4 [IU] via SUBCUTANEOUS
  Filled 2020-08-03 (×2): qty 0.04

## 2020-08-03 MED ORDER — INSULIN DETEMIR 100 UNIT/ML ~~LOC~~ SOLN
24.0000 [IU] | Freq: Two times a day (BID) | SUBCUTANEOUS | Status: DC
  Filled 2020-08-03 (×2): qty 0.24

## 2020-08-03 MED ORDER — SODIUM ZIRCONIUM CYCLOSILICATE 10 G PO PACK
10.0000 g | PACK | Freq: Once | ORAL | Status: AC
  Administered 2020-08-03: 10 g via ORAL
  Filled 2020-08-03: qty 1

## 2020-08-03 MED ORDER — INSULIN DETEMIR 100 UNIT/ML ~~LOC~~ SOLN
24.0000 [IU] | Freq: Two times a day (BID) | SUBCUTANEOUS | Status: DC
  Administered 2020-08-03 – 2020-08-07 (×8): 24 [IU] via SUBCUTANEOUS
  Filled 2020-08-03 (×12): qty 0.24

## 2020-08-03 NOTE — Plan of Care (Signed)
  Problem: Education: Goal: Knowledge of risk factors and measures for prevention of condition will improve Outcome: Progressing   Problem: Coping: Goal: Psychosocial and spiritual needs will be supported Outcome: Progressing   Problem: Respiratory: Goal: Will maintain a patent airway Outcome: Progressing Goal: Complications related to the disease process, condition or treatment will be avoided or minimized Outcome: Progressing   

## 2020-08-03 NOTE — Progress Notes (Signed)
Physical Therapy Treatment Patient Details Name: Maria Duncan MRN: [id number] DOB: June 28, 1966 Today's Date: 08/03/2020    History of Present Illness 55 y.o. female admitted 1/3 with worsening SOB due to COVID -19 PNA. Diagnosed 12/28.  She developed ARF, Transaminitis and has required HHFNC. PMH includes: pulmonary HTN, OSA (CPAP), nonischemic cardiomyopathy, morbid obesity, HTN, DM, CHF    PT Comments    Pt pleasant and eager to get up and move. 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 Pt's spouse is also admitted and pt concerned over him. Pt with improved standing and mobility this session with desaturation to 86% at the end of each gait trial with seated rest and cues throughout for breathing technique. Pt encouraged to be OOB for meals, BSC and continued progression with energy conservation.  20L, 50% FiO2 HHFNC with SpO2 85-99% with activity, HR 82   Follow Up Recommendations  Home health PT     Equipment Recommendations  None recommended by PT    Recommendations for Other Services       Precautions / Restrictions Precautions Precautions: Other (comment) Precaution Comments: HHFNC, monitor sats    Mobility  Bed Mobility Overal bed mobility: Needs Assistance Bed Mobility: Supine to Sit     Supine to sit: Supervision     General bed mobility comments: supervision for lines with HOB 25 degrees  Transfers Overall transfer level: Needs assistance   Transfers: Sit to/from Stand;Stand Pivot Transfers Sit to Stand: Min guard Stand pivot transfers: Min guard       General transfer comment: guarding for safety with HHA to pivot from bed to chair. Pt stood from chair x 2  Ambulation/Gait Ambulation/Gait assistance: Min guard Gait Distance (Feet): 20 Feet Assistive device: 1 person hand held assist Gait Pattern/deviations: Step-through pattern;Decreased stride length   Gait velocity interpretation: 1.31 - 2.62 ft/sec, indicative of  limited community ambulator General Gait Details: pt walked 2' forward and back x 3 trials initial gait, 2nd trial performed 5 trials   Stairs             Wheelchair Mobility    Modified Rankin (Stroke Patients Only)       Balance Overall balance assessment: Mild deficits observed, not formally tested                                          Cognition Arousal/Alertness: Awake/alert Behavior During Therapy: WFL for tasks assessed/performed Overall Cognitive Status: Within Functional Limits for tasks assessed                                        Exercises General Exercises - Upper Extremity Shoulder Flexion: AROM;Both;Seated;10 reps General Exercises - Lower Extremity Ankle Circles/Pumps: AROM;Both;Seated;10 reps Long Arc Quad: AROM;Both;Seated;15 reps Hip Flexion/Marching: AROM;Both;Seated;Standing;10 reps;15 reps (10 reps in standing, 15 reps seated)    General Comments        Pertinent Vitals/Pain Pain Assessment: No/denies pain    Home Living                      Prior Function            PT Goals (current goals can now be found in the care plan section) Progress towards PT goals: Progressing  toward goals    Frequency    Min 3X/week      PT Plan Current plan remains appropriate    Co-evaluation              AM-PAC PT "6 Clicks" Mobility   Outcome Measure  Help needed turning from your back to your side while in a flat bed without using bedrails?: None Help needed moving from lying on your back to sitting on the side of a flat bed without using bedrails?: A Little Help needed moving to and from a bed to a chair (including a wheelchair)?: A Little Help needed standing up from a chair using your arms (e.g., wheelchair or bedside chair)?: A Little Help needed to walk in hospital room?: A Little Help needed climbing 3-5 steps with a railing? : A Lot 6 Click Score: 18    End of Session  Equipment Utilized During Treatment: Oxygen Activity Tolerance: Patient tolerated treatment well Patient left: in chair;with call bell/phone within reach Nurse Communication: Mobility status PT Visit Diagnosis: Difficulty in walking, not elsewhere classified (R26.2);Muscle weakness (generalized) (M62.81)     Time: 1209-1228 PT Time Calculation (min) (ACUTE ONLY): 19 min  Charges:  $Gait Training: 8-22 mins                     [name], PT Acute Rehabilitation Services Pager: [phone] Office: [phone]    [name] [name] 08/03/2020, 12:51 PM

## 2020-08-03 NOTE — Progress Notes (Signed)
Maria Duncan  PPJ:[id number] DOB: October 10, 1965 DOA: 07/27/2020 PCP: Pcp, No    Brief Narrative:  55 year old with a history of HTN, obesity, pulmonary hypertension, OSA/OHS on CPAP, and combined systolic/diastolic CHF who presented to the ED with worsening shortness of breath.  She was diagnosed with Covid 12/28.  Since that time she has had progressively worsening shortness of breath, and some intermittent diarrhea.  In the ED she was found to be hypoxic and required 10 L oxygen support to keep saturations at 90% or better.  CXR noted extensive bilateral pulmonary infiltrates.  Significant Events:  12/28 diagnosed with Covid 1/3 admit via [place] 1/3 lower extremity venous duplex negative for DVT bilaterally  Date of Positive COVID Test:  12/28  Vaccination Status: Unvaccinated  COVID-19 specific Treatment: Remdesivir 1/3 > Solu-Medrol 1/3 > Actemra 1/3  Antimicrobials:  Rocephin Azithromycin  DVT prophylaxis: Lovenox  Subjective: Patient mentions that she continues to feel better.  Still gets short of breath with minimal exertion but overall she has noted improvement.  Cough is better as well.  Complains of constipation.  Discussed with nursing staff who will give her laxatives today.  Denies any chest pain.  Urinating well   Assessment & Plan:  Acute Hypoxic Resp. Failure/Pneumonia due to COVID-19   Recent Labs  Lab 07/28/20 0357 07/29/20 0350 07/30/20 0225 07/31/20 0225 08/01/20 0103  DDIMER 2.72* 2.05* 1.99* 1.98* 2.01*  FERRITIN 4,308* 4,094* 2,737*  --   --   CRP 21.5* 14.5* 10.6* 5.9* 3.5*  ALT 102* 77* 61* 49* 48*  PROCALCITON  --  0.37 0.27  --   --     Objective findings: Oxygen requirements: Remains on heated high flow.  Down to 20 L/min, 50% FiO2.  Saturations in the early 90s.    COVID 19 Therapeutics: Antibacterials: Completed 5-day course of ceftriaxone and azithromycin.  Procalcitonin noted to be elevated at 0.83.  Improved to 0.27. Remdesivir:  Completed 5-day course Steroids: Solu-Medrol Diuretics: Lasix once daily Inhaled Steroids: Dulera Actemra: Patient was given Actemra PUD Prophylaxis: famotidine DVT Prophylaxis:  Lovenox.  From a respiratory standpoint patient appears to be improving.  Her O2 requirements are slowly coming down.  Work of breathing is also improving.  Patient has completed course of Remdesivir.  She was given Actemra x1.  Remains on steroids.  Inflammatory markers have been improving.  Leukocytosis is due to steroids.  Chest x-ray done this morning shows slight improvement.  D-dimer is stable.  Patient was noted to have elevated D-dimer.  She underwent Doppler studies which did not show any DVT.  Due to elevated creatinine she did not undergo CT scan.  Elevated D-dimer most likely due to COVID-19.  She was changed over to prophylactic doses of Lovenox.    The treatment plan and use of medications and known side effects were discussed with patient/family. Some of the medications used are based on case reports/anecdotal data.  All other medications being used in the management of COVID-19 based on limited study data.  Complete risks and long-term side effects are unknown, however in the best clinical judgment they seem to be of some benefit.  Patient/family wanted to proceed with treatment options provided.  Transaminitis Secondary to COVID-19.  Noted to be improving.    Acute renal failure/hyperkalemia/hyponatremia Baseline creatinine around 1.2.  Presented with a creatinine of 2.04.  Likely due to combination of acute infection and cardiorenal.  Renal function slowly improving.  Monitor urine output.  Creatinine is down to 1.2  today.  Serum level better but still elevated.  Another dose of Lokelma.  Spironolactone was discontinued.  Recheck sodium level tomorrow.  May need to cut back on her furosemide dosage.  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  Followed in the CHF clinic.  Holding [place] and [place] due to renal failure.  Remains on furosemide once daily.  Spironolactone was discontinued due to hyperkalemia.  Strict ins and outs and daily weights.  Diabetes mellitus type 2, uncontrolled with hyperglycemia HbA1c 7.5.  Elevated CBGs due to steroids.  Patient started on Levemir.  CBGs still elevated.  Dose of steroid was decreased yesterday.  We will go up slightly on her dose of Levemir.  Obesity Estimated body mass index is 38.81 kg/m as calculated from the following:   Height as of this encounter: 5\' 5"  (1.651 m).   Weight as of this encounter: 105.8 kg.    DVT prophylaxis: On Lovenox.   Code Status: FULL CODE Family Communication: Sister being updated every other day. Disposition: Hopefully return home when improved.    Status is: Inpatient  Remains inpatient appropriate because:Inpatient level of care appropriate due to severity of illness   Dispo: The patient is from: Home              Anticipated d/c is to: Home              Anticipated d/c date is: > 3 days              Patient currently is not medically stable to d/c.   Consultants:  none  Objective: Blood pressure (!) 109/92, pulse 80, temperature 97.7 F (36.5 C), temperature source Axillary, resp. rate (!) 26, height 5\' 5"  (1.651 m), weight 105.8 kg, SpO2 92 %.  Intake/Output Summary (Last 24 hours) at 08/03/2020 1053 Last data filed at 08/03/2020 1029 Gross per 24 hour  Intake 840 ml  Output 1025 ml  Net -185 ml   Filed Weights   07/31/20 0500 08/01/20 0500 08/02/20 0500  Weight: 107 kg 108.4 kg 105.8 kg     Examination:  General appearance: Awake alert.  In no distress Resp: Improving effort.  Less tachypneic today.  No use of accessory muscles.  Crackles bilateral bases.  No wheezing or rhonchi. Cardio: S1-S2 is normal regular.  No S3-S4.  No rubs murmurs or  bruit GI: Abdomen is soft.  Nontender nondistended.  Bowel sounds are present normal.  No masses organomegaly Extremities: No edema.  Full range of motion of lower extremities. Neurologic: Alert and oriented x3.  No focal neurological deficits.       CBC: Recent Labs  Lab 07/30/20 0225 07/31/20 0225 08/01/20 0103 08/03/20 0217  WBC 20.8* 25.5* 25.3* 21.5*  NEUTROABS 19.6* 19.9* 19.1*  --   HGB 12.2 12.8 12.9 12.5  HCT 37.5 39.9 40.6 39.4  MCV 86.2 87.5 87.7 88.3  PLT 396 365 396 367   Basic Metabolic Panel: Recent Labs  Lab 07/31/20 0225 08/01/20 0103 08/02/20 0304 08/03/20 0217  NA 137 137 136 133*  K 5.1 5.0 5.5* 5.2*  CL 100 95* 98 97*  CO2 26 31 29 27   GLUCOSE 276* 221* 241* 257*  BUN 48* 42* 38* 39*  CREATININE 1.59* 1.48* 1.36* 1.22*  CALCIUM 9.0 9.2 9.0 8.9  MG 2.2 2.2 2.2  --    GFR:  Estimated Creatinine Clearance: 63.7 mL/min (A) (by C-G formula based on SCr of 1.22 mg/dL (H)).  Liver Function Tests: Recent Labs  Lab 07/29/20 0350 07/30/20 0225 07/31/20 0225 08/01/20 0103  AST 43* 35 28 34  ALT 77* 61* 49* 48*  ALKPHOS 94 105 102 103  BILITOT 0.9 0.8 0.7 0.7  PROT 6.7 6.7 6.1* 6.5  ALBUMIN 2.3* 2.4* 2.3* 2.5*    HbA1C: Hgb A1c MFr Bld  Date/Time Value Ref Range Status  07/28/2020 03:57 AM 7.5 (H) 4.8 - 5.6 % Final    Comment:    (NOTE) Pre diabetes:          5.7%-6.4%  Diabetes:              >6.4%  Glycemic control for   <7.0% adults with diabetes   12/08/2019 04:35 AM 7.1 (H) 4.8 - 5.6 % Final    Comment:    (NOTE) Pre diabetes:          5.7%-6.4% Diabetes:              >6.4% Glycemic control for   <7.0% adults with diabetes     CBG: Recent Labs  Lab 08/02/20 0926 08/02/20 1130 08/02/20 1557 08/02/20 2136 08/03/20 0803  GLUCAP 193* 275* 250* 266* 270*    Recent Results (from the past 240 hour(s))  Blood Culture (routine x 2)     Status: None   Collection Time: 07/27/20  1:50 AM   Specimen: BLOOD LEFT ARM  Result  Value Ref Range Status   Specimen Description BLOOD LEFT ARM  Final   Special Requests   Final    BOTTLES DRAWN AEROBIC AND ANAEROBIC Blood Culture results may not be optimal due to an inadequate volume of blood received in culture bottles   Culture   Final    NO GROWTH 5 DAYS Performed at [facility], [address] [place], [place] [address]    Report Status 08/01/2020 FINAL  Final  MRSA PCR Screening     Status: None   Collection Time: 07/28/20  4:47 PM   Specimen: Nasal Mucosa; Nasopharyngeal  Result Value Ref Range Status   MRSA by PCR NEGATIVE NEGATIVE Final    Comment:        The GeneXpert MRSA Assay (FDA approved for NASAL specimens only), is one component of a comprehensive MRSA colonization surveillance program. It is not intended to diagnose MRSA infection nor to guide or monitor treatment for MRSA infections. Performed at [facility], [address], [place], [place] [address]      Scheduled Meds: . aspirin EC  81 mg Oral Daily  . carvedilol  6.25 mg Oral BID WC  . Chlorhexidine Gluconate Cloth  6 each Topical Daily  . docusate sodium  100 mg Oral BID  . enoxaparin (LOVENOX) injection  0.5 mg/kg Subcutaneous Q24H  . famotidine  20 mg Oral Daily  . feeding supplement (GLUCERNA SHAKE)  237 mL Oral TID BM  . furosemide  60 mg Intravenous Daily  . insulin aspart  0-20 Units Subcutaneous TID WC  . insulin aspart  0-5 Units Subcutaneous QHS  . insulin aspart  8 Units Subcutaneous TID WC  . insulin detemir  20 Units Subcutaneous BID  . mouth rinse  15 mL Mouth Rinse BID  . methylPREDNISolone (SOLU-MEDROL) injection  80 mg Intravenous Q12H  . mometasone-formoterol  2 puff Inhalation BID  . polyethylene glycol  17 g Oral Daily  . senna  1 tablet Oral QHS   Continuous Infusions: . sodium chloride 75 mL/hr at 07/29/20 0600     LOS: 7 days   Osvaldo Shipper  [facility] Office  [phone] Pager - Text Page per Loretha Stapler  If 7PM-7AM, please  contact night-coverage per Amion 08/03/2020, 10:53 AM

## 2020-08-03 NOTE — Progress Notes (Signed)
Patient received to unit via wheelchair from ER, alert oriented x4, oriented to unit, assessment completed, patient connected to monitors, v/s obtained, no s/s of distress at this time.

## 2020-08-04 DIAGNOSIS — E1165 Type 2 diabetes mellitus with hyperglycemia: Secondary | ICD-10-CM

## 2020-08-04 DIAGNOSIS — J1282 Pneumonia due to coronavirus disease 2019: Secondary | ICD-10-CM

## 2020-08-04 DIAGNOSIS — I5043 Acute on chronic combined systolic (congestive) and diastolic (congestive) heart failure: Secondary | ICD-10-CM

## 2020-08-04 LAB — GLUCOSE, CAPILLARY
Glucose-Capillary: 238 mg/dL — ABNORMAL HIGH (ref 70–99)
Glucose-Capillary: 182 mg/dL — ABNORMAL HIGH (ref 70–99)
Glucose-Capillary: 330 mg/dL — ABNORMAL HIGH (ref 70–99)
Glucose-Capillary: 270 mg/dL — ABNORMAL HIGH (ref 70–99)

## 2020-08-04 LAB — BASIC METABOLIC PANEL
Anion gap: 8 (ref 5–15)
BUN: 36 mg/dL — ABNORMAL HIGH (ref 6–20)
CO2: 29 mmol/L (ref 22–32)
Calcium: 9.1 mg/dL (ref 8.9–10.3)
Chloride: 96 mmol/L — ABNORMAL LOW (ref 98–111)
Creatinine, Ser: 1.14 mg/dL — ABNORMAL HIGH (ref 0.44–1.00)
GFR, Estimated: 57 mL/min — ABNORMAL LOW (ref >60)
Glucose, Bld: 242 mg/dL — ABNORMAL HIGH (ref 70–99)
Potassium: 5.3 mmol/L — ABNORMAL HIGH (ref 3.5–5.1)
Sodium: 133 mmol/L — ABNORMAL LOW (ref 135–145)

## 2020-08-04 MED ORDER — SODIUM ZIRCONIUM CYCLOSILICATE 10 G PO PACK
10.0000 g | PACK | Freq: Two times a day (BID) | ORAL | Status: AC
  Administered 2020-08-04 (×2): 10 g via ORAL
  Filled 2020-08-04 (×2): qty 1

## 2020-08-04 NOTE — Progress Notes (Signed)
Maria Duncan  POE:[id number] DOB: 1966-06-17 DOA: 07/27/2020 PCP: Pcp, No    Brief Narrative:  55 year old with a history of HTN, obesity, pulmonary hypertension, OSA/OHS on CPAP, and combined systolic/diastolic CHF who presented to the ED with worsening shortness of breath.  She was diagnosed with Covid 12/28.  Since that time she has had progressively worsening shortness of breath, and some intermittent diarrhea.  In the ED she was found to be hypoxic and required 10 L oxygen support to keep saturations at 90% or better.  CXR noted extensive bilateral pulmonary infiltrates.  Significant Events:  12/28 diagnosed with Covid 1/3 admit via [place] 1/3 lower extremity venous duplex negative for DVT bilaterally  Date of Positive COVID Test:  12/28  Vaccination Status: Unvaccinated  COVID-19 specific Treatment: Remdesivir 1/3 > Solu-Medrol 1/3 > Actemra 1/3  Antimicrobials:  Rocephin Azithromycin  DVT prophylaxis: Lovenox  Subjective: Patient mentions that she continues to feel better.  Little bit of blood was noted in her stool this morning.  She denies any abdominal pain nausea vomiting.  No rectal pain.  Cough is better.  Shortness of breath is improving.     Assessment & Plan:  Acute Hypoxic Resp. Failure/Pneumonia due to COVID-19   Recent Labs  Lab 07/29/20 0350 07/30/20 0225 07/31/20 0225 08/01/20 0103  DDIMER 2.05* 1.99* 1.98* 2.01*  FERRITIN 4,094* 2,737*  --   --   CRP 14.5* 10.6* 5.9* 3.5*  ALT 77* 61* 49* 48*  PROCALCITON 0.37 0.27  --   --     Objective findings: Oxygen requirements: Remains on heated high flow at 20 L/min, 40% FiO2.  Saturations in the early 90s.    COVID 19 Therapeutics: Antibacterials: Completed 5-day course of ceftriaxone and azithromycin.  Procalcitonin noted to be elevated at 0.83.  Improved to 0.27. Remdesivir: Completed 5-day course Steroids: Solu-Medrol Diuretics: Lasix once daily Inhaled Steroids: Dulera Actemra: Patient  was given Actemra PUD Prophylaxis: famotidine DVT Prophylaxis:  Lovenox.  From a respiratory standpoint patient has been improving very slowly but steadily.  Oxygen requirements are coming down slowly.  Work of breathing has improved.  She completed course of Remdesivir.  She was given Actemra x1.  She remains on steroid.  Inflammatory markers had improved.  Chest x-ray done yesterday morning shows slight improvement.  Continue to monitor.  D-dimer is stable.  Patient was noted to have elevated D-dimer.  She underwent Doppler studies which did not show any DVT.  Due to elevated creatinine she did not undergo CT scan.  Elevated D-dimer most likely due to COVID-19.  She was changed over to prophylactic doses of Lovenox.    The treatment plan and use of medications and known side effects were discussed with patient/family. Some of the medications used are based on case reports/anecdotal data.  All other medications being used in the management of COVID-19 based on limited study data.  Complete risks and long-term side effects are unknown, however in the best clinical judgment they seem to be of some benefit.  Patient/family wanted to proceed with treatment options provided.  Transaminitis Secondary to COVID-19.  Noted to be improving.    Acute renal failure/hyperkalemia/hyponatremia Baseline creatinine around 1.2.  Presented with a creatinine of 2.04.  Likely due to combination of acute infection and cardiorenal.  Renal function slowly improving.  Monitor urine output.   Pain is down to 1.14.  Potassium again noted to be elevated.  Will give 2 doses of Lokelma today.  Recheck tomorrow.  Spironolactone  was discontinued.  Remains on once daily furosemide for   OSA/OHS On nightly CPAP at home but unable to tolerate in hospital due to hypoxia  Chronic combined systolic and diastolic congestive heart failure EF 35-40% via TTE May 2021.  Followed in the CHF clinic.  Holding [place] and [place] due to  renal failure.  Remains on furosemide once daily.  Spironolactone was discontinued due to hyperkalemia.   Strict ins and outs and daily weights. Stable for the most part.  Diuresing well.  Diabetes mellitus type 2, uncontrolled with hyperglycemia HbA1c 7.5.  Elevated CBGs due to steroids.  Patient started on Levemir.  CBGs still elevated.  Dose of Levemir being adjusted depending on CBGs.  Constipation with mild hematochezia Small amount of blood was noted mixed in stool this morning.  Patient has been constipated for the last several days.  Could be due to mucosal irritation.  Discussed with nursing staff and patient.  We will continue to watch for now.  Check hemoglobin tomorrow.  May need further work-up if significant bleeding is noted.  Obesity Estimated body mass index is 38.81 kg/m as calculated from the following:   Height as of this encounter: 5\' 5"  (1.651 m).   Weight as of this encounter: 105.8 kg.    DVT prophylaxis: On Lovenox.   Code Status: FULL CODE Family Communication: Sister being updated every other day. Disposition: Hopefully return home when improved.    Status is: Inpatient  Remains inpatient appropriate because:Inpatient level of care appropriate due to severity of illness   Dispo: The patient is from: Home              Anticipated d/c is to: Home              Anticipated d/c date is: > 3 days              Patient currently is not medically stable to d/c.   Consultants:  none  Objective: Blood pressure (!) 129/94, pulse 87, temperature 98 F (36.7 C), temperature source Oral, resp. rate 20, height 5\' 5"  (1.651 m), weight 105.8 kg, SpO2 95 %.  Intake/Output Summary (Last 24 hours) at 08/04/2020 1109 Last data filed at 08/04/2020 0931 Gross per 24 hour  Intake 640 ml  Output 1100 ml  Net -460 ml   Filed Weights   07/31/20 0500 08/01/20 0500 08/02/20 0500  Weight: 107 kg 108.4 kg 105.8 kg     Examination:  General appearance: Awake alert.  In  no distress Resp: Improved effort.  Less tachypneic.  Crackles bilateral bases.  No wheezing or rhonchi.  Cardio: S1-S2 is normal regular.  No S3-S4.  No rubs murmurs or bruit GI: Abdomen is soft.  Nontender nondistended.  Bowel sounds are present normal.  No masses organomegaly Extremities: No edema.  Full range of motion of lower extremities. Neurologic: Alert and oriented x3.  No focal neurological deficits.       CBC: Recent Labs  Lab 07/30/20 0225 07/31/20 0225 08/01/20 0103 08/03/20 0217  WBC 20.8* 25.5* 25.3* 21.5*  NEUTROABS 19.6* 19.9* 19.1*  --   HGB 12.2 12.8 12.9 12.5  HCT 37.5 39.9 40.6 39.4  MCV 86.2 87.5 87.7 88.3  PLT 396 365 396 367   Basic Metabolic Panel: Recent Labs  Lab 07/31/20 0225 08/01/20 0103 08/02/20 0304 08/03/20 0217 08/04/20 0225  NA 137 137 136 133* 133*  K 5.1 5.0 5.5* 5.2* 5.3*  CL 100 95* 98 97* 96*  CO2 26  31 29 27 29   GLUCOSE 276* 221* 241* 257* 242*  BUN 48* 42* 38* 39* 36*  CREATININE 1.59* 1.48* 1.36* 1.22* 1.14*  CALCIUM 9.0 9.2 9.0 8.9 9.1  MG 2.2 2.2 2.2  --   --    GFR: Estimated Creatinine Clearance: 68.1 mL/min (A) (by C-G formula based on SCr of 1.14 mg/dL (H)).  Liver Function Tests: Recent Labs  Lab 07/29/20 0350 07/30/20 0225 07/31/20 0225 08/01/20 0103  AST 43* 35 28 34  ALT 77* 61* 49* 48*  ALKPHOS 94 105 102 103  BILITOT 0.9 0.8 0.7 0.7  PROT 6.7 6.7 6.1* 6.5  ALBUMIN 2.3* 2.4* 2.3* 2.5*    HbA1C: Hgb A1c MFr Bld  Date/Time Value Ref Range Status  07/28/2020 03:57 AM 7.5 (H) 4.8 - 5.6 % Final    Comment:    (NOTE) Pre diabetes:          5.7%-6.4%  Diabetes:              >6.4%  Glycemic control for   <7.0% adults with diabetes   12/08/2019 04:35 AM 7.1 (H) 4.8 - 5.6 % Final    Comment:    (NOTE) Pre diabetes:          5.7%-6.4% Diabetes:              >6.4% Glycemic control for   <7.0% adults with diabetes     CBG: Recent Labs  Lab 08/03/20 0803 08/03/20 1203 08/03/20 1548  08/03/20 2042 08/04/20 0754  GLUCAP 270* 185* 247* 314* 238*    Recent Results (from the past 240 hour(s))  Blood Culture (routine x 2)     Status: None   Collection Time: 07/27/20  1:50 AM   Specimen: BLOOD LEFT ARM  Result Value Ref Range Status   Specimen Description BLOOD LEFT ARM  Final   Special Requests   Final    BOTTLES DRAWN AEROBIC AND ANAEROBIC Blood Culture results may not be optimal due to an inadequate volume of blood received in culture bottles   Culture   Final    NO GROWTH 5 DAYS Performed at [facility], [address], [place], [place]    Report Status 08/01/2020 FINAL  Final  MRSA PCR Screening     Status: None   Collection Time: 07/28/20  4:47 PM   Specimen: Nasal Mucosa; Nasopharyngeal  Result Value Ref Range Status   MRSA by PCR NEGATIVE NEGATIVE Final    Comment:        The GeneXpert MRSA Assay (FDA approved for NASAL specimens only), is one component of a comprehensive MRSA colonization surveillance program. It is not intended to diagnose MRSA infection nor to guide or monitor treatment for MRSA infections. Performed at [facility], [address] [place], [place]      Scheduled Meds: . aspirin EC  81 mg Oral Daily  . carvedilol  6.25 mg Oral BID WC  . docusate sodium  100 mg Oral BID  . enoxaparin (LOVENOX) injection  0.5 mg/kg Subcutaneous Q24H  . famotidine  20 mg Oral Daily  . feeding supplement (GLUCERNA SHAKE)  237 mL Oral TID BM  . furosemide  60 mg Intravenous Daily  . insulin aspart  0-20 Units Subcutaneous TID WC  . insulin aspart  0-5 Units Subcutaneous QHS  . insulin aspart  8 Units Subcutaneous TID WC  . insulin detemir  24 Units Subcutaneous BID  . mouth rinse  15 mL Mouth Rinse BID  . methylPREDNISolone (SOLU-MEDROL) injection  80 mg Intravenous Q12H  . mometasone-formoterol  2 puff Inhalation BID  . polyethylene glycol  17 g Oral Daily  . senna  1 tablet Oral QHS  . sodium zirconium  cyclosilicate  10 g Oral BID   Continuous Infusions: . sodium chloride 75 mL/hr at 07/29/20 0600     LOS: 8 days   Osvaldo Shipper  [facility] Office  [phone] Pager - Text Page per Loretha Stapler  If 7PM-7AM, please contact night-coverage per Amion 08/04/2020, 11:09 AM

## 2020-08-04 NOTE — Plan of Care (Signed)
  Problem: Clinical Measurements: Goal: Will remain free from infection Outcome: Progressing Goal: Respiratory complications will improve Outcome: Progressing   Problem: Activity: Goal: Risk for activity intolerance will decrease Outcome: Progressing   Problem: Education: Goal: Knowledge of risk factors and measures for prevention of condition will improve Outcome: Progressing   Problem: Coping: Goal: Psychosocial and spiritual needs will be supported Outcome: Progressing   Problem: Respiratory: Goal: Will maintain a patent airway Outcome: Progressing Goal: Complications related to the disease process, condition or treatment will be avoided or minimized Outcome: Progressing

## 2020-08-04 NOTE — Progress Notes (Signed)
Occupational Therapy Treatment Patient Details Name: Maria Duncan MRN: [id number] DOB: 1966/04/02 Today's Date: 08/04/2020    History of present illness 55 y.o. female admitted 1/3 with worsening SOB due to COVID -19 PNA. Diagnosed 12/28.  She developed ARF, Transaminitis and has required HHFNC. PMH includes: pulmonary HTN, OSA (CPAP), nonischemic cardiomyopathy, morbid obesity, HTN, DM, CHF   OT comments  Pt progressing towards OT goals, seated on BSC upon arrival with NT assist, very pleasant and motivated to work with therapy. Pt currently performing toileting ADL with minguard assist, completing mobility tasks with minguard assist to light minA (HHA). Pt tolerating x2 additional bouts of mobility after completion of toileting and transfer to EOB. With seated rest pt also engaging in bil UE and LE HEP, using level 2 theraband for UB. Pt on HHFNC (40%, 15L) with lowest O2 noted 86%, SpO2 rebounds quickly given seated rest and min cues for deep breathing techniques. HR up to the 110s during session. Anticipate continued, steady progress; will continue to follow acutely and continue per POC.    Follow Up Recommendations  No OT follow up;Supervision/Assistance - 24 hour (24hr initially)    Equipment Recommendations  Tub/shower bench          Precautions / Restrictions Precautions Precautions: Other (comment) Precaution Comments: HHFNC, monitor sats Restrictions Weight Bearing Restrictions: No       Mobility Bed Mobility Overal bed mobility: Needs Assistance Bed Mobility: Sit to Supine       Sit to supine: Supervision   General bed mobility comments: for lines  Transfers Overall transfer level: Needs assistance   Transfers: Sit to/from Stand;Stand Pivot Transfers Sit to Stand: Min guard Stand pivot transfers: Min guard       General transfer comment: for lines, balance and safety; sit<>stand from [facility] and x2 from EOB    Balance Overall balance assessment: Mild deficits  observed, not formally tested                                         ADL either performed or assessed with clinical judgement   ADL Overall ADL's : Needs assistance/impaired                         Toilet Transfer: Min Barrister's clerk Details (indicate cue type and reason): pt seated on BSC upon arrival with NT present, minguard for few steps to EOB after toilet Toileting- Clothing Manipulation and Hygiene: Min guard;Sit to/from stand;Sitting/lateral lean Toileting - Clothing Manipulation Details (indicate cue type and reason): pt performing both anterior and posterior pericare with minguard for lines, balance, and safety     Functional mobility during ADLs: Min guard;Minimal assistance General ADL Comments: x2 bouts of standing activity, initially forward/backward (within lines of HHFNC) and standing marching, again side steps along EOB (to L and R)     Vision       Perception     Praxis      Cognition Arousal/Alertness: Awake/alert Behavior During Therapy: WFL for tasks assessed/performed Overall Cognitive Status: Within Functional Limits for tasks assessed                                          Exercises Exercises: Other exercises;General Upper Extremity;General Lower Extremity General Exercises - Upper  Extremity Shoulder Flexion: AROM;Both;10 reps;Seated;Theraband Theraband Level (Shoulder Flexion): Level 2 (Red) Shoulder Horizontal ABduction: AROM;Both;10 reps;Seated;Theraband Theraband Level (Shoulder Horizontal Abduction): Level 2 (Red) Shoulder Horizontal ADduction: AROM;Both;10 reps;Seated;Theraband Theraband Level (Shoulder Horizontal Adduction): Level 2 (Red) Elbow Flexion: AROM;Both;10 reps;Seated;Theraband Theraband Level (Elbow Flexion): Level 2 (Red) Elbow Extension: AROM;Both;10 reps;Seated;Theraband Theraband Level (Elbow Extension): Level 2 (Red) General Exercises - Lower  Extremity Hip Flexion/Marching: AROM;Both;10 reps;Standing (with HHA for stability) Other Exercises Other Exercises: practicing deep breathing techniques   Shoulder Instructions       General Comments      Pertinent Vitals/ Pain       Pain Assessment: No/denies pain  Home Living                                          Prior Functioning/Environment              Frequency  Min 2X/week        Progress Toward Goals  OT Goals(current goals can now be found in the care plan section)  Progress towards OT goals: Progressing toward goals  Acute Rehab OT Goals Patient Stated Goal: to go back to work OT Goal Formulation: With patient Time For Goal Achievement: 08/16/20 Potential to Achieve Goals: Good ADL Goals Pt Will Perform Grooming: with modified independence;standing Pt Will Perform Upper Body Bathing: with modified independence;sitting Pt Will Perform Lower Body Bathing: with modified independence;sit to/from stand Pt Will Perform Upper Body Dressing: with modified independence;sitting Pt Will Perform Lower Body Dressing: with modified independence;sit to/from stand Pt Will Transfer to Toilet: with modified independence;ambulating;regular height toilet;grab bars Pt Will Perform Toileting - Clothing Manipulation and hygiene: with modified independence;sit to/from stand Pt Will Perform Tub/Shower Transfer: Tub transfer;with min guard assist;ambulating;tub bench Pt/caregiver will Perform Home Exercise Program: Increased strength;Right Upper extremity;Left upper extremity;With theraband;With written HEP provided;Independently Additional ADL Goal #1: Pt will perform bathing and dressing with no more than 3 rest breaks and sp02 no < 90% Additional ADL Goal #2: Pt will independently incorporate energy conservation strategies during ADLs  Plan Discharge plan remains appropriate    Co-evaluation                 AM-PAC OT "6 Clicks" Daily Activity      Outcome Measure   Help from another person eating meals?: A Little Help from another person taking care of personal grooming?: A Little Help from another person toileting, which includes using toliet, bedpan, or urinal?: A Little Help from another person bathing (including washing, rinsing, drying)?: A Little Help from another person to put on and taking off regular upper body clothing?: A Little Help from another person to put on and taking off regular lower body clothing?: A Little 6 Click Score: 18    End of Session Equipment Utilized During Treatment: Oxygen  OT Visit Diagnosis: Unsteadiness on feet (R26.81)   Activity Tolerance Patient tolerated treatment well   Patient Left in bed;with call bell/phone within reach   Nurse Communication Mobility status        Time: 2703-5009 OT Time Calculation (min): 30 min  Charges: OT General Charges $OT Visit: 1 Visit OT Treatments $Self Care/Home Management : 8-22 mins $Therapeutic Activity: 8-22 mins  Marcy Siren, OT Acute Rehabilitation Services Pager [phone] Office [phone]   Orlando Penner 08/04/2020, 2:57 PM

## 2020-08-05 LAB — CBC
HCT: 36.8 % (ref 36.0–46.0)
Hemoglobin: 11.9 g/dL — ABNORMAL LOW (ref 12.0–15.0)
MCH: 28.4 pg (ref 26.0–34.0)
MCHC: 32.3 g/dL (ref 30.0–36.0)
MCV: 87.8 fL (ref 80.0–100.0)
Platelets: 324 10*3/uL (ref 150–400)
RBC: 4.19 MIL/uL (ref 3.87–5.11)
RDW: 14 % (ref 11.5–15.5)
WBC: 14.9 10*3/uL — ABNORMAL HIGH (ref 4.0–10.5)
nRBC: 0.1 % (ref 0.0–0.2)

## 2020-08-05 LAB — BASIC METABOLIC PANEL
Anion gap: 8 (ref 5–15)
BUN: 37 mg/dL — ABNORMAL HIGH (ref 6–20)
CO2: 32 mmol/L (ref 22–32)
Calcium: 9.1 mg/dL (ref 8.9–10.3)
Chloride: 94 mmol/L — ABNORMAL LOW (ref 98–111)
Creatinine, Ser: 1.25 mg/dL — ABNORMAL HIGH (ref 0.44–1.00)
GFR, Estimated: 51 mL/min — ABNORMAL LOW (ref >60)
Glucose, Bld: 207 mg/dL — ABNORMAL HIGH (ref 70–99)
Potassium: 5.2 mmol/L — ABNORMAL HIGH (ref 3.5–5.1)
Sodium: 134 mmol/L — ABNORMAL LOW (ref 135–145)

## 2020-08-05 LAB — GLUCOSE, CAPILLARY
Glucose-Capillary: 185 mg/dL — ABNORMAL HIGH (ref 70–99)
Glucose-Capillary: 178 mg/dL — ABNORMAL HIGH (ref 70–99)
Glucose-Capillary: 228 mg/dL — ABNORMAL HIGH (ref 70–99)
Glucose-Capillary: 247 mg/dL — ABNORMAL HIGH (ref 70–99)

## 2020-08-05 MED ORDER — METHYLPREDNISOLONE SODIUM SUCC 40 MG IJ SOLR
40.0000 mg | Freq: Two times a day (BID) | INTRAMUSCULAR | Status: AC
  Administered 2020-08-05 – 2020-08-06 (×3): 40 mg via INTRAVENOUS
  Filled 2020-08-05 (×3): qty 1

## 2020-08-05 NOTE — Progress Notes (Signed)
PT Cancellation Note  Patient Details Name: Maria Duncan MRN: [id number] DOB: [birth year]   Cancelled Treatment:    Reason Eval/Treat Not Completed: (P) Patient declined, no reason specified (Pt refused PT session.  Currently on the phone attempting to track her "[employer] eats" order.  PTA provided encouragement and she continues to decline as she wantsto know where he order is at this time.)   Florestine Avers 08/05/2020, 5:31 PM  Bonney Leitz , PTA Acute Rehabilitation Services Pager [phone] Office [phone]

## 2020-08-05 NOTE — Progress Notes (Signed)
RT note. Pt. Placed on 7L salter at this time. Pt. Tolerating sat 98%, VS stable at this moment, RT will continue to monitor.

## 2020-08-05 NOTE — Progress Notes (Signed)
Maria Duncan  YPP:[id number] DOB: 06/11/1966 DOA: 07/27/2020 PCP: Pcp, No    Brief Narrative:  55 year old with a history of HTN, obesity, pulmonary hypertension, OSA/OHS on CPAP, and combined systolic/diastolic CHF who presented to the ED with worsening shortness of breath.  She was diagnosed with Covid 12/28.  Since that time she has had progressively worsening shortness of breath, and some intermittent diarrhea.  In the ED she was found to be hypoxic and required 10 L oxygen support to keep saturations at 90% or better.  CXR noted extensive bilateral pulmonary infiltrates.  Significant Events:  12/28 diagnosed with Covid 1/3 admit via [place] 1/3 lower extremity venous duplex negative for DVT bilaterally  Date of Positive COVID Test:  12/28  Vaccination Status: Unvaccinated  COVID-19 specific Treatment: Remdesivir 1/3 > 1/7 Solu-Medrol 1/3 > 1/13 Actemra 1/3 x1 dose  Antimicrobials:  Rocephin completed 1/7 Azithromycin completed 1/6  DVT prophylaxis: Lovenox  Subjective: No acute issues or events overnight, feels quite well, denies nausea, vomiting, diarrhea, constipation, headache, fevers, chills.  Dyspnea with exertion ongoing but markedly improving, still requiring moderate amounts of oxygen at rest as well as with exertion to maintain sats above 88%.   Assessment & Plan:  Acute Hypoxic Resp. Failure/Pneumonia due to COVID-19 Recent Labs  Lab 07/30/20 0225 07/31/20 0225 08/01/20 0103  DDIMER 1.99* 1.98* 2.01*  FERRITIN 2,737*  --   --   CRP 10.6* 5.9* 3.5*  ALT 61* 49* 48*  PROCALCITON 0.27  --   --     Objective findings: Oxygen requirements: SpO2: 95 % O2 Flow Rate (L/min): (S) 7 L/min FiO2 (%): 40 %     COVID 19 Therapeutics: Antibacterials: Completed 5-day course of ceftriaxone and azithromycin.  Procalcitonin noted to be elevated at 0.83.  Improved to 0.27. Remdesivir: Completed 5-day course Steroids: Solu-Medrol Diuretics: Lasix once  daily Inhaled Steroids: Dulera Actemra: Patient was given Actemra PUD Prophylaxis: famotidine DVT Prophylaxis:  Lovenox.  Continues to improve clinically, currently on 7 L nasal cannula at rest, desats profoundly with exertion but dyspnea improving daily Patient has completed remdesivir, azithromycin, ceftriaxone Actemra x1 at admission  Continue steroids, continue to wean as tolerated  Chest x-ray improving D-dimer minimally elevated with negative DVT study  Elevated LFTs, mild, resolved Secondary to COVID-19.  Noted to be improving per previous labs.    Acute renal failure/hyperkalemia/hyponatremia, resolving Baseline creatinine around 1.2.  Presented with a creatinine of 2.04.  Likely due to combination of acute infection and cardiorenal.   Renal function slowly improving.  Monitor urine output.   Potassium minimally elevated at 5.2 - recheck tomorrow.  Spironolactone previously discontinued.  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  Followed in the CHF clinic.  Holding [place] and [place] due to renal failure.  Remains on furosemide once daily.  Spironolactone was discontinued due to hyperkalemia.   Strict ins and outs and daily weights. Stable for the most part.  Diuresing well.  Diabetes mellitus type 2, uncontrolled with hyperglycemia HbA1c 7.5.  Elevated CBGs due to steroids.  Patient started on Levemir.  CBGs still elevated.  Dose of Levemir being adjusted depending on CBGs.  Constipation with mild hematochezia Small amount of blood was noted mixed in stool this morning.  Patient has been constipated for the last several days.  Could be due to mucosal  irritation.  Discussed with nursing staff and patient.  We will continue to watch for now.  Check hemoglobin tomorrow.  May need further work-up if significant  bleeding is noted.  Obesity Estimated body mass index is 38.81 kg/m as calculated from the following:   Height as of this encounter: 5\' 5"  (1.651 m).   Weight as of this encounter: 105.8 kg.    DVT prophylaxis: On Lovenox.   Code Status: FULL CODE Family Communication: Sister being updated every other day. Disposition: Hopefully return home when improved.    Status is: Inpatient  Remains inpatient appropriate because:Inpatient level of care appropriate due to severity of illness   Dispo: The patient is from: Home              Anticipated d/c is to: Home              Anticipated d/c date is: > 3 days              Patient currently is not medically stable to d/c.   Consultants:  none  Objective: Blood pressure (!) 144/85, pulse 67, temperature 98 F (36.7 C), temperature source Oral, resp. rate 14, height 5\' 5"  (1.651 m), weight 105.8 kg, SpO2 95 %.  Intake/Output Summary (Last 24 hours) at 08/05/2020 0707 Last data filed at 08/04/2020 1851 Gross per 24 hour  Intake 720 ml  Output 600 ml  Net 120 ml   Filed Weights   07/31/20 0500 08/01/20 0500 08/02/20 0500  Weight: 107 kg 108.4 kg 105.8 kg     Examination:  General appearance: Awake alert.  In no distress Resp: Improved effort.  Less tachypneic.  Crackles bilateral bases.  No wheezing or rhonchi.  Cardio: S1-S2 is normal regular.  No S3-S4.  No rubs murmurs or bruit GI: Abdomen is soft.  Nontender nondistended.  Bowel sounds are present normal.  No masses organomegaly Extremities: No edema.  Full range of motion of lower extremities. Neurologic: Alert and oriented x3.  No focal neurological deficits.     CBC: Recent Labs  Lab 07/30/20 0225 07/31/20 0225 08/01/20 0103 08/03/20 0217 08/05/20 0141  WBC 20.8* 25.5* 25.3* 21.5* 14.9*  NEUTROABS 19.6* 19.9* 19.1*  --   --   HGB 12.2 12.8 12.9 12.5 11.9*  HCT 37.5 39.9 40.6 39.4 36.8  MCV 86.2 87.5 87.7 88.3 87.8  PLT 396 365 396 367 324   Basic Metabolic  Panel: Recent Labs  Lab 07/31/20 0225 08/01/20 0103 08/02/20 0304 08/03/20 0217 08/04/20 0225 08/05/20 0141  NA 137 137 136 133* 133* 134*  K 5.1 5.0 5.5* 5.2* 5.3* 5.2*  CL 100 95* 98 97* 96* 94*  CO2 26 31 29 27 29  32  GLUCOSE 276* 221* 241* 257* 242* 207*  BUN 48* 42* 38* 39* 36* 37*  CREATININE 1.59* 1.48* 1.36* 1.22* 1.14* 1.25*  CALCIUM 9.0 9.2 9.0 8.9 9.1 9.1  MG 2.2 2.2 2.2  --   --   --    GFR: Estimated Creatinine Clearance: 62.1 mL/min (A) (by C-G formula based on SCr of 1.25 mg/dL (H)).  Liver Function Tests: Recent Labs  Lab 07/30/20 0225 07/31/20 0225 08/01/20 0103  AST 35 28 34  ALT 61* 49* 48*  ALKPHOS 105 102 103  BILITOT 0.8 0.7 0.7  PROT 6.7 6.1* 6.5  ALBUMIN 2.4* 2.3* 2.5*    HbA1C: Hgb A1c MFr Bld  Date/Time Value Ref Range Status  07/28/2020 03:57 AM 7.5 (H) 4.8 - 5.6 % Final  Comment:    (NOTE) Pre diabetes:          5.7%-6.4%  Diabetes:              >6.4%  Glycemic control for   <7.0% adults with diabetes   12/08/2019 04:35 AM 7.1 (H) 4.8 - 5.6 % Final    Comment:    (NOTE) Pre diabetes:          5.7%-6.4% Diabetes:              >6.4% Glycemic control for   <7.0% adults with diabetes     CBG: Recent Labs  Lab 08/03/20 2042 08/04/20 0754 08/04/20 1155 08/04/20 1748 08/04/20 2056  GLUCAP 314* 238* 182* 330* 270*    Recent Results (from the past 240 hour(s))  Blood Culture (routine x 2)     Status: None   Collection Time: 07/27/20  1:50 AM   Specimen: BLOOD LEFT ARM  Result Value Ref Range Status   Specimen Description BLOOD LEFT ARM  Final   Special Requests   Final    BOTTLES DRAWN AEROBIC AND ANAEROBIC Blood Culture results may not be optimal due to an inadequate volume of blood received in culture bottles   Culture   Final    NO GROWTH 5 DAYS Performed at [facility], [address] [place], [place] [address]    Report Status 08/01/2020 FINAL  Final  MRSA PCR Screening     Status: None   Collection  Time: 07/28/20  4:47 PM   Specimen: Nasal Mucosa; Nasopharyngeal  Result Value Ref Range Status   MRSA by PCR NEGATIVE NEGATIVE Final    Comment:        The GeneXpert MRSA Assay (FDA approved for NASAL specimens only), is one component of a comprehensive MRSA colonization surveillance program. It is not intended to diagnose MRSA infection nor to guide or monitor treatment for MRSA infections. Performed at [facility], [address], [place], [place] [address]      Scheduled Meds: . aspirin EC  81 mg Oral Daily  . carvedilol  6.25 mg Oral BID WC  . docusate sodium  100 mg Oral BID  . enoxaparin (LOVENOX) injection  0.5 mg/kg Subcutaneous Q24H  . famotidine  20 mg Oral Daily  . feeding supplement (GLUCERNA SHAKE)  237 mL Oral TID BM  . furosemide  60 mg Intravenous Daily  . insulin aspart  0-20 Units Subcutaneous TID WC  . insulin aspart  0-5 Units Subcutaneous QHS  . insulin aspart  8 Units Subcutaneous TID WC  . insulin detemir  24 Units Subcutaneous BID  . mouth rinse  15 mL Mouth Rinse BID  . methylPREDNISolone (SOLU-MEDROL) injection  80 mg Intravenous Q12H  . mometasone-formoterol  2 puff Inhalation BID  . polyethylene glycol  17 g Oral Daily  . senna  1 tablet Oral QHS   Continuous Infusions: . sodium chloride 75 mL/hr at 07/29/20 0600     LOS: 9 days   Azucena Fallen DO  [facility] Pager - Secure chat/Epic messenger  If 7PM-7AM, please contact night-coverage per Amion 08/05/2020, 7:07 AM

## 2020-08-05 NOTE — Progress Notes (Signed)
SATURATION QUALIFICATIONS: (This note is used to comply with regulatory documentation for home oxygen)  Patient Saturations on Room Air at Rest = 84%  Patient Saturations on Room Air while Ambulating = 79%  Patient Saturations on 6 Liters of oxygen while Ambulating = 90%  Please briefly explain why patient needs home oxygen:

## 2020-08-06 LAB — BASIC METABOLIC PANEL
Anion gap: 10 (ref 5–15)
BUN: 39 mg/dL — ABNORMAL HIGH (ref 6–20)
CO2: 29 mmol/L (ref 22–32)
Calcium: 9 mg/dL (ref 8.9–10.3)
Chloride: 96 mmol/L — ABNORMAL LOW (ref 98–111)
Creatinine, Ser: 1.24 mg/dL — ABNORMAL HIGH (ref 0.44–1.00)
GFR, Estimated: 52 mL/min — ABNORMAL LOW (ref >60)
Glucose, Bld: 318 mg/dL — ABNORMAL HIGH (ref 70–99)
Potassium: 4.9 mmol/L (ref 3.5–5.1)
Sodium: 135 mmol/L (ref 135–145)

## 2020-08-06 LAB — GLUCOSE, CAPILLARY
Glucose-Capillary: 195 mg/dL — ABNORMAL HIGH (ref 70–99)
Glucose-Capillary: 271 mg/dL — ABNORMAL HIGH (ref 70–99)
Glucose-Capillary: 254 mg/dL — ABNORMAL HIGH (ref 70–99)
Glucose-Capillary: 300 mg/dL — ABNORMAL HIGH (ref 70–99)

## 2020-08-06 NOTE — Progress Notes (Signed)
SATURATION QUALIFICATIONS: (This note is used to comply with regulatory documentation for home oxygen)  Patient Saturations on Room Air at Rest =95%  Patient Saturations on Room Air while Ambulating =  Unable  %  Patient Saturations on 5 Liters of oxygen while Ambulating = 89 %  Please briefly explain why patient needs home oxygen:  Patient baseline 4L O2 at home

## 2020-08-06 NOTE — TOC Initial Note (Signed)
Transition of Care [facility]) - Initial/Assessment Note    Patient Details  Name: Maria Duncan MRN: [id number] Date of Birth: 1965-09-30  [facility] of [facility]) CM/SW Contact:    Lockie Pares, RN Phone Number: 08/06/2020, 8:04 AM  Clinical Narrative:                 COVID Pneumonia, was on HFNC, now at 6L wil evaluate if patient needs  [facility] for medications and charity oxygen. CM will follow for needs. Has seen [facility]W previously Will make a appt at post COVID clinic.   Expected Discharge Plan: Home w Home Health Services Barriers to Discharge: Continued Medical Work up   Patient Goals and CMS Choice        Expected Discharge Plan and Services Expected Discharge Plan: Home w Home Health Services   Discharge Planning Services: CM Consult   Living arrangements for the past 2 months: Single Family Home                                      Prior Living Arrangements/Services Living arrangements for the past 2 months: Single Family Home Lives with:: Spouse Patient language and need for interpreter reviewed:: Yes        Need for Family Participation in Patient Care: Yes (Comment) Care giver support system in place?: Yes (comment)   Criminal Activity/Legal Involvement Pertinent to Current Situation/Hospitalization: No - Comment as needed  Activities of Daily Living Home Assistive Devices/Equipment: CBG Meter ADL Screening (condition at time of admission) Patient's cognitive ability adequate to safely complete daily activities?: Yes Is the patient deaf or have difficulty hearing?: No Does the patient have difficulty seeing, even when wearing glasses/contacts?: No Does the patient have difficulty concentrating, remembering, or making decisions?: No Patient able to express need for assistance with ADLs?: Yes Does the patient have difficulty dressing or bathing?: No Independently performs ADLs?: Yes (appropriate for developmental age) Does the patient have difficulty  walking or climbing stairs?: No Weakness of Legs: None Weakness of Arms/Hands: None  Permission Sought/Granted                  Emotional Assessment       Orientation: : Oriented to Self,Oriented to Place,Oriented to  Time,Oriented to Situation Alcohol / Substance Use: Not Applicable Psych Involvement: No (comment)  Admission diagnosis:  Acute hypoxemic respiratory failure due to COVID-19 (HCC) [U07.1, J96.01] Pneumonia due to COVID-19 virus [U07.1, J12.82] Patient Active Problem List   Diagnosis Date Noted  . Pneumonia due to COVID-19 virus 07/28/2020  . Acute hypoxemic respiratory failure due to COVID-19 (HCC) 07/27/2020  . Combined congestive systolic and diastolic heart failure (HCC) 07/27/2020  . Acute on chronic combined systolic and diastolic CHF (congestive heart failure) (HCC) 01/24/2020  . Chest pain of uncertain etiology, non obstructive CAD, pain due to acute HF 12/12/2019  . Pulmonary hypertension, unspecified (HCC) 12/12/2019  . OSA (obstructive sleep apnea) 12/12/2019  . Hypoventilation associated with obesity (HCC) 12/12/2019  . CAD in native artery non obstructive on cath 12/09/19 12/12/2019  . NICM (nonischemic cardiomyopathy) (HCC)   . 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 Morbid obesity (HCC)   . CHF exacerbation (HCC) 12/07/2019  . Cardiomegaly 12/07/2019  . Essential hypertension 12/07/2019  . CHF (congestive heart failure) (HCC) 12/07/2019   PCP:  Pcp, No Pharmacy:   [facility] -  [place], [place] - [address] [place] [address] Phone: [phone] Fax: [phone]  [facility] - [place], [place] - [address] [facility]. [address] [place] [address] Phone: [phone] Fax: [phone]  [facility] [place], [place] - [address] [place] [address] Phone: [phone] Fax:  [phone]  [id number] [place], [place] - [address] [facility] [address] [place] [address] Phone: [phone] Fax: [phone]     Social Determinants of Health (SDOH) Interventions    Readmission Risk Interventions No flowsheet data found.

## 2020-08-06 NOTE — Progress Notes (Addendum)
Physical Therapy Treatment Patient Details Name: Maria Duncan MRN: [id number] DOB: 02-18-1966 Today's Date: 08/06/2020    History of Present Illness Pt is a 55 y.o. female dx with COVID-19 on 07/21/20, now admitted 07/27/20 with worsening SOB. Workup for ARF, transaminitis secondary to COVID-19. PMH includes pulmonary HTN, OSA (on CPAP), cardiomyopathy, obesity, HTN, DM, CHF.   PT Comments    Pt progressing well with mobility. Able to perform increased bouts of activity, including hallway ambulation and seated/standing therex. SpO2 >/88% on 4L O2 [employer] with activity; DOE up to 3/4, although pt with improving ability to perform slowed, pursed lip breathing to help control this. Pt preparing for d/c home this afternoon; educ re: activity recommendations, therex, energy conservation strategies (handout provided), IS/FV use at home, importance of mobility.    Follow Up Recommendations  No PT follow up     Equipment Recommendations   (home O2)    Recommendations for Other Services       Precautions / Restrictions Restrictions Weight Bearing Restrictions: No    Mobility  Bed Mobility               General bed mobility comments: Received sitting in recliner  Transfers Overall transfer level: Independent Equipment used: None Transfers: Sit to/from [employer] transfer comment: Repeated sit<>stands from recliner independent without UE support; good awareness of lines  Ambulation/Gait Ambulation/Gait assistance: Supervision Gait Distance (Feet): 440 Feet Assistive device: None Gait Pattern/deviations: Step-through pattern;Decreased stride length Gait velocity: Decreased   General Gait Details: Slow, steady gait without DME, pt able to manage O2 tank; supervision for lines/SpO2 monitoring; SpO2 down to 88% on 4L O2 [place]; pt with good awareness of needing to slow breathing, intermittent cues for pursed lip technique   Stairs Stairs:  (educ on energy conservation  and safety with stairs to second level; pt reports planning to stay on main level for a few days)           Wheelchair Mobility    Modified Rankin (Stroke Patients Only)       Balance Overall balance assessment: No apparent balance deficits (not formally assessed)                                          Cognition Arousal/Alertness: Awake/alert Behavior During Therapy: WFL for tasks assessed/performed Overall Cognitive Status: Within Functional Limits for tasks assessed                                        Exercises Other Exercises Other Exercises: 10x repeated sit<>stand (DOE 3/4 with this) Other Exercises: Seated LAQ, seated marching Other Exercises: IS x10 (pulling ~750 mL with good technique), FV x5 (cues for technique)    General Comments General comments (skin integrity, edema, etc.): Pt preparing for d/c home this afternoon; educ re: activity recommendations, therex, energy conservation strategies (handout provided), IS/FV use at home, importance of mobility      Pertinent Vitals/Pain Pain Assessment: No/denies pain    Home Living                      Prior Function            PT Goals (current goals can now be found in  the care plan section) Progress towards PT goals: Progressing toward goals    Frequency    Min 3X/week      PT Plan Discharge plan needs to be updated    Co-evaluation              AM-PAC PT "6 Clicks" Mobility   Outcome Measure  Help needed turning from your back to your side while in a flat bed without using bedrails?: None Help needed moving from lying on your back to sitting on the side of a flat bed without using bedrails?: None Help needed moving to and from a bed to a chair (including a wheelchair)?: None Help needed standing up from a chair using your arms (e.g., wheelchair or bedside chair)?: None Help needed to walk in hospital room?: A Little Help needed climbing 3-5  steps with a railing? : A Little 6 Click Score: 22    End of Session Equipment Utilized During Treatment: Oxygen Activity Tolerance: Patient tolerated treatment well Patient left: in chair;with call bell/phone within reach Nurse Communication: Mobility status PT Visit Diagnosis: Difficulty in walking, not elsewhere classified (R26.2);Muscle weakness (generalized) (M62.81)     Time: 2426-8341 PT Time Calculation (min) (ACUTE ONLY): 25 min  Charges:  $Therapeutic Exercise: 8-22 mins $Self Care/Home Management: 8-22                    Ina Homes, PT, DPT Acute Rehabilitation Services  Pager [phone] Office [phone]  Malachy Chamber 08/06/2020, 2:19 PM

## 2020-08-06 NOTE — Progress Notes (Signed)
Maria Duncan  TUU:[id number] DOB: 03/20/1966 DOA: 07/27/2020 PCP: Pcp, No    Brief Narrative:  55 year old with a history of HTN, obesity, pulmonary hypertension, OSA/OHS on CPAP, and combined systolic/diastolic CHF who presented to the ED with worsening shortness of breath.  She was diagnosed with Covid 12/28.  Since that time she has had progressively worsening shortness of breath, and some intermittent diarrhea.  In the ED she was found to be hypoxic and required 10 L oxygen support to keep saturations at 90% or better.  CXR noted extensive bilateral pulmonary infiltrates.  Significant Events:  12/28 diagnosed with Covid 1/3 admit via [employer] 1/3 lower extremity venous duplex negative for DVT bilaterally  Date of Positive COVID Test:  12/28  Vaccination Status: Unvaccinated  COVID-19 specific Treatment: Remdesivir 1/3 > 1/7 Solu-Medrol 1/3 > 1/13 Actemra 1/3 x1 dose  Antimicrobials:  Rocephin completed 1/7 Azithromycin completed 1/6  DVT prophylaxis: Lovenox  Subjective: No acute issues or events overnight, patient feels back to baseline, denies dyspnea even with exertion, nausea vomiting headache fevers or chills.  She continues to desat with ambulation despite supplemental oxygen into the 80s.  She is hopeful for discharge home in the next few days, continues to reach out to her husband who is also admitted under teaching service.  Assessment & Plan:  Acute Hypoxic Resp. Failure/Pneumonia due to COVID-19 Recent Labs  Lab 07/31/20 0225 08/01/20 0103  DDIMER 1.98* 2.01*  CRP 5.9* 3.5*  ALT 49* 48*    Objective findings: Oxygen requirements: SpO2: 90 % O2 Flow Rate (L/min): 5 L/min FiO2 (%): 40 %     COVID 19 Therapeutics: Antibacterials: Completed 5-day course of ceftriaxone and azithromycin.  Procalcitonin noted to be elevated at 0.83.  Improved to 0.27. Remdesivir: Completed 5-day course Steroids: Solu-Medrol Diuretics: Lasix once daily Inhaled Steroids:  Dulera Actemra: Patient was given Actemra PUD Prophylaxis: famotidine DVT Prophylaxis:  Lovenox.  Continues to improve clinically, currently on 5 L nasal cannula at rest, desats moderately with exertion but dyspnea essentially resolved today Patient has completed remdesivir, azithromycin, ceftriaxone, steroids Actemra x1 at admission  Chest x-ray improving D-dimer previously elevated with negative DVT study  Elevated LFTs, mild, resolved Secondary to COVID-19.  Noted to be improving per previous labs.    Acute renal failure/hyperkalemia/hyponatremia, resolving Baseline creatinine around 1.2.  Presented with a creatinine of 2.04.  Likely due to combination of acute infection and cardiorenal.   Renal function slowly improving.  Monitor urine output.   Potassium minimally elevated at 5.2 - recheck tomorrow.  Spironolactone previously discontinued.  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  Followed in the CHF clinic.  Holding [place] and [place] due to renal failure.  Remains on furosemide once daily.  Spironolactone was discontinued due to hyperkalemia.   Strict ins and outs and daily weights. Stable for the most part.  Diuresing well.  Diabetes mellitus type 2, uncontrolled with hyperglycemia HbA1c 7.5.  Elevated CBGs due to steroids.  Patient started on Levemir.  CBGs still elevated.  Dose of Levemir being adjusted depending on CBGs.  Constipation with mild hematochezia Small amount of blood was noted mixed in stool this morning.  Patient has been constipated for the last several days.  Could be due to mucosal irritation.  Discussed with nursing staff and patient.  We will continue to  watch for now.  Check hemoglobin tomorrow.  May need further work-up if significant bleeding is noted.  Obesity Estimated body mass index  is 38.04 kg/m as calculated from the following:   Height as of this encounter: 5\' 5"  (1.651 m).   Weight as of this encounter: 103.7 kg.    DVT prophylaxis: On Lovenox.   Code Status: FULL CODE Family Communication: Patient to update Disposition: Hopefully return home when improved.    Status is: Inpatient  Remains inpatient appropriate because:Inpatient level of care appropriate due to severity of illness   Dispo: The patient is from: Home              Anticipated d/c is to: Home              Anticipated d/c date is: 24-48h              Patient currently is not medically stable to d/c.   Consultants:  none  Objective: Blood pressure 127/90, pulse 64, temperature 98.1 F (36.7 C), temperature source Axillary, resp. rate 20, height 5\' 5"  (1.651 m), weight 103.7 kg, SpO2 90 %.  Intake/Output Summary (Last 24 hours) at 08/06/2020 0807 Last data filed at 08/05/2020 1746 Gross per 24 hour  Intake 740 ml  Output --  Net 740 ml   Filed Weights   08/02/20 0500 08/05/20 2311 08/06/20 0344  Weight: 105.8 kg 103.7 kg 103.7 kg     Examination:  General appearance: Awake alert.  In no distress Resp: Diminished bilaterally without overt wheezes rhonchi or rales Cardio: S1-S2 is normal regular.  No S3-S4.  No rubs murmurs or bruit GI: Abdomen is soft.  Nontender nondistended.  Bowel sounds are present normal.  No masses organomegaly Extremities: No edema.  Full range of motion of lower extremities. Neurologic: Alert and oriented x3.  No focal neurological deficits.     CBC: Recent Labs  Lab 07/31/20 0225 08/01/20 0103 08/03/20 0217 08/05/20 0141  WBC 25.5* 25.3* 21.5* 14.9*  NEUTROABS 19.9* 19.1*  --   --   HGB 12.8 12.9 12.5 11.9*  HCT 39.9 40.6 39.4 36.8  MCV 87.5 87.7 88.3 87.8  PLT 365 396 367 324   Basic Metabolic Panel: Recent Labs  Lab 07/31/20 0225 08/01/20 0103 08/02/20 0304 08/03/20 0217 08/04/20 0225 08/05/20 0141 08/06/20 0248  NA 137 137 136    < > 133* 134* 135  K 5.1 5.0 5.5*   < > 5.3* 5.2* 4.9  CL 100 95* 98   < > 96* 94* 96*  CO2 26 31 29    < > 29 32 29  GLUCOSE 276* 221* 241*   < > 242* 207* 318*  BUN 48* 42* 38*   < > 36* 37* 39*  CREATININE 1.59* 1.48* 1.36*   < > 1.14* 1.25* 1.24*  CALCIUM 9.0 9.2 9.0   < > 9.1 9.1 9.0  MG 2.2 2.2 2.2  --   --   --   --    < > = values in this interval not displayed.   GFR: Estimated Creatinine Clearance: 62 mL/min (A) (by C-G formula based on SCr of 1.24 mg/dL (H)).  Liver Function Tests: Recent Labs  Lab 07/31/20 0225 08/01/20 0103  AST 28 34  ALT 49* 48*  ALKPHOS 102 103  BILITOT 0.7 0.7  PROT 6.1* 6.5  ALBUMIN 2.3* 2.5*    HbA1C: Hgb A1c MFr Bld  Date/Time Value Ref Range Status  07/28/2020 03:57 AM 7.5 (H)  4.8 - 5.6 % Final    Comment:    (NOTE) Pre diabetes:          5.7%-6.4%  Diabetes:              >6.4%  Glycemic control for   <7.0% adults with diabetes   12/08/2019 04:35 AM 7.1 (H) 4.8 - 5.6 % Final    Comment:    (NOTE) Pre diabetes:          5.7%-6.4% Diabetes:              >6.4% Glycemic control for   <7.0% adults with diabetes     CBG: Recent Labs  Lab 08/05/20 0743 08/05/20 1154 08/05/20 1704 08/05/20 2027 08/06/20 0749  GLUCAP 185* 178* 228* 247* 195*    Recent Results (from the past 240 hour(s))  MRSA PCR Screening     Status: None   Collection Time: 07/28/20  4:47 PM   Specimen: Nasal Mucosa; Nasopharyngeal  Result Value Ref Range Status   MRSA by PCR NEGATIVE NEGATIVE Final    Comment:        The GeneXpert MRSA Assay (FDA approved for NASAL specimens only), is one component of a comprehensive MRSA colonization surveillance program. It is not intended to diagnose MRSA infection nor to guide or monitor treatment for MRSA infections. Performed at [facility], [address], [place], [place] [address]      Scheduled Meds: . aspirin EC  81 mg Oral Daily  . carvedilol  6.25 mg Oral BID WC  . docusate sodium   100 mg Oral BID  . enoxaparin (LOVENOX) injection  0.5 mg/kg Subcutaneous Q24H  . famotidine  20 mg Oral Daily  . feeding supplement (GLUCERNA SHAKE)  237 mL Oral TID BM  . furosemide  60 mg Intravenous Daily  . insulin aspart  0-20 Units Subcutaneous TID WC  . insulin aspart  0-5 Units Subcutaneous QHS  . insulin aspart  8 Units Subcutaneous TID WC  . insulin detemir  24 Units Subcutaneous BID  . mouth rinse  15 mL Mouth Rinse BID  . methylPREDNISolone (SOLU-MEDROL) injection  40 mg Intravenous Q12H  . mometasone-formoterol  2 puff Inhalation BID  . polyethylene glycol  17 g Oral Daily  . senna  1 tablet Oral QHS   Continuous Infusions: . sodium chloride 75 mL/hr at 07/29/20 0600     LOS: 10 days   Azucena Fallen DO  [facility] Pager - Secure chat/Epic messenger  If 7PM-7AM, please contact night-coverage per Amion 08/06/2020, 8:07 AM

## 2020-08-06 NOTE — Progress Notes (Signed)
Sent chat message to Dr. Natale Milch.  Pt. Is inquiring about whether or not she will d/c today.

## 2020-08-06 NOTE — Plan of Care (Signed)
VSS. Patient remains on 5L [place]. Walked in the halls last evening. Ambulating to BR. During ambulation oxygen increased to 6L. No complaints overnight. Call bell within reach.  Problem: Education: Goal: Knowledge of risk factors and measures for prevention of condition will improve Outcome: Progressing   Problem: Coping: Goal: Psychosocial and spiritual needs will be supported Outcome: Progressing   Problem: Respiratory: Goal: Will maintain a patent airway Outcome: Progressing Goal: Complications related to the disease process, condition or treatment will be avoided or minimized Outcome: Progressing

## 2020-08-07 DIAGNOSIS — R739 Hyperglycemia, unspecified: Secondary | ICD-10-CM | POA: Insufficient documentation

## 2020-08-07 LAB — GLUCOSE, CAPILLARY
Glucose-Capillary: 89 mg/dL (ref 70–99)
Glucose-Capillary: 150 mg/dL — ABNORMAL HIGH (ref 70–99)
Glucose-Capillary: 105 mg/dL — ABNORMAL HIGH (ref 70–99)
Glucose-Capillary: 210 mg/dL — ABNORMAL HIGH (ref 70–99)

## 2020-08-07 NOTE — TOC Transition Note (Signed)
Transition of Care [facility]) - CM/SW Discharge Note   Patient Details  Name: Maria Duncan MRN: [id number] Date of Birth: 10-Jan-1966  [facility] CM/SW Contact:  Lockie Pares, RN Phone Number: 08/07/2020, 4:47 PM   Clinical Narrative:     Spoke to patient, she requested a 3:1 recommended by PT She states he husband is in the hospital still , she has oxygen through her CPAP at home . Ordered 4LPM and 3:1 from [employer]  No other needs, no medications ordered post hospitalizatio  Final next level of care: Home/Self Care Barriers to Discharge: No Barriers Identified   Patient Goals and CMS Choice Patient states their goals for this hospitalization and ongoing recovery are:: home husband also has COVID he is still in hospital      Discharge Placement                       Discharge Plan and Services   Discharge Planning Services: CM Consult              DME Agency: [employer] Date DME Agency Contacted: 08/07/20 Time DME Agency Contacted: [phone] Representative spoke with at DME Agency: Duwayne Heck            Social Determinants of Health (SDOH) Interventions     Readmission Risk Interventions No flowsheet data found.

## 2020-08-07 NOTE — Discharge Summary (Signed)
Physician Discharge Summary  Maria Duncan XNA:[id number] DOB: 1965/12/20 DOA: 07/27/2020  PCP: Oneita Hurt, No  Admit date: 07/27/2020 Discharge date: 08/07/2020  Admitted From: Home Disposition: Home  Recommendations for Outpatient Follow-up:  1. Follow up with PCP in 1-2 weeks 2. Please obtain BMP/CBC in one week 3. Please follow up on the following pending results:  Home Health: None Equipment/Devices: Oxygen  Discharge Condition: Stable CODE STATUS: Full Diet recommendation: Low-salt low-fat diet   Brief/Interim Summary: 55 year old with a history of HTN, obesity, pulmonary hypertension, OSA/OHS on CPAP, and combined systolic/diastolic CHF who presented to the ED with worsening shortness of breath.  She was diagnosed with Covid 12/28.  Since that time she has had progressively worsening shortness of breath, and some intermittent diarrhea.  In the ED she was found to be hypoxic and required 10 L oxygen support to keep saturations at 90% or better.  CXR noted extensive bilateral pulmonary infiltrates.  Patient admitted as above with acute hypoxic respiratory failure in setting of COVID-19 pneumonia.  Patient completed Remdesivir, Actemra, ceftriaxone and azithromycin given concern for concurrent bacterial infection.  Patient's symptoms improved slowly over the past few days, now able to ambulate on 4 L nasal cannula without overt symptoms or hypoxia.  Given resolution of COVID-19 pneumonia patient will be discharged home with ongoing supportive care, but has completed her medication regimen.  Husband is also been admitted and hospitalized for COVID-19 pneumonia and hypoxia.  We discussed ongoing need for quarantine however patient and husband, given they are both acutely ill with COVID-19 do not need to quarantine from each other.  At this time patient is otherwise stable and agreeable for discharge home, close follow-up with PCP in the next 1 to 2 weeks as scheduled.  Of note patient also had  moderately elevated LFTs, elevated creatinine in the setting of acute viral illness that resolved prior to discharge.  No medication changes from home meds as outlined below other than discontinuation of spironolactone.  Discharge Diagnoses:  Principal Problem:   Acute hypoxemic respiratory failure due to COVID-19 [facility]) Active Problems:   Essential hypertension   NICM (nonischemic cardiomyopathy) (HCC)   Morbid obesity (HCC)   Pulmonary hypertension, unspecified (HCC)   OSA (obstructive sleep apnea)   Hypoventilation associated with obesity (HCC)   CAD in native artery non obstructive on cath 12/09/19   Combined congestive systolic and diastolic heart failure (HCC)   Pneumonia due to COVID-19 virus   Hyperglycemia    Discharge Instructions  Discharge Instructions    Call MD for:  difficulty breathing, headache or visual disturbances   Complete by: As directed    Diet Carb Modified   Complete by: As directed    Increase activity slowly   Complete by: As directed      Allergies as of 08/07/2020      Reactions   Hyzaar [losartan Potassium-hctz] Nausea And Vomiting, Cough   Patient can take Entresto now      Medication List    STOP taking these medications   spironolactone 25 MG tablet Commonly known as: ALDACTONE     TAKE these medications   acetaminophen 325 MG tablet Commonly known as: TYLENOL Take 2 tablets (650 mg total) by mouth every 4 (four) hours as needed for headache or mild pain.   aspirin 81 MG EC tablet Take 1 tablet (81 mg total) by mouth daily.   carvedilol 6.25 MG tablet Commonly known as: COREG Take 1 tablet (6.25 mg total) by mouth 2 (two) times  daily.   cholecalciferol 25 MCG (1000 UNIT) tablet Commonly known as: VITAMIN D3 Take 1,000 Units by mouth daily.   dapagliflozin propanediol 10 MG Tabs tablet Commonly known as: Farxiga Take 1 tablet (10 mg total) by mouth daily before breakfast.   docusate sodium 100 MG capsule Commonly known as:  COLACE Take 1 capsule (100 mg total) by mouth 2 (two) times daily.   pantoprazole 40 MG tablet Commonly known as: PROTONIX TAKE 1 TABLET (40 MG TOTAL) BY MOUTH DAILY. What changed:   when to take this  reasons to take this   polyethylene glycol 17 g packet Commonly known as: MIRALAX / GLYCOLAX Take 17 g by mouth daily as needed (constipation). What changed: reasons to take this   sacubitril-valsartan 49-51 MG Commonly known as: ENTRESTO Take 1 tablet by mouth 2 (two) times daily.   torsemide 20 MG tablet Commonly known as: DEMADEX TAKE 1 TABLET (20 MG TOTAL) BY MOUTH 2 (TWO) TIMES DAILY. What changed: See the new instructions.   TRUEplus Lancets 28G Misc 1 each by Does not apply route in the morning and at bedtime.            Durable Medical Equipment  (From admission, onward)         Start     Ordered   08/07/20 1645  For home use only DME 3 n 1  Once        08/07/20 1644   08/07/20 1428  DME Oxygen  Once       Question Answer Comment  Length of Need 6 Months   Mode or (Route) Nasal cannula   Liters per Minute 4   Frequency Continuous (stationary and portable oxygen unit needed)   Oxygen delivery system Gas      08/07/20 1429          Follow-up Information    [facility] Follow up.   Contact information: [address] [place] [address] [phone]             Allergies  Allergen Reactions  . Hyzaar [Losartan Potassium-Hctz] Nausea And Vomiting and Cough    Patient can take Entresto now    Consultations: None  Procedures/Studies: DG Chest Port 1 View  Result Date: 08/03/2020 CLINICAL DATA:  Pneumonia EXAM: PORTABLE CHEST 1 VIEW COMPARISON:  Three days ago FINDINGS: Low volume chest with patchy bilateral pneumonia. The pattern and density is not convincingly changed from before when allowing for differences in technique. Cardiomegaly. No visible effusion or pneumothorax. IMPRESSION: Similar  degree of bilateral pneumonia. Electronically Signed   By: Marnee Spring M.D.   On: 08/03/2020 05:49   DG Chest Port 1 View  Result Date: 07/31/2020 CLINICAL DATA:  COVID pneumonia EXAM: PORTABLE CHEST 1 VIEW COMPARISON:  07/28/2020 FINDINGS: Lung volumes are extremely small and pulmonary insufflation has diminished since prior examination. Superimposed extensive mid and lower lung zone predominant pulmonary infiltrate has progressed in the interval since prior examination. No pneumothorax or pleural effusion. Cardiac size within normal limits. IMPRESSION: Progressive multifocal pulmonary infiltrate. Progressive pulmonary hypoinflation. Electronically Signed   By: Helyn Numbers MD   On: 07/31/2020 06:10   DG Chest Port 1 View  Result Date: 07/28/2020 CLINICAL DATA:  Pneumonia.  COVID. EXAM: PORTABLE CHEST 1 VIEW COMPARISON:  07/27/2020. FINDINGS: Cardiomegaly. Pulmonary vascularity/hilar structures less well visualized on today's exam. Low lung volumes. Diffuse bilateral pulmonary infiltrates/edema again noted with slight progression from prior exam. No prominent pleural effusion. No  pneumothorax. IMPRESSION: 1. Cardiomegaly. 2. Low lung volumes. Diffuse bilateral pulmonary infiltrates/edema again noted with slight progression from prior exam. Electronically Signed   By: Maisie Fus  Register   On: 07/28/2020 06:59   DG Chest Port 1 View  Result Date: 07/27/2020 CLINICAL DATA:  Hypoxic EXAM: PORTABLE CHEST 1 VIEW COMPARISON:  07/23/2020 FINDINGS: Lung volumes are small and pulmonary insufflation has slightly diminished since prior examination. There has developed extensive bilateral slightly asymmetric mid and lower lung zone pulmonary infiltrates, likely infectious or inflammatory. Bilateral hilar enlargement may represent hilar adenopathy or central pulmonary arterial enlargement. Cardiac size is mildly enlarged, unchanged. No acute bone abnormality. IMPRESSION: Interval development of extensive mid and  lower lung zone pulmonary infiltrate, likely infectious in the acute setting. Bilateral hilar enlargement representing either hilar adenopathy versus pulmonary vascular enlargement. In Electronically Signed   By: Helyn Numbers MD   On: 07/27/2020 02:11   DG Chest Portable 1 View  Result Date: 07/23/2020 CLINICAL DATA:  Fever and body aches.  Reported COVID-19 positive EXAM: PORTABLE CHEST 1 VIEW COMPARISON:  Dec 07, 2019 chest radiograph and chest CT FINDINGS: There is mild atelectatic change in the right mid lung and bibasilar regions. Lungs otherwise are clear. Heart is mildly enlarged with pulmonary vascularity normal. No adenopathy. No bone lesions. IMPRESSION: Areas of mild atelectatic change bilaterally. Earliest changes of atypical organism pneumonia could present in this manner. No well-defined airspace opacity, however. Stable cardiac prominence. No adenopathy. Electronically Signed   By: Bretta Bang III M.D.   On: 07/23/2020 12:28   VAS [place] LOWER EXTREMITY VENOUS (DVT)  Result Date: 07/27/2020  Lower Venous DVT Study Indications: Elevated d-dimer, Covid.  Anticoagulation: Lovenox. Comparison Study: 12-07-2019 Prior bilateral lower extremity venous. Performing Technologist: Jean Rosenthal RDMS  Examination Guidelines: A complete evaluation includes B-mode imaging, spectral Doppler, color Doppler, and power Doppler as needed of all accessible portions of each vessel. Bilateral testing is considered an integral part of a complete examination. Limited examinations for reoccurring indications may be performed as noted. The reflux portion of the exam is performed with the patient in reverse Trendelenburg.  +---------+---------------+---------+-----------+----------+--------------+ RIGHT    CompressibilityPhasicitySpontaneityPropertiesThrombus Aging +---------+---------------+---------+-----------+----------+--------------+ CFV      Full           Yes      Yes                                  +---------+---------------+---------+-----------+----------+--------------+ SFJ      Full                                                        +---------+---------------+---------+-----------+----------+--------------+ FV Prox  Full                                                        +---------+---------------+---------+-----------+----------+--------------+ FV Mid   Full                                                        +---------+---------------+---------+-----------+----------+--------------+  FV DistalFull                                                        +---------+---------------+---------+-----------+----------+--------------+ PFV      Full                                                        +---------+---------------+---------+-----------+----------+--------------+ POP      Full           Yes      Yes                                 +---------+---------------+---------+-----------+----------+--------------+ PTV      Full                                                        +---------+---------------+---------+-----------+----------+--------------+ PERO     Full                                                        +---------+---------------+---------+-----------+----------+--------------+   +---------+---------------+---------+-----------+----------+--------------+ LEFT     CompressibilityPhasicitySpontaneityPropertiesThrombus Aging +---------+---------------+---------+-----------+----------+--------------+ CFV      Full           Yes      Yes                                 +---------+---------------+---------+-----------+----------+--------------+ SFJ      Full                                                        +---------+---------------+---------+-----------+----------+--------------+ FV Prox  Full                                                         +---------+---------------+---------+-----------+----------+--------------+ FV Mid   Full                                                        +---------+---------------+---------+-----------+----------+--------------+ FV DistalFull                                                        +---------+---------------+---------+-----------+----------+--------------+   PFV      Full                                                        +---------+---------------+---------+-----------+----------+--------------+ POP      Full           Yes      Yes                                 +---------+---------------+---------+-----------+----------+--------------+ PTV      Full                                                        +---------+---------------+---------+-----------+----------+--------------+ PERO     Full                                                        +---------+---------------+---------+-----------+----------+--------------+     Summary: RIGHT: - There is no evidence of deep vein thrombosis in the lower extremity.  - No cystic structure found in the popliteal fossa.  LEFT: - There is no evidence of deep vein thrombosis in the lower extremity.  - No cystic structure found in the popliteal fossa.  *See table(s) above for measurements and observations. Electronically signed by Fabienne Bruns MD on 07/27/2020 at 12:09:58 PM.    Final      Subjective: No acute issues or events overnight   Discharge Exam: Vitals:   08/07/20 1208 08/07/20 1642  BP: 94/65 111/79  Pulse: 69 80  Resp: 18 18  Temp: 98 F (36.7 C) 98.1 F (36.7 C)  SpO2: 96% 93%   Vitals:   08/07/20 0516 08/07/20 0733 08/07/20 1208 08/07/20 1642  BP: 130/75 110/81 94/65 111/79  Pulse: 70 89 69 80  Resp: 17 18 18 18   Temp: 98 F (36.7 C) 98.7 F (37.1 C) 98 F (36.7 C) 98.1 F (36.7 C)  TempSrc: Oral Axillary Axillary Axillary  SpO2: 97% 93% 96% 93%  Weight: 104 kg     Height:         General: Pt is alert, awake, not in acute distress Cardiovascular: RRR, S1/S2 +, no rubs, no gallops Respiratory: CTA bilaterally, no wheezing, no rhonchi Abdominal: Soft, NT, ND, bowel sounds + Extremities: no edema, no cyanosis    The results of significant diagnostics from this hospitalization (including imaging, microbiology, ancillary and laboratory) are listed below for reference.     Microbiology: No results found for this or any previous visit (from the past 240 hour(s)).   Labs: BNP (last 3 results) Recent Labs    01/14/20 1226 03/11/20 1216 07/27/20 0150  BNP 60.6 67.9 420.3*   Basic Metabolic Panel: Recent Labs  Lab 08/01/20 0103 08/02/20 0304 08/03/20 0217 08/04/20 0225 08/05/20 0141 08/06/20 0248  NA 137 136 133* 133* 134* 135  K 5.0 5.5* 5.2* 5.3* 5.2* 4.9  CL 95* 98 97* 96* 94* 96*  CO2 31 29 27  29  32 29  GLUCOSE 221* 241* 257* 242* 207* 318*  BUN 42* 38* 39* 36* 37* 39*  CREATININE 1.48* 1.36* 1.22* 1.14* 1.25* 1.24*  CALCIUM 9.2 9.0 8.9 9.1 9.1 9.0  MG 2.2 2.2  --   --   --   --    Liver Function Tests: Recent Labs  Lab 08/01/20 0103  AST 34  ALT 48*  ALKPHOS 103  BILITOT 0.7  PROT 6.5  ALBUMIN 2.5*   No results for input(s): LIPASE, AMYLASE in the last 168 hours. No results for input(s): AMMONIA in the last 168 hours. CBC: Recent Labs  Lab 08/01/20 0103 08/03/20 0217 08/05/20 0141  WBC 25.3* 21.5* 14.9*  NEUTROABS 19.1*  --   --   HGB 12.9 12.5 11.9*  HCT 40.6 39.4 36.8  MCV 87.7 88.3 87.8  PLT 396 367 324   Cardiac Enzymes: No results for input(s): CKTOTAL, CKMB, CKMBINDEX, TROPONINI in the last 168 hours. BNP: Invalid input(s): POCBNP CBG: Recent Labs  Lab 08/06/20 1700 08/06/20 2056 08/07/20 0732 08/07/20 1206 08/07/20 1637  GLUCAP 254* 300* 89 150* 105*   D-Dimer No results for input(s): DDIMER in the last 72 hours. Hgb A1c No results for input(s): HGBA1C in the last 72 hours. Lipid Profile No results  for input(s): CHOL, HDL, LDLCALC, TRIG, CHOLHDL, LDLDIRECT in the last 72 hours. Thyroid function studies No results for input(s): TSH, T4TOTAL, T3FREE, THYROIDAB in the last 72 hours.  Invalid input(s): FREET3 Anemia work up No results for input(s): VITAMINB12, FOLATE, FERRITIN, TIBC, IRON, RETICCTPCT in the last 72 hours. Urinalysis    Component Value Date/Time   COLORURINE COLORLESS (A) 12/07/2019 2108   APPEARANCEUR CLEAR 12/07/2019 2108   LABSPEC 1.006 12/07/2019 2108   PHURINE 7.0 12/07/2019 2108   GLUCOSEU NEGATIVE 12/07/2019 2108   HGBUR NEGATIVE 12/07/2019 2108   BILIRUBINUR NEGATIVE 12/07/2019 2108   BILIRUBINUR neg 03/02/2014 1606   KETONESUR NEGATIVE 12/07/2019 2108   PROTEINUR NEGATIVE 12/07/2019 2108   UROBILINOGEN 1.0 03/02/2014 1606   UROBILINOGEN 0.2 03/17/2013 1450   NITRITE NEGATIVE 12/07/2019 2108   LEUKOCYTESUR NEGATIVE 12/07/2019 2108   Sepsis Labs Invalid input(s): PROCALCITONIN,  WBC,  LACTICIDVEN Microbiology No results found for this or any previous visit (from the past 240 hour(s)).   Time coordinating discharge: Over 30 minutes  SIGNED:   Azucena FallenWilliam C Hanni Milford, DO [facility] 08/07/2020, 5:33 PM Pager   If 7PM-7AM, please contact night-coverage [email or web address]

## 2020-08-07 NOTE — Progress Notes (Signed)
SATURATION QUALIFICATIONS: (This note is used to comply with regulatory documentation for home oxygen)  Patient Saturations on Room Air at Rest = 80%  Patient Saturations on Room Air while Ambulating = n/a unable%  Patient Saturations on 4 Liters of oxygen while Ambulating = 89%  Please briefly explain why patient needs home oxygen: Pt requires 4L O2 via [place] to maintain SpO2 at or above 89% - with standing rest breaks and PLB she is able to get SpO2 above 90% within 30 seconds. Without supplemental O2 Pt desaturates below a safe level.  Nyoka Cowden OTR/L Acute Rehabilitation Services Pager: [phone] Office: [phone]

## 2020-08-07 NOTE — Progress Notes (Signed)
Occupational Therapy Treatment Patient Details Name: Maria Duncan MRN: [id number] DOB: 1965-10-20 Today's Date: 08/07/2020    History of present illness Pt is a 55 y.o. female dx with COVID-19 on 07/21/20, now admitted 07/27/20 with worsening SOB. Workup for ARF, transaminitis secondary to COVID-19. PMH includes pulmonary HTN, OSA (on CPAP), cardiomyopathy, obesity, HTN, DM, CHF.   OT comments  Pt making excellent progress towards OT goals today. Pt able to perform toilet transfer, pero care, UB and LB dressing, and hallway ambulation at supervision level. Pt on 4L O2 throughout and able to maintain SpO2 >89% even with activity. Pt with good demonstration of PLB and monitoring her own body to stop for standing rest breaks during hallway ambulation. Please see separate saturation qualification note. Pt also educated on energy conservation throughout session with special focus on ADL. 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    Follow Up Recommendations  No OT follow up;Supervision - Intermittent    Equipment Recommendations  Tub/shower bench    Recommendations for Other Services      Precautions / Restrictions Precautions Precautions: Other (comment) Precaution Comments: monitor sats       Mobility Bed Mobility               General bed mobility comments: OOB in recliner at beginning and end of session  Transfers Overall transfer level: Independent Equipment used: None                  Balance Overall balance assessment: No apparent balance deficits (not formally assessed)                                         ADL either performed or assessed with clinical judgement   ADL Overall ADL's : Needs assistance/impaired     Grooming: Wash/dry hands;Oral care;Wash/dry face;Supervision/safety;Standing Grooming Details (indicate cue type and reason): educated on energy conservation during ADL         Upper Body Dressing :  Supervision/safety;Sitting Upper Body Dressing Details (indicate cue type and reason): to don extra hospital gown like bath robe Lower Body Dressing: Supervision/safety;Sitting/lateral leans Lower Body Dressing Details (indicate cue type and reason): to don socks Toilet Transfer: Supervision/safety;Ambulation Toilet Transfer Details (indicate cue type and reason): managed own O2 tank Toileting- Clothing Manipulation and Hygiene: Supervision/safety;Sit to/from stand Toileting - Clothing Manipulation Details (indicate cue type and reason): front and back peri care without assist     Functional mobility during ADLs: Supervision/safety       Vision       Perception     Praxis      Cognition Arousal/Alertness: Awake/alert Behavior During Therapy: WFL for tasks assessed/performed Overall Cognitive Status: Within Functional Limits for tasks assessed                                          Exercises     Shoulder Instructions       General Comments see SpO2 note    Pertinent Vitals/ Pain       Pain Assessment: No/denies pain  Home Living  Prior Functioning/Environment              Frequency  Min 2X/week        Progress Toward Goals  OT Goals(current goals can now be found in the care plan section)  Progress towards OT goals: Progressing toward goals  Acute Rehab OT Goals Patient Stated Goal: to go back to work OT Goal Formulation: With patient Time For Goal Achievement: 08/16/20 Potential to Achieve Goals: Good  Plan Discharge plan remains appropriate    Co-evaluation                 AM-PAC OT "6 Clicks" Daily Activity     Outcome Measure   Help from another person eating meals?: None Help from another person taking care of personal grooming?: A Little Help from another person toileting, which includes using toliet, bedpan, or urinal?: A Little Help from another  person bathing (including washing, rinsing, drying)?: A Little Help from another person to put on and taking off regular upper body clothing?: None Help from another person to put on and taking off regular lower body clothing?: A Little 6 Click Score: 20    End of Session Equipment Utilized During Treatment: Oxygen (4L O2 via [place])  OT Visit Diagnosis: Unsteadiness on feet (R26.81)   Activity Tolerance Patient tolerated treatment well   Patient Left in chair;with call bell/phone within reach   Nurse Communication Mobility status        Time: 2563-8937 OT Time Calculation (min): 23 min  Charges: OT General Charges $OT Visit: 1 Visit OT Treatments $Self Care/Home Management : 8-22 mins $Therapeutic Activity: 8-22 mins  Nyoka Cowden OTR/L Acute Rehabilitation Services Pager: [phone] Office: [phone]   Evern Bio Kalei Mckillop 08/07/2020, 2:27 PM

## 2020-08-07 NOTE — Plan of Care (Signed)
VSS. Remains on 4L. OOB to BR on 6L. Call bell within reach.   Problem: Education: Goal: Knowledge of risk factors and measures for prevention of condition will improve Outcome: Progressing   Problem: Coping: Goal: Psychosocial and spiritual needs will be supported Outcome: Progressing   Problem: Respiratory: Goal: Will maintain a patent airway Outcome: Progressing Goal: Complications related to the disease process, condition or treatment will be avoided or minimized Outcome: Progressing

## 2020-08-07 NOTE — Progress Notes (Signed)
Discharge instructions reviewed with patient, all questions answered. Belongings in belonging bags. Home oxygen delivered.  Patient out via wheelchair with belongings and home oxygen tank.

## 2020-08-12 ENCOUNTER — Telehealth: Payer: Self-pay

## 2020-08-12 NOTE — Telephone Encounter (Signed)
Pt called requesting a appt for a Hospital F/U  dicharged 08/07/20. Appt was given for 08/25/20@ 2:45  With DR Imogene Burn

## 2020-08-13 ENCOUNTER — Ambulatory Visit: Payer: Self-pay | Admitting: Cardiology

## 2020-08-14 ENCOUNTER — Encounter: Payer: Self-pay | Admitting: Cardiology

## 2020-08-14 ENCOUNTER — Telehealth: Payer: Self-pay | Admitting: *Deleted

## 2020-08-14 ENCOUNTER — Other Ambulatory Visit: Payer: Self-pay

## 2020-08-14 ENCOUNTER — Ambulatory Visit (INDEPENDENT_AMBULATORY_CARE_PROVIDER_SITE_OTHER): Payer: Self-pay | Admitting: Cardiology

## 2020-08-14 VITALS — BP 108/66 | HR 86 | Ht 65.0 in | Wt 228.0 lb

## 2020-08-14 DIAGNOSIS — G4733 Obstructive sleep apnea (adult) (pediatric): Secondary | ICD-10-CM

## 2020-08-14 DIAGNOSIS — I10 Essential (primary) hypertension: Secondary | ICD-10-CM

## 2020-08-14 NOTE — Patient Instructions (Signed)
Medication Instructions:  Your physician recommends that you continue on your current medications as directed. Please refer to the Current Medication list given to you today.  *If you need a refill on your cardiac medications before your next appointment, please call your pharmacy*   Lab Work: NONE  Testing/Procedures: NONE   Follow-Up: At [facility], you and your health needs are our priority.  As part of our continuing mission to provide you with exceptional heart care, we have created designated Provider Care Teams.  These Care Teams include your primary Cardiologist (physician) and Advanced Practice Providers (APPs -  Physician Assistants and Nurse Practitioners) who all work together to provide you with the care you need, when you need it.  We recommend signing up for the patient portal called "MyChart".  Sign up information is provided on this After Visit Summary.  MyChart is used to connect with patients for Virtual Visits (Telemedicine).  Patients are able to view lab/test results, encounter notes, upcoming appointments, etc.  Non-urgent messages can be sent to your provider as well.   To learn more about what you can do with MyChart, go to [email or web address].com.    Your next appointment:   1 year(s)  The format for your next appointment:   In Person  Provider:   You may see Traci Turner, MD or one of the following Advanced Practice Providers on your designated Care Team:    Dayna Dunn, PA-C  Michele Lenze, PA-C      

## 2020-08-14 NOTE — Telephone Encounter (Signed)
-----   Message from Quintella Reichert, MD sent at 08/14/2020  3:23 PM EST ----- Please call DME - her PAP device is not working and she also needs her O2 bled into her PAP.  They need to come to her house to work this out and decrease CPAP to 15cm H2O. I need her to get a download in 4 weeks.

## 2020-08-14 NOTE — Telephone Encounter (Signed)
Order placed to [facility] for decrease CPAP to 15cm H2O. I need her to get a download in 4 weeks.   she also needs her O2 bled into her PAP.

## 2020-08-14 NOTE — Progress Notes (Signed)
Date:  08/14/2020   ID:  Maria Duncan, DOB May 24, 1966, MRN [id number] The patient was identified using 2 identifiers.  PCP:  Pcp, No  Cardiologist:  Kristeen Miss, MD  Electrophysiologist:  None   Evaluation Performed:  Follow-Up Visit  Chief Complaint:  OSA  History of Present Illness:    Maria Duncan is a 55 y.o. female with a hx of chronic combined systolic/diastolic CHF, DM, HTN, morbid Obesity, NICM, pulmonary HTN who was referred by Nada Boozer, NP for evaluation of possible OSA.  She complained of excessive daytime sleepiness with an ESS of 9, sleep walking and sleep talking and witnessed apneas during sleep.  Her daughter and husband have remarked that she snores loudly and stops breathing in her sleep.  She underwent split night sleep study which showed severe OSA with an AHI of 99.1/hr and minimal CSA with AHI 1/hr as well as severe nocturnal hypoxemia with a nadir O2 of 51%.  He underwent CPAP titration to 17cm H2O but unfortunately she felt the pressure was too high and wanted it lowered and an order was placed to lower to 15cm H2O but this was never done.   Unfortunately she had COVID 19 in Dec and had a complicated hospital course and was just discharged a few days ago.  She is now on 3L O2 and followed by Pulmonary.   She is doing well with her CPAP device and thinks that she has gotten used to it.  She tolerates the mask but says that her device is not working.     Past Medical History:  Diagnosis Date  . 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 Chest pain of uncertain etiology, non obstructive CAD, pain due to acute HF 12/12/2019  . CHF (congestive heart failure) (HCC)   . Diabetes mellitus without complication (HCC)   . Hypertension   . Hypoventilation associated with obesity (HCC) 12/12/2019  . Morbid obesity (HCC)   . NICM (nonischemic cardiomyopathy) (HCC)   . OSA (obstructive sleep apnea) 12/12/2019  . Pulmonary hypertension, unspecified  (HCC) 12/12/2019   Past Surgical History:  Procedure Laterality Date  . ABDOMINAL HYSTERECTOMY    . RIGHT/LEFT HEART CATH AND CORONARY ANGIOGRAPHY N/A 12/09/2019   Procedure: RIGHT/LEFT HEART CATH AND CORONARY ANGIOGRAPHY;  Surgeon: Dolores Patty, MD;  Location: [facility];  Service: Cardiovascular;  Laterality: N/A;     Current Meds  Medication Sig  . acetaminophen (TYLENOL) 325 MG tablet Take 2 tablets (650 mg total) by mouth every 4 (four) hours as needed for headache or mild pain.  Marland Kitchen aspirin 81 MG EC tablet Take 1 tablet (81 mg total) by mouth daily.  . carvedilol (COREG) 6.25 MG tablet Take 1 tablet (6.25 mg total) by mouth 2 (two) times daily.  . cholecalciferol (VITAMIN D3) 25 MCG (1000 UNIT) tablet Take 1,000 Units by mouth daily.  . dapagliflozin propanediol (FARXIGA) 10 MG TABS tablet Take 1 tablet (10 mg total) by mouth daily before breakfast.  . docusate sodium (COLACE) 100 MG capsule Take 1 capsule (100 mg total) by mouth 2 (two) times daily.  . pantoprazole (PROTONIX) 40 MG tablet TAKE 1 TABLET (40 MG TOTAL) BY MOUTH DAILY. (Patient taking differently: Take 40 mg by mouth daily as needed (stomach).)  . polyethylene glycol (MIRALAX / GLYCOLAX) 17 g packet Take 17 g by mouth daily as needed (constipation). (Patient taking differently: Take 17 g by mouth daily as needed for mild constipation (constipation).)  .  sacubitril-valsartan (ENTRESTO) 49-51 MG Take 1 tablet by mouth 2 (two) times daily.  Marland Kitchen torsemide (DEMADEX) 20 MG tablet TAKE 1 TABLET (20 MG TOTAL) BY MOUTH 2 (TWO) TIMES DAILY. (Patient taking differently: Take 20 mg by mouth 2 (two) times daily.)  . TRUEplus Lancets 28G MISC 1 each by Does not apply route in the morning and at bedtime.     Allergies:   Hyzaar [losartan potassium-hctz]   Social History   Tobacco Use  . Smoking status: Never Smoker  . Smokeless tobacco: Never Used  Substance Use Topics  . Alcohol use: No  . Drug use: No     Family  Hx: The patient's family history includes Cancer in her maternal grandfather, maternal grandmother, mother, and paternal grandfather; Diabetes in her father, maternal grandfather, and paternal grandmother; Heart disease in her maternal grandfather; Hyperlipidemia in her brother, mother, and sister; Hypertension in her brother, father, maternal grandfather, paternal grandfather, paternal grandmother, and sister; Stroke in her maternal grandmother and paternal grandfather.  ROS:   Please see the history of present illness.     All other systems reviewed and are negative.   Prior CV studies:   The following studies were reviewed today:  CPAP download  Labs/Other Tests and Data Reviewed:    EKG:  No ECG reviewed.  Recent Labs: 12/08/2019: TSH 2.987 12/19/2019: NT-Pro BNP 957 07/27/2020: B Natriuretic Peptide 420.3 08/01/2020: ALT 48 08/02/2020: Magnesium 2.2 08/05/2020: Hemoglobin 11.9; Platelets 324 08/06/2020: BUN 39; Creatinine, Ser 1.24; Potassium 4.9; Sodium 135   Recent Lipid Panel Lab Results  Component Value Date/Time   CHOL 204 (H) 12/08/2019 04:35 AM   TRIG 144 07/27/2020 01:50 AM   HDL 65 12/08/2019 04:35 AM   CHOLHDL 3.1 12/08/2019 04:35 AM   LDLCALC 123 (H) 12/08/2019 04:35 AM    Wt Readings from Last 3 Encounters:  08/14/20 228 lb (103.4 kg)  08/07/20 229 lb 4.5 oz (104 kg)  07/23/20 238 lb (108 kg)     Risk Assessment/Calculations:      Objective:    Vital Signs:  BP 108/66   Pulse 86   Ht 5\' 5"  (1.651 m)   Wt 228 lb (103.4 kg)   SpO2 92%   BMI 37.94 kg/m    GEN: Well nourished, well developed in no acute distress HEENT: Normal NECK: No JVD; No carotid bruits LYMPHATICS: No lymphadenopathy CARDIAC:RRR, no murmurs, rubs, gallops RESPIRATORY:  Clear to auscultation without rales, wheezing or rhonchi  ABDOMEN: Soft, non-tender, non-distended MUSCULOSKELETAL:  No edema; No deformity  SKIN: Warm and dry NEUROLOGIC:  Alert and oriented x 3 PSYCHIATRIC:   Normal affect    ASSESSMENT & PLAN:    1.  OSA - The patient is tolerating PAP therapy well without any problems. The PAP download was reviewed today and showed an AHI of 2.7/hr on 17 cm H2O with 17% compliance in using more than 4 hours nightly.  The patient has been using and benefiting from PAP use and will continue to benefit from therapy.  -unfortunately she was never decreased to 15cm H2O -she had COVID 19 in December and could not use her device for 3 weeks and now the DME is calling her to tell her if she was not compliant she would have to turn her device back in -I will decrease her pressure to 15cm H2O and get a download in 2 weeks -I will contact her DME to tell them that her device is not turning on  2.  HTN -  BP is well controlled on exam today -continue Carvedilol 6.25mg  BID, spiro 20mg  daily and Entresto 49-51mg  BID  3.  Morbid Obesity -I have encouraged him to get into a routine exercise program and cut back on carbs and portions.    Medication Adjustments/Labs and Tests Ordered: Current medicines are reviewed at length with the patient today.  Concerns regarding medicines are outlined above.   Tests Ordered: No orders of the defined types were placed in this encounter.   Medication Changes: No orders of the defined types were placed in this encounter.   Follow Up:  8 weeks   Signed, , MD  08/14/2020 3:17 PM    [facility]

## 2020-08-18 ENCOUNTER — Telehealth (INDEPENDENT_AMBULATORY_CARE_PROVIDER_SITE_OTHER): Payer: HRSA Program | Admitting: Nurse Practitioner

## 2020-08-18 DIAGNOSIS — J9601 Acute respiratory failure with hypoxia: Secondary | ICD-10-CM | POA: Diagnosis not present

## 2020-08-18 DIAGNOSIS — J1282 Pneumonia due to coronavirus disease 2019: Secondary | ICD-10-CM | POA: Diagnosis not present

## 2020-08-18 DIAGNOSIS — U071 COVID-19: Secondary | ICD-10-CM | POA: Diagnosis not present

## 2020-08-18 NOTE — Patient Instructions (Signed)
Covid pneumonia Acute hypoxic respiratory failure:   Stay well hydrated  Stay active  Deep breathing exercises  May take tylenol for fever or pain  May take mucinex twice daily    Follow up:  Follow up in 1 week or sooner if needed - will need lab work - will need follow up imaging in 2 weeks

## 2020-08-18 NOTE — Progress Notes (Signed)
Virtual Visit via Telephone Note  I connected with Maria Duncan on 08/18/20 at  8:30 AM EST by telephone and verified that I am speaking with the correct person using two identifiers.  Location: Patient: home Provider: remote   I discussed the limitations, risks, security and privacy concerns of performing an evaluation and management service by telephone and the availability of in person appointments. I also discussed with the patient that there may be a patient responsible charge related to this service. The patient expressed understanding and agreed to proceed.  Chief Complaint  Patient presents with  . Hospitalization Follow-up    COVID +12/28 On 3L of O2, stats 94%-98%. Still having shortness of breath and coughing.     History of Present Illness:  Patient presents today for post COVID care clinic visit/hospital follow-up.  Patient was admitted to the hospital on 07/27/2020 through 08/07/2020.  Patient was treated with remdesivir, Actemra, ceftriaxone, azithromycin.  She was discharged home on 3 L of O2.  She does have her oxygen at home and is still using 3 L of oxygen continuously.  She does have a pulse oximeter states that her O2 sats are running between 94 to 98%.  She has been working on deep breathing exercises.  We discussed that she can try to wean her oxygen as long as her O2 sats remained above 93%.  Patient is trying to stay active but states that it makes her too short of breath to go up and down the stairs in her home.  She is trying to walk around downstairs for exercise. Denies f/c/s, n/v/d, hemoptysis, PND, chest pain or edema.    Observations/Objective:  Vitals with BMI 08/14/2020 08/07/2020 08/07/2020  Height 5\' 5"  - -  Weight 228 lbs 229 lbs 4 oz -  BMI 37.94 38.15 -  Systolic 108 100  Diastolic 66 64 79  Pulse 86 77 80      Assessment and Plan:  Covid pneumonia Acute hypoxic respiratory failure:   Stay well hydrated  Stay active  Deep breathing  exercises  May take tylenol for fever or pain  May take mucinex twice daily   Follow Up Instructions:  Follow up in 1 week or sooner if needed - will need lab work - will need follow up imaging in 2 weeks    I discussed the assessment and treatment plan with the patient. The patient was provided an opportunity to ask questions and all were answered. The patient agreed with the plan and demonstrated an understanding of the instructions.   The patient was advised to call back or seek an in-person evaluation if the symptoms worsen or if the condition fails to improve as anticipated.  I provided 22 minutes of non-face-to-face time during this encounter.   573, NP

## 2020-08-20 NOTE — Telephone Encounter (Signed)
TC placed for [facility] management call.  Patient states she is unsure where she and her husband will end up obtaining PCP care from, but wants to keep HFU appt with [facility].  At this time, HFU for patient is scheduled for 08/25/20 @ 2:45 w/ Dr. Imogene Burn and her husband is scheduled for 08/25/20 @ 0945 w/ Dr. Imogene Burn.  Patient and her husband need to have back to back appt's w/ same MD or same time appts with 2 different MD's.  Pt states they are unable to make 2 separate trips to the clinic in the same day.  Will forward to front desk to assist patient in rescheduling appt's for HFU.

## 2020-08-24 NOTE — Telephone Encounter (Signed)
Patient is aware both appointment have now been updated for 08/31/2020 1:15 pm and 3:15 pm with Dr. Imogene Burn.

## 2020-08-25 ENCOUNTER — Encounter: Payer: Self-pay | Admitting: Student

## 2020-08-26 ENCOUNTER — Ambulatory Visit (INDEPENDENT_AMBULATORY_CARE_PROVIDER_SITE_OTHER): Payer: HRSA Program | Admitting: Nurse Practitioner

## 2020-08-26 VITALS — BP 128/74 | HR 98 | Temp 97.8°F | Ht 65.0 in | Wt 233.0 lb

## 2020-08-26 DIAGNOSIS — J1282 Pneumonia due to coronavirus disease 2019: Secondary | ICD-10-CM | POA: Diagnosis not present

## 2020-08-26 DIAGNOSIS — U071 COVID-19: Secondary | ICD-10-CM | POA: Diagnosis not present

## 2020-08-26 DIAGNOSIS — E559 Vitamin D deficiency, unspecified: Secondary | ICD-10-CM

## 2020-08-26 NOTE — Patient Instructions (Addendum)
Covid pneumonia:   Stay well hydrated  Stay active  Deep breathing exercises  May take tylenol or fever or pain  May take mucinex  twice daily  Will order labs  May wean O2 to keep sats above 93%  Will order chest x ray:  [facility] [address] [place], [place] [address] [phone] MON - FRI 8:00 AM - 4:00 PM - WALK IN   Follow up:  Follow up in 2 weeks or sooner if needed  

## 2020-08-26 NOTE — Progress Notes (Signed)
@Patient  ID: , female    DOB: [birth year], 55 y.o.   MRN: [id number]  Chief Complaint  Patient presents with  . Follow-up    3L, she does take off for about 2 hours a day stats stay normal 94-96% Sx: diarrhea and constipation go back and forth.     Referring provider: No ref. provider found   55 year old female with history of hypertension, combined congestive systolic and diastolic heart failure, pulmonary hypertension, CAD, sleep apnea, diabetes, obesity  HPI  Patient presents today for post COVID care clinic follow-up.  She was last seen through televisit on 08/18/2020 for hospital follow-up.  Patient had been discharged home from the hospital on 3 L of O2 continuously.  She states that during the day she does get without oxygen and does keep frequent checks on her O2 sats.  She states that usually at rest her O2 sats are 94 to 96% on room air.  With exertion she still needs 3 L of O2.  Patient is trying to stay active.  We will be setting patient up with an appointment to establish care with a new PCP.  Patient needs to be followed for chronic conditions.  Patient is due for follow-up in imaging. Denies f/c/s, n/v/d, hemoptysis, PND, chest pain or edema.       Allergies  Allergen Reactions  . Hyzaar [Losartan Potassium-Hctz] Nausea And Vomiting and Cough    Patient can take Entresto now     There is no immunization history on file for this patient.  Past Medical History:  Diagnosis Date  . 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 Chest pain of uncertain etiology, non obstructive CAD, pain due to acute HF 12/12/2019  . CHF (congestive heart failure) (HCC)   . Diabetes mellitus without complication (HCC)   . Hypertension   . Hypoventilation associated with obesity (HCC) 12/12/2019  . Morbid obesity (HCC)   . NICM (nonischemic cardiomyopathy) (HCC)   . OSA (obstructive sleep apnea) 12/12/2019  . Pulmonary hypertension, unspecified (HCC)  12/12/2019    Tobacco History: Social History   Tobacco Use  Smoking Status Never Smoker  Smokeless Tobacco Never Used   Counseling given: Yes   Outpatient Encounter Medications as of 08/26/2020  Medication Sig  . acetaminophen (TYLENOL) 325 MG tablet Take 2 tablets (650 mg total) by mouth every 4 (four) hours as needed for headache or mild pain.  10/24/2020 aspirin 81 MG EC tablet Take 1 tablet (81 mg total) by mouth daily.  . carvedilol (COREG) 6.25 MG tablet Take 1 tablet (6.25 mg total) by mouth 2 (two) times daily.  . cholecalciferol (VITAMIN D3) 25 MCG (1000 UNIT) tablet Take 1,000 Units by mouth daily.  . dapagliflozin propanediol (FARXIGA) 10 MG TABS tablet Take 1 tablet (10 mg total) by mouth daily before breakfast.  . docusate sodium (COLACE) 100 MG capsule Take 1 capsule (100 mg total) by mouth 2 (two) times daily.  . pantoprazole (PROTONIX) 40 MG tablet TAKE 1 TABLET (40 MG TOTAL) BY MOUTH DAILY. (Patient taking differently: Take 40 mg by mouth daily as needed (stomach).)  . polyethylene glycol (MIRALAX / GLYCOLAX) 17 g packet Take 17 g by mouth daily as needed (constipation). (Patient taking differently: Take 17 g by mouth daily as needed for mild constipation (constipation).)  . sacubitril-valsartan (ENTRESTO) 49-51 MG Take 1 tablet by mouth 2 (two) times daily.  Marland Kitchen torsemide (DEMADEX) 20 MG tablet TAKE 1 TABLET (20 MG TOTAL) BY  MOUTH 2 (TWO) TIMES DAILY. (Patient taking differently: Take 20 mg by mouth 2 (two) times daily.)  . TRUEplus Lancets 28G MISC 1 each by Does not apply route in the morning and at bedtime.   No facility-administered encounter medications on file as of 08/26/2020.     Review of Systems  Review of Systems  Constitutional: Positive for fatigue. Negative for fever.  HENT: Negative.   Respiratory: Positive for cough and shortness of breath.   Cardiovascular: Negative.  Negative for chest pain, palpitations and leg swelling.  Gastrointestinal: Negative.    Allergic/Immunologic: Negative.   Neurological: Negative.   Psychiatric/Behavioral: Negative.        Physical Exam  BP 128/74 (BP Location: Right Arm)   Pulse 98   Temp 97.8 F (36.6 C)   Ht 5\' 5"  (1.651 m)   Wt 233 lb (105.7 kg)   SpO2 97%   BMI 38.77 kg/m   Wt Readings from Last 5 Encounters:  08/26/20 233 lb (105.7 kg)  08/14/20 228 lb (103.4 kg)  08/07/20 229 lb 4.5 oz (104 kg)  07/23/20 238 lb (108 kg)  06/16/20 243 lb (110.2 kg)     Physical Exam Vitals and nursing note reviewed.  Constitutional:      General: She is not in acute distress.    Appearance: She is well-developed and well-nourished.  Cardiovascular:     Rate and Rhythm: Normal rate and regular rhythm.  Pulmonary:     Effort: Pulmonary effort is normal.     Breath sounds: Normal breath sounds.  Musculoskeletal:     Right lower leg: No edema.     Left lower leg: No edema.  Neurological:     Mental Status: She is alert and oriented to person, place, and time.  Psychiatric:        Mood and Affect: Mood and affect and mood normal.        Behavior: Behavior normal.        Imaging: DG Chest Port 1 View  Result Date: 08/03/2020 CLINICAL DATA:  Pneumonia EXAM: PORTABLE CHEST 1 VIEW COMPARISON:  Three days ago FINDINGS: Low volume chest with patchy bilateral pneumonia. The pattern and density is not convincingly changed from before when allowing for differences in technique. Cardiomegaly. No visible effusion or pneumothorax. IMPRESSION: Similar degree of bilateral pneumonia. Electronically Signed   By: 10/01/2020 M.D.   On: 08/03/2020 05:49   DG Chest Port 1 View  Result Date: 07/31/2020 CLINICAL DATA:  COVID pneumonia EXAM: PORTABLE CHEST 1 VIEW COMPARISON:  07/28/2020 FINDINGS: Lung volumes are extremely small and pulmonary insufflation has diminished since prior examination. Superimposed extensive mid and lower lung zone predominant pulmonary infiltrate has progressed in the interval since  prior examination. No pneumothorax or pleural effusion. Cardiac size within normal limits. IMPRESSION: Progressive multifocal pulmonary infiltrate. Progressive pulmonary hypoinflation. Electronically Signed   By: 09/25/2020 MD   On: 07/31/2020 06:10     Assessment & Plan:   Pneumonia due to COVID-19 virus Stay well hydrated  Stay active  Deep breathing exercises  May take tylenol or fever or pain  May take mucinex twice daily  Will order labs  May wean O2 to keep sats above 93%  Will order chest x ray:  [facility] [address] [place], [place] [address] MON - FRI 8:00 AM - 4:00 PM - WALK IN   Follow up:  Follow up in 2 weeks or sooner if needed  Ivonne Andrew, NP 08/27/2020

## 2020-08-27 ENCOUNTER — Telehealth: Payer: Self-pay | Admitting: Clinical

## 2020-08-27 DIAGNOSIS — E559 Vitamin D deficiency, unspecified: Secondary | ICD-10-CM | POA: Insufficient documentation

## 2020-08-27 LAB — COMPREHENSIVE METABOLIC PANEL
ALT: 23 IU/L (ref 0–32)
AST: 16 IU/L (ref 0–40)
Albumin/Globulin Ratio: 1.7 (ref 1.2–2.2)
Albumin: 3.9 g/dL (ref 3.8–4.9)
Alkaline Phosphatase: 116 IU/L (ref 44–121)
BUN/Creatinine Ratio: 11 (ref 9–23)
BUN: 11 mg/dL (ref 6–24)
Bilirubin Total: 0.3 mg/dL (ref 0.0–1.2)
CO2: 28 mmol/L (ref 20–29)
Calcium: 9.4 mg/dL (ref 8.7–10.2)
Chloride: 101 mmol/L (ref 96–106)
Creatinine, Ser: 1.04 mg/dL — ABNORMAL HIGH (ref 0.57–1.00)
GFR calc Af Amer: 70 mL/min/{1.73_m2} (ref >59)
GFR calc non Af Amer: 61 mL/min/{1.73_m2} (ref >59)
Globulin, Total: 2.3 g/dL (ref 1.5–4.5)
Glucose: 244 mg/dL — ABNORMAL HIGH (ref 65–99)
Potassium: 4.2 mmol/L (ref 3.5–5.2)
Sodium: 142 mmol/L (ref 134–144)
Total Protein: 6.2 g/dL (ref 6.0–8.5)

## 2020-08-27 LAB — CBC
Hematocrit: 34.7 % (ref 34.0–46.6)
Hemoglobin: 11.5 g/dL (ref 11.1–15.9)
MCH: 29.5 pg (ref 26.6–33.0)
MCHC: 33.1 g/dL (ref 31.5–35.7)
MCV: 89 fL (ref 79–97)
Platelets: 268 10*3/uL (ref 150–450)
RBC: 3.9 x10E6/uL (ref 3.77–5.28)
RDW: 14.8 % (ref 11.7–15.4)
WBC: 7.8 10*3/uL (ref 3.4–10.8)

## 2020-08-27 LAB — VITAMIN D 25 HYDROXY (VIT D DEFICIENCY, FRACTURES): Vit D, 25-Hydroxy: 15.9 ng/mL — ABNORMAL LOW (ref 30.0–100.0)

## 2020-08-27 NOTE — Telephone Encounter (Signed)
[facility]ated [facility]al [facility] Case Management Referral Note  08/27/2020 Name: Maria Duncan MRN: [id number] DOB: 24-Oct-1965 Maria Duncan is a 54 y.o. year old female who sees Pcp, No for primary care. LCSW was consulted to assess patient's needs and assist the patient with [employer] .  Interpreter: No.   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 Patient had health problems last year as well, and they were paying off some loans from the previous time she was unable to work. Patient and husband currently facing financial challenges because they have not been able to re-open their business. They are still physically recovering from COVID and unable to work due to lethargy and other complications. They are in need of financial assistance.   Intervention: Patient reported they have already applied for rental and utility assistance through DSS, about two weeks ago. They are awaiting determination on that application. They have not yet received any utility shut off notices or eviction notices. CSW advised patient to follow up with DSS immediately if they do receive notices.   Also referred patient to [facility] for Housing & Community Studies department, which can help with eviction mediation. Patient and husband are receiving emergency food stamps. Advised patient of food pantries available; will plan to follow up at patient's 2/17 PCP appointment to provide all resources in writing.   Patient also had questions about loans and grants for small businesses affected by COVID; CSW to look into these resources and follow up with patient.   Review of patient status, including review of consultants reports, relevant laboratory and other test results, and collaboration with appropriate care team members and the patient's provider was performed as part of  comprehensive patient evaluation and provision of services.    SDOH (Social Determinants of Health) assessments performed: No  Goals Addressed   None      Follow up Plan: 1. Will follow up with patient by phone and will provide additional resources in writing that will be available when [facility] comes to [facility]) for 09/10/20 PCP appointment.  Abigail Butts, LCSW [facility] [phone]

## 2020-08-27 NOTE — Assessment & Plan Note (Signed)
Stay well hydrated  Stay active  Deep breathing exercises  May take tylenol or fever or pain  May take mucinex twice daily  Will order labs  May wean O2 to keep sats above 93%  Will order chest x ray:  [facility] [address] [place], [place] [address] [phone] MON - FRI 8:00 AM - 4:00 PM - WALK IN   Follow up:  Follow up in 2 weeks or sooner if needed

## 2020-08-31 ENCOUNTER — Encounter: Payer: Self-pay | Admitting: Student

## 2020-08-31 ENCOUNTER — Telehealth: Payer: Self-pay | Admitting: Clinical

## 2020-08-31 NOTE — Telephone Encounter (Signed)
[facility] General Follow Up Note  08/31/2020 Name: Maria Duncan MRN: [id number] DOB: Aug 14, 1965 Maria Duncan is a 55 y.o. year old female who sees Pcp, No for primary care. LCSW was initially consulted to assist the patient with [employer].   Interpreter: No.   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 They were hospitalized due to the illness and continue to have related complications.  Ongoing Intervention: Today CSW called patient and provided additional resources. Patient had inquired last week about resources for [employer], such as Acupuncturist. Patient requested this information via email; CSW did send the resources via email after the call. Also provided information on [employer] for food assistance and reminded patient of assistance through [employer] for Housing and [employer]. CSW available from clinic as needed.  Review of patient status, including review of consultants reports, relevant laboratory and other test results, and collaboration with appropriate care team members and the patient's provider was performed as part of comprehensive patient evaluation and provision of services.     Follow up Plan: 1. CSW available from clinic as needed.  Abigail Butts, LCSW [facility] [phone]

## 2020-09-01 NOTE — Telephone Encounter (Signed)
of note, pt canceled HFU appt and did not reschedule.  No further action needed at this time,  phone call complete.Kingsley Spittle Cassady2/8/20222:09 PM

## 2020-09-08 ENCOUNTER — Other Ambulatory Visit: Payer: Self-pay

## 2020-09-08 ENCOUNTER — Ambulatory Visit
Admission: RE | Admit: 2020-09-08 | Discharge: 2020-09-08 | Disposition: A | Discharge status: Home / Self Care | Payer: Self-pay | Source: Ambulatory Visit | Attending: Nurse Practitioner | Admitting: Nurse Practitioner

## 2020-09-08 NOTE — Progress Notes (Signed)
ADVANCED HF CLINIC NOTE   Primary Care: [facility].  Primary HF Cardiologist: Dr Gala Romney  HPI:  Maria Duncan is 55 year old with a history of HTN, obesity, and NICM/systolic heart failure with recovered EF.   Admitted 5/21 with new onset HF. ECHO EF 35-40%. RV moderately HK.  RHC/LHC with minimal CAD and elevated filling pressures and preserved cardiac output. Diuresed with IV lasix and transitioned to torsemide 20 mg daily. Also concern for sleep apnea.   Echo 05/11/20: EF 55% RV ok Personally reviewed  Admitted 1/22 with respiratory failure due to COVID PNA. Was in hospital for 17 days. Now on O2. Starting to feel better. Still with some SOB and cough. No edema, orthopnea or PND.    Studies:   RHC/LHC 11/2019 Ao = 131/86 (109) LV = 138/29 RA = 9 RV = 58/15 PA = 63/23 (38) PCW = 21 Fick cardiac output/index = 5.2/2.4 PVR = 3.2 WU FA sat = 93% PA sat = 62%, 67% ABG: 7.27/88/81/93% Assessment: 1. 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 Severe NICM EF 25% suspect due to HTN and undiagnosed OSA 3. Elevated filling pressures with normal cardiac output 4. OHS/OSA with CO2 retention (patient received 2mg  versed and fentanyl during cath)    Past Medical History:  Diagnosis Date  . 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 Chest pain of uncertain etiology, non obstructive CAD, pain due to acute HF 12/12/2019  . CHF (congestive heart failure) (HCC)   . Diabetes mellitus without complication (HCC)   . Hypertension   . Hypoventilation associated with obesity (HCC) 12/12/2019  . Morbid obesity (HCC)   . NICM (nonischemic cardiomyopathy) (HCC)   . OSA (obstructive sleep apnea) 12/12/2019  . Pulmonary hypertension, unspecified (HCC) 12/12/2019    Current Outpatient Medications  Medication Sig Dispense Refill  . acetaminophen (TYLENOL) 325 MG tablet Take 2 tablets (650 mg total) by mouth every 4 (four) hours as needed for  headache or mild pain.    12/14/2019 aspirin 81 MG EC tablet Take 1 tablet (81 mg total) by mouth daily. 90 tablet 3  . carvedilol (COREG) 6.25 MG tablet Take 1 tablet (6.25 mg total) by mouth 2 (two) times daily. 60 tablet 3  . cholecalciferol (VITAMIN D3) 25 MCG (1000 UNIT) tablet Take 1,000 Units by mouth daily.    . dapagliflozin propanediol (FARXIGA) 10 MG TABS tablet Take 1 tablet (10 mg total) by mouth daily before breakfast. 30 tablet 11  . docusate sodium (COLACE) 100 MG capsule Take 1 capsule (100 mg total) by mouth 2 (two) times daily. 10 capsule 0  . pantoprazole (PROTONIX) 40 MG tablet TAKE 1 TABLET (40 MG TOTAL) BY MOUTH DAILY. (Patient taking differently: Take 40 mg by mouth daily as needed (stomach).) 30 tablet 11  . polyethylene glycol (MIRALAX / GLYCOLAX) 17 g packet Take 17 g by mouth daily as needed (constipation). (Patient taking differently: Take 17 g by mouth daily as needed for mild constipation (constipation).) 14 each 0  . sacubitril-valsartan (ENTRESTO) 49-51 MG Take 1 tablet by mouth 2 (two) times daily. 180 tablet 3  . torsemide (DEMADEX) 20 MG tablet TAKE 1 TABLET (20 MG TOTAL) BY MOUTH 2 (TWO) TIMES DAILY. 60 tablet 3  . TRUEplus Lancets 28G MISC 1 each by Does not apply route in the morning and at bedtime. 100 each 1   No current facility-administered medications for this encounter.    Allergies  Allergen Reactions  .  Hyzaar [Losartan Potassium-Hctz] Nausea And Vomiting and Cough    Patient can take Sherryll Burger now      Social History   Socioeconomic History  . Marital status: Married    Spouse name: Not on file  . Number of children: Not on file  . Years of education: Not on file  . Highest education level: Not on file  Occupational History  . Not on file  Tobacco Use  . Smoking status: Never Smoker  . Smokeless tobacco: Never Used  Substance and Sexual Activity  . Alcohol use: No  . Drug use: No  . Sexual activity: Not on file  Other Topics Concern  . Not  on file  Social History Narrative  . Not on file   Social Determinants of Health   Financial Resource Strain: Not on file  Food Insecurity: Not on file  Transportation Needs: Not on file  Physical Activity: Not on file  Stress: Not on file  Social Connections: Not on file  Intimate Partner Violence: Not on file      Family History  Problem Relation Age of Onset  . Cancer Mother   . Hyperlipidemia Mother   . Diabetes Father   . Hypertension Father   . Hyperlipidemia Sister   . Hypertension Sister   . Hyperlipidemia Brother   . Hypertension Brother   . Cancer Maternal Grandmother   . Stroke Maternal Grandmother   . Cancer Maternal Grandfather   . Diabetes Maternal Grandfather   . Heart disease Maternal Grandfather   . Hypertension Maternal Grandfather   . Diabetes Paternal Grandmother   . Hypertension Paternal Grandmother   . Cancer Paternal Grandfather   . Hypertension Paternal Grandfather   . Stroke Paternal Grandfather     Vitals:   09/09/20 1049  BP: 122/78  Pulse: 74  SpO2: 100%  Weight: 106.9 kg (235 lb 9.6 oz)   Wt Readings from Last 3 Encounters:  09/09/20 106.9 kg (235 lb 9.6 oz)  08/26/20 105.7 kg (233 lb)  08/14/20 103.4 kg (228 lb)    PHYSICAL EXAM: General:  Well appearing. No resp difficulty HEENT: normal wearing O2 Neck: supple. no JVD. Carotids 2+ bilat; no bruits. No lymphadenopathy or thryomegaly appreciated. Cor: PMI nondisplaced. Regular rate & rhythm. No rubs, gallops or murmurs. Lungs: clear Abdomen: obese soft, nontender, nondistended. No hepatosplenomegaly. No bruits or masses. Good bowel sounds. Extremities: no cyanosis, clubbing, rash, edema Neuro: alert & orientedx3, cranial nerves grossly intact. moves all 4 extremities w/o difficulty. Affect pleasant    ASSESSMENT & PLAN:  1. Chronic Systolic Heart Failure, NICM (likely hypertensive) - ECHO 11/2019 EF 35-40% RV moderately reduced.  - Cath 11/2019 with minimal cardiac disease  and preserved cardiac output. EF on cath 25%.  - Echo t10/18/21: EF 55% RV ok Personally reviewed - Improved from HF standpoint. NYHA II with recent COIVD infection - Volume status ok. Taking torsemide daily as needed - Continue carvedilol 6.25 mg twice a day.  - Continue entresto 49/51 mg twice a day  - Continue Spiro 25 mg daily - Continue Farxiga 10 - Recent blood work. Will repeat echo. If EF remains normal can graduate HF Clinic. F/u [facility]  2. HTN - Blood pressure well controlled. Continue current regimen.  3. Obesity  Body mass index is 39.21 kg/m. - Continue weight loss efforts   4. Suspected Sleep Apnea  - Continue CPAP. (needs new equipment) Follows with Dr. Mayford Knife  5. DMII - Followed by PCP - on Farxiga  6. Post-COVID syndrome - improving. Wean O2 as tolerated  Arvilla Meres MD 10:54 AM

## 2020-09-09 ENCOUNTER — Ambulatory Visit (HOSPITAL_COMMUNITY)
Admission: RE | Admit: 2020-09-09 | Discharge: 2020-09-09 | Disposition: A | Discharge status: Home / Self Care | Payer: Self-pay | Source: Ambulatory Visit | Attending: Internal Medicine | Admitting: Internal Medicine

## 2020-09-09 ENCOUNTER — Encounter (HOSPITAL_COMMUNITY): Payer: Self-pay | Admitting: Internal Medicine

## 2020-09-09 VITALS — BP 122/78 | HR 74 | Wt 235.6 lb

## 2020-09-09 DIAGNOSIS — Z7982 Long term (current) use of aspirin: Secondary | ICD-10-CM | POA: Insufficient documentation

## 2020-09-09 DIAGNOSIS — Z7984 Long term (current) use of oral hypoglycemic drugs: Secondary | ICD-10-CM | POA: Insufficient documentation

## 2020-09-09 DIAGNOSIS — I110 Hypertensive heart disease with heart failure: Secondary | ICD-10-CM | POA: Insufficient documentation

## 2020-09-09 DIAGNOSIS — U099 Post covid-19 condition, unspecified: Secondary | ICD-10-CM | POA: Insufficient documentation

## 2020-09-09 DIAGNOSIS — I2510 Atherosclerotic heart disease of native coronary artery without angina pectoris: Secondary | ICD-10-CM

## 2020-09-09 DIAGNOSIS — I5042 Chronic combined systolic (congestive) and diastolic (congestive) heart failure: Secondary | ICD-10-CM | POA: Insufficient documentation

## 2020-09-09 DIAGNOSIS — E119 Type 2 diabetes mellitus without complications: Secondary | ICD-10-CM | POA: Insufficient documentation

## 2020-09-09 DIAGNOSIS — I428 Other cardiomyopathies: Secondary | ICD-10-CM

## 2020-09-09 DIAGNOSIS — I10 Essential (primary) hypertension: Secondary | ICD-10-CM

## 2020-09-09 DIAGNOSIS — I517 Cardiomegaly: Secondary | ICD-10-CM

## 2020-09-09 DIAGNOSIS — Z6839 Body mass index (BMI) 39.0-39.9, adult: Secondary | ICD-10-CM | POA: Insufficient documentation

## 2020-09-09 DIAGNOSIS — I5022 Chronic systolic (congestive) heart failure: Secondary | ICD-10-CM

## 2020-09-09 NOTE — Patient Instructions (Signed)
Congratulations, you have Graduated the heart failure clinic!  Your physician has requested that you have an echocardiogram. Echocardiography is a painless test that uses sound waves to create images of your heart. It provides your doctor with information about the size and shape of your heart and how well your heart's chambers and valves are working. This procedure takes approximately one hour. There are no restrictions for this procedure.  You have been referred to [facility] Dr. Duke Salvia, their office will call you to schedule an appointment in 6 months.  If you have any questions or concerns before your next appointment please send [place] a message through [place] or call our office at [phone].    TO LEAVE A MESSAGE FOR THE NURSE SELECT OPTION 2, PLEASE LEAVE A MESSAGE INCLUDING: . YOUR NAME . DATE OF BIRTH . CALL BACK NUMBER . REASON FOR CALL**this is important as we prioritize the call backs  YOU WILL RECEIVE A CALL BACK THE SAME DAY AS LONG AS YOU CALL BEFORE 4:00 PM

## 2020-09-09 NOTE — Addendum Note (Signed)
Encounter addended by: Samara Snide, RN on: 09/09/2020 11:13 AM  Actions taken: Visit diagnoses modified, Order list changed, Diagnosis association updated, Clinical Note Signed

## 2020-09-10 ENCOUNTER — Ambulatory Visit: Payer: Self-pay | Admitting: Nurse Practitioner

## 2020-09-16 ENCOUNTER — Ambulatory Visit (INDEPENDENT_AMBULATORY_CARE_PROVIDER_SITE_OTHER): Payer: HRSA Program | Admitting: Nurse Practitioner

## 2020-09-16 ENCOUNTER — Other Ambulatory Visit: Payer: Self-pay | Admitting: Nurse Practitioner

## 2020-09-16 DIAGNOSIS — U071 COVID-19: Secondary | ICD-10-CM

## 2020-09-16 DIAGNOSIS — J1282 Pneumonia due to coronavirus disease 2019: Secondary | ICD-10-CM

## 2020-09-16 MED ORDER — PREDNISONE 20 MG PO TABS: 20.0000 mg | ORAL_TABLET | Freq: Every day | ORAL | 0 refills | Status: DC

## 2020-09-16 NOTE — Progress Notes (Signed)
@Patient  ID: , female    DOB: [birth year], 55 y.o.   MRN: [id number]  Chief Complaint  Patient presents with  . Follow-up    2wk pneumonia    Referring provider: No ref. provider found     HPI  Patient presents for post COVID care clinic visit follow-up.  She was last seen in our office on 08/26/2020.  Patient states that overall she is improving she is no longer using oxygen.  She does have some shortness of breath with exertion.  She is trying to get back to work and work half day shifts.  Her last chest x-ray did show pneumonia but stated that it was improving. Denies f/c/s, n/v/d, hemoptysis, PND, chest pain or edema.     Allergies  Allergen Reactions  . Hyzaar [Losartan Potassium-Hctz] Nausea And Vomiting and Cough    Patient can take Entresto now    Immunization History  Administered Date(s) Administered  . Tdap 09/17/2020    Past Medical History:  Diagnosis Date  . 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 Chest pain of uncertain etiology, non obstructive CAD, pain due to acute HF 12/12/2019  . CHF (congestive heart failure) (HCC)   . Diabetes mellitus without complication (HCC)   . Hypertension   . Hypoventilation associated with obesity (HCC) 12/12/2019  . Morbid obesity (HCC)   . NICM (nonischemic cardiomyopathy) (HCC)   . OSA (obstructive sleep apnea) 12/12/2019  . Pulmonary hypertension, unspecified (HCC) 12/12/2019    Tobacco History: Social History   Tobacco Use  Smoking Status Never Smoker  Smokeless Tobacco Never Used   Counseling given: Not Answered   Outpatient Encounter Medications as of 09/16/2020  Medication Sig  . predniSONE (DELTASONE) 20 MG tablet Take 1 tablet (20 mg total) by mouth daily with breakfast for 5 days.  09/18/2020 acetaminophen (TYLENOL) 325 MG tablet Take 2 tablets (650 mg total) by mouth every 4 (four) hours as needed for headache or mild pain.  Marland Kitchen aspirin 81 MG EC tablet Take 1 tablet (81 mg total) by  mouth daily.  . carvedilol (COREG) 6.25 MG tablet Take 1 tablet (6.25 mg total) by mouth 2 (two) times daily.  . cholecalciferol (VITAMIN D3) 25 MCG (1000 UNIT) tablet Take 1,000 Units by mouth daily.  . dapagliflozin propanediol (FARXIGA) 10 MG TABS tablet Take 1 tablet (10 mg total) by mouth daily before breakfast.  . docusate sodium (COLACE) 100 MG capsule Take 1 capsule (100 mg total) by mouth 2 (two) times daily.  . pantoprazole (PROTONIX) 40 MG tablet TAKE 1 TABLET (40 MG TOTAL) BY MOUTH DAILY. (Patient taking differently: Take 40 mg by mouth daily as needed (stomach).)  . polyethylene glycol (MIRALAX / GLYCOLAX) 17 g packet Take 17 g by mouth daily as needed (constipation). (Patient taking differently: Take 17 g by mouth daily as needed for mild constipation (constipation).)  . sacubitril-valsartan (ENTRESTO) 49-51 MG Take 1 tablet by mouth 2 (two) times daily.  Marland Kitchen torsemide (DEMADEX) 20 MG tablet TAKE 1 TABLET (20 MG TOTAL) BY MOUTH 2 (TWO) TIMES DAILY.  . TRUEplus Lancets 28G MISC 1 each by Does not apply route in the morning and at bedtime.   No facility-administered encounter medications on file as of 09/16/2020.     Review of Systems  Review of Systems  Constitutional: Negative.  Negative for fatigue and fever.  HENT: Negative.   Respiratory: Positive for shortness of breath. Negative for cough.   Cardiovascular: Negative.  Negative  for chest pain, palpitations and leg swelling.  Gastrointestinal: Negative.   Allergic/Immunologic: Negative.   Neurological: Negative.   Psychiatric/Behavioral: Negative.        Physical Exam  BP 113/89 (BP Location: Left Arm, Patient Position: Sitting, Cuff Size: Large)   Pulse 98   Temp 97.7 F (36.5 C) (Oral)   Resp 18   Ht 5' 4.5" (1.638 m)   Wt 229 lb (103.9 kg)   SpO2 97%   BMI 38.70 kg/m   Wt Readings from Last 5 Encounters:  09/17/20 230 lb (104.3 kg)  09/16/20 229 lb (103.9 kg)  09/09/20 235 lb 9.6 oz (106.9 kg)  08/26/20  233 lb (105.7 kg)  08/14/20 228 lb (103.4 kg)     Physical Exam Vitals and nursing note reviewed.  Constitutional:      General: She is not in acute distress.    Appearance: She is well-developed and well-nourished.  Cardiovascular:     Rate and Rhythm: Normal rate and regular rhythm.  Pulmonary:     Effort: Pulmonary effort is normal.     Breath sounds: Normal breath sounds.  Musculoskeletal:     Right lower leg: No edema.     Left lower leg: No edema.  Neurological:     Mental Status: She is alert and oriented to person, place, and time.  Psychiatric:        Mood and Affect: Mood and affect and mood normal.        Behavior: Behavior normal.       Imaging: DG Chest 2 View  Result Date: 09/09/2020 CLINICAL DATA:  COVID pneumonia. EXAM: CHEST - 2 VIEW COMPARISON:  August 03, 2020. FINDINGS: Stable cardiomegaly. No pneumothorax or pleural effusion is noted. Decreased bilateral lung opacities are noted suggesting improving bilateral pneumonia. Bony thorax is unremarkable. IMPRESSION: Probable improving bilateral pneumonia. Electronically Signed   By: Lupita Raider M.D.   On: 09/09/2020 08:26     Assessment & Plan:   Pneumonia due to COVID-19 virus Cough:   Stay well hydrated  Stay active  Deep breathing exercises  May take tylenol or fever or pain  May take mucinex twice daily  May take delsym   Will order prednisone     Follow up:  Follow up in 4 weeks or sooner if needed      Ivonne Andrew, NP 09/17/2020

## 2020-09-16 NOTE — Progress Notes (Signed)
Patient has taken medication today but patient has not eaten today. Patient denies pain at this time.

## 2020-09-16 NOTE — Patient Instructions (Addendum)
History of Covid 19 Cough:   Stay well hydrated  Stay active  Deep breathing exercises  May take tylenol or fever or pain  May take mucinex twice daily  May take delsym   Will order prednisone     Follow up:  Follow up in 4 weeks or sooner if needed

## 2020-09-17 ENCOUNTER — Encounter: Payer: Self-pay | Admitting: Nurse Practitioner

## 2020-09-17 ENCOUNTER — Ambulatory Visit (INDEPENDENT_AMBULATORY_CARE_PROVIDER_SITE_OTHER): Payer: Self-pay | Admitting: Nurse Practitioner

## 2020-09-17 ENCOUNTER — Other Ambulatory Visit: Payer: Self-pay

## 2020-09-17 ENCOUNTER — Other Ambulatory Visit: Payer: Self-pay | Admitting: Nurse Practitioner

## 2020-09-17 VITALS — BP 103/52 | HR 70 | Ht 64.5 in | Wt 230.0 lb

## 2020-09-17 DIAGNOSIS — Z1159 Encounter for screening for other viral diseases: Secondary | ICD-10-CM

## 2020-09-17 DIAGNOSIS — I5041 Acute combined systolic (congestive) and diastolic (congestive) heart failure: Secondary | ICD-10-CM | POA: Diagnosis not present

## 2020-09-17 DIAGNOSIS — I10 Essential (primary) hypertension: Secondary | ICD-10-CM

## 2020-09-17 DIAGNOSIS — Z13220 Encounter for screening for lipoid disorders: Secondary | ICD-10-CM | POA: Diagnosis not present

## 2020-09-17 DIAGNOSIS — H5789 Other specified disorders of eye and adnexa: Secondary | ICD-10-CM

## 2020-09-17 DIAGNOSIS — Z23 Encounter for immunization: Secondary | ICD-10-CM

## 2020-09-17 DIAGNOSIS — Z8616 Personal history of COVID-19: Secondary | ICD-10-CM

## 2020-09-17 DIAGNOSIS — Z1231 Encounter for screening mammogram for malignant neoplasm of breast: Secondary | ICD-10-CM

## 2020-09-17 MED ORDER — BENZONATATE 100 MG PO CAPS: 100.0000 mg | ORAL_CAPSULE | Freq: Three times a day (TID) | ORAL | 0 refills | Status: DC | PRN

## 2020-09-17 MED ORDER — OLOPATADINE HCL 0.1 % OP SOLN: 1.0000 [drp] | Freq: Two times a day (BID) | OPHTHALMIC | 12 refills | Status: DC

## 2020-09-17 MED FILL — predniSONE 20 MG TABS: 20 | 5 days supply | Qty: 5 | Fill #0

## 2020-09-17 MED FILL — PANTOPRAZOLE SOD DR 40 MG T: 40 | 30 days supply | Qty: 30 | Fill #4

## 2020-09-17 MED FILL — BENZONATATE 100 MG CAPS: 100 | 10 days supply | Qty: 30 | Fill #0

## 2020-09-17 MED FILL — OLOPATADINE HCL 0.1% EYE DR: 0.1 | 30 days supply | Qty: 5 | Fill #0

## 2020-09-17 MED FILL — CARVEDILOL 6.25 MG TABLET: 6.25 | 30 days supply | Qty: 60 | Fill #3

## 2020-09-17 MED FILL — FARXIGA 10 MG TABLET: 10 | 30 days supply | Qty: 30 | Fill #6

## 2020-09-17 NOTE — Progress Notes (Signed)
[facility] Patient [facility] [address] [place], [place]  [address] Phone:  [phone]   Fax:  [phone]   New Patient Office Visit  Subjective:  Patient ID: Chrysa Rampy, female    DOB: 1965-09-15  Age: 55 y.o. MRN: [id number]  CC:  Chief Complaint  Patient presents with  . Establish Care  . Eye Problem    Right side    HPI Maria Duncan presents for establish care. 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  On 07/27/2020 she was diagnosed Covid pneumonia and acute respiratory failure and was hospitalized along with her husband.  She was admitted to the ICU during her hospital course she did receive remdesivir, Actemra, ceftriaxone and azithromycin.  In May 2021 she was diagnosed with heart failure.  She admits that she has been following up closely with cardiology. She admits that she does not have DM. She feels like it was due to the steroids. She admits that she will be on one additional dose of steroid due to her cough.  This morning she woke up with right pain and irritation.  She has not used any treatment at this time except for cold compress.  She denies any fever, chills, shortness of breath, chest pains.  She admits that she does have to use 3 L of oxygen at night along with her CPAP due to shortness of breath.   Past Medical History:  Diagnosis Date  . 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 Chest pain of uncertain etiology, non obstructive CAD, pain due to acute HF 12/12/2019  .  CHF (congestive heart failure) (HCC)   . Diabetes mellitus without complication (HCC)   . Hypertension   . Hypoventilation associated with obesity (HCC) 12/12/2019  . Morbid obesity (HCC)   . NICM (nonischemic cardiomyopathy) (HCC)   . OSA (obstructive sleep apnea) 12/12/2019  . Pulmonary hypertension, unspecified (HCC) 12/12/2019    Past Surgical History:  Procedure Laterality Date  . ABDOMINAL HYSTERECTOMY    . RIGHT/LEFT HEART CATH AND CORONARY ANGIOGRAPHY N/A 12/09/2019   Procedure: RIGHT/LEFT HEART CATH AND CORONARY ANGIOGRAPHY;  Surgeon: Dolores Patty, MD;  Location: [facility];  Service: Cardiovascular;  Laterality: N/A;    Family History  Problem Relation Age of Onset  . Cancer Mother   . Hyperlipidemia Mother   . Diabetes Father   . Hypertension Father   . Hyperlipidemia Sister   . Hypertension Sister   . Hyperlipidemia Brother   . Hypertension Brother   . Cancer Maternal Grandmother   . Stroke Maternal Grandmother   . Cancer Maternal Grandfather   . Diabetes Maternal Grandfather   . Heart disease Maternal Grandfather   . Hypertension Maternal Grandfather   . Diabetes Paternal Grandmother   . Hypertension Paternal Grandmother   . Cancer Paternal Grandfather   . Hypertension Paternal Grandfather   . Stroke Paternal Grandfather     Social History   Socioeconomic History  . Marital status: Married    Spouse name: Not on file  . Number of children: Not on file  . Years of education: Not on file  .  Highest education level: Not on file  Occupational History  . Not on file  Tobacco Use  . Smoking status: Never Smoker  . Smokeless tobacco: Never Used  Vaping Use  . Vaping Use: Never used  Substance and Sexual Activity  . Alcohol use: No  . Drug use: No  . Sexual activity: Yes  Other Topics Concern  . Not on file  Social History Narrative  . Not on file   Social Determinants of Health   Financial Resource Strain: Not on file  Food  Insecurity: Not on file  Transportation Needs: Not on file  Physical Activity: Not on file  Stress: Not on file  Social Connections: Not on file  Intimate Partner Violence: Not on file    ROS Review of Systems  Constitutional:       Activity level is low  Eyes: Positive for visual disturbance (right eye).  Respiratory:       Uses 3L using this mostly at night 94% on RA    Objective:   Today's Vitals: BP (!) 103/52   Pulse 70   Ht 5' 4.5" (1.638 m)   Wt 230 lb (104.3 kg)   BMI 38.87 kg/m   Physical Exam Constitutional:      Appearance: She is obese.  HENT:     Head: Normocephalic and atraumatic.  Eyes:     Pupils: Pupils are equal, round, and reactive to light.     Comments: Right lower irritation  sclera  Cardiovascular:     Rate and Rhythm: Normal rate and regular rhythm.     Pulses: Normal pulses.     Heart sounds: Normal heart sounds.  Pulmonary:     Effort: Pulmonary effort is normal.     Breath sounds: Normal breath sounds.  Musculoskeletal:        General: Normal range of motion.     Cervical back: Normal range of motion.  Skin:    General: Skin is warm and dry.     Capillary Refill: Capillary refill takes less than 2 seconds.  Neurological:     General: No focal deficit present.     Mental Status: She is alert and oriented to person, place, and time.  Psychiatric:        Mood and Affect: Mood normal.        Behavior: Behavior normal.        Thought Content: Thought content normal.        Judgment: Judgment normal.     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 Metabolic Panel (12)    Other Visit Diagnoses    Personal history of COVID-19    -  Primary Encouraged COVID-19 vaccination patient, family and friends  Discussed precautionary measures due to the risk of reinfection; wearing proper fitting mask, avoiding handling the mask especially the outside without proper hand hygiene each time, washing hands often when coming in contact with known and unknown surfaces, using sanitizer when handwashing stations are not available, social distancing, and encouraging family members to do the same.  Most importantly when you are sick and in close contact with others, all are considered exposed and should remain in quarantine for 5 to 14 days days. It is a case by case bases for quarantining.   If you have questions please contact our office via phone or MyChart.      Relevant Orders   CBC with Differential/Platelet   Screening for cholesterol level       Relevant Orders   Lipid panel   Encounter for hepatitis C screening test for low risk patient       Relevant Orders   Hepatitis C antibody   Encounter for screening mammogram for malignant neoplasm of breast       Relevant Orders   MM DIGITAL SCREENING BILATERAL  Irritation of right eye Pataday eyedrops trial    Outpatient Encounter Medications as of 09/17/2020  Medication Sig  . acetaminophen (TYLENOL) 325 MG tablet Take 2 tablets (650 mg total) by mouth every 4 (four) hours as needed for headache or mild pain.  . Ascorbic Acid (VITAMIN C PO) Take by mouth.  Marland Kitchen aspirin 81 MG EC tablet Take 1 tablet (81 mg total) by mouth daily.  . benzonatate (TESSALON) 100 MG capsule Take 1 capsule (100 mg total) by mouth 3 (three) times daily as needed for up to 10 days for cough. Never suck or chew on a benzonatate capsule.  . carvedilol (COREG) 6.25 MG tablet Take 1 tablet (6.25 mg total) by mouth 2 (two) times daily.  .  cholecalciferol (VITAMIN D3) 25 MCG (1000 UNIT) tablet Take 1,000 Units by mouth daily.  . dapagliflozin propanediol (FARXIGA) 10 MG TABS tablet Take 1 tablet (10 mg total) by mouth daily before breakfast.  . docusate sodium (COLACE) 100 MG capsule Take 1 capsule (100 mg total) by mouth 2 (two) times daily.  . Multiple Vitamins-Minerals (ZINC PO) Take by mouth.  Marland Kitchen olopatadine (PATADAY) 0.1 % ophthalmic solution Place 1 drop into the right eye 2 (two) times daily.  . pantoprazole (PROTONIX) 40 MG tablet TAKE 1 TABLET (40 MG TOTAL) BY MOUTH DAILY. (Patient taking differently: Take 40 mg by mouth daily as needed (stomach).)  . polyethylene glycol (MIRALAX / GLYCOLAX) 17 g packet Take 17 g by mouth daily as needed (constipation). (Patient taking differently: Take 17 g by mouth daily as needed for mild constipation (constipation).)  . predniSONE (DELTASONE) 20 MG tablet Take 1 tablet (20 mg total) by mouth daily with breakfast for 5 days.  . sacubitril-valsartan (ENTRESTO) 49-51 MG Take 1 tablet by mouth 2 (two) times daily.  Marland Kitchen torsemide (DEMADEX) 20 MG tablet TAKE 1 TABLET (20 MG TOTAL) BY MOUTH 2 (TWO) TIMES DAILY.  . TRUEplus Lancets 28G MISC 1 each by Does not apply route in the morning and at bedtime.   No facility-administered encounter medications on file as of 09/17/2020.    Follow-up: Return in about 4 weeks (around 10/15/2020) for Physcial VISIT,EST,40-64 [99396].   Barbette Merino, NP

## 2020-09-17 NOTE — Patient Instructions (Signed)
Health Maintenance, Female Adopting a healthy lifestyle and getting preventive care are important in promoting health and wellness. Ask your health care provider about:  The right schedule for you to have regular tests and exams.  Things you can do on your own to prevent diseases and keep yourself healthy. What should I know about diet, weight, and exercise? Eat a healthy diet  Eat a diet that includes plenty of vegetables, fruits, low-fat dairy products, and lean protein.  Do not eat a lot of foods that are high in solid fats, added sugars, or sodium.   Maintain a healthy weight Body mass index (BMI) is used to identify weight problems. It estimates body fat based on height and weight. Your health care provider can help determine your BMI and help you achieve or maintain a healthy weight. Get regular exercise Get regular exercise. This is one of the most important things you can do for your health. Most adults should:  Exercise for at least 150 minutes each week. The exercise should increase your heart rate and make you sweat (moderate-intensity exercise).  Do strengthening exercises at least twice a week. This is in addition to the moderate-intensity exercise.  Spend less time sitting. Even light physical activity can be beneficial. Watch cholesterol and blood lipids Have your blood tested for lipids and cholesterol at 55 years of age, then have this test every 5 years. Have your cholesterol levels checked more often if:  Your lipid or cholesterol levels are high.  You are older than 55 years of age.  You are at high risk for heart disease. What should I know about cancer screening? Depending on your health history and family history, you may need to have cancer screening at various ages. This may include screening for:  Breast cancer.  Cervical cancer.  Colorectal cancer.  Skin cancer.  Lung cancer. What should I know about heart disease, diabetes, and high blood  pressure? Blood pressure and heart disease  High blood pressure causes heart disease and increases the risk of stroke. This is more likely to develop in people who have high blood pressure readings, are of African descent, or are overweight.  Have your blood pressure checked: ? Every 3-5 years if you are 18-39 years of age. ? Every year if you are 40 years old or older. Diabetes Have regular diabetes screenings. This checks your fasting blood sugar level. Have the screening done:  Once every three years after age 40 if you are at a normal weight and have a low risk for diabetes.  More often and at a younger age if you are overweight or have a high risk for diabetes. What should I know about preventing infection? Hepatitis B If you have a higher risk for hepatitis B, you should be screened for this virus. Talk with your health care provider to find out if you are at risk for hepatitis B infection. Hepatitis C Testing is recommended for:  Everyone born from 1945 through 1965.  Anyone with known risk factors for hepatitis C. Sexually transmitted infections (STIs)  Get screened for STIs, including gonorrhea and chlamydia, if: ? You are sexually active and are younger than 55 years of age. ? You are older than 55 years of age and your health care provider tells you that you are at risk for this type of infection. ? Your sexual activity has changed since you were last screened, and you are at increased risk for chlamydia or gonorrhea. Ask your health care provider   if you are at risk.  Ask your health care provider about whether you are at high risk for HIV. Your health care provider may recommend a prescription medicine to help prevent HIV infection. If you choose to take medicine to prevent HIV, you should first get tested for HIV. You should then be tested every 3 months for as long as you are taking the medicine. Pregnancy  If you are about to stop having your period (premenopausal) and  you may become pregnant, seek counseling before you get pregnant.  Take 400 to 800 micrograms (mcg) of folic acid every day if you become pregnant.  Ask for birth control (contraception) if you want to prevent pregnancy. Osteoporosis and menopause Osteoporosis is a disease in which the bones lose minerals and strength with aging. This can result in bone fractures. If you are 65 years old or older, or if you are at risk for osteoporosis and fractures, ask your health care provider if you should:  Be screened for bone loss.  Take a calcium or vitamin D supplement to lower your risk of fractures.  Be given hormone replacement therapy (HRT) to treat symptoms of menopause. Follow these instructions at home: Lifestyle  Do not use any products that contain nicotine or tobacco, such as cigarettes, e-cigarettes, and chewing tobacco. If you need help quitting, ask your health care provider.  Do not use street drugs.  Do not share needles.  Ask your health care provider for help if you need support or information about quitting drugs. Alcohol use  Do not drink alcohol if: ? Your health care provider tells you not to drink. ? You are pregnant, may be pregnant, or are planning to become pregnant.  If you drink alcohol: ? Limit how much you use to 0-1 drink a day. ? Limit intake if you are breastfeeding.  Be aware of how much alcohol is in your drink. In the [place] one drink equals one 12 oz bottle of beer (355 mL), one 5 oz glass of wine (148 mL), or one 1 oz glass of hard liquor (44 mL). General instructions  Schedule regular health, dental, and eye exams.  Stay current with your vaccines.  Tell your health care provider if: ? You often feel depressed. ? You have ever been abused or do not feel safe at home. Summary  Adopting a healthy lifestyle and getting preventive care are important in promoting health and wellness.  Follow your health care provider's instructions about healthy  diet, exercising, and getting tested or screened for diseases.  Follow your health care provider's instructions on monitoring your cholesterol and blood pressure. This information is not intended to replace advice given to you by your health care provider. Make sure you discuss any questions you have with your health care provider. Document Revised: 07/04/2018 Document Reviewed: 07/04/2018 Elsevier Patient Education  2021 [employer]vier Inc.  

## 2020-09-17 NOTE — Assessment & Plan Note (Signed)
Cough:   Stay well hydrated  Stay active  Deep breathing exercises  May take tylenol or fever or pain  May take mucinex twice daily  May take delsym   Will order prednisone     Follow up:  Follow up in 4 weeks or sooner if needed

## 2020-09-18 LAB — CBC WITH DIFFERENTIAL/PLATELET
Basophils Absolute: 0 10*3/uL (ref 0.0–0.2)
Basos: 1 %
EOS (ABSOLUTE): 0.2 10*3/uL (ref 0.0–0.4)
Eos: 4 %
Hematocrit: 38.1 % (ref 34.0–46.6)
Hemoglobin: 12.5 g/dL (ref 11.1–15.9)
Immature Grans (Abs): 0 10*3/uL (ref 0.0–0.1)
Immature Granulocytes: 0 %
Lymphocytes Absolute: 2.4 10*3/uL (ref 0.7–3.1)
Lymphs: 41 %
MCH: 29.4 pg (ref 26.6–33.0)
MCHC: 32.8 g/dL (ref 31.5–35.7)
MCV: 90 fL (ref 79–97)
Monocytes Absolute: 0.6 10*3/uL (ref 0.1–0.9)
Monocytes: 10 %
Neutrophils Absolute: 2.7 10*3/uL (ref 1.4–7.0)
Neutrophils: 44 %
Platelets: 266 10*3/uL (ref 150–450)
RBC: 4.25 x10E6/uL (ref 3.77–5.28)
RDW: 14 % (ref 11.7–15.4)
WBC: 5.9 10*3/uL (ref 3.4–10.8)

## 2020-09-18 LAB — LIPID PANEL
Chol/HDL Ratio: 5.2 ratio — ABNORMAL HIGH (ref 0.0–4.4)
Cholesterol, Total: 299 mg/dL — ABNORMAL HIGH (ref 100–199)
HDL: 58 mg/dL (ref >39)
LDL Chol Calc (NIH): 222 mg/dL — ABNORMAL HIGH (ref 0–99)
Triglycerides: 108 mg/dL (ref 0–149)
VLDL Cholesterol Cal: 19 mg/dL (ref 5–40)

## 2020-09-18 LAB — COMP. METABOLIC PANEL (12)
AST: 14 IU/L (ref 0–40)
Albumin/Globulin Ratio: 1.7 (ref 1.2–2.2)
Albumin: 4.3 g/dL (ref 3.8–4.9)
Alkaline Phosphatase: 102 IU/L (ref 44–121)
BUN/Creatinine Ratio: 15 (ref 9–23)
BUN: 16 mg/dL (ref 6–24)
Bilirubin Total: 0.6 mg/dL (ref 0.0–1.2)
Calcium: 9.9 mg/dL (ref 8.7–10.2)
Chloride: 97 mmol/L (ref 96–106)
Creatinine, Ser: 1.1 mg/dL — ABNORMAL HIGH (ref 0.57–1.00)
GFR calc Af Amer: 66 mL/min/{1.73_m2} (ref >59)
GFR calc non Af Amer: 57 mL/min/{1.73_m2} — ABNORMAL LOW (ref >59)
Globulin, Total: 2.5 g/dL (ref 1.5–4.5)
Glucose: 150 mg/dL — ABNORMAL HIGH (ref 65–99)
Potassium: 4.2 mmol/L (ref 3.5–5.2)
Sodium: 138 mmol/L (ref 134–144)
Total Protein: 6.8 g/dL (ref 6.0–8.5)

## 2020-09-18 LAB — HEPATITIS C ANTIBODY: Hep C Virus Ab: <0.1 s/co ratio (ref 0.0–0.9)

## 2020-09-22 ENCOUNTER — Telehealth: Payer: Self-pay | Admitting: Nurse Practitioner

## 2020-09-22 ENCOUNTER — Other Ambulatory Visit: Payer: Self-pay | Admitting: Family Medicine

## 2020-09-22 ENCOUNTER — Telehealth (HOSPITAL_COMMUNITY): Payer: Self-pay | Admitting: Nurse Practitioner

## 2020-09-22 ENCOUNTER — Other Ambulatory Visit: Payer: Self-pay | Admitting: Nurse Practitioner

## 2020-09-22 DIAGNOSIS — B379 Candidiasis, unspecified: Secondary | ICD-10-CM

## 2020-09-22 DIAGNOSIS — T3695XA Adverse effect of unspecified systemic antibiotic, initial encounter: Secondary | ICD-10-CM

## 2020-09-22 MED ORDER — FLUCONAZOLE 150 MG PO TABS: 150.0000 mg | ORAL_TABLET | Freq: Once | ORAL | 0 refills | Status: DC

## 2020-09-22 MED ORDER — FLUCONAZOLE 150 MG PO TABS: 150.0000 mg | ORAL_TABLET | ORAL | 0 refills | Status: DC

## 2020-09-22 MED FILL — FLUCONAZOLE 150 MG TABS: 150 | 2 days supply | Qty: 2 | Fill #0

## 2020-09-22 NOTE — Telephone Encounter (Signed)
PROVIDER:  Brooke Dare MEDICATION:  Penicillin (to replace antibiotic that is causing vaginal itching) PHARMACY:  [facility] PHONE:  [phone]

## 2020-09-22 NOTE — Telephone Encounter (Signed)
Patient called and requested prescription for yeast infection. medication sent to pharmacy. Patient voiced understanding.

## 2020-09-22 NOTE — Telephone Encounter (Signed)
Duplicate see other encounter

## 2020-09-23 ENCOUNTER — Other Ambulatory Visit: Payer: Self-pay | Admitting: Nurse Practitioner

## 2020-09-23 NOTE — Telephone Encounter (Signed)
Order in on 09/22/20 thanks

## 2020-09-30 ENCOUNTER — Ambulatory Visit (HOSPITAL_COMMUNITY): Admission: RE | Admit: 2020-09-30 | Payer: Self-pay | Source: Ambulatory Visit

## 2020-10-07 ENCOUNTER — Ambulatory Visit: Payer: Self-pay

## 2020-10-28 ENCOUNTER — Ambulatory Visit: Payer: Self-pay | Admitting: Nurse Practitioner

## 2020-11-04 ENCOUNTER — Ambulatory Visit (HOSPITAL_COMMUNITY)
Admission: RE | Admit: 2020-11-04 | Discharge: 2020-11-04 | Disposition: A | Discharge status: Home / Self Care | Payer: Self-pay | Source: Ambulatory Visit | Attending: Internal Medicine | Admitting: Internal Medicine

## 2020-11-04 ENCOUNTER — Other Ambulatory Visit: Payer: Self-pay

## 2020-11-04 DIAGNOSIS — I5022 Chronic systolic (congestive) heart failure: Secondary | ICD-10-CM | POA: Insufficient documentation

## 2020-11-04 NOTE — Progress Notes (Signed)
  Echocardiogram 2D Echocardiogram has been performed.  Maria Duncan F 11/04/2020, 9:36 AM

## 2020-11-06 LAB — ECHOCARDIOGRAM COMPLETE
AR max vel: 1.63 cm2
AV Area VTI: 1.58 cm2
AV Area mean vel: 1.5 cm2
AV Mean grad: 8 mmHg
AV Peak grad: 13.4 mmHg
Ao pk vel: 1.83 m/s
Area-P 1/2: 2.37 cm2
Calc EF: 54.6 %
S' Lateral: 2.8 cm
Single Plane A2C EF: 61.7 %
Single Plane A4C EF: 57.8 %

## 2020-11-18 ENCOUNTER — Other Ambulatory Visit (HOSPITAL_COMMUNITY): Payer: Self-pay

## 2020-11-18 ENCOUNTER — Ambulatory Visit: Payer: Self-pay | Admitting: Cardiovascular Disease

## 2020-11-18 ENCOUNTER — Other Ambulatory Visit (HOSPITAL_COMMUNITY): Payer: Self-pay | Admitting: Internal Medicine

## 2020-11-18 MED ORDER — SPIRONOLACTONE 25 MG PO TABS
25.0000 mg | ORAL_TABLET | Freq: Every day | ORAL | 5 refills | Status: DC
  Filled 2020-11-18: qty 30, 30d supply, fill #0

## 2020-11-18 MED FILL — Dapagliflozin Propanediol Tab 10 MG (Base Equivalent): ORAL | 30 days supply | Qty: 30 | Fill #0 | Status: AC

## 2020-11-19 ENCOUNTER — Other Ambulatory Visit (HOSPITAL_COMMUNITY): Payer: Self-pay

## 2020-11-19 ENCOUNTER — Other Ambulatory Visit (HOSPITAL_COMMUNITY): Payer: Self-pay | Admitting: Internal Medicine

## 2020-11-19 ENCOUNTER — Other Ambulatory Visit (HOSPITAL_COMMUNITY): Payer: Self-pay | Admitting: *Deleted

## 2020-11-19 MED ORDER — CARVEDILOL 6.25 MG PO TABS
6.2500 mg | ORAL_TABLET | Freq: Two times a day (BID) | ORAL | 11 refills | Status: DC
  Filled 2020-11-19 (×2): qty 60, 30d supply, fill #0
  Filled 2021-01-20: qty 60, 30d supply, fill #1
  Filled 2021-04-13 – 2021-06-01 (×2): qty 60, 30d supply, fill #2
  Filled 2021-09-14: qty 60, 30d supply, fill #3
  Filled 2021-10-13: qty 60, 30d supply, fill #0

## 2020-11-19 MED ORDER — CARVEDILOL 6.25 MG PO TABS
6.2500 mg | ORAL_TABLET | Freq: Two times a day (BID) | ORAL | 11 refills | Status: DC
  Filled 2020-11-19: qty 60, 30d supply, fill #0

## 2020-11-24 ENCOUNTER — Other Ambulatory Visit (HOSPITAL_COMMUNITY): Payer: Self-pay

## 2020-12-16 ENCOUNTER — Ambulatory Visit: Payer: Self-pay | Admitting: Nurse Practitioner

## 2021-01-05 ENCOUNTER — Ambulatory Visit: Payer: Self-pay

## 2021-01-20 ENCOUNTER — Other Ambulatory Visit (HOSPITAL_COMMUNITY): Payer: Self-pay | Admitting: Internal Medicine

## 2021-01-20 ENCOUNTER — Other Ambulatory Visit: Payer: Self-pay | Admitting: Internal Medicine

## 2021-01-20 ENCOUNTER — Other Ambulatory Visit (HOSPITAL_COMMUNITY): Payer: Self-pay

## 2021-01-21 ENCOUNTER — Other Ambulatory Visit (HOSPITAL_COMMUNITY): Payer: Self-pay

## 2021-01-21 MED ORDER — DAPAGLIFLOZIN PROPANEDIOL 10 MG PO TABS
10.0000 mg | ORAL_TABLET | Freq: Every day | ORAL | 5 refills | Status: DC
  Filled 2021-01-21: qty 30, fill #0
  Filled 2021-01-21 (×2): qty 30, 30d supply, fill #0
  Filled 2021-04-13 – 2021-06-01 (×2): qty 30, 30d supply, fill #1
  Filled 2021-09-14: qty 30, 30d supply, fill #2

## 2021-01-21 MED FILL — Spironolactone Tab 25 MG: ORAL | 30 days supply | Qty: 30 | Fill #0 | Status: CN

## 2021-01-21 MED FILL — Spironolactone Tab 25 MG: ORAL | 30 days supply | Qty: 30 | Fill #0 | Status: AC

## 2021-01-21 NOTE — Telephone Encounter (Signed)
This is a CHF pt, Dr. Bensimhon pt 

## 2021-02-24 ENCOUNTER — Ambulatory Visit: Payer: Self-pay | Admitting: Nurse Practitioner

## 2021-03-03 ENCOUNTER — Other Ambulatory Visit: Payer: Self-pay

## 2021-03-03 ENCOUNTER — Ambulatory Visit
Admission: RE | Admit: 2021-03-03 | Discharge: 2021-03-03 | Disposition: A | Discharge status: Home / Self Care | Payer: Self-pay | Source: Ambulatory Visit | Attending: Nurse Practitioner | Admitting: Nurse Practitioner

## 2021-03-03 DIAGNOSIS — Z1231 Encounter for screening mammogram for malignant neoplasm of breast: Secondary | ICD-10-CM

## 2021-03-20 ENCOUNTER — Emergency Department (HOSPITAL_BASED_OUTPATIENT_CLINIC_OR_DEPARTMENT_OTHER): Payer: Self-pay

## 2021-03-20 ENCOUNTER — Emergency Department (HOSPITAL_BASED_OUTPATIENT_CLINIC_OR_DEPARTMENT_OTHER)
Admission: EM | Admit: 2021-03-20 | Discharge: 2021-03-20 | Disposition: A | Discharge status: Home / Self Care | Payer: Self-pay | Attending: Emergency Medicine | Admitting: Emergency Medicine

## 2021-03-20 ENCOUNTER — Other Ambulatory Visit: Payer: Self-pay

## 2021-03-20 ENCOUNTER — Encounter (HOSPITAL_BASED_OUTPATIENT_CLINIC_OR_DEPARTMENT_OTHER): Payer: Self-pay

## 2021-03-20 DIAGNOSIS — R110 Nausea: Secondary | ICD-10-CM | POA: Insufficient documentation

## 2021-03-20 DIAGNOSIS — I5041 Acute combined systolic (congestive) and diastolic (congestive) heart failure: Secondary | ICD-10-CM | POA: Insufficient documentation

## 2021-03-20 DIAGNOSIS — I110 Hypertensive heart disease with heart failure: Secondary | ICD-10-CM | POA: Insufficient documentation

## 2021-03-20 DIAGNOSIS — R109 Unspecified abdominal pain: Secondary | ICD-10-CM | POA: Insufficient documentation

## 2021-03-20 DIAGNOSIS — Z79899 Other long term (current) drug therapy: Secondary | ICD-10-CM | POA: Insufficient documentation

## 2021-03-20 DIAGNOSIS — E119 Type 2 diabetes mellitus without complications: Secondary | ICD-10-CM | POA: Insufficient documentation

## 2021-03-20 DIAGNOSIS — Z955 Presence of coronary angioplasty implant and graft: Secondary | ICD-10-CM | POA: Insufficient documentation

## 2021-03-20 LAB — URINALYSIS, ROUTINE W REFLEX MICROSCOPIC
Bilirubin Urine: NEGATIVE
Glucose, UA: NEGATIVE mg/dL
Hgb urine dipstick: NEGATIVE
Ketones, ur: NEGATIVE mg/dL
Leukocytes,Ua: NEGATIVE
Nitrite: NEGATIVE
Protein, ur: NEGATIVE mg/dL
Specific Gravity, Urine: 1.022 (ref 1.005–1.030)
pH: 6.5 (ref 5.0–8.0)

## 2021-03-20 LAB — CBC WITH DIFFERENTIAL/PLATELET
Abs Immature Granulocytes: 0.02 10*3/uL (ref 0.00–0.07)
Basophils Absolute: 0 10*3/uL (ref 0.0–0.1)
Basophils Relative: 0 %
Eosinophils Absolute: 0.2 10*3/uL (ref 0.0–0.5)
Eosinophils Relative: 3 %
HCT: 36.4 % (ref 36.0–46.0)
Hemoglobin: 11.5 g/dL — ABNORMAL LOW (ref 12.0–15.0)
Immature Granulocytes: 0 %
Lymphocytes Relative: 44 %
Lymphs Abs: 3.4 10*3/uL (ref 0.7–4.0)
MCH: 28 pg (ref 26.0–34.0)
MCHC: 31.6 g/dL (ref 30.0–36.0)
MCV: 88.8 fL (ref 80.0–100.0)
Monocytes Absolute: 0.6 10*3/uL (ref 0.1–1.0)
Monocytes Relative: 8 %
Neutro Abs: 3.5 10*3/uL (ref 1.7–7.7)
Neutrophils Relative %: 45 %
Platelets: 234 10*3/uL (ref 150–400)
RBC: 4.1 MIL/uL (ref 3.87–5.11)
RDW: 12.1 % (ref 11.5–15.5)
WBC: 7.8 10*3/uL (ref 4.0–10.5)
nRBC: 0 % (ref 0.0–0.2)

## 2021-03-20 LAB — BASIC METABOLIC PANEL
Anion gap: 7 (ref 5–15)
BUN: 17 mg/dL (ref 6–20)
CO2: 32 mmol/L (ref 22–32)
Calcium: 9.3 mg/dL (ref 8.9–10.3)
Chloride: 100 mmol/L (ref 98–111)
Creatinine, Ser: 0.9 mg/dL (ref 0.44–1.00)
GFR, Estimated: >60 mL/min (ref >60)
Glucose, Bld: 142 mg/dL — ABNORMAL HIGH (ref 70–99)
Potassium: 4.3 mmol/L (ref 3.5–5.1)
Sodium: 139 mmol/L (ref 135–145)

## 2021-03-20 MED ORDER — FENTANYL CITRATE (PF) 100 MCG/2ML IJ SOLN
100.0000 ug | Freq: Once | INTRAMUSCULAR | Status: AC
Start: 2021-03-20 — End: 2021-03-20
  Administered 2021-03-20: 100 ug via INTRAVENOUS
  Filled 2021-03-20: qty 2

## 2021-03-20 MED ORDER — HYDROCODONE-ACETAMINOPHEN 5-325 MG PO TABS
1.0000 | ORAL_TABLET | Freq: Once | ORAL | Status: AC
  Administered 2021-03-20: 1 via ORAL
  Filled 2021-03-20: qty 1

## 2021-03-20 MED ORDER — HYDROCODONE-ACETAMINOPHEN 5-325 MG PO TABS: 1.0000 | ORAL_TABLET | Freq: Four times a day (QID) | ORAL | 0 refills | Status: DC | PRN

## 2021-03-20 MED ORDER — ONDANSETRON HCL 4 MG/2ML IJ SOLN
4.0000 mg | Freq: Once | INTRAMUSCULAR | Status: AC
  Administered 2021-03-20: 4 mg via INTRAVENOUS
  Filled 2021-03-20: qty 2

## 2021-03-20 NOTE — ED Notes (Signed)
Patient transported to CT 

## 2021-03-20 NOTE — ED Notes (Signed)
Pt verbalizes understanding of discharge instructions. Opportunity for questioning and answers were provided. Armand removed by staff, pt discharged from ED to home. Educated to pick up Rx.  

## 2021-03-20 NOTE — ED Triage Notes (Addendum)
Pt is present for right flank pain x two days with nausea. Pt has taken tylenol with no relief. Denies V/D. No hx of kidney stones or hematuria. Pt states she thought she might have injured her back but the pain did not go away.

## 2021-03-20 NOTE — ED Provider Notes (Signed)
[facility] Provider Note: Lowella Dell, MD, FACEP  CSN: [id number] MRN: [id number] ARRIVAL: 03/20/21 at 0433 ROOM: DB011/DB011   CHIEF COMPLAINT  Flank Pain   HISTORY OF PRESENT ILLNESS  03/20/21 4:42 AM Young D Rising is a 55 y.o. female with 2 days of right flank pain radiating to her right lower quadrant.  The pain is worse with movement or palpation of the right flank but not with palpation of the abdomen.  She has difficulty finding a comfortable position.  She rates her pain as a 10 out of 10 and describes the pain is different than anything she has had in the past although it initially felt like she had "pulled something" in her back.  She denies any recent injury or heavy lifting.  She denies fever or chills.  She denies dysuria or hematuria.  She has had nausea but no vomiting or diarrhea.  She does get some transient relief when she moves her bowels.  She has taken Tylenol without relief.   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) (HCC)    Chest pain of uncertain etiology, non obstructive CAD, pain due to acute HF 12/12/2019   CHF (congestive heart failure) (HCC)    Diabetes mellitus without complication (HCC)    Hypertension    Hypoventilation associated with obesity (HCC) 12/12/2019   Morbid obesity (HCC)    NICM (nonischemic cardiomyopathy) (HCC)    OSA (obstructive sleep apnea) 12/12/2019   Pulmonary hypertension, unspecified (HCC) 12/12/2019    Past Surgical History:  Procedure Laterality Date   ABDOMINAL HYSTERECTOMY     RIGHT/LEFT HEART CATH AND CORONARY ANGIOGRAPHY N/A 12/09/2019   Procedure: RIGHT/LEFT HEART CATH AND CORONARY ANGIOGRAPHY;  Surgeon: Dolores Patty, MD;  Location: [facility];  Service: Cardiovascular;  Laterality: N/A;    Family History  Problem Relation Age of Onset   Cancer Mother    Hyperlipidemia Mother    Diabetes Father    Hypertension Father    Hyperlipidemia  Sister    Hypertension Sister    Hyperlipidemia Brother    Hypertension Brother    Cancer Maternal Grandmother    Stroke Maternal Grandmother    Cancer Maternal Grandfather    Diabetes Maternal Grandfather    Heart disease Maternal Grandfather    Hypertension Maternal Grandfather    Diabetes Paternal Grandmother    Hypertension Paternal Grandmother    Cancer Paternal Grandfather    Hypertension Paternal Grandfather    Stroke Paternal Grandfather     Social History   Tobacco Use   Smoking status: Never   Smokeless tobacco: Never  Vaping Use   Vaping Use: Never used  Substance Use Topics   Alcohol use: No   Drug use: No    Prior to Admission medications   Medication Sig Start Date End Date Taking? Authorizing Provider  HYDROcodone-acetaminophen (NORCO) 5-325 MG tablet Take 1 tablet by mouth every 6 (six) hours as needed (for pain). 03/20/21  Yes Mayanna Garlitz, MD  Ascorbic Acid (VITAMIN C PO) Take by mouth.    [provider]  aspirin 81 MG EC tablet Take 1 tablet (81 mg total) by mouth daily. 12/20/19   Duke Salvia, MD  benzonatate (TESSALON) 100 MG capsule TAKE 1 CAPSULE BY MOUTH 3 TIMES DAILY AS NEEDED FOR UP TO 10 DAYS FOR COUGH. NEVER SUCK OR CHEW ON A BENZONATATE CAP 09/17/20 09/17/21  Barbette Merino, NP  carvedilol (COREG)  6.25 MG tablet Take 1 tablet (6.25 mg total) by mouth 2 (two) times daily. 11/19/20   Bensimhon, Bevelyn Buckles, MD  cholecalciferol (VITAMIN D3) 25 MCG (1000 UNIT) tablet Take 1,000 Units by mouth daily.    [provider]  dapagliflozin propanediol (FARXIGA) 10 MG TABS tablet Take 1 tablet (10 mg total) by mouth daily before breakfast. 01/21/21   Bensimhon, Bevelyn Buckles, MD  docusate sodium (COLACE) 100 MG capsule Take 1 capsule (100 mg total) by mouth 2 (two) times daily. 12/12/19   Leone Brand, NP  fluconazole (DIFLUCAN) 150 MG tablet TAKE 1 TABLET (150 MG TOTAL) BY MOUTH EVERY 36 (THIRTY-SIX) HOURS. 09/22/20 09/22/21  Ivonne Andrew, NP   Multiple Vitamins-Minerals (ZINC PO) Take by mouth.    [provider]  olopatadine (PATANOL) 0.1 % ophthalmic solution PLACE 1 DROP INTO THE RIGHT EYE 2 (TWO) TIMES DAILY. 09/17/20 09/17/21  Barbette Merino, NP  pantoprazole (PROTONIX) 40 MG tablet TAKE 1 TABLET (40 MG TOTAL) BY MOUTH DAILY. Patient taking differently: Take 40 mg by mouth daily as needed (stomach). 02/19/20 02/18/21  Nahser, Deloris Ping, MD  polyethylene glycol (MIRALAX / GLYCOLAX) 17 g packet Take 17 g by mouth daily as needed (constipation). Patient taking differently: Take 17 g by mouth daily as needed for mild constipation (constipation). 12/12/19   Leone Brand, NP  predniSONE (DELTASONE) 20 MG tablet TAKE 1 TABLET (20 MG TOTAL) BY MOUTH DAILY WITH BREAKFAST FOR 5 DAYS. 09/16/20 09/16/21  Ivonne Andrew, NP  sacubitril-valsartan (ENTRESTO) 49-51 MG Take 1 tablet by mouth 2 (two) times daily. 03/11/20   Bensimhon, Bevelyn Buckles, MD  spironolactone (ALDACTONE) 25 MG tablet Take 1 tablet (25 mg total) by mouth daily. 01/21/21   Bensimhon, Bevelyn Buckles, MD  torsemide (DEMADEX) 20 MG tablet TAKE 1 TABLET (20 MG TOTAL) BY MOUTH 2 (TWO) TIMES DAILY. 07/13/20   Bensimhon, Bevelyn Buckles, MD  TRUEplus Lancets 28G MISC 1 each by Does not apply route in the morning and at bedtime. 12/25/19   Anders Simmonds, PA-C    Allergies Hyzaar [losartan potassium-hctz]   REVIEW OF SYSTEMS  Negative except as noted here or in the History of Present Illness.   PHYSICAL EXAMINATION  Initial Vital Signs Blood pressure (!) 163/83, pulse 72, temperature 97.8 F (36.6 C), temperature source Oral, resp. rate 16, height 5\' 4"  (1.626 m), weight 112.9 kg, SpO2 100 %.  Examination General: Well-developed, high BMI female in no acute distress; appearance consistent with age of record HENT: normocephalic; atraumatic Eyes: Normal appearance Neck: supple Heart: regular rate and rhythm Lungs: clear to auscultation bilaterally Abdomen: soft; nondistended;  nontender; bowel sounds present GU: Right CVA tenderness Extremities: No deformity; full range of motion Neurologic: Awake, alert and oriented; motor function intact in all extremities and symmetric; no facial droop Skin: Warm and dry Psychiatric: Normal mood and affect   RESULTS  Summary of this visit's results, reviewed and interpreted by myself:   EKG Interpretation  Date/Time:    Ventricular Rate:    PR Interval:    QRS Duration:   QT Interval:    QTC Calculation:   R Axis:     Text Interpretation:         Laboratory Studies: Results for orders placed or performed during the hospital encounter of 03/20/21 (from the past 24 hour(s))  Urinalysis, Routine w reflex microscopic Urine, Clean Catch     Status: None   Collection Time: 03/20/21  4:54 AM  Result  Value Ref Range   Color, Urine YELLOW YELLOW   APPearance CLEAR CLEAR   Specific Gravity, Urine 1.022 1.005 - 1.030   pH 6.5 5.0 - 8.0   Glucose, UA NEGATIVE NEGATIVE mg/dL   Hgb urine dipstick NEGATIVE NEGATIVE   Bilirubin Urine NEGATIVE NEGATIVE   Ketones, ur NEGATIVE NEGATIVE mg/dL   Protein, ur NEGATIVE NEGATIVE mg/dL   Nitrite NEGATIVE NEGATIVE   Leukocytes,Ua NEGATIVE NEGATIVE  CBC with Differential/Platelet     Status: Abnormal   Collection Time: 03/20/21  4:54 AM  Result Value Ref Range   WBC 7.8 4.0 - 10.5 K/uL   RBC 4.10 3.87 - 5.11 MIL/uL   Hemoglobin 11.5 (L) 12.0 - 15.0 g/dL   HCT 02.6 37.8 - 58.8 %   MCV 88.8 80.0 - 100.0 fL   MCH 28.0 26.0 - 34.0 pg   MCHC 31.6 30.0 - 36.0 g/dL   RDW 50.2 77.4 - 12.8 %   Platelets 234 150 - 400 K/uL   nRBC 0.0 0.0 - 0.2 %   Neutrophils Relative % 45 %   Neutro Abs 3.5 1.7 - 7.7 K/uL   Lymphocytes Relative 44 %   Lymphs Abs 3.4 0.7 - 4.0 K/uL   Monocytes Relative 8 %   Monocytes Absolute 0.6 0.1 - 1.0 K/uL   Eosinophils Relative 3 %   Eosinophils Absolute 0.2 0.0 - 0.5 K/uL   Basophils Relative 0 %   Basophils Absolute 0.0 0.0 - 0.1 K/uL   Immature  Granulocytes 0 %   Abs Immature Granulocytes 0.02 0.00 - 0.07 K/uL  Basic metabolic panel     Status: Abnormal   Collection Time: 03/20/21  4:54 AM  Result Value Ref Range   Sodium 139 135 - 145 mmol/L   Potassium 4.3 3.5 - 5.1 mmol/L   Chloride 100 98 - 111 mmol/L   CO2 32 22 - 32 mmol/L   Glucose, Bld 142 (H) 70 - 99 mg/dL   BUN 17 6 - 20 mg/dL   Creatinine, Ser 7.86 0.44 - 1.00 mg/dL   Calcium 9.3 8.9 - 76.7 mg/dL   GFR, Estimated >20 >94 mL/min   Anion gap 7 5 - 15   Imaging Studies: CT Renal Stone Study  Result Date: 03/20/2021 CLINICAL DATA:  Flank pain.  Rule out kidney stone. EXAM: CT ABDOMEN AND PELVIS WITHOUT CONTRAST TECHNIQUE: Multidetector CT imaging of the abdomen and pelvis was performed following the standard protocol without IV contrast. COMPARISON:  03/17/2013 FINDINGS: Lower chest: No acute abnormality. Hepatobiliary: No focal liver abnormality is seen. No gallstones, gallbladder wall thickening, or biliary dilatation. Pancreas: Unremarkable. No pancreatic ductal dilatation or surrounding inflammatory changes. Spleen: Normal in size without focal abnormality. Adrenals/Urinary Tract: Normal adrenal glands. No kidney stones, mass or hydronephrosis identified. No ureteral calculi bladder unremarkable. Stomach/Bowel: Stomach is within normal limits. Appendix appears normal. No evidence of bowel wall thickening, distention, or inflammatory changes. Vascular/Lymphatic: Aortic atherosclerosis. No abdominopelvic adenopathy Reproductive: Uterus and bilateral adnexa are unremarkable. Other: No free fluid or fluid collections identified. Fat containing umbilical hernia. Musculoskeletal: Bilateral L5 pars defects identified. Anterolisthesis of L5 on S1 measures 7 mm. Marked degenerative disc disease noted at the L5-S1 level. IMPRESSION: 1. No acute findings within the abdomen or pelvis. No urinary tract calculi identified. 2. 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 3. Aortic atherosclerosis. Aortic Atherosclerosis (ICD10-I70.0). Electronically Signed   By: Signa Kell  M.D.   On: 03/20/2021 06:08    ED COURSE and MDM  Nursing notes, initial and subsequent vitals signs, including pulse oximetry, reviewed and interpreted by myself.  Vitals:   03/20/21 0441 03/20/21 0515 03/20/21 0600  BP: (!) 163/83 (!) 152/87 130/74  Pulse: 72 66 62  Resp: 16 17 18   Temp: 97.8 F (36.6 C)    TempSrc: Oral    SpO2: 100% 95% 99%  Weight: 112.9 kg    Height: 5\' 4"  (1.626 m)     Medications  HYDROcodone-acetaminophen (NORCO/VICODIN) 5-325 MG per tablet 1 tablet (has no administration in time range)  ondansetron (ZOFRAN) injection 4 mg (4 mg Intravenous Given 03/20/21 0452)  fentaNYL (SUBLIMAZE) injection 100 mcg (100 mcg Intravenous Given 03/20/21 0452)   6:25 AM Patient's pain significantly improved with IV fentanyl.  Patient advised of CT findings.  The patient's presentation was concerning for ureterolithiasis or pyelonephritis but her laboratory studies and CT do not support this.  Her etiology is likely musculoskeletal.  We will treat with a short course of analgesics and have her follow-up with her PCP.   PROCEDURES  Procedures   ED DIAGNOSES     ICD-10-CM   1. Right flank pain  R10.9          Nedim Oki, 03/22/21, MD 03/20/21 [phone]

## 2021-04-13 ENCOUNTER — Other Ambulatory Visit (HOSPITAL_COMMUNITY): Payer: Self-pay | Admitting: Internal Medicine

## 2021-04-13 ENCOUNTER — Other Ambulatory Visit (HOSPITAL_COMMUNITY): Payer: Self-pay

## 2021-04-13 MED FILL — Spironolactone Tab 25 MG: ORAL | 30 days supply | Qty: 30 | Fill #1 | Status: CN

## 2021-04-19 ENCOUNTER — Other Ambulatory Visit (HOSPITAL_COMMUNITY): Payer: Self-pay

## 2021-04-19 MED ORDER — TORSEMIDE 20 MG PO TABS
20.0000 mg | ORAL_TABLET | Freq: Every day | ORAL | 3 refills | Status: DC
  Filled 2021-04-19: qty 30, 30d supply, fill #0

## 2021-04-19 MED ORDER — TORSEMIDE 20 MG PO TABS
20.0000 mg | ORAL_TABLET | Freq: Every day | ORAL | 3 refills | Status: DC
  Filled 2021-04-19 – 2021-06-01 (×3): qty 30, 30d supply, fill #0
  Filled 2021-09-14: qty 30, 30d supply, fill #1
  Filled 2021-12-16: qty 30, 30d supply, fill #2
  Filled 2022-01-31: qty 30, 30d supply, fill #3

## 2021-04-19 NOTE — Addendum Note (Signed)
Addended by: Theresia Bough on: 04/19/2021 01:18 PM   Modules accepted: Orders

## 2021-04-21 ENCOUNTER — Other Ambulatory Visit (HOSPITAL_COMMUNITY): Payer: Self-pay

## 2021-06-01 ENCOUNTER — Other Ambulatory Visit (HOSPITAL_COMMUNITY): Payer: Self-pay

## 2021-06-01 MED FILL — Spironolactone Tab 25 MG: ORAL | 30 days supply | Qty: 30 | Fill #1 | Status: AC

## 2021-06-02 ENCOUNTER — Other Ambulatory Visit (HOSPITAL_COMMUNITY): Payer: Self-pay

## 2021-06-28 ENCOUNTER — Encounter (HOSPITAL_BASED_OUTPATIENT_CLINIC_OR_DEPARTMENT_OTHER): Payer: Self-pay

## 2021-06-28 ENCOUNTER — Other Ambulatory Visit: Payer: Self-pay

## 2021-06-28 DIAGNOSIS — I110 Hypertensive heart disease with heart failure: Secondary | ICD-10-CM | POA: Insufficient documentation

## 2021-06-28 DIAGNOSIS — E119 Type 2 diabetes mellitus without complications: Secondary | ICD-10-CM | POA: Insufficient documentation

## 2021-06-28 DIAGNOSIS — M79604 Pain in right leg: Secondary | ICD-10-CM | POA: Insufficient documentation

## 2021-06-28 DIAGNOSIS — I5041 Acute combined systolic (congestive) and diastolic (congestive) heart failure: Secondary | ICD-10-CM | POA: Insufficient documentation

## 2021-06-28 DIAGNOSIS — M79605 Pain in left leg: Secondary | ICD-10-CM | POA: Insufficient documentation

## 2021-06-28 NOTE — ED Triage Notes (Addendum)
Pt arrives POV with c/o bilateral leg pain that was coming and going but is now more constant.  Describes it as a cramping pain and it wakes her at night.  Reports legs have also been itching and left side pain.

## 2021-06-29 ENCOUNTER — Other Ambulatory Visit (HOSPITAL_COMMUNITY): Payer: Self-pay

## 2021-06-29 ENCOUNTER — Emergency Department (HOSPITAL_BASED_OUTPATIENT_CLINIC_OR_DEPARTMENT_OTHER)
Admission: EM | Admit: 2021-06-29 | Discharge: 2021-06-29 | Disposition: A | Discharge status: Home / Self Care | Payer: Self-pay | Attending: Emergency Medicine | Admitting: Emergency Medicine

## 2021-06-29 DIAGNOSIS — M79604 Pain in right leg: Secondary | ICD-10-CM

## 2021-06-29 MED ORDER — MOISTURIZING CREAM EX CREA
TOPICAL_CREAM | CUTANEOUS | 1 refills | Status: DC
  Filled 2021-06-29: qty 453, fill #0

## 2021-06-29 NOTE — ED Provider Notes (Signed)
[facility] Emergency Department Provider Note MRN:  [id number]  Arrival date & time: 06/29/21     Chief Complaint   Leg Pain   History of Present Illness   Maria Duncan is a 55 y.o. year-old female with a history of diabetes, CHF presenting to the ED with chief complaint of leg pain.  Patient explains that she had COVID-19 1 year ago and was in the ICU, on the ventilator, discharged after 17 days.  She has had a long recovery.  Since that time she has experienced pain in the bilateral legs.  She has an itchy burning sensation at times but she also has a cramping sensation at times.  Seems to be worsening in becoming more constant over the past few days.  Worse at night, improved when she gets up and ambulates or rubs them down.  Denies any chest pain or shortness of breath, no abdominal pain, no recent fever or cough, no other complaints.  Review of Systems  A complete 10 system review of systems was obtained and all systems are negative except as noted in the HPI and PMH.   Patient's Health History    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) (HCC)    Chest pain of uncertain etiology, non obstructive CAD, pain due to acute HF 12/12/2019   CHF (congestive heart failure) (HCC)    Diabetes mellitus without complication (HCC)    Hypertension    Hypoventilation associated with obesity (HCC) 12/12/2019   Morbid obesity (HCC)    NICM (nonischemic cardiomyopathy) (HCC)    OSA (obstructive sleep apnea) 12/12/2019   Pulmonary hypertension, unspecified (HCC) 12/12/2019    Past Surgical History:  Procedure Laterality Date   ABDOMINAL HYSTERECTOMY     RIGHT/LEFT HEART CATH AND CORONARY ANGIOGRAPHY N/A 12/09/2019   Procedure: RIGHT/LEFT HEART CATH AND CORONARY ANGIOGRAPHY;  Surgeon: Dolores Patty, MD;  Location: [facility];  Service: Cardiovascular;  Laterality: N/A;    Family History  Problem  Relation Age of Onset   Cancer Mother    Hyperlipidemia Mother    Diabetes Father    Hypertension Father    Hyperlipidemia Sister    Hypertension Sister    Hyperlipidemia Brother    Hypertension Brother    Cancer Maternal Grandmother    Stroke Maternal Grandmother    Cancer Maternal Grandfather    Diabetes Maternal Grandfather    Heart disease Maternal Grandfather    Hypertension Maternal Grandfather    Diabetes Paternal Grandmother    Hypertension Paternal Grandmother    Cancer Paternal Grandfather    Hypertension Paternal Grandfather    Stroke Paternal Grandfather     Social History   Socioeconomic History   Marital status: Married    Spouse name: Not on file   Number of children: Not on file   Years of education: Not on file   Highest education level: Not on file  Occupational History   Not on file  Tobacco Use   Smoking status: Never   Smokeless tobacco: Never  Vaping Use   Vaping Use: Never used  Substance and Sexual Activity   Alcohol use: No   Drug use: No   Sexual activity: Yes  Other Topics Concern   Not on file  Social History Narrative   Not on file   Social Determinants of Health   Financial Resource Strain: Not on file  Food Insecurity: Not on file  Transportation  Needs: Not on file  Physical Activity: Not on file  Stress: Not on file  Social Connections: Not on file  Intimate Partner Violence: Not on file     Physical Exam   Vitals:   06/28/21 2151 06/29/21 0017  BP: (!) 133/92 (!) 150/89  Pulse: (!) 101 81  Resp: 16 16  Temp: 98.1 F (36.7 C)   SpO2: 98% 96%    CONSTITUTIONAL: Well-appearing, NAD NEURO:  Alert and oriented x 3, no focal deficits EYES:  eyes equal and reactive ENT/NECK:  no LAD, no JVD CARDIO: Regular rate, well-perfused, normal S1 and S2 PULM:  CTAB no wheezing or rhonchi GI/GU:  normal bowel sounds, non-distended, non-tender MSK/SPINE:  No gross deformities, no edema SKIN:  no rash, atraumatic PSYCH:   Appropriate speech and behavior  *Additional and/or pertinent findings included in MDM below  Diagnostic and Interventional Summary    EKG Interpretation  Date/Time:  Monday June 28 2021 21:56:59 EST Ventricular Rate:  92 PR Interval:  174 QRS Duration: 84 QT Interval:  358 QTC Calculation: 442 R Axis:   71 Text Interpretation: Normal sinus rhythm Normal ECG No significant change since last tracing Confirmed by Susy Frizzle [phone]) on 06/28/2021 9:59:08 PM       Labs Reviewed - No data to display  US Venous Img Lower Bilateral (DVT)    (Results Pending)    Medications - No data to display   Procedures  /  Critical Care Procedures  ED Course and Medical Decision Making  I have reviewed the triage vital signs, the nursing notes, and pertinent available records from the EMR.  Listed above are laboratory and imaging tests that I personally ordered, reviewed, and interpreted and then considered in my medical decision making (see below for details).  Generally normal-appearing legs, normal vital signs, overall doubt emergent process given the duration of symptoms.  Low concern for DVT but this is patient's primary concern and so we will order outpatient ultrasound.  Patient has some dry skin on the legs which may contribute to the burning pain.  Overall her description seems most consistent with restless leg syndrome given that it is worse at night and improved with some mobility.  Advised PCP follow-up, exercise.       Elmer Sow. Pilar Plate, MD [facility] [email or web address]  Final Clinical Impressions(s) / ED Diagnoses     ICD-10-CM   1. Pain in both lower extremities  M79.604    M79.605       ED Discharge Orders          Ordered    US Venous Img Lower Bilateral (DVT)        06/29/21 0129    Emollient (MOISTURIZING CREME) CREA        06/29/21 0131             Discharge Instructions Discussed with and Provided  to Patient:    Discharge Instructions      You were evaluated in the Emergency Department and after careful evaluation, we did not find any emergent condition requiring admission or further testing in the hospital.  Your exam/testing today was overall reassuring.  Symptoms seem best explained by restless leg syndrome.  Recommend exercising the legs during the day as we discussed.  Use the moisturizer prescribed for your itchy legs.  Talk to your primary care doctor about your symptoms.  Please return to the Emergency Department if you experience any worsening of your condition.  Thank you for allowing [place] to be a part of your care.        Sabas Sous, MD 06/29/21 [phone]

## 2021-06-29 NOTE — Discharge Instructions (Addendum)
You were evaluated in the Emergency Department and after careful evaluation, we did not find any emergent condition requiring admission or further testing in the hospital.  Your exam/testing today was overall reassuring.  Symptoms seem best explained by restless leg syndrome.  Recommend exercising the legs during the day as we discussed.  Use the moisturizer prescribed for your itchy legs.  Talk to your primary care doctor about your symptoms.  Please return to the Emergency Department if you experience any worsening of your condition.  Thank you for allowing [place] to be a part of your care.

## 2021-06-30 ENCOUNTER — Emergency Department (HOSPITAL_BASED_OUTPATIENT_CLINIC_OR_DEPARTMENT_OTHER): Payer: Self-pay

## 2021-06-30 ENCOUNTER — Emergency Department (HOSPITAL_BASED_OUTPATIENT_CLINIC_OR_DEPARTMENT_OTHER)
Admission: EM | Admit: 2021-06-30 | Discharge: 2021-06-30 | Disposition: A | Discharge status: Home / Self Care | Payer: Self-pay | Attending: Emergency Medicine | Admitting: Emergency Medicine

## 2021-06-30 ENCOUNTER — Emergency Department (HOSPITAL_BASED_OUTPATIENT_CLINIC_OR_DEPARTMENT_OTHER): Payer: Self-pay | Admitting: Radiology

## 2021-06-30 ENCOUNTER — Other Ambulatory Visit: Payer: Self-pay

## 2021-06-30 ENCOUNTER — Encounter (HOSPITAL_BASED_OUTPATIENT_CLINIC_OR_DEPARTMENT_OTHER): Payer: Self-pay

## 2021-06-30 DIAGNOSIS — I2510 Atherosclerotic heart disease of native coronary artery without angina pectoris: Secondary | ICD-10-CM | POA: Insufficient documentation

## 2021-06-30 DIAGNOSIS — I110 Hypertensive heart disease with heart failure: Secondary | ICD-10-CM | POA: Insufficient documentation

## 2021-06-30 DIAGNOSIS — Z7982 Long term (current) use of aspirin: Secondary | ICD-10-CM | POA: Insufficient documentation

## 2021-06-30 DIAGNOSIS — R252 Cramp and spasm: Secondary | ICD-10-CM | POA: Insufficient documentation

## 2021-06-30 DIAGNOSIS — I5043 Acute on chronic combined systolic (congestive) and diastolic (congestive) heart failure: Secondary | ICD-10-CM | POA: Insufficient documentation

## 2021-06-30 DIAGNOSIS — M79605 Pain in left leg: Secondary | ICD-10-CM | POA: Insufficient documentation

## 2021-06-30 DIAGNOSIS — M79604 Pain in right leg: Secondary | ICD-10-CM | POA: Insufficient documentation

## 2021-06-30 DIAGNOSIS — Z79899 Other long term (current) drug therapy: Secondary | ICD-10-CM | POA: Insufficient documentation

## 2021-06-30 DIAGNOSIS — E119 Type 2 diabetes mellitus without complications: Secondary | ICD-10-CM | POA: Insufficient documentation

## 2021-06-30 DIAGNOSIS — Z7984 Long term (current) use of oral hypoglycemic drugs: Secondary | ICD-10-CM | POA: Insufficient documentation

## 2021-06-30 DIAGNOSIS — Z8616 Personal history of COVID-19: Secondary | ICD-10-CM | POA: Insufficient documentation

## 2021-06-30 DIAGNOSIS — M545 Low back pain, unspecified: Secondary | ICD-10-CM | POA: Insufficient documentation

## 2021-06-30 MED ORDER — KETOROLAC TROMETHAMINE 15 MG/ML IJ SOLN
15.0000 mg | Freq: Once | INTRAMUSCULAR | Status: AC
  Administered 2021-06-30: 15 mg via INTRAMUSCULAR
  Filled 2021-06-30: qty 1

## 2021-06-30 MED ORDER — ACETAMINOPHEN 500 MG PO TABS
1000.0000 mg | ORAL_TABLET | Freq: Once | ORAL | Status: AC
  Administered 2021-06-30: 1000 mg via ORAL
  Filled 2021-06-30: qty 2

## 2021-06-30 MED ORDER — DIAZEPAM 5 MG PO TABS
5.0000 mg | ORAL_TABLET | Freq: Once | ORAL | Status: AC
  Administered 2021-06-30: 5 mg via ORAL
  Filled 2021-06-30: qty 1

## 2021-06-30 MED ORDER — OXYCODONE HCL 5 MG PO TABS
5.0000 mg | ORAL_TABLET | Freq: Once | ORAL | Status: AC
  Administered 2021-06-30: 5 mg via ORAL
  Filled 2021-06-30: qty 1

## 2021-06-30 NOTE — ED Notes (Signed)
ED Provider at bedside. 

## 2021-06-30 NOTE — ED Triage Notes (Signed)
Here for leg pain that has spread to lower back.  Difficulty with walking.  States was here yesterday for same

## 2021-06-30 NOTE — ED Notes (Signed)
Patient was placed on 4 L [place] per O2 sat of 66-71% on RA. Patient received pain medication and has a history of OSA on CPAP at home. Patient O2 currently 100%. RT will continue to monitor patient and O2 needs during encounter. RN and MD aware.

## 2021-06-30 NOTE — Discharge Instructions (Signed)

## 2021-06-30 NOTE — ED Notes (Signed)
Patient ambulatory at discharge  

## 2021-06-30 NOTE — ED Provider Notes (Signed)
[facility] EMERGENCY DEPT Provider Note   CSN: [id number] Arrival date & time: 06/30/21  4235     History Chief Complaint  Patient presents with   Back Pain    Maria Duncan is a 55 y.o. female.  55 yo F with a chief complaints of low back pain.  The patient feels like her back is locked up.  She has done this in the past to her.  Usually she gets an injection and things tend to get better over time.  She denies any trauma.  She did go for a walk and did some squats and some toe touches about a week ago.  She was actually seen about an hour or so ago for leg pain feels like it comes and goes seems to be diffusely about the lower legs.  She is concerned it might be a DVT and was told to come back this morning for repeat ultrasound.  She felt since her back was much worse she would prefer to be seen back in the emergency department.  She denies loss of bowel or bladder denies loss of rectal sensation denies numbness or weakness to the legs.  She feels like her legs tingle off and on.  The leg symptoms have been ongoing.  The history is provided by the patient.  Back Pain Location:  Lumbar spine Quality:  Cramping and stiffness Stiffness is present:  In the morning Pain severity:  Moderate Onset quality:  Gradual Duration:  1 day Timing:  Constant Progression:  Worsening Chronicity:  Recurrent Relieved by:  Nothing Worsened by:  Palpation, touching and twisting Ineffective treatments:  None tried Associated symptoms: no chest pain, no dysuria, no fever and no headaches       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) (HCC)    Chest pain of uncertain etiology, non obstructive CAD, pain due to acute HF 12/12/2019   CHF (congestive heart failure) (HCC)    Diabetes mellitus without complication (HCC)    Hypertension    Hypoventilation associated with obesity (HCC) 12/12/2019   Morbid obesity (HCC)    NICM  (nonischemic cardiomyopathy) (HCC)    OSA (obstructive sleep apnea) 12/12/2019   Pulmonary hypertension, unspecified (HCC) 12/12/2019    Patient Active Problem List   Diagnosis Date Noted   Vitamin D deficiency 08/27/2020   Hyperglycemia 08/07/2020   Pneumonia due to COVID-19 virus 07/28/2020   Acute hypoxemic respiratory failure due to COVID-19 (HCC) 07/27/2020   Combined congestive systolic and diastolic heart failure (HCC) 07/27/2020   Acute on chronic combined systolic and diastolic CHF (congestive heart failure) (HCC) 01/24/2020   Chest pain of uncertain etiology, non obstructive CAD, pain due to acute HF 12/12/2019   Pulmonary hypertension, unspecified (HCC) 12/12/2019   OSA (obstructive sleep apnea) 12/12/2019   Hypoventilation associated with obesity (HCC) 12/12/2019   CAD in native artery non obstructive on cath 12/09/19 12/12/2019   NICM (nonischemic cardiomyopathy) (HCC)    Acute combined systolic and diastolic HF (heart failure) (HCC)    Morbid obesity (HCC)    CHF exacerbation (HCC) 12/07/2019   Cardiomegaly 12/07/2019   Essential hypertension 12/07/2019   CHF (congestive heart failure) (HCC) 12/07/2019    Past Surgical History:  Procedure Laterality Date   ABDOMINAL HYSTERECTOMY     RIGHT/LEFT HEART CATH AND CORONARY ANGIOGRAPHY N/A 12/09/2019   Procedure: RIGHT/LEFT HEART CATH AND CORONARY ANGIOGRAPHY;  Surgeon: Dolores Patty, MD;  Location: [facility] INVASIVE  CV LAB;  Service: Cardiovascular;  Laterality: N/A;     OB History   No obstetric history on file.     Family History  Problem Relation Age of Onset   Cancer Mother    Hyperlipidemia Mother    Diabetes Father    Hypertension Father    Hyperlipidemia Sister    Hypertension Sister    Hyperlipidemia Brother    Hypertension Brother    Cancer Maternal Grandmother    Stroke Maternal Grandmother    Cancer Maternal Grandfather    Diabetes Maternal Grandfather    Heart disease Maternal Grandfather     Hypertension Maternal Grandfather    Diabetes Paternal Grandmother    Hypertension Paternal Grandmother    Cancer Paternal Grandfather    Hypertension Paternal Grandfather    Stroke Paternal Grandfather     Social History   Tobacco Use   Smoking status: Never   Smokeless tobacco: Never  Vaping Use   Vaping Use: Never used  Substance Use Topics   Alcohol use: No   Drug use: No    Home Medications Prior to Admission medications   Medication Sig Start Date End Date Taking? Authorizing Provider  Ascorbic Acid (VITAMIN C PO) Take by mouth.    [provider]  aspirin 81 MG EC tablet Take 1 tablet (81 mg total) by mouth daily. 12/20/19   Deboraha Sprang, MD  benzonatate (TESSALON) 100 MG capsule TAKE 1 CAPSULE BY MOUTH 3 TIMES DAILY AS NEEDED FOR UP TO 10 DAYS FOR COUGH. NEVER SUCK OR CHEW ON A BENZONATATE CAP 09/17/20 09/17/21  Vevelyn Francois, NP  carvedilol (COREG) 6.25 MG tablet Take 1 tablet (6.25 mg total) by mouth 2 (two) times daily. 11/19/20   Bensimhon, Shaune Pascal, MD  cholecalciferol (VITAMIN D3) 25 MCG (1000 UNIT) tablet Take 1,000 Units by mouth daily.    [provider]  dapagliflozin propanediol (FARXIGA) 10 MG TABS tablet Take 1 tablet (10 mg total) by mouth daily before breakfast. 01/21/21   Bensimhon, Shaune Pascal, MD  docusate sodium (COLACE) 100 MG capsule Take 1 capsule (100 mg total) by mouth 2 (two) times daily. 12/12/19   Isaiah Serge, NP  Emollient (MOISTURIZING CREME) CREA Apply to dry skin daily. 06/29/21   Maudie Flakes, MD  fluconazole (DIFLUCAN) 150 MG tablet TAKE 1 TABLET (150 MG TOTAL) BY MOUTH EVERY 36 (THIRTY-SIX) HOURS. 09/22/20 09/22/21  Fenton Foy, NP  HYDROcodone-acetaminophen (NORCO) 5-325 MG tablet Take 1 tablet by mouth every 6 (six) hours as needed (for pain). 03/20/21   Molpus, John, MD  HYDROcodone-acetaminophen (NORCO) 5-325 MG tablet Take 1 tablet by mouth every 6 (six) hours as needed (for pain). 03/20/21   Molpus, John, MD   Multiple Vitamins-Minerals (ZINC PO) Take by mouth.    [provider]  olopatadine (PATANOL) 0.1 % ophthalmic solution PLACE 1 DROP INTO THE RIGHT EYE 2 (TWO) TIMES DAILY. 09/17/20 09/17/21  Vevelyn Francois, NP  pantoprazole (PROTONIX) 40 MG tablet TAKE 1 TABLET (40 MG TOTAL) BY MOUTH DAILY. Patient taking differently: Take 40 mg by mouth daily as needed (stomach). 02/19/20 02/18/21  Nahser, Wonda Cheng, MD  polyethylene glycol (MIRALAX / GLYCOLAX) 17 g packet Take 17 g by mouth daily as needed (constipation). Patient taking differently: Take 17 g by mouth daily as needed for mild constipation (constipation). 12/12/19   Isaiah Serge, NP  predniSONE (DELTASONE) 20 MG tablet TAKE 1 TABLET (20 MG TOTAL) BY MOUTH DAILY WITH BREAKFAST FOR  5 DAYS. 09/16/20 09/16/21  Fenton Foy, NP  sacubitril-valsartan (ENTRESTO) 49-51 MG Take 1 tablet by mouth 2 (two) times daily. 03/11/20   Bensimhon, Shaune Pascal, MD  spironolactone (ALDACTONE) 25 MG tablet Take 1 tablet (25 mg total) by mouth daily. 01/21/21   Bensimhon, Shaune Pascal, MD  torsemide (DEMADEX) 20 MG tablet Take 1 tablet (20 mg total) by mouth daily. 04/19/21   Larey Dresser, MD  TRUEplus Lancets 28G MISC 1 each by Does not apply route in the morning and at bedtime. 12/25/19   Argentina Donovan, PA-C    Allergies    Hyzaar [losartan potassium-hctz]  Review of Systems   Review of Systems  Constitutional:  Negative for chills and fever.  HENT:  Negative for congestion and rhinorrhea.   Eyes:  Negative for redness and visual disturbance.  Respiratory:  Negative for shortness of breath and wheezing.   Cardiovascular:  Negative for chest pain and palpitations.  Gastrointestinal:  Negative for nausea and vomiting.  Genitourinary:  Negative for dysuria and urgency.  Musculoskeletal:  Positive for arthralgias and back pain. Negative for myalgias.  Skin:  Negative for pallor and wound.  Neurological:  Negative for dizziness and headaches.   Physical  Exam Updated Vital Signs BP 116/60 (BP Location: Right Arm)   Pulse 76   Temp 98.6 F (37 C) (Oral)   Resp 18   SpO2 100%   Physical Exam Vitals and nursing note reviewed.  Constitutional:      General: She is not in acute distress.    Appearance: She is well-developed. She is not diaphoretic.     Comments: BMI 40  HENT:     Head: Normocephalic and atraumatic.  Eyes:     Pupils: Pupils are equal, round, and reactive to light.  Cardiovascular:     Rate and Rhythm: Normal rate and regular rhythm.     Heart sounds: No murmur heard.   No friction rub. No gallop.  Pulmonary:     Effort: Pulmonary effort is normal.     Breath sounds: No wheezing or rales.  Abdominal:     General: There is no distension.     Palpations: Abdomen is soft.     Tenderness: There is no abdominal tenderness.  Musculoskeletal:        General: No tenderness.     Cervical back: Normal range of motion and neck supple.     Comments: Pulse motor and sensation to bilateral lower extremities.  No clonus reflexes are 2+ and equal negative Babinski negative straight leg raise test bilaterally.  Skin:    General: Skin is warm and dry.  Neurological:     Mental Status: She is alert and oriented to person, place, and time.  Psychiatric:        Behavior: Behavior normal.    ED Results / Procedures / Treatments   Labs (all labs ordered are listed, but only abnormal results are displayed) Labs Reviewed - No data to display  EKG None  Radiology DG Lumbar Spine Complete  Result Date: 06/30/2021 CLINICAL DATA:  Low back pain EXAM: LUMBAR SPINE - COMPLETE 4+ VIEW COMPARISON:  02/07/2011 FINDINGS: Bilateral L5 pars defects with for 10 mm of anterolisthesis of L5 on S1. Degenerative disc and facet disease. Findings stable since prior study. No fracture. SI joints symmetric and unremarkable. 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 Findings stable since prior  study. No acute bony abnormality. Electronically Signed  By: Rolm Baptise M.D.   On: 06/30/2021 08:21   US Venous Img Lower Bilateral  Result Date: 06/30/2021 CLINICAL DATA:  Bilateral lower extremity pain for 1 week EXAM: BILATERAL LOWER EXTREMITY VENOUS DOPPLER ULTRASOUND TECHNIQUE: Gray-scale sonography with graded compression, as well as color Doppler and duplex ultrasound were performed to evaluate the lower extremity deep venous systems from the level of the common femoral vein and including the common femoral, femoral, profunda femoral, popliteal and calf veins including the posterior tibial, peroneal and gastrocnemius veins when visible. The superficial great saphenous vein was also interrogated. Spectral Doppler was utilized to evaluate flow at rest and with distal augmentation maneuvers in the common femoral, femoral and popliteal veins. COMPARISON:  None. FINDINGS: RIGHT LOWER EXTREMITY Common Femoral Vein: No evidence of thrombus. Normal compressibility, respiratory phasicity and response to augmentation. Saphenofemoral Junction: No evidence of thrombus. Normal compressibility and flow on color Doppler imaging. Profunda Femoral Vein: No evidence of thrombus. Normal compressibility and flow on color Doppler imaging. Femoral Vein: No evidence of thrombus. Normal compressibility, respiratory phasicity and response to augmentation. Popliteal Vein: No evidence of thrombus. Normal compressibility, respiratory phasicity and response to augmentation. Calf Veins: No evidence of thrombus. Normal compressibility and flow on color Doppler imaging. Superficial Great Saphenous Vein: No evidence of thrombus. Normal compressibility. LEFT LOWER EXTREMITY Common Femoral Vein: No evidence of thrombus. Normal compressibility, respiratory phasicity and response to augmentation. Saphenofemoral Junction: No evidence of thrombus. Normal compressibility and flow on color Doppler imaging. Profunda Femoral Vein: No evidence  of thrombus. Normal compressibility and flow on color Doppler imaging. Femoral Vein: No evidence of thrombus. Normal compressibility, respiratory phasicity and response to augmentation. Popliteal Vein: No evidence of thrombus. Normal compressibility, respiratory phasicity and response to augmentation. Calf Veins: No evidence of thrombus. Normal compressibility and flow on color Doppler imaging. Superficial Great Saphenous Vein: No evidence of thrombus. Normal compressibility. IMPRESSION: No evidence of deep venous thrombosis in either lower extremity. Electronically Signed   By: Jerilynn Mages.  Shick M.D.   On: 06/30/2021 08:58    Procedures Procedures   Medications Ordered in ED Medications  acetaminophen (TYLENOL) tablet 1,000 mg (1,000 mg Oral Given 06/30/21 0744)  oxyCODONE (Oxy IR/ROXICODONE) immediate release tablet 5 mg (5 mg Oral Given 06/30/21 0744)  diazepam (VALIUM) tablet 5 mg (5 mg Oral Given 06/30/21 0745)  ketorolac (TORADOL) 15 MG/ML injection 15 mg (15 mg Intramuscular Given 06/30/21 0746)    ED Course  I have reviewed the triage vital signs and the nursing notes.  Pertinent labs & imaging results that were available during my care of the patient were reviewed by me and considered in my medical decision making (see chart for details).    MDM Rules/Calculators/A&P                           55 yo F with a chief complaints of bilateral leg pain that has been ongoing and she was seen yesterday and she was concerned for possible DVT.  Plan for her to return this morning for ultrasound through the radiology department however she felt her back seized up on her overnight.  Seems independent from the leg discomfort.  She has no other red flags.  No trauma no loss of bowel or bladder no fevers no urinary symptoms.  We will obtain a screening plain film of the low back.  Treat her pain here.  Bilateral DVT studies as planned previously.  Plain film of  the L-spine viewed by me without obvious  fracture.  DVT study is negative.  In my attempt to control the patient's pain I had given her little bit too much and she has become hypoxic.  Improving with oxygen.  A bit more alert now will trial without.  Patient feeling better.  D/c home.   9:59 AM:  I have discussed the diagnosis/risks/treatment options with the patient and believe the pt to be eligible for discharge home to follow-up with PCP. We also discussed returning to the ED immediately if new or worsening sx occur. We discussed the sx which are most concerning (e.g., sudden worsening pain, fever, inability to tolerate by mouth) that necessitate immediate return. Medications administered to the patient during their visit and any new prescriptions provided to the patient are listed below.  Medications given during this visit Medications  acetaminophen (TYLENOL) tablet 1,000 mg (1,000 mg Oral Given 06/30/21 0744)  oxyCODONE (Oxy IR/ROXICODONE) immediate release tablet 5 mg (5 mg Oral Given 06/30/21 0744)  diazepam (VALIUM) tablet 5 mg (5 mg Oral Given 06/30/21 0745)  ketorolac (TORADOL) 15 MG/ML injection 15 mg (15 mg Intramuscular Given 06/30/21 0746)     The patient appears reasonably screen and/or stabilized for discharge and I doubt any other medical condition or other [facility] requiring further screening, evaluation, or treatment in the ED at this time prior to discharge.   Final Clinical Impression(s) / ED Diagnoses Final diagnoses:  Acute bilateral low back pain without sciatica  Leg cramps    Rx / DC Orders ED Discharge Orders     None        Deno Etienne, DO 06/30/21 [phone]

## 2021-08-18 ENCOUNTER — Ambulatory Visit: Payer: Self-pay | Admitting: Podiatry

## 2021-08-25 ENCOUNTER — Ambulatory Visit: Payer: Self-pay | Admitting: Podiatry

## 2021-08-26 ENCOUNTER — Encounter (HOSPITAL_COMMUNITY): Payer: Self-pay

## 2021-08-27 ENCOUNTER — Ambulatory Visit: Payer: Self-pay | Admitting: Nurse Practitioner

## 2021-08-30 NOTE — Progress Notes (Addendum)
ADVANCED HF CLINIC NOTE   PCP: Vevelyn Francois, NP HF Cardiologist: Dr Haroldine Laws  HPI: Maria Duncan is a 56 y.o. with a history of HTN, obesity, and NICM/systolic heart failure with recovered EF.   Admitted 5/21 with new onset HF. Echo EF 35-40%. RV moderately HK.  R/LHC with minimal CAD, elevated filling pressures and preserved cardiac output. Diuresed with IV lasix and transitioned to torsemide 20 mg daily. Also concern for sleep apnea.   Echo 05/11/20: EF 55% RV ok   Admitted 1/22 with respiratory failure due to COVID PNA. Was in hospital for 17 days and discharged on O2. Seen in AHF clinic 2/22 and doing well. Echo (4/22) showed EF stable 55-60%, mild LVH, grade I DD, RV ok. She was then graduated from [facility] clinic and referred to [facility] Dr. Lucretia Field.  Today she returns for an acute visit with complaints of fatigue and weight loss. She stopped all her meds x 4 days a couple weeks ago, but is now back on them. Blood sugars have been elevated recently. Wearing oxygen at night (post COVID), but not wearing her CPAP. She can walk up stairs and on flat ground without dyspnea. She had chest palpitations/fluttering a couple weeks ago. Denies CP, dizziness, edema, or PND/Orthopnea. Appetite ok but weight trending down. No fever or chills. Weight at home 205 pounds and trending down, unintentional. Taking all medications. Taking torsemide once a week if legs swell some. She feels she may have a yeast infection/irritation but has had symptoms x 1 year since hospitalization. Also with complaints of increased thirst and urination. Denies ETOH/tobacco/drug use.  Cardiac Studies: - Echo (4/22): EF stable 55-60%, mild LVH, grade I DD, RV ok.  - Echo (8/21): EF 55%, RV ok.  - R/LHC 5/21 Ao = 131/86 (109) LV = 138/29 RA =   9 RV = 58/15 PA = 63/23 (38) PCW = 21 Fick cardiac output/index = 5.2/2.4 PVR = 3.2 WU FA sat = 93% PA sat = 62%, 67%  ABG: 7.27/88/81/93%  Assessment: 1. 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 Severe NICM EF 25% suspect due to HTN and undiagnosed OSA 3. Elevated filling pressures with normal cardiac output 4. OHS/OSA with CO2 retention (patient received 2mg  versed and 50mcg fentanyl during cath)  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) (HCC)    Chest pain of uncertain etiology, non obstructive CAD, pain due to acute HF 12/12/2019   CHF (congestive heart failure) ([place])    Diabetes mellitus without complication ([place])    Hypertension    Hypoventilation associated with obesity ([place]) 12/12/2019   Morbid obesity ([place])    NICM (nonischemic cardiomyopathy) ([place])    OSA (obstructive sleep apnea) 12/12/2019   Pulmonary hypertension, unspecified ([place]) 12/12/2019   Current Outpatient Medications  Medication Sig Dispense Refill   aspirin 81 MG EC tablet Take 1 tablet (81 mg total) by mouth daily. 90 tablet 3   benzonatate (TESSALON) 100 MG capsule TAKE 1 CAPSULE BY MOUTH 3 TIMES DAILY AS NEEDED FOR UP TO 10 DAYS FOR COUGH. NEVER SUCK OR CHEW ON A BENZONATATE CAP 30 capsule 0   carvedilol (COREG) 6.25 MG tablet Take 1 tablet (6.25 mg total) by mouth 2 (two) times daily. 60 tablet 11   cholecalciferol (VITAMIN D3) 25 MCG (1000 UNIT) tablet Take 1,000 Units by mouth daily.     dapagliflozin propanediol (FARXIGA) 10 MG TABS tablet Take 1 tablet (10 mg total) by  mouth daily before breakfast. 30 tablet 5   docusate sodium (COLACE) 100 MG capsule Take 1 capsule (100 mg total) by mouth 2 (two) times daily. 10 capsule 0   Emollient (MOISTURIZING CREME) CREA Apply to dry skin daily. (Patient taking differently: Apply to dry skin daily as needed) 453 g 1   Multiple Vitamins-Minerals (ZINC PO) Take by mouth.     olopatadine (PATANOL) 0.1 % ophthalmic solution PLACE 1 DROP INTO THE RIGHT EYE 2 (TWO) TIMES DAILY. 5 mL 12   pantoprazole (PROTONIX) 40 MG tablet TAKE 1 TABLET (40 MG TOTAL) BY MOUTH DAILY. (Patient taking differently:  Take 40 mg by mouth daily as needed (stomach).) 30 tablet 11   polyethylene glycol (MIRALAX / GLYCOLAX) 17 g packet Take 17 g by mouth daily as needed (constipation). 14 each 0   sacubitril-valsartan (ENTRESTO) 49-51 MG Take 1 tablet by mouth 2 (two) times daily. 180 tablet 3   spironolactone (ALDACTONE) 25 MG tablet Take 1 tablet (25 mg total) by mouth daily. 30 tablet 5   torsemide (DEMADEX) 20 MG tablet Take 1 tablet (20 mg total) by mouth daily. 30 tablet 3   TRUEplus Lancets 28G MISC 1 each by Does not apply route in the morning and at bedtime. 100 each 1   Ascorbic Acid (VITAMIN C PO) Take by mouth. (Patient not taking: Reported on 09/01/2021)     No current facility-administered medications for this encounter.   Allergies  Allergen Reactions   Hyzaar [Losartan Potassium-Hctz] Nausea And Vomiting and Cough    Patient can take Entresto now   Social History   Socioeconomic History   Marital status: Married    Spouse name: Not on file   Number of children: Not on file   Years of education: Not on file   Highest education level: Not on file  Occupational History   Not on file  Tobacco Use   Smoking status: Never   Smokeless tobacco: Never  Vaping Use   Vaping Use: Never used  Substance and Sexual Activity   Alcohol use: No   Drug use: No   Sexual activity: Yes  Other Topics Concern   Not on file  Social History Narrative   Not on file   Social Determinants of Health   Financial Resource Strain: Not on file  Food Insecurity: Not on file  Transportation Needs: Not on file  Physical Activity: Not on file  Stress: Not on file  Social Connections: Not on file  Intimate Partner Violence: Not on file   Family History  Problem Relation Age of Onset   Cancer Mother    Hyperlipidemia Mother    Diabetes Father    Hypertension Father    Hyperlipidemia Sister    Hypertension Sister    Hyperlipidemia Brother    Hypertension Brother    Cancer Maternal Grandmother     Stroke Maternal Grandmother    Cancer Maternal Grandfather    Diabetes Maternal Grandfather    Heart disease Maternal Grandfather    Hypertension Maternal Grandfather    Diabetes Paternal Grandmother    Hypertension Paternal Grandmother    Cancer Paternal Grandfather    Hypertension Paternal Grandfather    Stroke Paternal Grandfather    BP 104/78    Pulse 98    Wt 95.4 kg (210 lb 6.4 oz)    SpO2 98%    BMI 36.12 kg/m   Wt Readings from Last 3 Encounters:  09/01/21 95.4 kg (210 lb 6.4 oz)  06/28/21 103.4  kg (228 lb)  03/20/21 112.9 kg (249 lb)   PHYSICAL EXAM: General:  NAD. No resp difficulty, walked into clinic. HEENT: Normal Neck: Supple. No JVD. Carotids 2+ bilat; no bruits. No lymphadenopathy or thryomegaly appreciated. Cor: PMI nondisplaced. Regular rate & rhythm. No rubs, gallops or murmurs. Lungs: Clear Abdomen: Obese, nontender, nondistended. No hepatosplenomegaly. No bruits or masses. Good bowel sounds. Extremities: No cyanosis, clubbing, rash, edema Neuro: Alert & oriented x 3, cranial nerves grossly intact. Moves all 4 extremities w/o difficulty. Affect pleasant.  ECG (personally reviewed): NSR 88 bpm  ReDs: 32%  ASSESSMENT & PLAN: Fatigue  - Likely multifactorial. - Check iron studies, TSH, CMET, A1C and CBC today. - Needs to wear her CPAP, see below. - She has PCP follow up later today.  2. Chronic Systolic Heart Failure, NICM (likely hypertensive) - Echo 11/2019 EF 35-40% RV moderately reduced.  - Cath 11/2019 with minimal cardiac disease and preserved cardiac output. EF on cath 25%.  - Echo 05/11/20: EF 55% RV ok Personally reviewed - Echo 4/22 EF 55-60%, mild LVH, grade I DD, RV ok - NYHA II. Volume status ok, uses torsemide 20 daily PRN. I suspect her current symptoms are not due to worsening HF. - Continue carvedilol 6.25 mg bid.  - Continue Entresto 49/51 mg bid. - Continue spiro 25 mg daily. - Continue Farxiga 10 mg daily. - Labs today.  3. HTN -  Stable. - Continue current regimen.  4. Obesity  Body mass index is 36.12 kg/m. - Continue weight loss efforts.   5. Suspected Sleep Apnea  - Needs to wear CPAP, advised follow up with Dr. Radford Pax soon.  6. DMII - reporting blood sugars >500 recently, along with increase thirst and urination. - Likely contributing to her current symptoms. - Check A1c - Followed by PCP. - On Farxiga.   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 - Remains fatigued.  8. Vaginitis - She is unsure if she has a yeast infection vs irritation. Has had symptoms since hospitalization > 1 year ago when she had a purewick in place. - No discharge, low back pain or dysuria. - May need to stop [place].  - I asked her to address this with her PCP at visit later today.  Follow up with APP in 2 weeks. If stable at that time, will graduate HF clinic and refer to  [place] general cardiology, Dr. Lynnda Shields.  Rafael Bihari FNP 9:09 AM

## 2021-09-01 ENCOUNTER — Encounter: Payer: Self-pay | Admitting: Nurse Practitioner

## 2021-09-01 ENCOUNTER — Ambulatory Visit (HOSPITAL_COMMUNITY)
Admission: RE | Admit: 2021-09-01 | Discharge: 2021-09-01 | Disposition: A | Discharge status: Home / Self Care | Payer: Self-pay | Source: Ambulatory Visit | Attending: Family Medicine | Admitting: Family Medicine

## 2021-09-01 ENCOUNTER — Encounter (HOSPITAL_COMMUNITY): Payer: Self-pay

## 2021-09-01 ENCOUNTER — Ambulatory Visit (INDEPENDENT_AMBULATORY_CARE_PROVIDER_SITE_OTHER): Payer: Self-pay | Admitting: Nurse Practitioner

## 2021-09-01 ENCOUNTER — Other Ambulatory Visit (HOSPITAL_COMMUNITY): Payer: Self-pay

## 2021-09-01 ENCOUNTER — Ambulatory Visit: Payer: Self-pay | Admitting: Nurse Practitioner

## 2021-09-01 ENCOUNTER — Other Ambulatory Visit: Payer: Self-pay

## 2021-09-01 ENCOUNTER — Telehealth (HOSPITAL_COMMUNITY): Payer: Self-pay | Admitting: Cardiology

## 2021-09-01 VITALS — BP 91/66 | HR 67 | Temp 97.9°F | Ht 64.5 in | Wt 209.4 lb

## 2021-09-01 VITALS — BP 104/78 | HR 98 | Wt 210.4 lb

## 2021-09-01 DIAGNOSIS — E119 Type 2 diabetes mellitus without complications: Secondary | ICD-10-CM

## 2021-09-01 DIAGNOSIS — I5042 Chronic combined systolic (congestive) and diastolic (congestive) heart failure: Secondary | ICD-10-CM

## 2021-09-01 DIAGNOSIS — E1122 Type 2 diabetes mellitus with diabetic chronic kidney disease: Secondary | ICD-10-CM | POA: Insufficient documentation

## 2021-09-01 DIAGNOSIS — E1165 Type 2 diabetes mellitus with hyperglycemia: Secondary | ICD-10-CM

## 2021-09-01 DIAGNOSIS — Z7984 Long term (current) use of oral hypoglycemic drugs: Secondary | ICD-10-CM | POA: Insufficient documentation

## 2021-09-01 DIAGNOSIS — N760 Acute vaginitis: Secondary | ICD-10-CM | POA: Insufficient documentation

## 2021-09-01 DIAGNOSIS — G4733 Obstructive sleep apnea (adult) (pediatric): Secondary | ICD-10-CM

## 2021-09-01 DIAGNOSIS — Z6836 Body mass index (BMI) 36.0-36.9, adult: Secondary | ICD-10-CM | POA: Insufficient documentation

## 2021-09-01 DIAGNOSIS — I5022 Chronic systolic (congestive) heart failure: Secondary | ICD-10-CM | POA: Insufficient documentation

## 2021-09-01 DIAGNOSIS — U099 Post covid-19 condition, unspecified: Secondary | ICD-10-CM

## 2021-09-01 DIAGNOSIS — R5383 Other fatigue: Secondary | ICD-10-CM

## 2021-09-01 DIAGNOSIS — Z9981 Dependence on supplemental oxygen: Secondary | ICD-10-CM | POA: Insufficient documentation

## 2021-09-01 DIAGNOSIS — I110 Hypertensive heart disease with heart failure: Secondary | ICD-10-CM | POA: Insufficient documentation

## 2021-09-01 DIAGNOSIS — N898 Other specified noninflammatory disorders of vagina: Secondary | ICD-10-CM

## 2021-09-01 DIAGNOSIS — N761 Subacute and chronic vaginitis: Secondary | ICD-10-CM

## 2021-09-01 DIAGNOSIS — I2510 Atherosclerotic heart disease of native coronary artery without angina pectoris: Secondary | ICD-10-CM | POA: Insufficient documentation

## 2021-09-01 DIAGNOSIS — I10 Essential (primary) hypertension: Secondary | ICD-10-CM

## 2021-09-01 DIAGNOSIS — Z79899 Other long term (current) drug therapy: Secondary | ICD-10-CM | POA: Insufficient documentation

## 2021-09-01 LAB — COMPREHENSIVE METABOLIC PANEL
ALT: 23 U/L (ref 0–44)
AST: 19 U/L (ref 15–41)
Albumin: 3.7 g/dL (ref 3.5–5.0)
Alkaline Phosphatase: 91 U/L (ref 38–126)
Anion gap: 13 (ref 5–15)
BUN: 10 mg/dL (ref 6–20)
CO2: 32 mmol/L (ref 22–32)
Calcium: 9.8 mg/dL (ref 8.9–10.3)
Chloride: 87 mmol/L — ABNORMAL LOW (ref 98–111)
Creatinine, Ser: 1.13 mg/dL — ABNORMAL HIGH (ref 0.44–1.00)
GFR, Estimated: 57 mL/min — ABNORMAL LOW (ref >60)
Glucose, Bld: 661 mg/dL (ref 70–99)
Potassium: 4.3 mmol/L (ref 3.5–5.1)
Sodium: 132 mmol/L — ABNORMAL LOW (ref 135–145)
Total Bilirubin: 0.9 mg/dL (ref 0.3–1.2)
Total Protein: 7.3 g/dL (ref 6.5–8.1)

## 2021-09-01 LAB — CBC
HCT: 38.2 % (ref 36.0–46.0)
Hemoglobin: 13 g/dL (ref 12.0–15.0)
MCH: 29.2 pg (ref 26.0–34.0)
MCHC: 34 g/dL (ref 30.0–36.0)
MCV: 85.8 fL (ref 80.0–100.0)
Platelets: 228 10*3/uL (ref 150–400)
RBC: 4.45 MIL/uL (ref 3.87–5.11)
RDW: 11.7 % (ref 11.5–15.5)
WBC: 6.1 10*3/uL (ref 4.0–10.5)
nRBC: 0 % (ref 0.0–0.2)

## 2021-09-01 LAB — LIPID PANEL
Cholesterol: 357 mg/dL — ABNORMAL HIGH (ref 0–200)
HDL: 52 mg/dL (ref >40)
LDL Cholesterol: 275 mg/dL — ABNORMAL HIGH (ref 0–99)
Total CHOL/HDL Ratio: 6.9 RATIO
Triglycerides: 148 mg/dL (ref <150)
VLDL: 30 mg/dL (ref 0–40)

## 2021-09-01 LAB — POCT URINALYSIS DIP (CLINITEK)
Bilirubin, UA: NEGATIVE
Blood, UA: NEGATIVE
Glucose, UA: 500 mg/dL — AB
Ketones, POC UA: NEGATIVE mg/dL
Leukocytes, UA: NEGATIVE
Nitrite, UA: NEGATIVE
POC PROTEIN,UA: NEGATIVE
Spec Grav, UA: 1.01 (ref 1.010–1.025)
Urobilinogen, UA: 0.2 E.U./dL
pH, UA: 6.5 (ref 5.0–8.0)

## 2021-09-01 LAB — IRON AND TIBC
Iron: 96 ug/dL (ref 28–170)
Saturation Ratios: 23 % (ref 10.4–31.8)
TIBC: 416 ug/dL (ref 250–450)
UIBC: 320 ug/dL

## 2021-09-01 LAB — TSH: TSH: 3.632 u[IU]/mL (ref 0.350–4.500)

## 2021-09-01 LAB — FERRITIN: Ferritin: 221 ng/mL (ref 11–307)

## 2021-09-01 MED ORDER — GLIPIZIDE 10 MG PO TABS
10.0000 mg | ORAL_TABLET | Freq: Two times a day (BID) | ORAL | 3 refills | Status: DC
  Filled 2021-09-01: qty 60, 30d supply, fill #0
  Filled 2021-10-13: qty 60, 30d supply, fill #1
  Filled 2021-12-16: qty 60, 30d supply, fill #2
  Filled 2022-03-04: qty 60, 30d supply, fill #3

## 2021-09-01 MED ORDER — ATORVASTATIN CALCIUM 80 MG PO TABS
80.0000 mg | ORAL_TABLET | Freq: Every day | ORAL | 3 refills | Status: DC
  Filled 2021-09-01: qty 30, 30d supply, fill #0
  Filled 2021-10-13: qty 30, 30d supply, fill #1
  Filled 2021-11-18: qty 30, 30d supply, fill #2
  Filled 2021-12-16: qty 30, 30d supply, fill #3
  Filled 2022-01-31: qty 30, 30d supply, fill #4
  Filled 2022-03-04: qty 30, 30d supply, fill #5
  Filled 2022-04-06 – 2022-04-13 (×2): qty 30, 30d supply, fill #6
  Filled 2022-05-25: qty 30, 30d supply, fill #7
  Filled 2022-07-01: qty 30, 30d supply, fill #8
  Filled 2022-08-24: qty 30, 30d supply, fill #9

## 2021-09-01 NOTE — Addendum Note (Signed)
Encounter addended by: Jacklynn Ganong, FNP on: 09/01/2021 5:09 PM  Actions taken: Clinical Note Signed

## 2021-09-01 NOTE — Telephone Encounter (Signed)
Lmom  Phone:  [phone] Judie Petit)

## 2021-09-01 NOTE — Progress Notes (Signed)
[facility] Patient [facility] [address] [place], [place]  [address] Phone:  [phone]   Fax:  [phone]   Established Patient Office Visit  Subjective:  Patient ID: Maria Duncan, female    DOB: Oct 27, 1965  Age: 56 y.o. MRN: [id number]  CC:  Chief Complaint  Patient presents with   Office Visit    Pt stated she had blood work done today at Cardiology. Pt has vaginal itching and hemorrhoids     HPI Maria Duncan presents for follow up.  She has been lost to follow-up.  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   She has long COVID. She continues to use home O2. She reports that she continues to use oxygen nightly.  She has sleep apnea and was just getting started with this when she was diagnosed with COVID.  She does not have a CPAP at home.  She is self-pay and was and was connected with a company that was completed that was providing her with the needed equipment.  She is unaware of the name..  She is following up today due to polydipsia, polyuria, and persistent weight loss.  She was seen today by cardiology and her blood sugars were greater than 600.  She is currently on Farxiga 10 mg.  She was advised to go to the ED however came to her follow-up appointment here.  She has no desire to start insulin therapy.  She is however willing to start therapy.  She has declined metformin.  She is concerned about her overall health and diabetes at this time.  She has had some recent deaths within her circle which has helped her  to realize how important her healthy.  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) (HCC)    Chest pain of uncertain etiology, non obstructive CAD, pain due to acute HF 12/12/2019   CHF (congestive heart failure) (HCC)    Diabetes mellitus without complication (HCC)    Hypertension    Hypoventilation associated with obesity (HCC) 12/12/2019   Morbid obesity (HCC)    NICM (nonischemic cardiomyopathy) (HCC)    OSA (obstructive sleep apnea) 12/12/2019   Pulmonary hypertension, unspecified (HCC) 12/12/2019    Past Surgical History:  Procedure Laterality Date   ABDOMINAL HYSTERECTOMY     RIGHT/LEFT HEART CATH AND CORONARY ANGIOGRAPHY N/A 12/09/2019   Procedure: RIGHT/LEFT HEART CATH AND CORONARY ANGIOGRAPHY;  Surgeon: Dolores Patty, MD;  Location: [facility]VAS[facility];  Service: Cardiovascular;  Laterality: N/A;    Family History  Problem Relation Age of Onset   Cancer Mother    Hyperlipidemia Mother    Diabetes Father    Hypertension Father    Hyperlipidemia Sister    Hypertension Sister    Hyperlipidemia Brother    Hypertension Brother    Cancer Maternal Grandmother    Stroke Maternal Grandmother    Cancer Maternal Grandfather    Diabetes Maternal Grandfather    Heart disease Maternal Grandfather    Hypertension Maternal Grandfather    Diabetes Paternal Grandmother    Hypertension Paternal Grandmother    Cancer  Paternal Grandfather    Hypertension Paternal Grandfather    Stroke Paternal Grandfather     Social History   Socioeconomic History   Marital status: Married    Spouse name: Not on file   Number of children: Not on file   Years of education: Not on file   Highest education level: Not on file  Occupational History   Not on file  Tobacco Use   Smoking status: Never   Smokeless tobacco: Never  Vaping Use   Vaping Use: Never used  Substance and Sexual Activity   Alcohol use: No   Drug use: No   Sexual  activity: Yes  Other Topics Concern   Not on file  Social History Narrative   Not on file   Social Determinants of Health   Financial Resource Strain: Not on file  Food Insecurity: Not on file  Transportation Needs: Not on file  Physical Activity: Not on file  Stress: Not on file  Social Connections: Not on file  Intimate Partner Violence: Not on file    Outpatient Medications Prior to Visit  Medication Sig Dispense Refill   Ascorbic Acid (VITAMIN C PO) Take by mouth.     aspirin 81 MG EC tablet Take 1 tablet (81 mg total) by mouth daily. 90 tablet 3   benzonatate (TESSALON) 100 MG capsule TAKE 1 CAPSULE BY MOUTH 3 TIMES DAILY AS NEEDED FOR UP TO 10 DAYS FOR COUGH. NEVER SUCK OR CHEW ON A BENZONATATE CAP 30 capsule 0   carvedilol (COREG) 6.25 MG tablet Take 1 tablet (6.25 mg total) by mouth 2 (two) times daily. 60 tablet 11   cholecalciferol (VITAMIN D3) 25 MCG (1000 UNIT) tablet Take 1,000 Units by mouth daily.     dapagliflozin propanediol (FARXIGA) 10 MG TABS tablet Take 1 tablet (10 mg total) by mouth daily before breakfast. 30 tablet 5   docusate sodium (COLACE) 100 MG capsule Take 1 capsule (100 mg total) by mouth 2 (two) times daily. 10 capsule 0   Multiple Vitamins-Minerals (ZINC PO) Take by mouth.     olopatadine (PATANOL) 0.1 % ophthalmic solution PLACE 1 DROP INTO THE RIGHT EYE 2 (TWO) TIMES DAILY. 5 mL 12   polyethylene glycol (MIRALAX / GLYCOLAX) 17 g packet Take 17 g by mouth daily as needed (constipation). 14 each 0   sacubitril-valsartan (ENTRESTO) 49-51 MG Take 1 tablet by mouth 2 (two) times daily. 180 tablet 3   spironolactone (ALDACTONE) 25 MG tablet Take 1 tablet (25 mg total) by mouth daily. 30 tablet 5   torsemide (DEMADEX) 20 MG tablet Take 1 tablet (20 mg total) by mouth daily. 30 tablet 3   TRUEplus Lancets 28G MISC 1 each by Does not apply route in the morning and at bedtime. 100 each 1   Emollient (MOISTURIZING CREME) CREA Apply to dry skin daily. (Patient  not taking: Reported on 09/01/2021) 453 g 1   pantoprazole (PROTONIX) 40 MG tablet TAKE 1 TABLET (40 MG TOTAL) BY MOUTH DAILY. (Patient taking differently: Take 40 mg by mouth daily as needed (stomach).) 30 tablet 11   No facility-administered medications prior to visit.    Allergies  Allergen Reactions   Hyzaar [Losartan Potassium-Hctz] Nausea And Vomiting and Cough    Patient can take Entresto now    ROS Review of Systems    Objective:    Physical Exam Constitutional:      Appearance: She is obese.  HENT:     Head: Normocephalic and atraumatic.  Nose: Nose normal.     Mouth/Throat:     Mouth: Mucous membranes are moist.  Cardiovascular:     Rate and Rhythm: Normal rate and regular rhythm.     Pulses: Normal pulses.     Heart sounds: Normal heart sounds.  Pulmonary:     Effort: Pulmonary effort is normal.     Breath sounds: Normal breath sounds.  Abdominal:     Palpations: Abdomen is soft.  Musculoskeletal:     Right lower leg: No edema.     Left lower leg: No edema.  Skin:    General: Skin is warm.     Capillary Refill: Capillary refill takes less than 2 seconds.  Neurological:     General: No focal deficit present.     Mental Status: She is alert and oriented to person, place, and time.  Psychiatric:        Mood and Affect: Mood normal.        Behavior: Behavior normal.    BP 91/66    Pulse 67    Temp 97.9 F (36.6 C)    Ht 5' 4.5" (1.638 m)    Wt 209 lb 6 oz (95 kg)    SpO2 98%    BMI 35.38 kg/m  Wt Readings from Last 3 Encounters:  09/01/21 209 lb 6 oz (95 kg)  09/01/21 210 lb 6.4 oz (95.4 kg)  06/28/21 228 lb (103.4 kg)     There are no preventive care reminders to display for this patient.   There are no preventive care reminders to display for this patient.  Lab Results  Component Value Date   TSH 3.632 09/01/2021   Lab Results  Component Value Date   WBC 6.1 09/01/2021   HGB 13.0 09/01/2021   HCT 38.2 09/01/2021   MCV 85.8 09/01/2021    PLT 228 09/01/2021   Lab Results  Component Value Date   NA 132 (L) 09/01/2021   K 4.3 09/01/2021   CO2 32 09/01/2021   GLUCOSE 661 (HH) 09/01/2021   BUN 10 09/01/2021   CREATININE 1.13 (H) 09/01/2021   BILITOT 0.9 09/01/2021   ALKPHOS 91 09/01/2021   AST 19 09/01/2021   ALT 23 09/01/2021   PROT 7.3 09/01/2021   ALBUMIN 3.7 09/01/2021   CALCIUM 9.8 09/01/2021   ANIONGAP 13 09/01/2021   Lab Results  Component Value Date   CHOL 357 (H) 09/01/2021   Lab Results  Component Value Date   HDL 52 09/01/2021   Lab Results  Component Value Date   LDLCALC 275 (H) 09/01/2021   Lab Results  Component Value Date   TRIG 148 09/01/2021   Lab Results  Component Value Date   CHOLHDL 6.9 09/01/2021   Lab Results  Component Value Date   HGBA1C 7.5 (H) 07/28/2020      Assessment & Plan:   Problem List Items Addressed This Visit       Cardiovascular and Mediastinum   CHF (congestive heart failure) (HCC) Persistent Currently being followed by cardiology     Respiratory   OSA (obstructive sleep apnea) Persistent Requesting assistance with CPAP Split sleep study pending   Relevant Orders   Split night study   Other Visit Diagnoses     Type 2 diabetes mellitus with hyperglycemia, unspecified whether long term insulin use (HCC)    -  Primary Uncontrolled A1c high Encourage compliance with current treatment regimen  Dose adjustment declined insulin therapy and metformin agrees to glipizide started at 10  mg twice daily 4-week follow-up Encourage regular CBG monitoring Encourage contacting office if excessive hyperglycemia and or hypoglycemia Lifestyle modification with healthy diet (fewer calories, more high fiber foods, whole grains and non-starchy vegetables, lower fat meat and fish, low-fat diary include healthy oils) regular exercise (physical activity) and weight loss Opthalmology exam discussed  Nutritional consult recommended Regular dental visits  encouraged Home BP monitoring also encouraged goal <130/80    Relevant Medications   glipiZIDE (GLUCOTROL) 10 MG tablet   Other Relevant Orders   Ambulatory referral to diabetic education   Vaginal irritation       Relevant Orders   NuSwab Vaginitis (VG)       Meds ordered this encounter  Medications   glipiZIDE (GLUCOTROL) 10 MG tablet    Sig: Take 1 tablet (10 mg total) by mouth 2 (two) times daily before a meal.    Dispense:  60 tablet    Refill:  3    Order Specific Question:   Supervising Provider    Answer:   Quentin Angst [[id number]]    Follow-up: Return in about 4 weeks (around 09/29/2021) for follow up [id number].    Barbette Merino, NP

## 2021-09-01 NOTE — Addendum Note (Signed)
Encounter addended by: Linda Hedges, RN on: 09/01/2021 3:26 PM  Actions taken: Pharmacy for encounter modified

## 2021-09-01 NOTE — Telephone Encounter (Addendum)
Spoke with pt. PCP started her on DM medication   ----- Message from Jacklynn Ganong, FNP sent at 09/01/2021 10:37 AM EST ----- Glucose critically elevated. She needs to go to the ED.  LDL elevated. With her DM2, she needs to be on statin therapy. Start atorva 80 mg daily, if she is agreeable.

## 2021-09-01 NOTE — Patient Instructions (Addendum)
It was great to see you today! No medication changes are needed at this time.  Labs today We will only contact you if something comes back abnormal or we need to make some changes. Otherwise no news is good news!  Your physician recommends that you schedule a follow-up appointment in: 2 weeks  in the [facility]    Please be sure to call Dr Radford Pax for a follow up regarding your CPAP [phone]   Your physician has requested that you regularly monitor and record your blood pressure readings at home. Please use the same machine at the same time of day to check your readings and record them to bring to your follow-up visit. -BRING YOUR LOG TO THE NEXT APPOINTMENT  Do the following things EVERYDAY: Weigh yourself in the morning before breakfast. Write it down and keep it in a log. Take your medicines as prescribed Eat low salt foods--Limit salt (sodium) to 2000 mg per day.  Stay as active as you can everyday Limit all fluids for the day to less than 2 liters  At the [facility], you and your health needs are our priority. As part of our continuing mission to provide you with exceptional heart care, we have created designated Provider Care Teams. These Care Teams include your primary Cardiologist (physician) and Advanced Practice Providers (APPs- Physician Assistants and Nurse Practitioners) who all work together to provide you with the care you need, when you need it.   You may see any of the following providers on your designated Care Team at your next follow up: Dr Glori Bickers Dr Haynes Kerns, NP Lyda Jester, [place] [facility] [place], [place] Audry Riles, PharmD   Please be sure to bring in all your medications bottles to every appointment.   If you have any questions or concerns before your next appointment please send [place] a message through [place] or call our office at [phone].    TO LEAVE A MESSAGE  FOR THE NURSE SELECT OPTION 2, PLEASE LEAVE A MESSAGE INCLUDING: YOUR NAME DATE OF BIRTH CALL BACK NUMBER REASON FOR CALL**this is important as we prioritize the call backs  YOU WILL RECEIVE A CALL BACK THE SAME DAY AS LONG AS YOU CALL BEFORE 4:00 PM

## 2021-09-01 NOTE — Telephone Encounter (Signed)
-----   Message from Jacklynn Ganong, [place] sent at 09/01/2021 10:37 AM EST ----- Glucose critically elevated. She needs to go to the ED.  LDL elevated. With her DM2, she needs to be on statin therapy. Start atorva 80 mg daily, if she is agreeable.

## 2021-09-01 NOTE — Progress Notes (Signed)
ReDS Vest / Clip - 09/01/21 0800       ReDS Vest / Clip   Station Marker B    Ruler Value 31.5    ReDS Value Range Low volume    ReDS Actual Value 32

## 2021-09-01 NOTE — Patient Instructions (Addendum)
Diabetes Mellitus Basics Diabetes mellitus, or diabetes, is a long-term (chronic) disease. It occurs when the body does not properly use sugar (glucose) that is released from food after you eat. Diabetes mellitus may be caused by one or both of these problems: Your pancreas does not make enough of a hormone called insulin. Your body does not react in a normal way to the insulin that it makes. Insulin lets glucose enter cells in your body. This gives you energy. If you have diabetes, glucose cannot get into cells. This causes high blood glucose (hyperglycemia). How to treat and manage diabetes You may need to take insulin or other diabetes medicines daily to keep your glucose in balance. If you are prescribed insulin, you will learn how to give yourself insulin by injection. You may need to adjust the amount of insulin you take based on the foods that you eat. You will need to check your blood glucose levels using a glucose monitor as told by your health care provider. The readings can help determine if you have low or high blood glucose. Generally, you should have these blood glucose levels: Before meals (preprandial): 80-130 mg/dL (4.4-7.2 mmol/L). After meals (postprandial): below 180 mg/dL (10 mmol/L). Hemoglobin A1c (HbA1c) level: less than 7%. Your health care provider will set treatment goals for you. Keep all follow-up visits. This is important. Follow these instructions at home: Diabetes medicines Take your diabetes medicines every day as told by your health care provider. List your diabetes medicines here: Name of medicine: ______________________________ Amount (dose): _______________ Time (a.m./p.m.): _______________ Notes: ___________________________________ Name of medicine: ______________________________ Amount (dose): _______________ Time (a.m./p.m.): _______________ Notes: ___________________________________ Name of medicine: ______________________________ Amount (dose):  _______________ Time (a.m./p.m.): _______________ Notes: ___________________________________ Insulin If you use insulin, list the types of insulin you use here: Insulin type: ______________________________ Amount (dose): _______________ Time (a.m./p.m.): _______________Notes: ___________________________________ Insulin type: ______________________________ Amount (dose): _______________ Time (a.m./p.m.): _______________ Notes: ___________________________________ Insulin type: ______________________________ Amount (dose): _______________ Time (a.m./p.m.): _______________ Notes: ___________________________________ Insulin type: ______________________________ Amount (dose): _______________ Time (a.m./p.m.): _______________ Notes: ___________________________________ Insulin type: ______________________________ Amount (dose): _______________ Time (a.m./p.m.): _______________ Notes: ___________________________________ Managing blood glucose Check your blood glucose levels using a glucose monitor as told by your health care provider. Write down the times that you check your glucose levels here: Time: _______________ Notes: ___________________________________ Time: _______________ Notes: ___________________________________ Time: _______________ Notes: ___________________________________ Time: _______________ Notes: ___________________________________ Time: _______________ Notes: ___________________________________ Time: _______________ Notes: ___________________________________  Low blood glucose Low blood glucose (hypoglycemia) is when glucose is at or below 70 mg/dL (3.9 mmol/L). Symptoms may include: Feeling: Hungry. Sweaty and clammy. Irritable or easily upset. Dizzy. Sleepy. Having: A fast heartbeat. A headache. A change in your vision. Numbness around the mouth, lips, or tongue. Having trouble with: Moving (coordination). Sleeping. Treating low blood glucose To treat low blood  glucose, eat or drink something containing sugar right away. If you can think clearly and swallow safely, follow the 15:15 rule: Take 15 grams of a fast-acting carb (carbohydrate), as told by your health care provider. Some fast-acting carbs are: Glucose tablets: take 3-4 tablets. Hard candy: eat 3-5 pieces. Fruit juice: drink 4 oz (120 mL). Regular (not diet) soda: drink 4-6 oz (120-180 mL). Honey or sugar: eat 1 Tbsp (15 mL). Check your blood glucose levels 15 minutes after you take the carb. If your glucose is still at or below 70 mg/dL (3.9 mmol/L), take 15 grams of a carb again. If your glucose does not go above 70 mg/dL (3.9 mmol/L) after 3 tries,  get help right away. After your glucose goes back to normal, eat a meal or a snack within 1 hour. Treating very low blood glucose If your glucose is at or below 54 mg/dL (3 mmol/L), you have very low blood glucose (severe hypoglycemia). This is an emergency. Do not wait to see if the symptoms will go away. Get medical help right away. Call your local emergency services (911 in the [place] Do not drive yourself to the hospital. Questions to ask your health care provider Should I talk with a diabetes educator? What equipment will I need to care for myself at home? What diabetes medicines do I need? When should I take them? How often do I need to check my blood glucose levels? What number can I call if I have questions? When is my follow-up visit? Where can I find a support group for people with diabetes? Where to find more information American Diabetes Association: www.diabetes.org Association of Diabetes Care and Education Specialists: www.diabeteseducator.org Contact a health care provider if: Your blood glucose is at or above 240 mg/dL (42.6 mmol/L) for 2 days in a row. You have been sick or have had a fever for 2 days or more, and you are not getting better. You have any of these problems for more than 6 hours: You cannot eat or  drink. You feel nauseous. You vomit. You have diarrhea. Get help right away if: Your blood glucose is lower than 54 mg/dL (3 mmol/L). You get confused. You have trouble thinking clearly. You have trouble breathing. These symptoms may represent a serious problem that is an emergency. Do not wait to see if the symptoms will go away. Get medical help right away. Call your local emergency services (911 in the [place] Do not drive yourself to the hospital. Summary Diabetes mellitus is a chronic disease that occurs when the body does not properly use sugar (glucose) that is released from food after you eat. Take insulin and diabetes medicines as told. Check your blood glucose every day, as often as told. Keep all follow-up visits. This is important. This information is not intended to replace advice given to you by your health care provider. Make sure you discuss any questions you have with your health care provider. Document Revised: 11/12/2019 Document Reviewed: 11/12/2019 Elsevier Patient Education  2022 Elsevier Inc. Diabetes Mellitus and Nutrition, Adult When you have diabetes, or diabetes mellitus, it is very important to have healthy eating habits because your blood sugar (glucose) levels are greatly affected by what you eat and drink. Eating healthy foods in the right amounts, at about the same times every day, can help you: Manage your blood glucose. Lower your risk of heart disease. Improve your blood pressure. Reach or maintain a healthy weight. What can affect my meal plan? Every person with diabetes is different, and each person has different needs for a meal plan. Your health care provider may recommend that you work with a dietitian to make a meal plan that is best for you. Your meal plan may vary depending on factors such as: The calories you need. The medicines you take. Your weight. Your blood glucose, blood pressure, and cholesterol levels. Your activity level. Other  health conditions you have, such as heart or kidney disease. How do carbohydrates affect me? Carbohydrates, also called carbs, affect your blood glucose level more than any other type of food. Eating carbs raises the amount of glucose in your blood. It is important to know how many carbs you can  safely have in each meal. This is different for every person. Your dietitian can help you calculate how many carbs you should have at each meal and for each snack. How does alcohol affect me? Alcohol can cause a decrease in blood glucose (hypoglycemia), especially if you use insulin or take certain diabetes medicines by mouth. Hypoglycemia can be a life-threatening condition. Symptoms of hypoglycemia, such as sleepiness, dizziness, and confusion, are similar to symptoms of having too much alcohol. Do not drink alcohol if: Your health care provider tells you not to drink. You are pregnant, may be pregnant, or are planning to become pregnant. If you drink alcohol: Limit how much you have to: 0-1 drink a day for women. 0-2 drinks a day for men. Know how much alcohol is in your drink. In the [place] one drink equals one 12 oz bottle of beer (355 mL), one 5 oz glass of wine (148 mL), or one 1 oz glass of hard liquor (44 mL). Keep yourself hydrated with water, diet soda, or unsweetened iced tea. Keep in mind that regular soda, juice, and other mixers may contain a lot of sugar and must be counted as carbs. What are tips for following this plan? Reading food labels Start by checking the serving size on the Nutrition Facts label of packaged foods and drinks. The number of calories and the amount of carbs, fats, and other nutrients listed on the label are based on one serving of the item. Many items contain more than one serving per package. Check the total grams (g) of carbs in one serving. Check the number of grams of saturated fats and trans fats in one serving. Choose foods that have a low amount or none of  these fats. Check the number of milligrams (mg) of salt (sodium) in one serving. Most people should limit total sodium intake to less than 2,300 mg per day. Always check the nutrition information of foods labeled as "low-fat" or "nonfat." These foods may be higher in added sugar or refined carbs and should be avoided. Talk to your dietitian to identify your daily goals for nutrients listed on the label. Shopping Avoid buying canned, pre-made, or processed foods. These foods tend to be high in fat, sodium, and added sugar. Shop around the outside edge of the grocery store. This is where you will most often find fresh fruits and vegetables, bulk grains, fresh meats, and fresh dairy products. Cooking Use low-heat cooking methods, such as baking, instead of high-heat cooking methods, such as deep frying. Cook using healthy oils, such as olive, canola, or sunflower oil. Avoid cooking with butter, cream, or high-fat meats. Meal planning Eat meals and snacks regularly, preferably at the same times every day. Avoid going long periods of time without eating. Eat foods that are high in fiber, such as fresh fruits, vegetables, beans, and whole grains. Eat 4-6 oz (112-168 g) of lean protein each day, such as lean meat, chicken, fish, eggs, or tofu. One ounce (oz) (28 g) of lean protein is equal to: 1 oz (28 g) of meat, chicken, or fish. 1 egg.  cup (62 g) of tofu. Eat some foods each day that contain healthy fats, such as avocado, nuts, seeds, and fish. What foods should I eat? Fruits Berries. Apples. Oranges. Peaches. Apricots. Plums. Grapes. Mangoes. Papayas. Pomegranates. Kiwi. Cherries. Vegetables Leafy greens, including lettuce, spinach, kale, chard, collard greens, mustard greens, and cabbage. Beets. Cauliflower. Broccoli. Carrots. Green beans. Tomatoes. Peppers. Onions. Cucumbers. Brussels sprouts. Grains Whole grains, such  as whole-wheat or whole-grain bread, crackers, tortillas, cereal, and  pasta. Unsweetened oatmeal. Quinoa. Brown or wild rice. Meats and other proteins Seafood. Poultry without skin. Lean cuts of poultry and beef. Tofu. Nuts. Seeds. Dairy Low-fat or fat-free dairy products such as milk, yogurt, and cheese. The items listed above may not be a complete list of foods and beverages you can eat and drink. Contact a dietitian for more information. What foods should I avoid? Fruits Fruits canned with syrup. Vegetables Canned vegetables. Frozen vegetables with butter or cream sauce. Grains Refined white flour and flour products such as bread, pasta, snack foods, and cereals. Avoid all processed foods. Meats and other proteins Fatty cuts of meat. Poultry with skin. Breaded or fried meats. Processed meat. Avoid saturated fats. Dairy Full-fat yogurt, cheese, or milk. Beverages Sweetened drinks, such as soda or iced tea. The items listed above may not be a complete list of foods and beverages you should avoid. Contact a dietitian for more information. Questions to ask a health care provider Do I need to meet with a certified diabetes care and education specialist? Do I need to meet with a dietitian? What number can I call if I have questions? When are the best times to check my blood glucose? Where to find more information: American Diabetes Association: diabetes.org Academy of Nutrition and Dietetics: eatright.[employer] of Diabetes and Digestive and Kidney Diseases: StageSync.si Association of Diabetes Care & Education Specialists: diabeteseducator.org Summary It is important to have healthy eating habits because your blood sugar (glucose) levels are greatly affected by what you eat and drink. It is important to use alcohol carefully. A healthy meal plan will help you manage your blood glucose and lower your risk of heart disease. Your health care provider may recommend that you work with a dietitian to make a meal plan that is best for you. This  information is not intended to replace advice given to you by your health care provider. Make sure you discuss any questions you have with your health care provider. Document Revised: 02/12/2020 Document Reviewed: 02/12/2020 Elsevier Patient Education  2022 [employer].

## 2021-09-02 LAB — HEMOGLOBIN A1C
Hgb A1c MFr Bld: >15.5 % — ABNORMAL HIGH (ref 4.8–5.6)
Mean Plasma Glucose: >398 mg/dL

## 2021-09-04 LAB — NUSWAB VAGINITIS (VG)
Candida albicans, NAA: POSITIVE — AB
Candida glabrata, NAA: NEGATIVE
Trich vag by NAA: NEGATIVE

## 2021-09-06 ENCOUNTER — Other Ambulatory Visit: Payer: Self-pay

## 2021-09-06 ENCOUNTER — Encounter: Payer: Self-pay | Attending: Nurse Practitioner | Admitting: Dietician

## 2021-09-06 ENCOUNTER — Encounter: Payer: Self-pay | Admitting: Dietician

## 2021-09-06 ENCOUNTER — Other Ambulatory Visit: Payer: Self-pay | Admitting: Nurse Practitioner

## 2021-09-06 DIAGNOSIS — I110 Hypertensive heart disease with heart failure: Secondary | ICD-10-CM | POA: Insufficient documentation

## 2021-09-06 DIAGNOSIS — Z8616 Personal history of COVID-19: Secondary | ICD-10-CM | POA: Insufficient documentation

## 2021-09-06 DIAGNOSIS — E118 Type 2 diabetes mellitus with unspecified complications: Secondary | ICD-10-CM

## 2021-09-06 DIAGNOSIS — Z713 Dietary counseling and surveillance: Secondary | ICD-10-CM | POA: Insufficient documentation

## 2021-09-06 DIAGNOSIS — I509 Heart failure, unspecified: Secondary | ICD-10-CM | POA: Insufficient documentation

## 2021-09-06 DIAGNOSIS — G4733 Obstructive sleep apnea (adult) (pediatric): Secondary | ICD-10-CM | POA: Insufficient documentation

## 2021-09-06 DIAGNOSIS — E1165 Type 2 diabetes mellitus with hyperglycemia: Secondary | ICD-10-CM | POA: Insufficient documentation

## 2021-09-06 MED ORDER — FLUCONAZOLE 150 MG PO TABS
150.0000 mg | ORAL_TABLET | Freq: Every day | ORAL | 0 refills | Status: DC
  Filled 2021-09-06: qty 1, 1d supply, fill #0

## 2021-09-06 NOTE — Progress Notes (Signed)
MyChart message sent to the patient. However additional follow up maybe needed. Thanks

## 2021-09-06 NOTE — Progress Notes (Signed)
Diabetes Self-Management Education  Visit Type: First/Initial  Appt. Start Time: [id number] (late) Appt. End Time: 1035  09/06/2021  [name], identified by name and date of birth, is a 56 y.o. female with a diagnosis of Diabetes: Type 2.   ASSESSMENT Patient is here today alone. She would like to learn how to better control her blood sugar. She states that she has a crack on the bottom of her foot.   She currently has a yeast infection. Concerns about insulin production.  Tests such as GAD and C-peptide can be used to help determine type and insulin requirements.  Discussed with patient that if her body is not make insulin or not enough, it would require insulin injections.  History includes:  Type 2 Diabetes (2023), COVID (2021) and hospitalized - long covid, HLD, OSA- cpap, Oxygen at night, HTN, CHF Medication includes:  Farxiga, glipizide, spironolactone, torsemide, atorvastatin, MVI, vitamin C, Vitamin D3 (patient declined insulin and Metformin) Labs noted: 09/01/2021 - A1C >15.5%, Glucose 661, BUN 10, Creatinine 1.13, eGFR 57, Cholesterol 357, HDL 52, LDL 275, Triglycerides 148  Weight hx: 210 lbs 09/06/2021 240 lbs 07/21/2021  Patient lives with her husband.  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 She does get food stamps. No fried food.  No added salt.  Cooks with fresh foods.  Height 5' 4.5" (1.638 m), weight 210 lb (95.3 kg). Body mass index is 35.49 kg/m.   Diabetes Self-Management Education - 09/06/21 0842       Visit Information   Visit Type First/Initial      Initial Visit   Diabetes Type Type 2    Are you currently following a meal plan? No    Are you taking your medications as prescribed? Yes    Date Diagnosed 2021      Health Coping   How would you rate your overall health? Poor      Psychosocial Assessment   Patient Belief/Attitude about Diabetes Motivated to manage diabetes    Self-care  barriers Lack of material resources    Self-management support Doctor's office    Other persons present Patient    Patient Concerns Nutrition/Meal planning;Glycemic Control    Special Needs None    Preferred Learning Style No preference indicated    Learning Readiness Ready    How often do you need to have someone help you when you read instructions, pamphlets, or other written materials from your doctor or pharmacy? 1 - Never    What is the last grade level you completed in school? 12      Pre-Education Assessment   Patient understands the diabetes disease and treatment process. Needs Instruction    Patient understands incorporating nutritional management into lifestyle. Needs Instruction    Patient undertands incorporating physical activity into lifestyle. Needs Instruction    Patient understands using medications safely. Needs Instruction    Patient understands monitoring blood glucose, interpreting and using results Needs Instruction    Patient understands prevention, detection, and treatment of acute complications. Needs Instruction    Patient understands prevention, detection, and treatment of chronic complications. Needs Instruction    Patient understands how to develop strategies to address psychosocial issues. Needs Instruction    Patient understands how to develop strategies to promote health/change behavior. Needs Instruction      Complications   Last HgB A1C per patient/outside source 15.5 %   >15.5 09/01/2021   How often do you  check your blood sugar? 1-2 times/day    Fasting Blood glucose range (mg/dL) >200   325 09/05/2021   Postprandial Blood glucose range (mg/dL) >200   284 09/05/2021 pm     Dietary Intake   Breakfast oatmeal, 1 Tablespoon brown sugar, 1/2 tsp butter    Snack (morning) none    Lunch salad with black beans, corn, grilled chicken    Snack (afternoon) berries, pineapple, melon    Dinner Salad with black beans, corn, and grilled chicken    Snack (evening)  fruit    Beverage(s) water, unsweetened hibiscous tea      Exercise   Exercise Type ADL's      Patient Education   Previous Diabetes Education No    Disease state  Definition of diabetes, type 1 and 2, and the diagnosis of diabetes    Nutrition management  Role of diet in the treatment of diabetes and the relationship between the three main macronutrients and blood glucose level;Food label reading, portion sizes and measuring food.;Meal timing in regards to the patients' current diabetes medication.;Meal options for control of blood glucose level and chronic complications.    Physical activity and exercise  Role of exercise on diabetes management, blood pressure control and cardiac health.;Helped patient identify appropriate exercises in relation to his/her diabetes, diabetes complications and other health issue.    Medications Reviewed patients medication for diabetes, action, purpose, timing of dose and side effects.   discussed possible need of insulin   Monitoring Identified appropriate SMBG and/or A1C goals.;Daily foot exams;Yearly dilated eye exam    Acute complications Taught treatment of hypoglycemia - the 15 rule.;Discussed and identified patients' treatment of hyperglycemia.    Chronic complications Relationship between chronic complications and blood glucose control    Psychosocial adjustment Worked with patient to identify barriers to care and solutions;Role of stress on diabetes;Identified and addressed patients feelings and concerns about diabetes      Individualized Goals (developed by patient)   Nutrition Follow meal plan discussed    Physical Activity Exercise 5-7 days per week;30 minutes per day   start slow and increase as tolerated   Medications take my medication as prescribed    Monitoring  test my blood glucose as discussed    Reducing Risk Other (comment)   low fat, whole food plant based primarily, fish lean meat if you choose     Post-Education Assessment   Patient  understands the diabetes disease and treatment process. Demonstrates understanding / competency    Patient understands incorporating nutritional management into lifestyle. Needs Review    Patient undertands incorporating physical activity into lifestyle. Demonstrates understanding / competency    Patient understands using medications safely. Demonstrates understanding / competency    Patient understands monitoring blood glucose, interpreting and using results Demonstrates understanding / competency    Patient understands prevention, detection, and treatment of acute complications. Demonstrates understanding / competency    Patient understands prevention, detection, and treatment of chronic complications. Demonstrates understanding / competency    Patient understands how to develop strategies to address psychosocial issues. Demonstrates understanding / competency    Patient understands how to develop strategies to promote health/change behavior. Needs Review      Outcomes   Expected Outcomes Demonstrated interest in learning. Expect positive outcomes    Future DMSE 4-6 wks    Program Status Not Completed             Individualized Plan for Diabetes Self-Management Training:   Learning Objective:  Patient  will have a greater understanding of diabetes self-management. Patient education plan is to attend individual and/or group sessions per assessed needs and concerns.   Plan:   Patient Instructions  Eye Exam Call your doctor about your foot Pick up your prescription    for your yeast infection Consider Whole Foods Plant Based Diet discussed today.  Aim for 2-3 Carb Choices per meal (30-45 grams) +/- 1 either way  Aim for 0-15 Carbs per snack if hungry  Include protein in moderation with your meals and snacks Consider reading food labels for Total Carbohydrate of foods Consider  increasing your activity level by walking for 30 minutes daily as tolerated Continue checking BG at  alternate times per day  Continue taking medication as directed by MD    Expected Outcomes:  Demonstrated interest in learning. Expect positive outcomes  Education material provided: ADA - How to Thrive: A Guide for Your Journey with Diabetes, Food label handouts, and Diabetes Resources  If problems or questions, patient to contact team via:  Phone  Future DSME appointment: 4-6 wks

## 2021-09-06 NOTE — Patient Instructions (Addendum)
Eye Exam Call your doctor about your foot Pick up your prescription    for your yeast infection Consider Whole Foods Plant Based Diet discussed today.  Aim for 2-3 Carb Choices per meal (30-45 grams) +/- 1 either way  Aim for 0-15 Carbs per snack if hungry  Include protein in moderation with your meals and snacks Consider reading food labels for Total Carbohydrate of foods Consider  increasing your activity level by walking for 30 minutes daily as tolerated Continue checking BG at alternate times per day  Continue taking medication as directed by MD

## 2021-09-13 ENCOUNTER — Ambulatory Visit: Payer: Self-pay | Admitting: Nurse Practitioner

## 2021-09-14 ENCOUNTER — Other Ambulatory Visit (HOSPITAL_COMMUNITY): Payer: Self-pay

## 2021-09-14 MED FILL — Spironolactone Tab 25 MG: ORAL | 30 days supply | Qty: 30 | Fill #2 | Status: AC

## 2021-09-14 NOTE — Progress Notes (Signed)
ADVANCED HF CLINIC NOTE   PCP: Vevelyn Francois, NP HF Cardiologist: Dr Haroldine Laws  HPI: [name] is a 56 y.o. with a history of HTN, obesity, and NICM/systolic heart failure with recovered EF.   Admitted 5/21 with new onset HF. Echo EF 35-40%. RV moderately HK.  R/LHC with minimal CAD, elevated filling pressures and preserved cardiac output. Diuresed with IV lasix and transitioned to torsemide 20 mg daily. Also concern for sleep apnea.   Echo 05/11/20: EF 55% RV ok.   Admitted 1/22 with respiratory failure due to COVID PNA. Was in hospital for 17 days and discharged on O2.   Seen in AHF clinic 2/22 and doing well. Echo (4/22) showed EF stable 55-60%, mild LVH, grade I DD, RV ok. She was then graduated from [facility] clinic and referred to [facility] Dr. Lucretia Field.  She presented for an acute visit 09/01/21 for fatigue. She had stopped all HF meds for 4 days. Labs at visit showed blood glucose > 600 and advised to go to ED for evaluation, however she declined and went to PCP. CBC, iron studies and TSH normal. Suspect symptoms related to uncontrolled blood sugars, and OSA.  Today she returns for HF follow up. Overall feeling better. Main complaint is blurry vision. Has discussed this with her PCP and plans on seeing eye MD soon. Denies increasing SOB, CP, dizziness, edema, or PND/Orthopnea. Appetite ok. No fever or chills. Weight at home 210 pounds. Taking all medications. Recently treated for yeast infection. Motivated to improve her blood glucose and is working with a Automotive engineer.   Cardiac Studies: - Echo (4/22): EF stable 55-60%, mild LVH, grade I DD, RV ok.  - Echo (8/21): EF 55%, RV ok.  - R/LHC 5/21 Ao = 131/86 (109) LV = 138/29 RA =   9 RV = 58/15 PA = 63/23 (38) PCW = 21 Fick cardiac output/index = 5.2/2.4 PVR = 3.2 WU FA sat = 93% PA sat = 62%, 67%  ABG: 7.27/88/81/93%  Assessment: 1. 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 Severe NICM EF 25% suspect due to HTN and undiagnosed  OSA 3. Elevated filling pressures with normal cardiac output 4. OHS/OSA with CO2 retention (patient received 2mg  versed and 69mcg fentanyl during cath)  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) (HCC)    Chest pain of uncertain etiology, non obstructive CAD, pain due to acute HF 12/12/2019   CHF (congestive heart failure) (HCC)    Diabetes mellitus without complication ([place])    Hyperlipidemia    Hypertension    Hypoventilation associated with obesity ([place]) 12/12/2019   Morbid obesity ([place])    NICM (nonischemic cardiomyopathy) ([place])    OSA (obstructive sleep apnea) 12/12/2019   Pulmonary hypertension, unspecified ([place]) 12/12/2019   Current Outpatient Medications  Medication Sig Dispense Refill   Ascorbic Acid (VITAMIN C PO) Take by mouth.     aspirin 81 MG EC tablet Take 1 tablet (81 mg total) by mouth daily. 90 tablet 3   atorvastatin (LIPITOR) 80 MG tablet Take 1 tablet (80 mg total) by mouth daily. 90 tablet 3   benzonatate (TESSALON) 100 MG capsule TAKE 1 CAPSULE BY MOUTH 3 TIMES DAILY AS NEEDED FOR UP TO 10 DAYS FOR COUGH. NEVER SUCK OR CHEW ON A BENZONATATE CAP 30 capsule 0   carvedilol (COREG) 6.25 MG tablet Take 1 tablet (6.25 mg total) by mouth 2 (two) times daily. 60 tablet 11   cholecalciferol (VITAMIN D3)  25 MCG (1000 UNIT) tablet Take 1,000 Units by mouth daily.     dapagliflozin propanediol (FARXIGA) 10 MG TABS tablet Take 1 tablet (10 mg total) by mouth daily before breakfast. 30 tablet 5   docusate sodium (COLACE) 100 MG capsule Take 1 capsule (100 mg total) by mouth 2 (two) times daily. 10 capsule 0   glipiZIDE (GLUCOTROL) 10 MG tablet Take 1 tablet (10 mg total) by mouth 2 (two) times daily before a meal. 60 tablet 3   Multiple Vitamins-Minerals (ZINC PO) Take by mouth.     olopatadine (PATANOL) 0.1 % ophthalmic solution PLACE 1 DROP INTO THE RIGHT EYE 2 (TWO) TIMES DAILY. 5 mL 12   pantoprazole (PROTONIX) 40 MG  tablet TAKE 1 TABLET (40 MG TOTAL) BY MOUTH DAILY. (Patient taking differently: Take 40 mg by mouth daily as needed (stomach).) 30 tablet 11   polyethylene glycol (MIRALAX / GLYCOLAX) 17 g packet Take 17 g by mouth daily as needed (constipation). 14 each 0   sacubitril-valsartan (ENTRESTO) 49-51 MG Take 1 tablet by mouth 2 (two) times daily. 180 tablet 3   spironolactone (ALDACTONE) 25 MG tablet Take 1 tablet (25 mg total) by mouth daily. 30 tablet 5   torsemide (DEMADEX) 20 MG tablet Take 1 tablet (20 mg total) by mouth daily. 30 tablet 3   TRUEplus Lancets 28G MISC 1 each by Does not apply route in the morning and at bedtime. 100 each 1   No current facility-administered medications for this encounter.   Allergies  Allergen Reactions   Hyzaar [Losartan Potassium-Hctz] Nausea And Vomiting and Cough    Patient can take Entresto now   Social History   Socioeconomic History   Marital status: Married    Spouse name: Not on file   Number of children: Not on file   Years of education: Not on file   Highest education level: Not on file  Occupational History   Not on file  Tobacco Use   Smoking status: Never   Smokeless tobacco: Never  Vaping Use   Vaping Use: Never used  Substance and Sexual Activity   Alcohol use: No   Drug use: No   Sexual activity: Yes  Other Topics Concern   Not on file  Social History Narrative   Not on file   Social Determinants of Health   Financial Resource Strain: Not on file  Food Insecurity: Not on file  Transportation Needs: Not on file  Physical Activity: Not on file  Stress: Not on file  Social Connections: Not on file  Intimate Partner Violence: Not on file   Family History  Problem Relation Age of Onset   Cancer Mother    Hyperlipidemia Mother    Diabetes Father    Hypertension Father    Hyperlipidemia Sister    Hypertension Sister    Hyperlipidemia Brother    Hypertension Brother    Cancer Maternal Grandmother    Stroke Maternal  Grandmother    Cancer Maternal Grandfather    Diabetes Maternal Grandfather    Heart disease Maternal Grandfather    Hypertension Maternal Grandfather    Diabetes Paternal Grandmother    Hypertension Paternal Grandmother    Cancer Paternal Grandfather    Hypertension Paternal Grandfather    Stroke Paternal Grandfather    BP (!) 100/50    Pulse 72    Ht 5' 4.5" (1.638 m)    Wt 95.7 kg (211 lb)    SpO2 97%    BMI 35.66 kg/m  Wt Readings from Last 3 Encounters:  09/15/21 95.7 kg (211 lb)  09/06/21 95.3 kg (210 lb)  09/01/21 95.4 kg (210 lb 6.4 oz)   PHYSICAL EXAM: General:  NAD. No resp difficulty HEENT: Normal Neck: Supple. No JVD. Carotids 2+ bilat; no bruits. No lymphadenopathy or thryomegaly appreciated. Cor: PMI nondisplaced. Regular rate & rhythm. No rubs, gallops or murmurs. Lungs: Clear Abdomen: Obese, nontender, nondistended. No hepatosplenomegaly. No bruits or masses. Good bowel sounds. Extremities: No cyanosis, clubbing, rash, edema Neuro: Alert & oriented x 3, cranial nerves grossly intact. Moves all 4 extremities w/o difficulty. Affect pleasant.  ASSESSMENT & PLAN: 1. Chronic Systolic Heart Failure, NICM (likely hypertensive) - Echo 11/2019 EF 35-40% RV moderately reduced.  - Cath 11/2019 with minimal cardiac disease and preserved cardiac output. EF on cath 25%.  - Echo 05/11/20: EF 55% RV ok Personally reviewed - Echo 4/22 EF 55-60%, mild LVH, grade I DD, RV ok - NYHA I-II. Volume status ok. - Continue torsemide 20 mg daily. - Continue carvedilol 6.25 mg bid.  - Continue Entresto 49/51 mg bid. - Continue spiro 25 mg daily. - With recent yeast infection and A1C > 15.5, stop [place]. - Recent BMET ok.  2. HTN - Stable.  - Continue current regimen.  3. Obesity  Body mass index is 35.66 kg/m. - Continue weight loss efforts.   4. Suspected Sleep Apnea  - Needs to wear CPAP, has sleep study arranged.  5. DMII - Uncontrolled, recent A21c > 15.5 - Followed  by PCP. - Stop [place].  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 - Remains fatigued.  Stable from HF standpoint. Will graduate from AHF clinic and refer to [facility] general cardiology, Dr. Oval Linsey. We will be happy to see back on an as needed basis.  Rafael Bihari FNP 10:16 AM

## 2021-09-15 ENCOUNTER — Encounter (HOSPITAL_COMMUNITY): Payer: Self-pay

## 2021-09-15 ENCOUNTER — Other Ambulatory Visit: Payer: Self-pay

## 2021-09-15 ENCOUNTER — Other Ambulatory Visit (HOSPITAL_COMMUNITY): Payer: Self-pay

## 2021-09-15 ENCOUNTER — Ambulatory Visit (HOSPITAL_COMMUNITY)
Admission: RE | Admit: 2021-09-15 | Discharge: 2021-09-15 | Disposition: A | Discharge status: Home / Self Care | Payer: Self-pay | Source: Ambulatory Visit | Attending: Family Medicine | Admitting: Family Medicine

## 2021-09-15 VITALS — BP 100/50 | HR 72 | Ht 64.5 in | Wt 211.0 lb

## 2021-09-15 DIAGNOSIS — E119 Type 2 diabetes mellitus without complications: Secondary | ICD-10-CM | POA: Insufficient documentation

## 2021-09-15 DIAGNOSIS — I110 Hypertensive heart disease with heart failure: Secondary | ICD-10-CM | POA: Insufficient documentation

## 2021-09-15 DIAGNOSIS — Z6835 Body mass index (BMI) 35.0-35.9, adult: Secondary | ICD-10-CM | POA: Insufficient documentation

## 2021-09-15 DIAGNOSIS — I5042 Chronic combined systolic (congestive) and diastolic (congestive) heart failure: Secondary | ICD-10-CM | POA: Insufficient documentation

## 2021-09-15 DIAGNOSIS — I5043 Acute on chronic combined systolic (congestive) and diastolic (congestive) heart failure: Secondary | ICD-10-CM

## 2021-09-15 DIAGNOSIS — G4733 Obstructive sleep apnea (adult) (pediatric): Secondary | ICD-10-CM

## 2021-09-15 DIAGNOSIS — I10 Essential (primary) hypertension: Secondary | ICD-10-CM

## 2021-09-15 DIAGNOSIS — U099 Post covid-19 condition, unspecified: Secondary | ICD-10-CM | POA: Insufficient documentation

## 2021-09-15 DIAGNOSIS — Z79899 Other long term (current) drug therapy: Secondary | ICD-10-CM | POA: Insufficient documentation

## 2021-09-15 DIAGNOSIS — I428 Other cardiomyopathies: Secondary | ICD-10-CM | POA: Insufficient documentation

## 2021-09-15 NOTE — Patient Instructions (Signed)
STOP Farxiga   CONGRATULATIONS YOU HAVE GRADUATED FROM THE ADVANCED HE[facility] FAILURE CLINIC!!!!! IT HAS BEEN A PLEASURE HAVING YOU AS A PATIENT AND WE WISH YOU WELL!!  You have been referred to [facility] with Dr Duke Salvia -they will be in touch with an appointment        At the [facility], you and your health needs are our priority. As part of our continuing mission to provide you with exceptional heart care, we have created designated Provider Care Teams. These Care Teams include your primary Cardiologist (physician) and Advanced Practice Providers (APPs- Physician Assistants and Nurse Practitioners) who all work together to provide you with the care you need, when you need it.   You may see any of the following providers on your designated Care Team at your next follow up: Dr Arvilla Meres Dr Carron Curie, NP Robbie Lis, [place], [place] Karle Plumber, PharmD   Please be sure to bring in all your medications bottles to every appointment.  Marland Kitchen

## 2021-09-15 NOTE — Addendum Note (Signed)
Encounter addended by: Jacklynn Ganong, FNP on: 09/15/2021 1:26 PM  Actions taken: Visit diagnoses modified

## 2021-09-24 ENCOUNTER — Telehealth (HOSPITAL_COMMUNITY): Payer: Self-pay | Admitting: Pharmacist

## 2021-09-24 NOTE — Telephone Encounter (Signed)
Medication Samples have been provided to the patient. ?  ?Drug name: Entresto     Strength: 49/51      Qty: 2 botles                 LOT: [id number] Exp.Date: 07/2023 ?   ?The patient has been instructed regarding the correct time, dose, and frequency of taking this medication, including desired effects and most common side effects.  ? ? ?Patient will bring sign patient assistance application to the clinic to re-apply for Entresto PAP.  ? ?Karle Plumber, PharmD, BCPS, CPP ?Heart Failure Clinic Pharmacist ?[phone] ? ? ? ?

## 2021-09-27 ENCOUNTER — Other Ambulatory Visit (HOSPITAL_COMMUNITY): Payer: Self-pay

## 2021-09-27 ENCOUNTER — Ambulatory Visit: Payer: Self-pay | Admitting: Nurse Practitioner

## 2021-09-28 ENCOUNTER — Telehealth (HOSPITAL_COMMUNITY): Payer: Self-pay | Admitting: Licensed Clinical Social Worker

## 2021-09-30 ENCOUNTER — Ambulatory Visit (INDEPENDENT_AMBULATORY_CARE_PROVIDER_SITE_OTHER): Payer: Self-pay | Admitting: Nurse Practitioner

## 2021-09-30 ENCOUNTER — Encounter: Payer: Self-pay | Admitting: Nurse Practitioner

## 2021-09-30 ENCOUNTER — Other Ambulatory Visit: Payer: Self-pay

## 2021-09-30 VITALS — BP 86/63 | HR 72 | Temp 97.9°F | Ht 65.0 in | Wt 208.8 lb

## 2021-09-30 DIAGNOSIS — E1165 Type 2 diabetes mellitus with hyperglycemia: Secondary | ICD-10-CM

## 2021-09-30 DIAGNOSIS — R1084 Generalized abdominal pain: Secondary | ICD-10-CM

## 2021-09-30 DIAGNOSIS — M545 Low back pain, unspecified: Secondary | ICD-10-CM

## 2021-09-30 LAB — POCT URINALYSIS DIP (CLINITEK)
Bilirubin, UA: NEGATIVE
Blood, UA: NEGATIVE
Glucose, UA: NEGATIVE mg/dL
Ketones, POC UA: NEGATIVE mg/dL
Leukocytes, UA: NEGATIVE
Nitrite, UA: NEGATIVE
Spec Grav, UA: 1.015 (ref 1.010–1.025)
Urobilinogen, UA: 0.2 E.U./dL
pH, UA: 5.5 (ref 5.0–8.0)

## 2021-09-30 MED ORDER — INSULIN GLARGINE 100 UNIT/ML SOLOSTAR PEN
20.0000 [IU] | PEN_INJECTOR | Freq: Every day | SUBCUTANEOUS | 11 refills | Status: DC
  Filled 2021-09-30: qty 6, 30d supply, fill #0
  Filled 2021-10-25: qty 6, 30d supply, fill #1
  Filled 2021-11-22: qty 6, 30d supply, fill #2
  Filled 2021-12-27: qty 6, 30d supply, fill #3
  Filled 2022-02-07: qty 6, 30d supply, fill #4
  Filled 2022-03-04: qty 6, 30d supply, fill #5
  Filled 2022-04-14: qty 6, 30d supply, fill #6
  Filled 2022-05-18: qty 6, 30d supply, fill #7
  Filled 2022-06-20: qty 6, 30d supply, fill #8
  Filled 2022-07-14 – 2022-07-22 (×2): qty 6, 30d supply, fill #9
  Filled 2022-08-24: qty 6, 30d supply, fill #10
  Filled 2022-09-19: qty 6, 30d supply, fill #11

## 2021-09-30 MED ORDER — INSULIN PEN NEEDLE 31G X 5 MM MISC
1.0000 "pen " | Freq: Every day | 11 refills | Status: DC
  Filled 2021-09-30: qty 100, 100d supply, fill #0
  Filled 2021-12-27: qty 100, 90d supply, fill #1
  Filled 2022-03-04: qty 100, 90d supply, fill #2
  Filled 2022-07-01: qty 100, 90d supply, fill #3

## 2021-09-30 NOTE — Telephone Encounter (Signed)
Pt called to inquire about assistance with current electric bill.  Reports they owe over $600 and that they have received a shut off notice.  Informed pt of criteria to use Patient Care [employer] and documentation that we would need to move forward- encouraged them to first speak with [employer] about setting up payment plan to avoid shut off. ? ?Pt will reach back out to CSW if they need further assistance. ? ?Burna Sis, LCSW ?Clinical Social Worker ?[facility] ?Desk#: [phone] ?Cell#: [phone] ? ?

## 2021-09-30 NOTE — Patient Instructions (Addendum)
Carvedilol 6.25 mg cut in half 3.12 mg twice a day  ? ? ?Please call [facility] billing [phone] to ask about if you have financial assistance. If not please fill out the application and give our social worker Carleene Cooper a call once you have completed your application. ? ? ? ?Diabetes Mellitus and Nutrition, Adult ?When you have diabetes, or diabetes mellitus, it is very important to have healthy eating habits because your blood sugar (glucose) levels are greatly affected by what you eat and drink. Eating healthy foods in the right amounts, at about the same times every day, can help you: ?Manage your blood glucose. ?Lower your risk of heart disease. ?Improve your blood pressure. ?Reach or maintain a healthy weight. ?What can affect my meal plan? ?Every person with diabetes is different, and each person has different needs for a meal plan. Your health care provider may recommend that you work with a dietitian to make a meal plan that is best for you. Your meal plan may vary depending on factors such as: ?The calories you need. ?The medicines you take. ?Your weight. ?Your blood glucose, blood pressure, and cholesterol levels. ?Your activity level. ?Other health conditions you have, such as heart or kidney disease. ?How do carbohydrates affect me? ?Carbohydrates, also called carbs, affect your blood glucose level more than any other type of food. Eating carbs raises the amount of glucose in your blood. ?It is important to know how many carbs you can safely have in each meal. This is different for every person. Your dietitian can help you calculate how many carbs you should have at each meal and for each snack. ?How does alcohol affect me? ?Alcohol can cause a decrease in blood glucose (hypoglycemia), especially if you use insulin or take certain diabetes medicines by mouth. Hypoglycemia can be a life-threatening condition. Symptoms of hypoglycemia, such as sleepiness, dizziness, and confusion, are similar to  symptoms of having too much alcohol. ?Do not drink alcohol if: ?Your health care provider tells you not to drink. ?You are pregnant, may be pregnant, or are planning to become pregnant. ?If you drink alcohol: ?Limit how much you have to: ?0-1 drink a day for women. ?0-2 drinks a day for men. ?Know how much alcohol is in your drink. In the [place] one drink equals one 12 oz bottle of beer (355 mL), one 5 oz glass of wine (148 mL), or one 1? oz glass of hard liquor (44 mL). ?Keep yourself hydrated with water, diet soda, or unsweetened iced tea. Keep in mind that regular soda, juice, and other mixers may contain a lot of sugar and must be counted as carbs. ?What are tips for following this plan? ?Reading food labels ?Start by checking the serving size on the Nutrition Facts label of packaged foods and drinks. The number of calories and the amount of carbs, fats, and other nutrients listed on the label are based on one serving of the item. Many items contain more than one serving per package. ?Check the total grams (g) of carbs in one serving. ?Check the number of grams of saturated fats and trans fats in one serving. Choose foods that have a low amount or none of these fats. ?Check the number of milligrams (mg) of salt (sodium) in one serving. Most people should limit total sodium intake to less than 2,300 mg per day. ?Always check the nutrition information of foods labeled as "low-fat" or "nonfat." These foods may be higher in added sugar or refined carbs and  should be avoided. ?Talk to your dietitian to identify your daily goals for nutrients listed on the label. ?Shopping ?Avoid buying canned, pre-made, or processed foods. These foods tend to be high in fat, sodium, and added sugar. ?Shop around the outside edge of the grocery store. This is where you will most often find fresh fruits and vegetables, bulk grains, fresh meats, and fresh dairy products. ?Cooking ?Use low-heat cooking methods, such as baking, instead of  high-heat cooking methods, such as deep frying. ?Cook using healthy oils, such as olive, canola, or sunflower oil. ?Avoid cooking with butter, cream, or high-fat meats. ?Meal planning ?Eat meals and snacks regularly, preferably at the same times every day. Avoid going long periods of time without eating. ?Eat foods that are high in fiber, such as fresh fruits, vegetables, beans, and whole grains. ?Eat 4-6 oz (112-168 g) of lean protein each day, such as lean meat, chicken, fish, eggs, or tofu. One ounce (oz) (28 g) of lean protein is equal to: ?1 oz (28 g) of meat, chicken, or fish. ?1 egg. ?? cup (62 g) of tofu. ?Eat some foods each day that contain healthy fats, such as avocado, nuts, seeds, and fish. ?What foods should I eat? ?Fruits ?Berries. Apples. Oranges. Peaches. Apricots. Plums. Grapes. Mangoes. Papayas. Pomegranates. Kiwi. Cherries. ?Vegetables ?Leafy greens, including lettuce, spinach, kale, chard, collard greens, mustard greens, and cabbage. Beets. Cauliflower. Broccoli. Carrots. Green beans. Tomatoes. Peppers. Onions. Cucumbers. Brussels sprouts. ?Grains ?Whole grains, such as whole-wheat or whole-grain bread, crackers, tortillas, cereal, and pasta. Unsweetened oatmeal. Quinoa. Brown or wild rice. ?Meats and other proteins ?Seafood. Poultry without skin. Lean cuts of poultry and beef. Tofu. Nuts. Seeds. ?Dairy ?Low-fat or fat-free dairy products such as milk, yogurt, and cheese. ?The items listed above may not be a complete list of foods and beverages you can eat and drink. Contact a dietitian for more information. ?What foods should I avoid? ?Fruits ?Fruits canned with syrup. ?Vegetables ?Canned vegetables. Frozen vegetables with butter or cream sauce. ?Grains ?Refined white flour and flour products such as bread, pasta, snack foods, and cereals. Avoid all processed foods. ?Meats and other proteins ?Fatty cuts of meat. Poultry with skin. Breaded or fried meats. Processed meat. Avoid saturated  fats. ?Dairy ?Full-fat yogurt, cheese, or milk. ?Beverages ?Sweetened drinks, such as soda or iced tea. ?The items listed above may not be a complete list of foods and beverages you should avoid. Contact a dietitian for more information. ?Questions to ask a health care provider ?Do I need to meet with a certified diabetes care and education specialist? ?Do I need to meet with a dietitian? ?What number can I call if I have questions? ?When are the best times to check my blood glucose? ?Where to find more information: ?American Diabetes Association: diabetes.org ?Academy of Nutrition and Dietetics: eatright.org ?[employer] of Diabetes and Digestive and Kidney Diseases: StageSync.si ?Association of Diabetes Care & Education Specialists: diabeteseducator.org ?Summary ?It is important to have healthy eating habits because your blood sugar (glucose) levels are greatly affected by what you eat and drink. It is important to use alcohol carefully. ?A healthy meal plan will help you manage your blood glucose and lower your risk of heart disease. ?Your health care provider may recommend that you work with a dietitian to make a meal plan that is best for you. ?This information is not intended to replace advice given to you by your health care provider. Make sure you discuss any questions you have with your health  care provider. ?Document Revised: 02/12/2020 Document Reviewed: 02/12/2020 ?Elsevier Patient Education ? [place]. ? ?

## 2021-09-30 NOTE — Progress Notes (Signed)
[facility] Patient [facility] [address] [place], [place]  [address] Phone:  [phone]   Fax:  [phone]   Established Patient Office Visit  Subjective:  Patient ID: Maria Duncan, female    DOB: 03-Sep-1965  Age: 56 y.o. MRN: [id number]  CC:  Chief Complaint  Patient presents with   Follow-up    Patient is here today for her follow up visit for her elevated blood sugar. Patient states that she is still having the blurry vision. Patient is also lower abdominal pains that radiate to her lower back x 3 weeks.     HPI Kelsa D Norrington presents for follow up. 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    Ms. Durrett is in today for diabetes follow up. The prescribed treatment is glipizide 10 mg along with statin therapy atorvastatin. She reports that The reported use of treatment is consistent with prescribed. There are reported side effects from the treatment. Home glucose monitoring indicates a CBG range of  120, 150's-280. She is walking.  She has been baking, grilling and  She is trying to avoid meats. She is very concern about her vision. There has been a professional eye exam.     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) (HCC)    Chest pain of uncertain etiology, non obstructive CAD, pain due to acute HF 12/12/2019   CHF (congestive heart failure) (HCC)    Diabetes mellitus without complication (HCC)    Hyperlipidemia    Hypertension     Hypoventilation associated with obesity (HCC) 12/12/2019   Morbid obesity (HCC)    NICM (nonischemic cardiomyopathy) (HCC)    OSA (obstructive sleep apnea) 12/12/2019   Pulmonary hypertension, unspecified (HCC) 12/12/2019    Past Surgical History:  Procedure Laterality Date   ABDOMINAL HYSTERECTOMY     RIGHT/LEFT HEART CATH AND CORONARY ANGIOGRAPHY N/A 12/09/2019   Procedure: RIGHT/LEFT HEART CATH AND CORONARY ANGIOGRAPHY;  Surgeon: Dolores Patty, MD;  Location: [facility];  Service: Cardiovascular;  Laterality: N/A;    Family History  Problem Relation Age of Onset   Cancer Mother    Hyperlipidemia Mother    Diabetes Father    Hypertension Father    Hyperlipidemia Sister    Hypertension Sister    Hyperlipidemia Brother    Hypertension Brother    Cancer Maternal Grandmother    Stroke Maternal Grandmother    Cancer Maternal Grandfather    Diabetes Maternal Grandfather    Heart disease Maternal Grandfather    Hypertension Maternal Grandfather    Diabetes Paternal Grandmother    Hypertension Paternal Grandmother    Cancer Paternal Grandfather    Hypertension Paternal Grandfather    Stroke Paternal Grandfather     Social History   Socioeconomic History   Marital status: Married    Spouse name: Not on file   Number of children: Not on file   Years of education: Not on file   Highest education level: Not on file  Occupational History   Not on file  Tobacco Use   Smoking status: Never  Smokeless tobacco: Never  Vaping Use   Vaping Use: Never used  Substance and Sexual Activity   Alcohol use: No   Drug use: No   Sexual activity: Yes    Birth control/protection: None  Other Topics Concern   Not on file  Social History Narrative   Not on file   Social Determinants of Health   Financial Resource Strain: Not on file  Food Insecurity: Not on file  Transportation Needs: Not on file  Physical Activity: Not on file  Stress: Not on file  Social  Connections: Not on file  Intimate Partner Violence: Not on file    Outpatient Medications Prior to Visit  Medication Sig Dispense Refill   Ascorbic Acid (VITAMIN C PO) Take by mouth.     aspirin 81 MG EC tablet Take 1 tablet (81 mg total) by mouth daily. 90 tablet 3   atorvastatin (LIPITOR) 80 MG tablet Take 1 tablet (80 mg total) by mouth daily. 90 tablet 3   carvedilol (COREG) 6.25 MG tablet Take 1 tablet (6.25 mg total) by mouth 2 (two) times daily. 60 tablet 11   cholecalciferol (VITAMIN D3) 25 MCG (1000 UNIT) tablet Take 1,000 Units by mouth daily.     glipiZIDE (GLUCOTROL) 10 MG tablet Take 1 tablet (10 mg total) by mouth 2 (two) times daily before a meal. 60 tablet 3   Multiple Vitamins-Minerals (ZINC PO) Take by mouth.     sacubitril-valsartan (ENTRESTO) 49-51 MG Take 1 tablet by mouth 2 (two) times daily. 180 tablet 3   spironolactone (ALDACTONE) 25 MG tablet Take 1 tablet (25 mg total) by mouth daily. 30 tablet 5   torsemide (DEMADEX) 20 MG tablet Take 1 tablet (20 mg total) by mouth daily. (Patient taking differently: Take 20 mg by mouth as needed.) 30 tablet 3   TRUEplus Lancets 28G MISC 1 each by Does not apply route in the morning and at bedtime. 100 each 1   docusate sodium (COLACE) 100 MG capsule Take 1 capsule (100 mg total) by mouth 2 (two) times daily. (Patient not taking: Reported on 09/30/2021) 10 capsule 0   pantoprazole (PROTONIX) 40 MG tablet TAKE 1 TABLET (40 MG TOTAL) BY MOUTH DAILY. (Patient not taking: Reported on 09/30/2021) 30 tablet 11   polyethylene glycol (MIRALAX / GLYCOLAX) 17 g packet Take 17 g by mouth daily as needed (constipation). (Patient not taking: Reported on 09/30/2021) 14 each 0   No facility-administered medications prior to visit.    Allergies  Allergen Reactions   Hyzaar [Losartan Potassium-Hctz] Nausea And Vomiting and Cough    Patient can take Entresto now    ROS Review of Systems    Objective:    Physical Exam Constitutional:       Appearance: She is obese.  HENT:     Head: Normocephalic and atraumatic.     Nose: Nose normal.     Mouth/Throat:     Mouth: Mucous membranes are moist.  Cardiovascular:     Rate and Rhythm: Normal rate and regular rhythm.     Pulses: Normal pulses.     Heart sounds: Normal heart sounds.  Pulmonary:     Effort: Pulmonary effort is normal.     Breath sounds: Normal breath sounds.  Abdominal:     Palpations: Abdomen is soft.     Tenderness: There is abdominal tenderness.  Musculoskeletal:        General: Normal range of motion.     Cervical back: Normal range of motion.  Skin:    General: Skin is warm and dry.     Capillary Refill: Capillary refill takes less than 2 seconds.  Neurological:     General: No focal deficit present.     Mental Status: She is alert and oriented to person, place, and time.  Psychiatric:        Mood and Affect: Mood normal.        Behavior: Behavior normal.        Thought Content: Thought content normal.        Judgment: Judgment normal.    BP (!) 86/63 (BP Location: Left Arm, Cuff Size: Large)    Pulse 72    Temp 97.9 F (36.6 C)    Ht 5\' 5"  (1.651 m)    Wt 208 lb 12.8 oz (94.7 kg)    SpO2 100%    BMI 34.75 kg/m  Wt Readings from Last 3 Encounters:  09/30/21 208 lb 12.8 oz (94.7 kg)  09/15/21 211 lb (95.7 kg)  09/06/21 210 lb (95.3 kg)     Health Maintenance Due  Topic Date Due   COLONOSCOPY (Pts 45-61yrs Insurance coverage will need to be confirmed)  Never done    There are no preventive care reminders to display for this patient.  Lab Results  Component Value Date   TSH 3.632 09/01/2021   Lab Results  Component Value Date   WBC 6.1 09/01/2021   HGB 13.0 09/01/2021   HCT 38.2 09/01/2021   MCV 85.8 09/01/2021   PLT 228 09/01/2021   Lab Results  Component Value Date   NA 132 (L) 09/01/2021   K 4.3 09/01/2021   CO2 32 09/01/2021   GLUCOSE 661 (HH) 09/01/2021   BUN 10 09/01/2021   CREATININE 1.13 (H) 09/01/2021   BILITOT 0.9  09/01/2021   ALKPHOS 91 09/01/2021   AST 19 09/01/2021   ALT 23 09/01/2021   PROT 7.3 09/01/2021   ALBUMIN 3.7 09/01/2021   CALCIUM 9.8 09/01/2021   ANIONGAP 13 09/01/2021   Lab Results  Component Value Date   CHOL 357 (H) 09/01/2021   Lab Results  Component Value Date   HDL 52 09/01/2021   Lab Results  Component Value Date   LDLCALC 275 (H) 09/01/2021   Lab Results  Component Value Date   TRIG 148 09/01/2021   Lab Results  Component Value Date   CHOLHDL 6.9 09/01/2021   Lab Results  Component Value Date   HGBA1C >15.5 (H) 09/01/2021      Assessment & Plan:   Problem List Items Addressed This Visit   None Visit Diagnoses     Type 2 diabetes mellitus with hyperglycemia, unspecified whether long term insulin use (HCC)    -  Primary Long discussion with patient related to diabetes Encourage patient that insulin therapy may be necessary to obtain glycemic control.  She is fearful of needles however willing to give it a try for her health.  We will continue with current regimen and at Lantus 20 units nightly.  Follow-up in 2 weeks for reevaluation.  Patient encouraged to bring glucometer.   Relevant Medications   insulin glargine (LANTUS) 100 UNIT/ML Solostar Pen   Other Relevant Orders   Ambulatory referral to Ophthalmology   Bilateral low back pain, unspecified chronicity, unspecified whether sciatica present       Abdominal pain, generalized       Relevant Orders   POCT URINALYSIS DIP (CLINITEK) (Completed)       Meds ordered this  encounter  Medications   insulin glargine (LANTUS) 100 UNIT/ML Solostar Pen    Sig: Inject 20 Units into the skin daily.    Dispense:  15 mL    Refill:  11    Order Specific Question:   Supervising Provider    Answer:   Quentin Angst [[id number]]   Insulin Pen Needle (PEN NEEDLES) 30G X 5 MM MISC    Sig: use 1 pen needle daily at 2 PM.    Dispense:  100 each    Refill:  11    Order Specific Question:   Supervising  Provider    Answer:   Quentin Angst [[id number]]    Follow-up: Return in about 2 weeks (around 10/14/2021) for follow up [id number].    Barbette Merino, NP

## 2021-10-01 ENCOUNTER — Telehealth: Payer: Self-pay | Admitting: Clinical

## 2021-10-01 NOTE — Telephone Encounter (Signed)
[facility] ?General Follow Up Note ? ?10/01/2021 ?Name: Maria Duncan MRN: [id number] DOB: 27-Jul-1965 ?Maria Duncan is a 56 y.o. year old female who sees Barbette Merino, NP for primary care. LCSW was initially consulted to assist the patient with [employer].  ? ?Interpreter: No.   Interpreter Name & Language: none ? ?Assessment: Patient experiencing financial difficulties related to low income and no health coverage. Patient wants to apply for the [facility]; she received an application packet while here for PCP visit yesterday. ? ?Ongoing Intervention: Today CSW called patient. Reviewed [employer] and [employer]. Advised patient on supporting documents to be submitted with the application. Advised patient to follow up with CSW for assistance in scheduling appointment with financial counselor at [facility]) to submit the application. Provided CSW contact information.  ? ?Review of patient status, including review of consultants reports, relevant laboratory and other test results, and collaboration with appropriate care team members and the patient's provider was performed as part of comprehensive patient evaluation and provision of services.   ? ?Abigail Butts, LCSW ?Patient Care Center ?[facility] ?[phone] ?  ? ?

## 2021-10-04 ENCOUNTER — Ambulatory Visit: Payer: Self-pay | Admitting: Dietician

## 2021-10-05 ENCOUNTER — Other Ambulatory Visit (HOSPITAL_COMMUNITY): Payer: Self-pay

## 2021-10-05 ENCOUNTER — Ambulatory Visit (HOSPITAL_BASED_OUTPATIENT_CLINIC_OR_DEPARTMENT_OTHER): Payer: Self-pay | Admitting: Internal Medicine

## 2021-10-05 ENCOUNTER — Telehealth (HOSPITAL_COMMUNITY): Payer: Self-pay | Admitting: Pharmacy Technician

## 2021-10-05 MED ORDER — SACUBITRIL-VALSARTAN 49-51 MG PO TABS: 1.0000 | ORAL_TABLET | Freq: Two times a day (BID) | ORAL | 3 refills | Status: DC

## 2021-10-05 NOTE — Telephone Encounter (Signed)
Advanced Heart Failure Patient Advocate Encounter ? ?Sent in [employer] application via fax. Of note, POI will be required to complete application process. Document scanned to chart.  ? ?Will follow up. ? ?

## 2021-10-13 ENCOUNTER — Other Ambulatory Visit: Payer: Self-pay

## 2021-10-13 MED FILL — Spironolactone Tab 25 MG: ORAL | 30 days supply | Qty: 30 | Fill #0 | Status: AC

## 2021-10-14 ENCOUNTER — Ambulatory Visit: Payer: Self-pay | Admitting: Nurse Practitioner

## 2021-10-15 ENCOUNTER — Other Ambulatory Visit: Payer: Self-pay

## 2021-10-15 ENCOUNTER — Encounter: Payer: Self-pay | Admitting: Nurse Practitioner

## 2021-10-15 ENCOUNTER — Ambulatory Visit (INDEPENDENT_AMBULATORY_CARE_PROVIDER_SITE_OTHER): Payer: Self-pay | Admitting: Nurse Practitioner

## 2021-10-15 VITALS — BP 95/66 | HR 77 | Temp 97.9°F | Ht 65.0 in | Wt 212.0 lb

## 2021-10-15 DIAGNOSIS — E1165 Type 2 diabetes mellitus with hyperglycemia: Secondary | ICD-10-CM

## 2021-10-15 LAB — POCT URINALYSIS DIP (CLINITEK)
Bilirubin, UA: NEGATIVE
Blood, UA: NEGATIVE
Glucose, UA: NEGATIVE mg/dL
Ketones, POC UA: NEGATIVE mg/dL
Leukocytes, UA: NEGATIVE
Nitrite, UA: NEGATIVE
POC PROTEIN,UA: NEGATIVE
Spec Grav, UA: 1.01 (ref 1.010–1.025)
Urobilinogen, UA: 0.2 E.U./dL
pH, UA: 6 (ref 5.0–8.0)

## 2021-10-15 LAB — POCT GLYCOSYLATED HEMOGLOBIN (HGB A1C)
HbA1c POC (<> result, manual entry): 11.4 %
HbA1c, POC (controlled diabetic range): 11.4 % — AB (ref 0.0–7.0)
HbA1c, POC (prediabetic range): 11.4 % — AB (ref 5.7–6.4)
Hemoglobin A1C: 11.4 % — AB (ref 4.0–5.6)

## 2021-10-15 MED ORDER — TRUE METRIX BLOOD GLUCOSE TEST VI STRP
ORAL_STRIP | 12 refills | Status: AC
  Filled 2021-10-15: qty 100, 50d supply, fill #0
  Filled 2022-06-21 – 2022-07-01 (×2): qty 100, 50d supply, fill #1
  Filled 2022-08-24: qty 100, 50d supply, fill #2

## 2021-10-15 MED ORDER — TRUEPLUS LANCETS 28G MISC
1.0000 | Freq: Two times a day (BID) | 1 refills | Status: AC
  Filled 2021-10-15: qty 100, 50d supply, fill #0
  Filled 2022-06-21 – 2022-07-01 (×2): qty 100, 50d supply, fill #1

## 2021-10-15 NOTE — Progress Notes (Signed)
? ?[facility] ?[address] ?[place], [place]  [address] ?Phone:  [phone]   Fax:  [phone] ? ? ?Established Patient Office Visit ? ?Subjective:  ?Patient ID: Maria Duncan, female    DOB: 06-28-1966  Age: 56 y.o. MRN: [id number] ? ?CC:  ?Chief Complaint  ?Patient presents with  ? Follow-up  ?  Pt is here for 2 week follow up. Pt is requesting a refill on her   ? ? ?HPI ?Maria Duncan presents for follow up. 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  ? ?Ms. Wildeman is in today for diabetes follow up. The prescribed treatment is Lantus 20 units  along with statin therapy atorvastatin . The reported use of treatment is consistent with prescribed. There are no reported side effects from the treatment. Home glucose monitoring indicates a CBG range of 130  to 180. She continues to work on her diet.  ? ? ?Past Medical History:  ?Diagnosis Date  ? 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 Chest pain of uncertain etiology, non obstructive CAD, pain due to acute HF 12/12/2019  ? CHF (congestive heart failure) (HCC)   ? Diabetes mellitus without complication (HCC)   ? Hyperlipidemia   ? Hypertension   ? Hypoventilation associated with obesity (HCC) 12/12/2019  ? Morbid obesity (HCC)   ? NICM (nonischemic cardiomyopathy) (HCC)   ? OSA (obstructive sleep apnea) 12/12/2019  ? Pulmonary hypertension, unspecified (HCC) 12/12/2019  ? ? ?Past Surgical History:  ?Procedure Laterality Date  ? ABDOMINAL  HYSTERECTOMY    ? RIGHT/LEFT HEART CATH AND CORONARY ANGIOGRAPHY N/A 12/09/2019  ? Procedure: RIGHT/LEFT HEART CATH AND CORONARY ANGIOGRAPHY;  Surgeon: Dolores Patty, MD;  Location: [facility];  Service: Cardiovascular;  Laterality: N/A;  ? ? ?Family History  ?Problem Relation Age of Onset  ? Cancer Mother   ? Hyperlipidemia Mother   ? Diabetes Father   ? Hypertension Father   ? Hyperlipidemia Sister   ? Hypertension Sister   ? Hyperlipidemia Brother   ? Hypertension Brother   ? Cancer Maternal Grandmother   ? Stroke Maternal Grandmother   ? Cancer Maternal Grandfather   ? Diabetes Maternal Grandfather   ? Heart disease Maternal Grandfather   ? Hypertension Maternal Grandfather   ? Diabetes Paternal Grandmother   ? Hypertension Paternal Grandmother   ? Cancer Paternal Grandfather   ? Hypertension Paternal Grandfather   ? Stroke Paternal Grandfather   ? ? ?Social History  ? ?Socioeconomic History  ? Marital status: Married  ?  Spouse name: Not on file  ? Number of children: Not on file  ? Years of education: Not on file  ? Highest education level: Not on file  ?Occupational History  ? Not on file  ?Tobacco Use  ? Smoking status: Never  ? Smokeless tobacco: Never  ?Vaping Use  ? Vaping Use: Never used  ?Substance and Sexual Activity  ? Alcohol use: No  ? Drug use: No  ? Sexual activity: Yes  ?  Birth control/protection: None  ?Other Topics Concern  ? Not on file  ?  Social History Narrative  ? Not on file  ? ?Social Determinants of Health  ? ?Financial Resource Strain: Not on file  ?Food Insecurity: Not on file  ?Transportation Needs: Not on file  ?Physical Activity: Not on file  ?Stress: Not on file  ?Social Connections: Not on file  ?Intimate Partner Violence: Not on file  ? ? ?Outpatient Medications Prior to Visit  ?Medication Sig Dispense Refill  ? Ascorbic Acid (VITAMIN C PO) Take by mouth.    ? aspirin 81 MG EC tablet Take 1 tablet (81 mg total) by mouth daily. 90 tablet 3  ? atorvastatin (LIPITOR) 80  MG tablet Take 1 tablet (80 mg total) by mouth daily. 90 tablet 3  ? carvedilol (COREG) 6.25 MG tablet Take 1 tablet (6.25 mg total) by mouth 2 (two) times daily. 60 tablet 11  ? cholecalciferol (VITAMIN D3) 25 MCG (1000 UNIT) tablet Take 1,000 Units by mouth daily.    ? docusate sodium (COLACE) 100 MG capsule Take 1 capsule (100 mg total) by mouth 2 (two) times daily. 10 capsule 0  ? glipiZIDE (GLUCOTROL) 10 MG tablet Take 1 tablet (10 mg total) by mouth 2 (two) times daily before a meal. 60 tablet 3  ? insulin glargine (LANTUS) 100 UNIT/ML Solostar Pen Inject 20 Units into the skin daily. 15 mL 11  ? Insulin Pen Needle 31G X 5 MM MISC Use daily at 2 PM. 100 each 11  ? Multiple Vitamins-Minerals (ZINC PO) Take by mouth.    ? polyethylene glycol (MIRALAX / GLYCOLAX) 17 g packet Take 17 g by mouth daily as needed (constipation). 14 each 0  ? sacubitril-valsartan (ENTRESTO) 49-51 MG Take 1 tablet by mouth 2 (two) times daily. 180 tablet 3  ? spironolactone (ALDACTONE) 25 MG tablet Take 1 tablet (25 mg total) by mouth daily. 30 tablet 5  ? torsemide (DEMADEX) 20 MG tablet Take 1 tablet (20 mg total) by mouth daily. (Patient taking differently: Take 20 mg by mouth as needed.) 30 tablet 3  ? TRUEplus Lancets 28G MISC 1 each by Does not apply route in the morning and at bedtime. 100 each 1  ? pantoprazole (PROTONIX) 40 MG tablet TAKE 1 TABLET (40 MG TOTAL) BY MOUTH DAILY. (Patient not taking: Reported on 09/30/2021) 30 tablet 11  ? ?No facility-administered medications prior to visit.  ? ? ?Allergies  ?Allergen Reactions  ? Hyzaar [Losartan Potassium-Hctz] Nausea And Vomiting and Cough  ?  Patient can take Sherryll Burger now  ? ? ?ROS ?Review of Systems ? ?  ?Objective:  ?  ?Physical Exam ?Constitutional:   ?   Appearance: She is obese.  ?HENT:  ?   Head: Normocephalic and atraumatic.  ?   Nose: Nose normal.  ?   Mouth/Throat:  ?   Mouth: Mucous membranes are moist.  ?Cardiovascular:  ?   Rate and Rhythm: Normal rate and regular  rhythm.  ?   Pulses: Normal pulses.  ?   Heart sounds: Normal heart sounds.  ?Pulmonary:  ?   Effort: Pulmonary effort is normal.  ?   Breath sounds: Normal breath sounds.  ?Abdominal:  ?   Palpations: Abdomen is soft.  ?   Tenderness: There is no abdominal tenderness.  ?Musculoskeletal:     ?   General: Normal range of motion.  ?   Cervical back: Normal range of motion.  ?Skin: ?   General: Skin is warm and dry.  ?   Capillary Refill: Capillary refill takes less than  2 seconds.  ?Neurological:  ?   General: No focal deficit present.  ?   Mental Status: She is alert and oriented to person, place, and time.  ?Psychiatric:     ?   Mood and Affect: Mood normal.     ?   Behavior: Behavior normal.     ?   Thought Content: Thought content normal.     ?   Judgment: Judgment normal.  ? ? ?BP 95/66 (BP Location: Left Arm, Patient Position: Sitting, Cuff Size: Normal)   Pulse 77   Temp 97.9 ?F (36.6 ?C)   Ht 5\' 5"  (1.651 m)   Wt 212 lb (96.2 kg)   SpO2 98%   BMI 35.28 kg/m?  ?Wt Readings from Last 3 Encounters:  ?10/15/21 212 lb (96.2 kg)  ?09/30/21 208 lb 12.8 oz (94.7 kg)  ?09/15/21 211 lb (95.7 kg)  ? ? ? ?Health Maintenance Due  ?Topic Date Due  ? PAP SMEAR-Modifier  Never done  ? COLONOSCOPY (Pts 45-45yrs Insurance coverage will need to be confirmed)  Never done  ? ? ?There are no preventive care reminders to display for this patient. ? ?Lab Results  ?Component Value Date  ? TSH 3.632 09/01/2021  ? ?Lab Results  ?Component Value Date  ? WBC 6.1 09/01/2021  ? HGB 13.0 09/01/2021  ? HCT 38.2 09/01/2021  ? MCV 85.8 09/01/2021  ? PLT 228 09/01/2021  ? ?Lab Results  ?Component Value Date  ? NA 132 (L) 09/01/2021  ? K 4.3 09/01/2021  ? CO2 32 09/01/2021  ? GLUCOSE 661 (HH) 09/01/2021  ? BUN 10 09/01/2021  ? CREATININE 1.13 (H) 09/01/2021  ? BILITOT 0.9 09/01/2021  ? ALKPHOS 91 09/01/2021  ? AST 19 09/01/2021  ? ALT 23 09/01/2021  ? PROT 7.3 09/01/2021  ? ALBUMIN 3.7 09/01/2021  ? CALCIUM 9.8 09/01/2021  ? ANIONGAP 13  09/01/2021  ? ?Lab Results  ?Component Value Date  ? CHOL 357 (H) 09/01/2021  ? ?Lab Results  ?Component Value Date  ? HDL 52 09/01/2021  ? ?Lab Results  ?Component Value Date  ? LDLCALC 275 (H) 09/01/2021  ?

## 2021-10-15 NOTE — Patient Instructions (Signed)

## 2021-10-18 ENCOUNTER — Other Ambulatory Visit: Payer: Self-pay

## 2021-10-19 NOTE — Telephone Encounter (Signed)
Called to touch base with the patient regarding POI requirement from [employer]. She just sent that information in this week. Has enough medication on hand until Friday. Will provide samples while we wait for a determination.  ?

## 2021-10-20 ENCOUNTER — Encounter (HOSPITAL_BASED_OUTPATIENT_CLINIC_OR_DEPARTMENT_OTHER): Payer: Self-pay | Admitting: Internal Medicine

## 2021-10-20 NOTE — Telephone Encounter (Signed)
Medication Samples have been provided to the patient. ? ?Drug name: Entresto       Strength: 49-51mg         Qty: 3 bottles  LOT: [id number]  Exp.Date: 07/2023 ? ?Dosing instructions: Take 1 tablet twice daily  ? ?The patient has been instructed regarding the correct time, dose, and frequency of taking this medication, including desired effects and most common side effects.  ? ?Maria Duncan ?1:09 PM ?10/20/2021 ? ?

## 2021-10-25 ENCOUNTER — Other Ambulatory Visit: Payer: Self-pay

## 2021-10-29 ENCOUNTER — Other Ambulatory Visit: Payer: Self-pay

## 2021-11-03 ENCOUNTER — Ambulatory Visit: Payer: Self-pay | Admitting: Nurse Practitioner

## 2021-11-03 NOTE — Telephone Encounter (Signed)
Called [employer] to check the status of the patient's application. Representative stated that they have not received the attestation form back from the patient at this time.  ? ?

## 2021-11-04 IMAGING — DX DG CHEST 1V PORT
1 series · 1 of 1 positions shown · non-contrast
Comparison: 07/23/2020

CLINICAL DATA: Hypoxic

EXAM:
PORTABLE CHEST 1 VIEW

[chest ap]
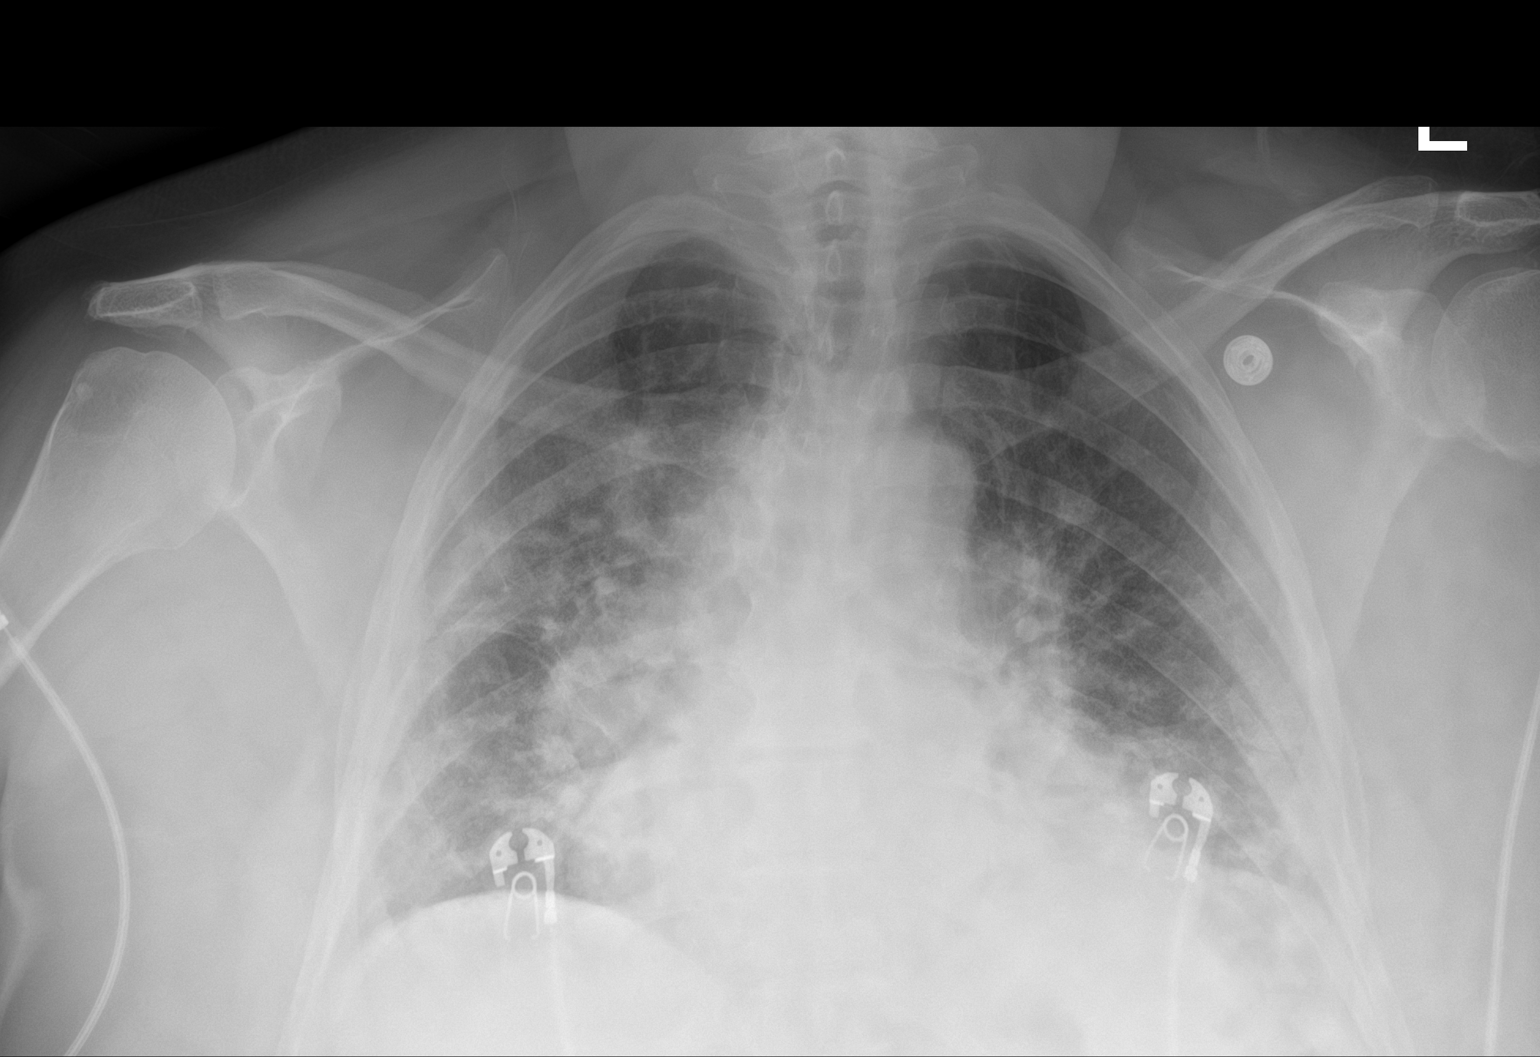

[1 of 1 positions shown; findings below may reference images not displayed]

FINDINGS: Lung volumes are small and pulmonary insufflation has slightly
diminished since prior examination. There has developed extensive
bilateral slightly asymmetric mid and lower lung zone pulmonary
infiltrates, likely infectious or inflammatory. Bilateral hilar
enlargement may represent hilar adenopathy or central pulmonary
arterial enlargement. Cardiac size is mildly enlarged, unchanged. No
acute bone abnormality.
IMPRESSION: Interval development of extensive mid and lower lung zone pulmonary
infiltrate, likely infectious in the acute setting.

Bilateral hilar enlargement representing either hilar adenopathy
versus pulmonary vascular enlargement. In

## 2021-11-05 IMAGING — DX DG CHEST 1V PORT
1 series · 1 of 1 positions shown · non-contrast
Comparison: 07/27/2020.

CLINICAL DATA: Pneumonia.  COVID.

EXAM:
PORTABLE CHEST 1 VIEW

[chest]
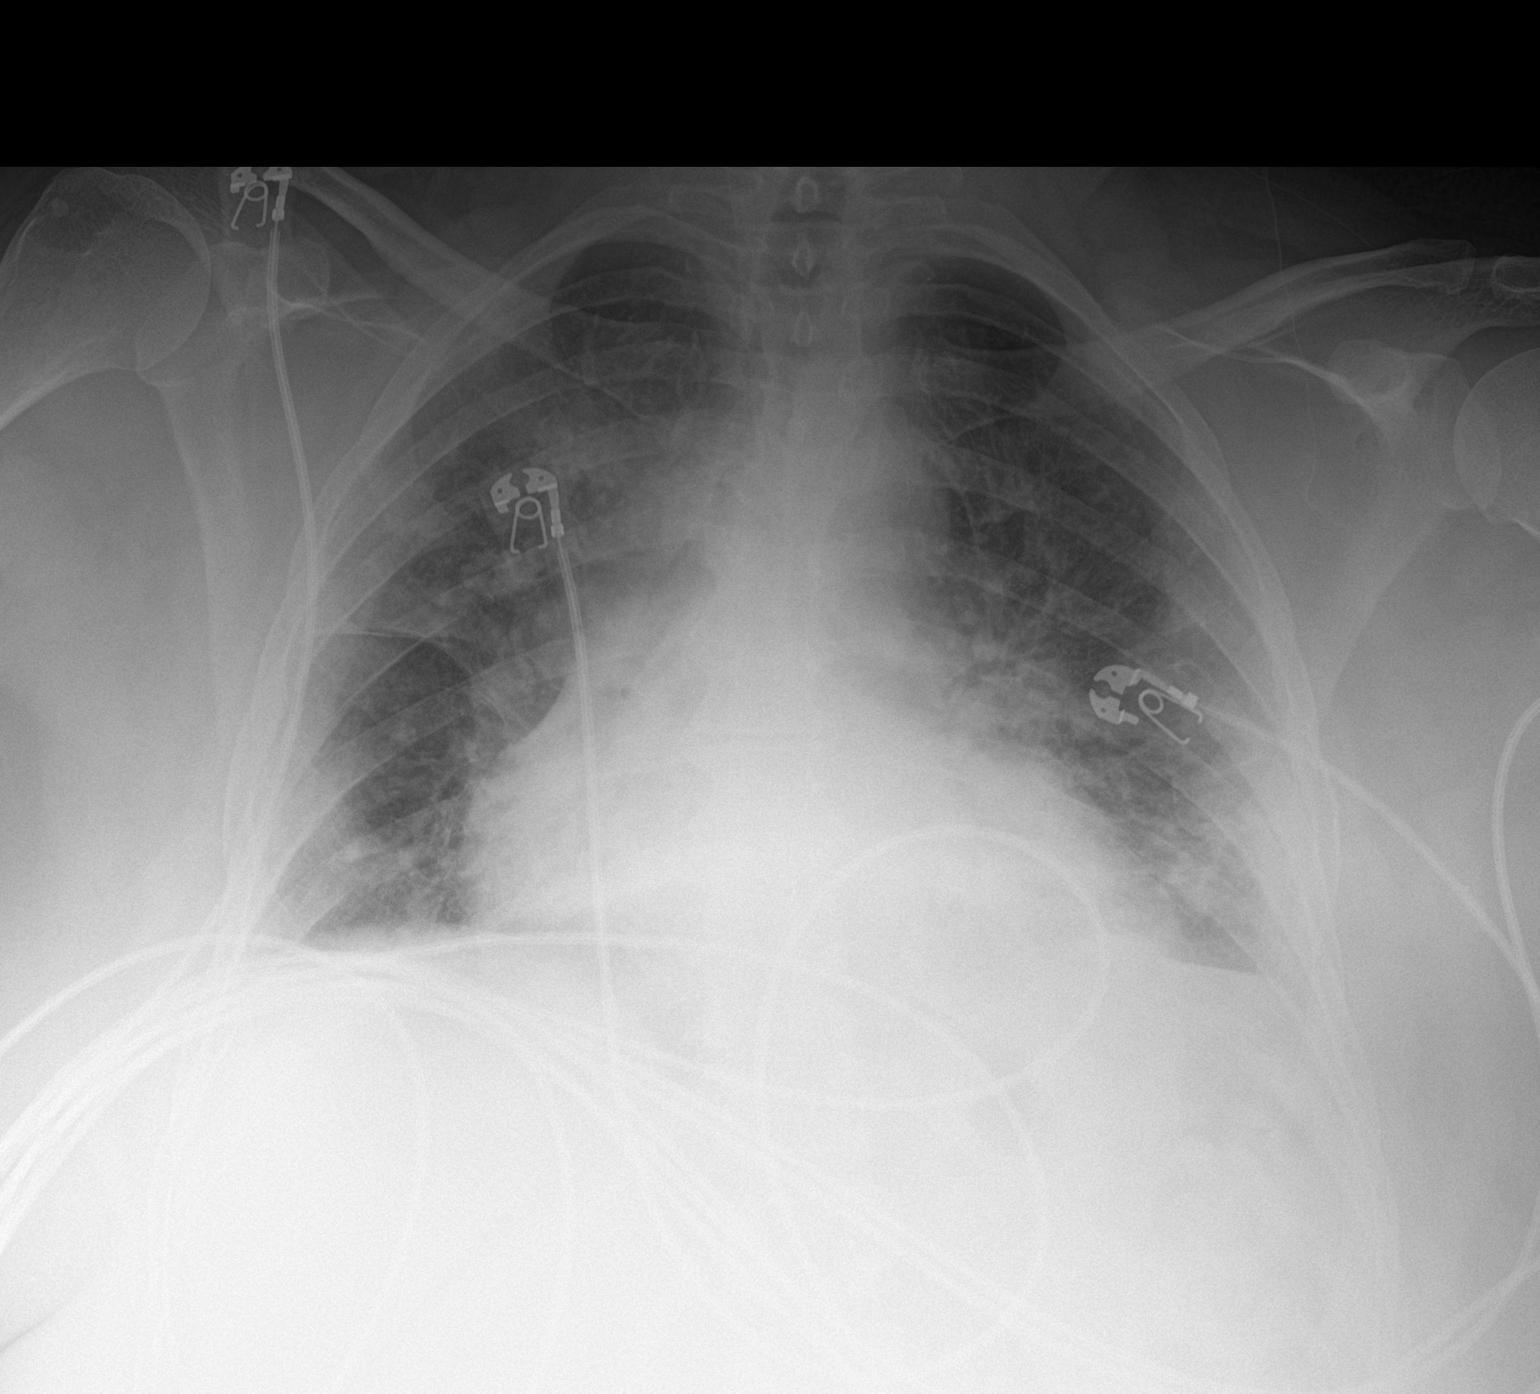

[1 of 1 positions shown; findings below may reference images not displayed]

FINDINGS: Cardiomegaly. Pulmonary vascularity/hilar structures less well
visualized on today's exam. Low lung volumes. Diffuse bilateral
pulmonary infiltrates/edema again noted with slight progression from
prior exam. No prominent pleural effusion. No pneumothorax.
IMPRESSION: 1. Cardiomegaly.
2. Low lung volumes. Diffuse bilateral pulmonary infiltrates/edema
again noted with slight progression from prior exam.

## 2021-11-10 ENCOUNTER — Ambulatory Visit: Payer: Self-pay | Admitting: Nurse Practitioner

## 2021-11-11 IMAGING — DX DG CHEST 1V PORT
1 series · 1 of 1 positions shown · non-contrast
Comparison: Three days ago

CLINICAL DATA: Pneumonia

EXAM:
PORTABLE CHEST 1 VIEW

[chest ap]
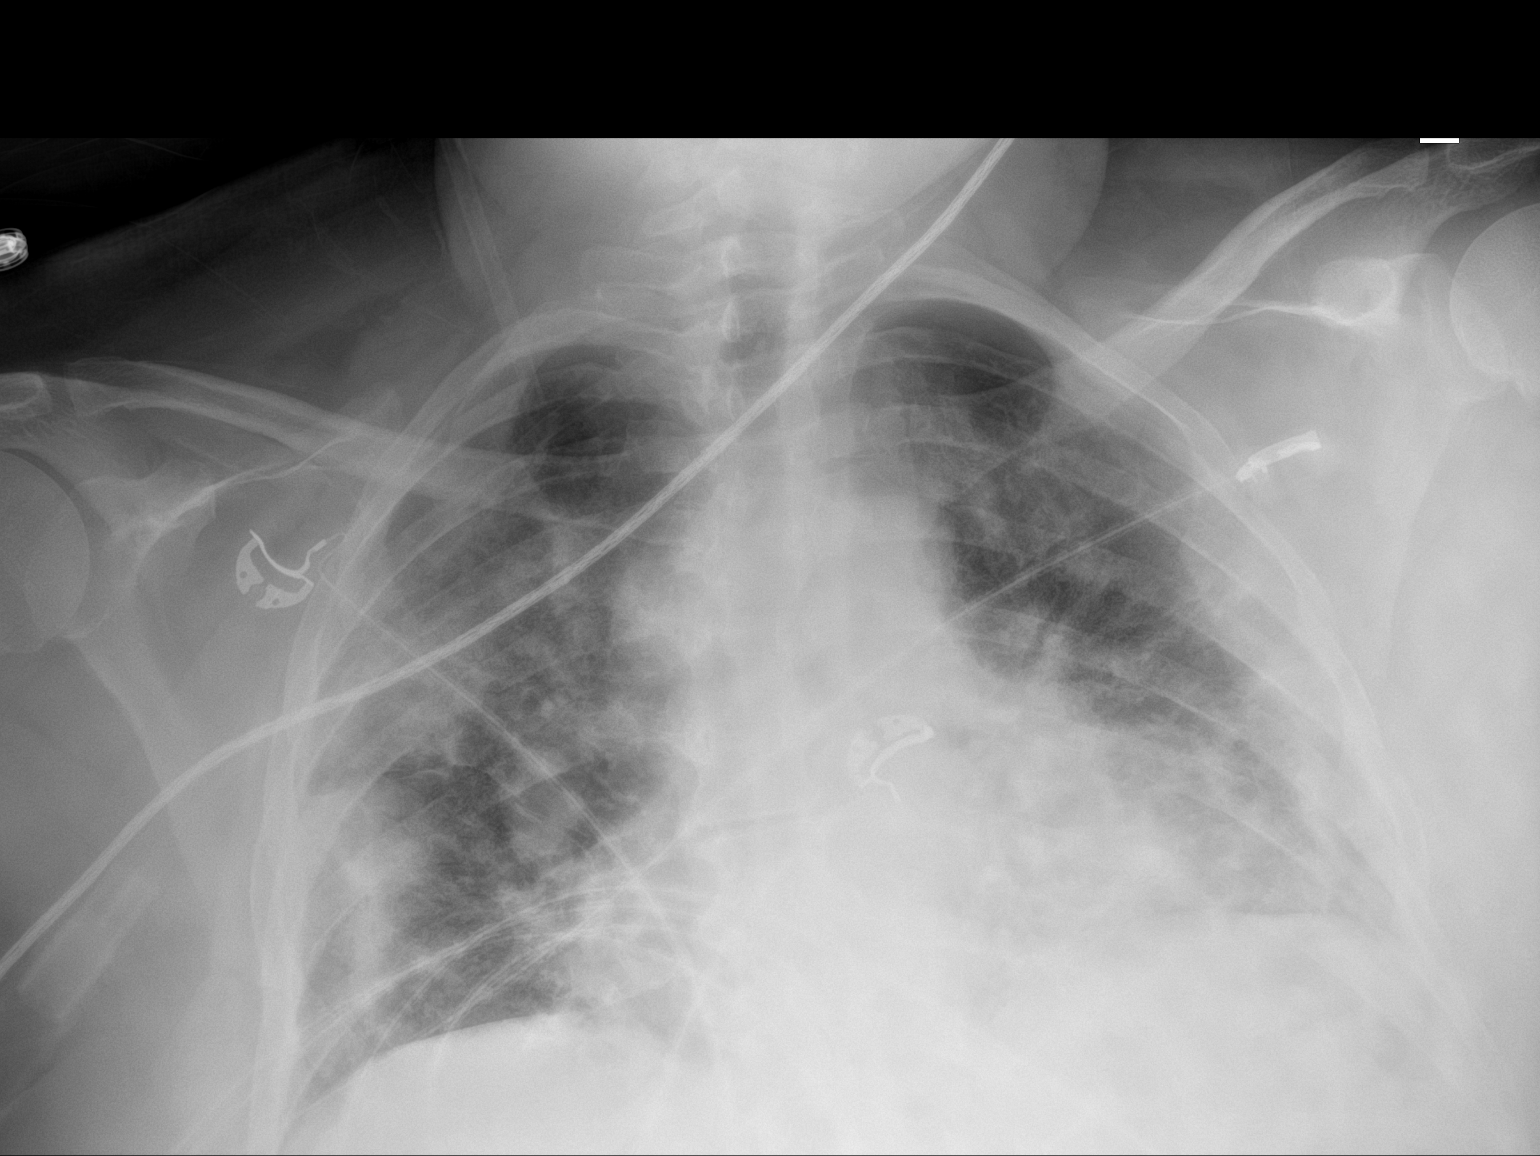

[1 of 1 positions shown; findings below may reference images not displayed]

FINDINGS: Low volume chest with patchy bilateral pneumonia. The pattern and
density is not convincingly changed from before when allowing for
differences in technique. Cardiomegaly. No visible effusion or
pneumothorax.
IMPRESSION: Similar degree of bilateral pneumonia.

## 2021-11-12 ENCOUNTER — Ambulatory Visit: Payer: Self-pay | Admitting: Nurse Practitioner

## 2021-11-17 ENCOUNTER — Ambulatory Visit: Payer: Self-pay | Admitting: Nurse Practitioner

## 2021-11-18 ENCOUNTER — Other Ambulatory Visit: Payer: Self-pay

## 2021-11-18 MED FILL — Spironolactone Tab 25 MG: ORAL | 30 days supply | Qty: 30 | Fill #1 | Status: AC

## 2021-11-19 ENCOUNTER — Other Ambulatory Visit: Payer: Self-pay

## 2021-11-22 ENCOUNTER — Other Ambulatory Visit (HOSPITAL_COMMUNITY): Payer: Self-pay | Admitting: Internal Medicine

## 2021-11-22 ENCOUNTER — Other Ambulatory Visit: Payer: Self-pay

## 2021-11-22 MED ORDER — CARVEDILOL 6.25 MG PO TABS
6.2500 mg | ORAL_TABLET | Freq: Two times a day (BID) | ORAL | 11 refills | Status: DC
  Filled 2021-11-22: qty 60, 30d supply, fill #0
  Filled 2021-12-27: qty 60, 30d supply, fill #1
  Filled 2022-01-31: qty 60, 30d supply, fill #2
  Filled 2022-03-04: qty 60, 30d supply, fill #3
  Filled 2022-04-06 – 2022-04-13 (×2): qty 60, 30d supply, fill #4
  Filled 2022-05-25: qty 60, 30d supply, fill #5
  Filled 2022-06-21 – 2022-07-01 (×2): qty 60, 30d supply, fill #6
  Filled 2022-08-24: qty 60, 30d supply, fill #7
  Filled 2022-09-26: qty 60, 30d supply, fill #8
  Filled 2022-11-14: qty 60, 30d supply, fill #9

## 2021-11-23 ENCOUNTER — Other Ambulatory Visit: Payer: Self-pay

## 2021-12-01 ENCOUNTER — Ambulatory Visit (HOSPITAL_BASED_OUTPATIENT_CLINIC_OR_DEPARTMENT_OTHER): Payer: Self-pay | Admitting: Internal Medicine

## 2021-12-16 ENCOUNTER — Other Ambulatory Visit: Payer: Self-pay

## 2021-12-16 ENCOUNTER — Other Ambulatory Visit (HOSPITAL_COMMUNITY): Payer: Self-pay

## 2021-12-16 MED FILL — Spironolactone Tab 25 MG: ORAL | 30 days supply | Qty: 30 | Fill #2 | Status: AC

## 2021-12-17 ENCOUNTER — Other Ambulatory Visit: Payer: Self-pay

## 2021-12-17 NOTE — Telephone Encounter (Signed)
Advanced Heart Failure Patient Advocate Encounter  Patient called and asked for samples. I was able to provide one bottle of samples at this time until we get an update from the patient about the income attestation letter.   Medication Samples have been provided to the patient.  Drug name: Sherryll Burger       Strength: 49-51mg         Qty: 1 bottle  LOT: [id number]  Exp.Date: 11/24  Dosing instructions: Take one tablet by mouth twice daily.   The patient has been instructed regarding the correct time, dose, and frequency of taking this medication, including desired effects and most common side effects.   Maria Duncan 3:08 PM 12/17/2021

## 2021-12-27 ENCOUNTER — Other Ambulatory Visit: Payer: Self-pay

## 2021-12-27 ENCOUNTER — Encounter (HOSPITAL_COMMUNITY): Payer: Self-pay

## 2021-12-27 NOTE — Telephone Encounter (Signed)
Advanced Heart Failure Patient Advocate Encounter  Patient called and stated that she sent the income letter back to [employer], although it was never received. Called [employer] to ask for a copy to be sent to the office. Was advised that we would have to send the request over via fax. This has been done. Waiting to receive income letter via fax.   Waylan Boga (RN) a request for samples in the meantime.

## 2021-12-27 NOTE — Progress Notes (Unsigned)
Medication Samples have been provided to the patient.  Drug name: Sherryll Burger       Strength: 49/51 mg        Qty: 2  LOT: [id number]  Exp.Date: 07/2023  Dosing instructions: Take 1 tablet Twice daily   The patient has been instructed regarding the correct time, dose, and frequency of taking this medication, including desired effects and most common side effects.   Maria Duncan 12:16 PM 12/27/2021

## 2021-12-29 ENCOUNTER — Other Ambulatory Visit: Payer: Self-pay

## 2022-01-05 ENCOUNTER — Encounter (HOSPITAL_BASED_OUTPATIENT_CLINIC_OR_DEPARTMENT_OTHER): Payer: Self-pay | Admitting: Internal Medicine

## 2022-01-10 ENCOUNTER — Telehealth (HOSPITAL_COMMUNITY): Payer: Self-pay | Admitting: Licensed Clinical Social Worker

## 2022-01-10 NOTE — Telephone Encounter (Signed)
Advanced Heart Failure Patient Advocate Encounter  Patient called and left a message regarding Entresto assistance. Have called [employer] twice regarding faxed attestation form which has not been received. [employer] today. The representative stated that the patient was sent a docusign link to complete the attestation form. I requested for that form to be sent again so that it will populate at the top of the patient's email box.   Called patient back. She stated that she realized she forgot about the link but wasn't sure what the header of the email would say. I explained what it usually looks like when a docusign email is sent out. She is going to check her email and call back if she has issues.   Archer Asa, CPhT

## 2022-01-10 NOTE — Telephone Encounter (Signed)
Pt left VM for CSW requesting call- CSW attempted to contact pt but unable to reach  Burna Sis, LCSW Clinical Social Worker [facility] Desk#: [phone] Cell#: [phone]

## 2022-01-13 NOTE — Telephone Encounter (Signed)
Advanced Heart Failure Patient Advocate Encounter   Patient was approved to receive Entresto from [employer]  Effective dates: 01/13/22 through 01/14/23  Patient called and is aware of approval. She stated that [employer] quoted 2 weeks before they would be able to ship the medication to her. [employer] who stated that the pharmacy should get the RX in the next few days and be able to schedule a shipment by Tuesday of next week. Called to update patient. She will call Monday if she hasn't heard from the pharmacy by then.   Document scanned to chart.   Archer Asa, CPhT

## 2022-01-19 ENCOUNTER — Ambulatory Visit: Payer: Self-pay | Admitting: Nurse Practitioner

## 2022-01-27 ENCOUNTER — Other Ambulatory Visit (HOSPITAL_COMMUNITY): Payer: Self-pay

## 2022-01-27 ENCOUNTER — Telehealth (HOSPITAL_COMMUNITY): Payer: Self-pay | Admitting: Pharmacy Technician

## 2022-01-27 NOTE — Telephone Encounter (Signed)
Advanced Heart Failure Patient Advocate Encounter  Patient called and stated that she tried to fill Entresto through [employer] and was told the pharmacy had to reach out to her directly when the fill was available. She only has a week of medication left. Patient asked if I would call on her behalf.  Called and spoke with [employer]. I requested a refill to be placed for her. Representative stated that she would had a fill in place to be delivered Tuesday 07/11. I asked if it could be expedited. The representative requested and expedited fill and said the patient should receive the medication between tomorrow and Tuesday.   Called and updated the patient. Advised her to call back if they do not deliver the medication on time, we will provide samples if need be. Patient appreciative of assistance.  Archer Asa, CPhT

## 2022-01-31 ENCOUNTER — Other Ambulatory Visit: Payer: Self-pay

## 2022-01-31 ENCOUNTER — Other Ambulatory Visit: Payer: Self-pay | Admitting: Internal Medicine

## 2022-01-31 ENCOUNTER — Other Ambulatory Visit (HOSPITAL_COMMUNITY): Payer: Self-pay

## 2022-02-01 ENCOUNTER — Other Ambulatory Visit: Payer: Self-pay

## 2022-02-02 ENCOUNTER — Other Ambulatory Visit: Payer: Self-pay

## 2022-02-02 MED ORDER — SPIRONOLACTONE 25 MG PO TABS
25.0000 mg | ORAL_TABLET | Freq: Every day | ORAL | 5 refills | Status: DC
  Filled 2022-02-02: qty 30, 30d supply, fill #0
  Filled 2022-03-04: qty 30, 30d supply, fill #1
  Filled 2022-04-06 – 2022-04-13 (×2): qty 30, 30d supply, fill #2
  Filled 2022-05-25: qty 30, 30d supply, fill #3
  Filled 2022-06-21 – 2022-07-01 (×2): qty 30, 30d supply, fill #4
  Filled 2022-08-24: qty 30, 30d supply, fill #5

## 2022-02-07 ENCOUNTER — Other Ambulatory Visit: Payer: Self-pay

## 2022-02-09 ENCOUNTER — Encounter (HOSPITAL_BASED_OUTPATIENT_CLINIC_OR_DEPARTMENT_OTHER): Payer: Self-pay | Admitting: Internal Medicine

## 2022-03-04 ENCOUNTER — Other Ambulatory Visit: Payer: Self-pay

## 2022-03-09 ENCOUNTER — Encounter (HOSPITAL_BASED_OUTPATIENT_CLINIC_OR_DEPARTMENT_OTHER): Payer: Self-pay | Admitting: Internal Medicine

## 2022-03-24 ENCOUNTER — Other Ambulatory Visit: Payer: Self-pay

## 2022-03-24 ENCOUNTER — Ambulatory Visit (HOSPITAL_COMMUNITY)
Admission: RE | Admit: 2022-03-24 | Discharge: 2022-03-24 | Disposition: A | Discharge status: Home / Self Care | Payer: Self-pay | Source: Ambulatory Visit | Attending: Nurse Practitioner | Admitting: Nurse Practitioner

## 2022-03-24 ENCOUNTER — Encounter: Payer: Self-pay | Admitting: Nurse Practitioner

## 2022-03-24 ENCOUNTER — Ambulatory Visit (INDEPENDENT_AMBULATORY_CARE_PROVIDER_SITE_OTHER): Payer: Self-pay | Admitting: Nurse Practitioner

## 2022-03-24 VITALS — BP 118/66 | HR 66 | Temp 97.8°F | Resp 16 | Ht 65.5 in | Wt 233.0 lb

## 2022-03-24 DIAGNOSIS — E1165 Type 2 diabetes mellitus with hyperglycemia: Secondary | ICD-10-CM | POA: Insufficient documentation

## 2022-03-24 DIAGNOSIS — G629 Polyneuropathy, unspecified: Secondary | ICD-10-CM | POA: Insufficient documentation

## 2022-03-24 LAB — POCT GLYCOSYLATED HEMOGLOBIN (HGB A1C): HbA1c, POC (controlled diabetic range): 7.3 % — AB (ref 0.0–7.0)

## 2022-03-24 MED ORDER — GABAPENTIN 100 MG PO CAPS
100.0000 mg | ORAL_CAPSULE | Freq: Three times a day (TID) | ORAL | 3 refills | Status: DC
  Filled 2022-03-24: qty 90, 30d supply, fill #0
  Filled 2022-06-21 – 2022-08-24 (×2): qty 90, 30d supply, fill #1
  Filled 2022-11-14: qty 90, 30d supply, fill #2

## 2022-03-24 NOTE — Progress Notes (Signed)
@Patient  ID: Maria Duncan, female    DOB: [birth year], 56 y.o.   MRN: [id number]  Chief Complaint  Patient presents with   Diabetes    Referring provider: Fenton Foy, NP   HPI   Maria Duncan presents for follow up. 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    Diabetes:  Patient presents today for a follow  on diabetes. She is currently on lantus and glipizide. She is complaint with medications. She does need a refill on Neurontin. Denies f/c/s, n/v/d, hemoptysis, PND, leg swelling Denies chest pain or edema      Allergies  Allergen Reactions   Hyzaar [Losartan Potassium-Hctz] Nausea And Vomiting and Cough    Patient can take Entresto now    Immunization History  Administered Date(s) Administered   Tdap 09/17/2020    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) (HCC)    Chest pain of uncertain etiology, non obstructive CAD, pain due to acute HF 12/12/2019   CHF (congestive heart failure) (HCC)    Diabetes mellitus without complication ([place])    Hyperlipidemia    Hypertension    Hypoventilation associated with obesity ([place]) 12/12/2019   Morbid obesity ([place])    NICM (nonischemic cardiomyopathy) ([place])    OSA (obstructive sleep apnea) 12/12/2019   Pulmonary hypertension, unspecified ([place]) 12/12/2019    Tobacco History: Social History   Tobacco Use  Smoking Status Never  Smokeless Tobacco Never   Counseling given: Not  Answered   Outpatient Encounter Medications as of 03/24/2022  Medication Sig   aspirin 81 MG EC tablet Take 1 tablet (81 mg total) by mouth daily.   atorvastatin (LIPITOR) 80 MG tablet Take 1 tablet (80 mg total) by mouth daily.   carvedilol (COREG) 6.25 MG tablet Take 1 tablet (6.25 mg total) by mouth 2 (two) times daily.   cholecalciferol (VITAMIN D3) 25 MCG (1000 UNIT) tablet Take 1,000 Units by mouth daily.   docusate sodium (COLACE) 100 MG capsule Take 1 capsule (100 mg total) by mouth 2 (two) times daily.   gabapentin (NEURONTIN) 100 MG capsule Take 1 capsule (100 mg total) by mouth 3 (three) times daily.   glipiZIDE (GLUCOTROL) 10 MG tablet Take 1 tablet (10 mg total) by mouth 2 (two) times daily before a meal.   insulin glargine (LANTUS) 100 UNIT/ML Solostar Pen Inject 20 Units into the skin daily.   Multiple Vitamins-Minerals (ZINC PO) Take by mouth.   polyethylene glycol (MIRALAX / GLYCOLAX) 17 g packet Take 17 g by mouth daily as needed (constipation).   spironolactone (ALDACTONE) 25 MG tablet Take 1 tablet (25 mg total) by mouth daily.   torsemide (DEMADEX) 20 MG tablet Take 1 tablet (20 mg total) by mouth daily. (Patient taking differently: Take 20 mg by mouth as needed.)   Ascorbic Acid (VITAMIN C PO) Take by mouth. (Patient not taking: Reported on 03/24/2022)   glucose blood (TRUE METRIX BLOOD GLUCOSE TEST) test strip Use as instructed   Insulin Pen Needle 31G X 5 MM MISC Use daily at 2 PM.   pantoprazole (PROTONIX) 40 MG tablet TAKE  1 TABLET (40 MG TOTAL) BY MOUTH DAILY. (Patient not taking: Reported on 09/30/2021)   sacubitril-valsartan (ENTRESTO) 49-51 MG Take 1 tablet by mouth 2 (two) times daily.   TRUEplus Lancets 28G MISC Use as directed in the morning and at bedtime.   No facility-administered encounter medications on file as of 03/24/2022.     Review of Systems  Review of Systems  Constitutional: Negative.   HENT: Negative.    Cardiovascular: Negative.    Gastrointestinal: Negative.   Allergic/Immunologic: Negative.   Neurological: Negative.   Psychiatric/Behavioral: Negative.         Physical Exam  BP 118/66 (BP Location: Right Arm, Patient Position: Sitting, Cuff Size: Large)   Pulse 66   Temp 97.8 F (36.6 C) (Oral)   Resp 16   Ht 5' 5.5" (1.664 m)   Wt 233 lb (105.7 kg)   SpO2 99%   BMI 38.18 kg/m   Wt Readings from Last 5 Encounters:  03/24/22 233 lb (105.7 kg)  10/15/21 212 lb (96.2 kg)  09/30/21 208 lb 12.8 oz (94.7 kg)  09/15/21 211 lb (95.7 kg)  09/06/21 210 lb (95.3 kg)     Physical Exam Vitals and nursing note reviewed.  Constitutional:      General: She is not in acute distress.    Appearance: She is well-developed.  Cardiovascular:     Rate and Rhythm: Normal rate and regular rhythm.  Pulmonary:     Effort: Pulmonary effort is normal.     Breath sounds: Normal breath sounds.  Neurological:     Mental Status: She is alert and oriented to person, place, and time.      Lab Results:  CBC    Component Value Date/Time   WBC 7.0 03/24/2022 1116   WBC 6.1 09/01/2021 0926   RBC 4.26 03/24/2022 1116   RBC 4.45 09/01/2021 0926   HGB 12.0 03/24/2022 1116   HCT 35.6 03/24/2022 1116   PLT 228 03/24/2022 1116   MCV 84 03/24/2022 1116   MCH 28.2 03/24/2022 1116   MCH 29.2 09/01/2021 0926   MCHC 33.7 03/24/2022 1116   MCHC 34.0 09/01/2021 0926   RDW 11.8 03/24/2022 1116   LYMPHSABS 3.1 03/24/2022 1116   MONOABS 0.6 03/20/2021 0454   EOSABS 0.2 03/24/2022 1116   BASOSABS 0.0 03/24/2022 1116    BMET    Component Value Date/Time   NA 142 03/24/2022 1116   K 4.3 03/24/2022 1116   CL 98 03/24/2022 1116   CO2 29 03/24/2022 1116   GLUCOSE 115 (H) 03/24/2022 1116   GLUCOSE 661 (HH) 09/01/2021 0926   BUN 21 03/24/2022 1116   CREATININE 0.94 03/24/2022 1116   CREATININE 0.85 03/02/2014 1603   CALCIUM 9.7 03/24/2022 1116   GFRNONAA 57 (L) 09/01/2021 0926   GFRAA 66 09/17/2020 1031    BNP     Component Value Date/Time   BNP 420.3 (H) 07/27/2020 0150    ProBNP    Component Value Date/Time   PROBNP 957 (H) 12/19/2019 1626   PROBNP 150.6 (H) 03/17/2013 1331    Imaging: DG Foot Complete Right  Result Date: 03/25/2022 CLINICAL DATA:  Twisting injury 1 month ago with persistent pain, EXAM: RIGHT FOOT COMPLETE - 3+ VIEW COMPARISON:  None Available. FINDINGS: Mild hallux valgus deformity is noted. No acute fracture or dislocation is noted. Calcaneal spurring is noted. Flattening of the plantar arch is seen. No soft tissue abnormality is noted. IMPRESSION: No acute abnormality noted. Electronically Signed   By:  Alcide Clever M.D.   On: 03/25/2022 01:30     Assessment & Plan:   Type 2 diabetes mellitus with hyperglycemia (HCC)  Lab Results  Component Value Date   HGBA1C 7.3 (A) 03/24/2022    - HgB A1c - CBC with Differential - gabapentin (NEURONTIN) 100 MG capsule; Take 1 capsule (100 mg total) by mouth 3 (three) times daily.  Dispense: 90 capsule; Refill: 3 - DG Foot Complete Right - CBC - Comprehensive metabolic panel   2. Neuropathy  - gabapentin (NEURONTIN) 100 MG capsule; Take 1 capsule (100 mg total) by mouth 3 (three) times daily.  Dispense: 90 capsule; Refill: 3 - DG Foot Complete Right    Follow up:  Follow up in 3 months     Ivonne Andrew, NP 03/25/2022

## 2022-03-24 NOTE — Patient Instructions (Addendum)
1. Type 2 diabetes mellitus with hyperglycemia, unspecified whether long term insulin use (HCC)  Lab Results  Component Value Date   HGBA1C 7.3 (A) 03/24/2022    - HgB A1c - CBC with Differential - gabapentin (NEURONTIN) 100 MG capsule; Take 1 capsule (100 mg total) by mouth 3 (three) times daily.  Dispense: 90 capsule; Refill: 3 - DG Foot Complete Right - CBC - Comprehensive metabolic panel   2. Neuropathy  - gabapentin (NEURONTIN) 100 MG capsule; Take 1 capsule (100 mg total) by mouth 3 (three) times daily.  Dispense: 90 capsule; Refill: 3 - DG Foot Complete Right    Follow up:  Follow up in 3 months

## 2022-03-24 NOTE — Progress Notes (Signed)
Feet pain Follow up DM

## 2022-03-25 ENCOUNTER — Encounter: Payer: Self-pay | Admitting: Nurse Practitioner

## 2022-03-25 ENCOUNTER — Telehealth: Payer: Self-pay | Admitting: Pharmacist

## 2022-03-25 DIAGNOSIS — E1165 Type 2 diabetes mellitus with hyperglycemia: Secondary | ICD-10-CM | POA: Insufficient documentation

## 2022-03-25 LAB — COMPREHENSIVE METABOLIC PANEL
ALT: 18 IU/L (ref 0–32)
AST: 17 IU/L (ref 0–40)
Albumin/Globulin Ratio: 1.8 (ref 1.2–2.2)
Albumin: 4.6 g/dL (ref 3.8–4.9)
Alkaline Phosphatase: 109 IU/L (ref 44–121)
BUN/Creatinine Ratio: 22 (ref 9–23)
BUN: 21 mg/dL (ref 6–24)
Bilirubin Total: 0.6 mg/dL (ref 0.0–1.2)
CO2: 29 mmol/L (ref 20–29)
Calcium: 9.7 mg/dL (ref 8.7–10.2)
Chloride: 98 mmol/L (ref 96–106)
Creatinine, Ser: 0.94 mg/dL (ref 0.57–1.00)
Globulin, Total: 2.6 g/dL (ref 1.5–4.5)
Glucose: 115 mg/dL — ABNORMAL HIGH (ref 70–99)
Potassium: 4.3 mmol/L (ref 3.5–5.2)
Sodium: 142 mmol/L (ref 134–144)
Total Protein: 7.2 g/dL (ref 6.0–8.5)
eGFR: 72 mL/min/{1.73_m2} (ref >59)

## 2022-03-25 LAB — CBC WITH DIFFERENTIAL/PLATELET
Basophils Absolute: 0 10*3/uL (ref 0.0–0.2)
Basos: 0 %
EOS (ABSOLUTE): 0.2 10*3/uL (ref 0.0–0.4)
Eos: 2 %
Hematocrit: 35.6 % (ref 34.0–46.6)
Hemoglobin: 12 g/dL (ref 11.1–15.9)
Immature Grans (Abs): 0.1 10*3/uL (ref 0.0–0.1)
Immature Granulocytes: 1 %
Lymphocytes Absolute: 3.1 10*3/uL (ref 0.7–3.1)
Lymphs: 45 %
MCH: 28.2 pg (ref 26.6–33.0)
MCHC: 33.7 g/dL (ref 31.5–35.7)
MCV: 84 fL (ref 79–97)
Monocytes Absolute: 0.5 10*3/uL (ref 0.1–0.9)
Monocytes: 7 %
Neutrophils Absolute: 3.1 10*3/uL (ref 1.4–7.0)
Neutrophils: 45 %
Platelets: 228 10*3/uL (ref 150–450)
RBC: 4.26 x10E6/uL (ref 3.77–5.28)
RDW: 11.8 % (ref 11.7–15.4)
WBC: 7 10*3/uL (ref 3.4–10.8)

## 2022-03-25 NOTE — Assessment & Plan Note (Signed)
  Lab Results  Component Value Date   HGBA1C 7.3 (A) 03/24/2022    - HgB A1c - CBC with Differential - gabapentin (NEURONTIN) 100 MG capsule; Take 1 capsule (100 mg total) by mouth 3 (three) times daily.  Dispense: 90 capsule; Refill: 3 - DG Foot Complete Right - CBC - Comprehensive metabolic panel   2. Neuropathy  - gabapentin (NEURONTIN) 100 MG capsule; Take 1 capsule (100 mg total) by mouth 3 (three) times daily.  Dispense: 90 capsule; Refill: 3 - DG Foot Complete Right    Follow up:  Follow up in 3 months

## 2022-03-25 NOTE — Progress Notes (Unsigned)
Contacted patient regarding referral for diabetes from Nichols, Tonya S, NP .   Left patient a voicemail to return my call at their convenience  Catie T. Mackynzie Woolford, PharmD, BCACP [facility] [phone]  

## 2022-03-29 ENCOUNTER — Other Ambulatory Visit: Payer: Self-pay

## 2022-03-30 NOTE — Progress Notes (Signed)
Contacted patient regarding referral for diabetes from Nichols, Tonya S, NP .   Appointment scheduled   Catie T. Quashaun Lazalde, PharmD, BCACP [facility] [phone]  

## 2022-04-06 ENCOUNTER — Other Ambulatory Visit: Payer: Self-pay | Admitting: Pharmacist

## 2022-04-06 ENCOUNTER — Other Ambulatory Visit: Payer: Self-pay

## 2022-04-06 ENCOUNTER — Other Ambulatory Visit: Payer: Self-pay | Admitting: Nurse Practitioner

## 2022-04-06 ENCOUNTER — Other Ambulatory Visit (HOSPITAL_COMMUNITY): Payer: Self-pay

## 2022-04-06 DIAGNOSIS — E1165 Type 2 diabetes mellitus with hyperglycemia: Secondary | ICD-10-CM

## 2022-04-06 DIAGNOSIS — I2510 Atherosclerotic heart disease of native coronary artery without angina pectoris: Secondary | ICD-10-CM

## 2022-04-06 DIAGNOSIS — E785 Hyperlipidemia, unspecified: Secondary | ICD-10-CM

## 2022-04-06 DIAGNOSIS — I5043 Acute on chronic combined systolic (congestive) and diastolic (congestive) heart failure: Secondary | ICD-10-CM

## 2022-04-06 MED ORDER — DAPAGLIFLOZIN PROPANEDIOL 10 MG PO TABS
10.0000 mg | ORAL_TABLET | Freq: Every day | ORAL | 3 refills | Status: DC
  Filled 2022-04-06 – 2022-06-21 (×2): qty 90, 90d supply, fill #0

## 2022-04-06 MED ORDER — METFORMIN HCL ER 500 MG PO TB24
500.0000 mg | ORAL_TABLET | Freq: Two times a day (BID) | ORAL | 2 refills | Status: DC
  Filled 2022-04-06 – 2022-04-13 (×2): qty 180, 90d supply, fill #0
  Filled 2022-08-24: qty 180, 90d supply, fill #1
  Filled 2022-12-22: qty 180, 90d supply, fill #2

## 2022-04-06 NOTE — Progress Notes (Signed)
Chief Complaint  Patient presents with   Medication Management   Diabetes    Maria Duncan is a 56 y.o. year old female who presented for a telephone visit.   They were referred to the pharmacist by their PCP for assistance in managing diabetes, hypertension, and complex medication management.    Subjective:  Care Team: Primary Care Provider: Ivonne Andrew, NP ; Next Scheduled Visit: 06/23/22  Medication Access/Adherence  Current Pharmacy:  [facility] [address] [place] [address] Phone: [phone] Fax: [phone]  [facility] [address] [place] [address] Phone: [phone] Fax: [phone]  [facility] [place], [place] - [address] [place] [address] Phone: [phone] Fax: [phone]  [id number] [place], [place] - [address] AT [facility] [address] [place] [address] Phone: [phone] Fax: [phone]  [id number] - [place], [place] - [address] AT [facility] [address] [place] [address] Phone: [phone] Fax: [phone]   Patient reports affordability concerns with their medications: No  Patient reports access/transportation concerns to their pharmacy: No  Patient reports adherence concerns with their medications:  No     Diabetes:  Current medications: Lantus 20 units daily, glipizide 10 mg twice daily  Patient has declined metformin due to "hearing bad things" in the past, including hair loss, kidney failure. History of Farxiga, yeast infection, glucose was significantly elevated at that time.   Current glucose readings: has not been checking in the past few weeks, but did just get batteries for her meter.   Patient denies hypoglycemic s/sx including dizziness, shakiness, sweating.  Patient denies hyperglycemic symptoms including polyuria, polydipsia, polyphagia, nocturia, neuropathy, blurred vision.  Current meal patterns:  - Breakfast: does a bigger breakfast on the weekends; does mini pancakes sometimes with sausage; regular meals are oatmeal, eggs;  - Lunch: salad; chicken; salmon; baked potato - Supper: meat + vegetables;  - Snacks: infrequent- but chips  - Drinks: water; very infrequent sprite;   Hyperlipidemia/ASCVD Risk Reduction  Current lipid lowering medications: atorvastatin 80 mg daily  Antiplatelet regimen: aspirin 81 mg daily  Heart Failure:  Current medications:  ACEi/ARB/ARNI: Entresto 49/51 mg twice daily SGLT2i: previously had [place], but had yeast infection Beta blocker: carvedilol 6.25 mg twice daily Mineralocorticoid Receptor Antagonist: spironolactone 25 mg daily  Diuretic regimen: torsemide 20 mg daily PRN - but reports she may need to start using daily  Patient reports volume overload signs or symptoms including lower extremity edema,   Current medication access support: Entresto patient assistance    Health Maintenance  Health Maintenance Due  Topic Date Due   COVID-19 Vaccine (1) Never done   FOOT EXAM  Never done   OPHTHALMOLOGY EXAM  Never done   PAP SMEAR-Modifier  Never done   COLONOSCOPY (Pts 45-24yrs Insurance coverage will need to be confirmed)  Never done   Zoster Vaccines- Shingrix (1 of 2) Never done   INFLUENZA VACCINE  Never done     Objective: Lab Results  Component Value Date   HGBA1C 7.3 (A) 03/24/2022    Lab Results  Component Value Date   CREATININE 0.94 03/24/2022   BUN 21 03/24/2022   NA 142 03/24/2022   K 4.3 03/24/2022   CL 98 03/24/2022   CO2 29 03/24/2022    Lab  Results  Component Value Date   CHOL 357 (H) 09/01/2021   HDL 52 09/01/2021   LDLCALC 275 (H) 09/01/2021   TRIG 148 09/01/2021   CHOLHDL 6.9 09/01/2021    Medications Reviewed Today     Reviewed by Ivonne Andrew, NP  (Nurse Practitioner) on 03/25/22 at 0801  Med List Status: <None>   Medication Order Taking? Sig Documenting Provider Last Dose Status Informant  Ascorbic Acid (VITAMIN C PO) [id number] No Take by mouth.  Patient not taking: Reported on 03/24/2022   [provider] Not Taking Active   aspirin 81 MG EC tablet [id number] Yes Take 1 tablet (81 mg total) by mouth daily. Duke Salvia, MD Taking Active Self  atorvastatin (LIPITOR) 80 MG tablet [id number] Yes Take 1 tablet (80 mg total) by mouth daily. [place], Anderson Malta, FNP Taking Active   carvedilol (COREG) 6.25 MG tablet [id number] Yes Take 1 tablet (6.25 mg total) by mouth 2 (two) times daily. Bensimhon, Bevelyn Buckles, MD Taking Active   cholecalciferol (VITAMIN D3) 25 MCG (1000 UNIT) tablet [id number] Yes Take 1,000 Units by mouth daily. [provider] Taking Active Self           Med Note Lyda Jester, Erin Sons   Fri Oct 15, 2021  1:42 PM) Vitamin D&K  docusate sodium (COLACE) 100 MG capsule [id number] Yes Take 1 capsule (100 mg total) by mouth 2 (two) times daily. Leone Brand, NP Taking Active Self  gabapentin (NEURONTIN) 100 MG capsule [id number] Yes Take 1 capsule (100 mg total) by mouth 3 (three) times daily. Ivonne Andrew, NP  Active   glipiZIDE (GLUCOTROL) 10 MG tablet [id number] Yes Take 1 tablet (10 mg total) by mouth 2 (two) times daily before a meal. Barbette Merino, NP Taking Active   glucose blood (TRUE METRIX BLOOD GLUCOSE TEST) test strip [id number]  Use as instructed Barbette Merino, NP  Active   insulin glargine (LANTUS) 100 UNIT/ML Solostar Pen [id number] Yes Inject 20 Units into the skin daily. Barbette Merino, NP Taking Active   Insulin Pen Needle 31G X 5 MM MISC [id number]  Use daily at 2 PM. Barbette Merino, NP  Active   Multiple Vitamins-Minerals (ZINC PO) [id number] Yes Take by mouth. [provider] Taking Active   pantoprazole (PROTONIX) 40 MG tablet [id number] No TAKE 1 TABLET (40 MG TOTAL) BY MOUTH DAILY.   Patient not taking: Reported on 09/30/2021   Nahser, Deloris Ping, MD Not Taking Active   polyethylene glycol (MIRALAX / GLYCOLAX) 17 g packet [id number] Yes Take 17 g by mouth daily as needed (constipation). Leone Brand, NP Taking Active   sacubitril-valsartan (ENTRESTO) 49-51 MG [id number]  Take 1 tablet by mouth 2 (two) times daily. Bensimhon, Bevelyn Buckles, MD  Active   spironolactone (ALDACTONE) 25 MG tablet [id number] Yes Take 1 tablet (25 mg total) by mouth daily. Bensimhon, Bevelyn Buckles, MD Taking Active   torsemide (DEMADEX) 20 MG tablet [id number] Yes Take 1 tablet (20 mg total) by mouth daily.  Patient taking differently: Take 20 mg by mouth as needed.   Laurey Morale, MD Taking Active   TRUEplus Lancets 28G MISC [id number]  Use as directed in the morning and at bedtime. Barbette Merino, NP  Active               Assessment/Plan:   Diabetes: - Currently uncontrolled but improved and opportunity for optimization - Reviewed long term cardiovascular and renal outcomes of uncontrolled blood sugar -  Reviewed goal A1c, goal fasting, and goal 2 hour post prandial glucose - Educated on metformin. Patient amenable to starting. Discussed with PCP, in agreement. Start metformin XR 500 mg twice daily. Stop glipizide.  Restart Farxiga 10 mg daily. Will collaborate with CPhT for assistance in application. - Recommend to check glucose twice daily, fasting and 2 hour post prandial.   Hyperlipidemia/ASCVD Risk Reduction: - Currently uncontrolled.  - Reviewed long term complications of uncontrolled cholesterol - Order lipid panel. Will follow for need to add additional therapy.    Heart Failure: - Currently appropriately managed - Reviewed dietary modifications including reducing sodium intake - Recommend to add SGLT2 as above.    Follow Up Plan: phone call in 2 weeks  Catie TClearance Coots, PharmD, [facility] [phone]

## 2022-04-06 NOTE — Patient Instructions (Signed)
Marieelena,   Stop glipizide. Start metformin XR 500 mg twice daily. This can cause diarrhea in the first week of being on it, but if you have diarrhea that persists for over a week, cut down to one tablet once daily.   Continue Lantus 20 units daily for now.   We will apply for assistance for Farxiga from the manufacturer. Talk to [place] at the pharmacy at [facility] about the application.   We will check your cholesterol next week to see if atorvastatin is doing enough to control your bad cholesterol, or LDL.   Check your blood sugars twice daily:  1) Fasting, first thing in the morning before breakfast and  2) 2 hours after your largest meal.   For a goal A1c of less than 7%, goal fasting readings are less than 130 and goal 2 hour after meal readings are less than 180.   Call me with any questions. Take care!  Catie Eppie Gibson, PharmD, [facility] [phone]

## 2022-04-06 NOTE — Progress Notes (Signed)
lipid

## 2022-04-13 ENCOUNTER — Other Ambulatory Visit: Payer: Self-pay

## 2022-04-14 ENCOUNTER — Other Ambulatory Visit: Payer: Self-pay

## 2022-04-15 ENCOUNTER — Other Ambulatory Visit: Payer: Self-pay

## 2022-04-20 ENCOUNTER — Other Ambulatory Visit: Payer: Self-pay | Admitting: Pharmacist

## 2022-04-20 NOTE — Patient Instructions (Signed)

## 2022-04-20 NOTE — Progress Notes (Signed)
Care Coordination Call  Called patient to discuss diabetes and follow up on lipid results. She had to miss her lab appointment last week and asked to reschedule. During the conversation, her husband also wanted to know if they could add on vitamin levels with the labs tomorrow to check for deficiencies. We discussed that they would need to talk to her PCP about what symptoms she may be having that prompt concerns for vitamin deficiencies prior to adding on specific lab tests. They asked for an appointment to review August lab results. Scheduled with PCP in ~2 weeks, and they will plan to check lipids then.   Patient just got new batteries in her glucometer and will start checking fasting and 2 hour post prandial readings. Scheduled a follow up call next week.   Catie Hedwig Morton, PharmD, [facility] [phone]

## 2022-04-21 ENCOUNTER — Other Ambulatory Visit: Payer: Self-pay

## 2022-04-22 ENCOUNTER — Other Ambulatory Visit: Payer: Self-pay

## 2022-04-26 ENCOUNTER — Other Ambulatory Visit: Payer: Self-pay

## 2022-04-27 ENCOUNTER — Other Ambulatory Visit: Payer: Self-pay | Admitting: Pharmacist

## 2022-04-27 ENCOUNTER — Telehealth: Payer: Self-pay | Admitting: Pharmacist

## 2022-04-27 NOTE — Progress Notes (Signed)
Attempted to contact patient for scheduled appointment for medication management. Spoke with patient's spouse.    Left HIPAA compliant message for patient to return my call at their convenience.    Catie Hedwig Morton, PharmD, [facility] [phone]

## 2022-04-28 ENCOUNTER — Other Ambulatory Visit: Payer: Self-pay

## 2022-05-09 ENCOUNTER — Ambulatory Visit: Payer: Self-pay | Admitting: Nurse Practitioner

## 2022-05-18 ENCOUNTER — Other Ambulatory Visit: Payer: Self-pay

## 2022-05-23 ENCOUNTER — Other Ambulatory Visit: Payer: Self-pay

## 2022-05-23 ENCOUNTER — Encounter: Payer: Self-pay | Admitting: Podiatry

## 2022-05-23 ENCOUNTER — Ambulatory Visit (INDEPENDENT_AMBULATORY_CARE_PROVIDER_SITE_OTHER): Payer: Self-pay

## 2022-05-23 ENCOUNTER — Ambulatory Visit (INDEPENDENT_AMBULATORY_CARE_PROVIDER_SITE_OTHER): Payer: Self-pay | Admitting: Podiatry

## 2022-05-23 DIAGNOSIS — M79672 Pain in left foot: Secondary | ICD-10-CM

## 2022-05-23 DIAGNOSIS — M76821 Posterior tibial tendinitis, right leg: Secondary | ICD-10-CM

## 2022-05-23 DIAGNOSIS — M779 Enthesopathy, unspecified: Secondary | ICD-10-CM

## 2022-05-23 DIAGNOSIS — L600 Ingrowing nail: Secondary | ICD-10-CM

## 2022-05-23 DIAGNOSIS — M79671 Pain in right foot: Secondary | ICD-10-CM

## 2022-05-23 MED ORDER — DICLOFENAC SODIUM 75 MG PO TBEC
75.0000 mg | DELAYED_RELEASE_TABLET | Freq: Two times a day (BID) | ORAL | 2 refills | Status: DC
  Filled 2022-05-23: qty 50, 25d supply, fill #0
  Filled 2022-06-21 – 2022-07-01 (×2): qty 50, 25d supply, fill #1
  Filled 2022-08-24: qty 50, 25d supply, fill #2

## 2022-05-23 MED ORDER — TRIAMCINOLONE ACETONIDE 10 MG/ML IJ SUSP
10.0000 mg | Freq: Once | INTRAMUSCULAR | Status: AC
  Administered 2022-05-23: 10 mg

## 2022-05-23 NOTE — Progress Notes (Signed)
Subjective:   Patient ID: Maria Duncan, female   DOB: 56 y.o.   MRN: [id number]   HPI Patient presents with a lot of pain in the right ankle and states that she has to stand all day at work.  It burns at times with tingling she is a diabetic but under good control has moderate discomfort left not to the same degree and is complaining about an ingrown toenail of the right big toe medial border with thickened nailbeds.  Patient does not smoke likes to be active   Review of Systems  All other systems reviewed and are negative.       Objective:  Physical Exam Vitals and nursing note reviewed.  Constitutional:      Appearance: She is well-developed.  Pulmonary:     Effort: Pulmonary effort is normal.  Musculoskeletal:        General: Normal range of motion.  Skin:    General: Skin is warm.  Neurological:     Mental Status: She is alert.     Neurovascular status intact muscle strength found to be adequate range of motion adequate with patient found to have inflammation pain of the posterior tibial tendon as it comes under the medial malleolus inserts into the navicular.  Patient does have flatfoot deformity noted bilateral that is certainly contributory and has moderate discomfort on the left also with an incurvated nail bed right hallux     Assessment:  Posterior tibial tendinitis right with inflammation along with flatfoot deformity ingrown toenail right     Plan:  H&P reviewed all conditions and I am focusing on the right I did do sterile prep I injected the sheath posterior tib 3 mg dexamethasone Kenalog 5 mg Xylocaine applied fascial brace right to lift up the arch gave instructions on supportive shoes very low at 1 point have to get an MRI if symptoms persist.  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

## 2022-05-24 ENCOUNTER — Other Ambulatory Visit: Payer: Self-pay | Admitting: Podiatry

## 2022-05-24 DIAGNOSIS — M779 Enthesopathy, unspecified: Secondary | ICD-10-CM

## 2022-05-25 ENCOUNTER — Other Ambulatory Visit: Payer: Self-pay

## 2022-06-01 ENCOUNTER — Other Ambulatory Visit: Payer: Self-pay | Admitting: Pharmacist

## 2022-06-01 NOTE — Progress Notes (Signed)
06/01/2022 Name: Maria Duncan MRN: [id number] DOB: [birth year]  Chief Complaint  Patient presents with   Medication Management   Diabetes    Maria Duncan is a 56 y.o. year old female who presented for a telephone visit.   They were referred to the pharmacist by their PCP for assistance in managing diabetes.   Subjective:  Care Team: Primary Care Provider: Ivonne Andrew, NP ; Next Scheduled Visit: 06/23/22  Medication Access/Adherence  Current Pharmacy:  [facility] - [facility] [address] [place] [address] Phone: [phone] Fax: [phone]   - [facility] [address] [place] [address] Phone: [phone] Fax: [phone]  [facility], [place] - [address] [place] [address] Phone: [phone] Fax: [phone]  [id number] [place], [place] - [address] AT [facility] [address] [place] [address] Phone: [phone] Fax: [phone]  [id number] - [place] Spice, [place] - [address] AT [facility] OF [address] [place] [address] Phone: [phone] Fax: [phone]   Patient reports affordability concerns with their medications: No  Patient reports access/transportation concerns to their pharmacy: No  Patient reports adherence concerns with their medications:  No     Diabetes:  Current medications: metformin XR 500 mg twice daily, Lantus 20 units daily, Farxiga 10 mg daily- has not heard about approval yet   Current glucose readings: ~90s   Reports she is having some stomach upset/nausea. Feels it is related to metformin.    Health Maintenance  Health Maintenance Due  Topic Date Due   COVID-19 Vaccine (1) Never done   FOOT EXAM  Never done   OPHTHALMOLOGY EXAM  Never done   Diabetic kidney  evaluation - Urine ACR  Never done   PAP SMEAR-Modifier  Never done   COLONOSCOPY (Pts 45-8yrs Insurance coverage will need to be confirmed)  Never done   Zoster Vaccines- Shingrix (1 of 2) Never done   INFLUENZA VACCINE  Never done     Objective: Lab Results  Component Value Date   HGBA1C 7.3 (A) 03/24/2022    Lab Results  Component Value Date   CREATININE 0.94 03/24/2022   BUN 21 03/24/2022   NA 142 03/24/2022   K 4.3 03/24/2022   CL 98 03/24/2022   CO2 29 03/24/2022    Lab Results  Component Value Date   CHOL 357 (H) 09/01/2021   HDL 52 09/01/2021   LDLCALC 275 (H) 09/01/2021   TRIG 148 09/01/2021   CHOLHDL 6.9 09/01/2021    Medications Reviewed Today     Reviewed by Lenn Sink, DPM (Physician) on 05/23/22 at 0907  Med List Status: <None>   Medication Order Taking? Sig Documenting Provider Last Dose Status Informant  Ascorbic Acid (VITAMIN C PO) [id number] No Take by mouth.  Patient not taking: Reported on 04/06/2022   [provider] Not Taking Active   aspirin 81 MG EC tablet [id number] No Take 1 tablet (81 mg total) by mouth daily. Duke Salvia, MD Taking Active Self  atorvastatin (LIPITOR) 80 MG tablet [id number] No Take 1 tablet (80 mg total) by mouth daily. [place], Anderson Malta, FNP Taking Active   carvedilol (COREG) 6.25 MG tablet [id number] No Take 1 tablet (6.25 mg total) by mouth 2 (two) times daily. Bensimhon, Bevelyn Buckles, MD Taking  Active   cholecalciferol (VITAMIN D3) 25 MCG (1000 UNIT) tablet [id number] No Take 1,000 Units by mouth daily. [provider] Taking Active Self           Med Note Lyda Jester, Erin Sons   Fri Oct 15, 2021  1:42 PM) Vitamin D&K  dapagliflozin propanediol (FARXIGA) 10 MG TABS tablet [id number] No Take 1 tablet (10 mg total) by mouth daily before breakfast.  Patient not taking: Reported on 04/20/2022   Ivonne Andrew, NP Not Taking Active   docusate sodium (COLACE) 100 MG capsule [id number] No Take 1 capsule (100 mg  total) by mouth 2 (two) times daily. Leone Brand, NP Taking Active Self  gabapentin (NEURONTIN) 100 MG capsule [id number] No Take 1 capsule (100 mg total) by mouth 3 (three) times daily. Ivonne Andrew, NP Taking Active   glucose blood (TRUE METRIX BLOOD GLUCOSE TEST) test strip [id number] No Use as instructed Barbette Merino, NP Taking Active   insulin glargine (LANTUS) 100 UNIT/ML Solostar Pen [id number] No Inject 20 Units into the skin daily. Barbette Merino, NP Taking Active   Insulin Pen Needle 31G X 5 MM MISC [id number] No Use daily at 2 PM. Barbette Merino, NP Taking Active   metFORMIN (GLUCOPHAGE-XR) 500 MG 24 hr tablet [id number] No Take 1 tablet (500 mg total) by mouth 2 (two) times daily with a meal. Ivonne Andrew, NP Taking Active   Multiple Vitamins-Minerals (ZINC PO) [id number] No Take by mouth.  Patient not taking: Reported on 04/06/2022   [provider] Not Taking Active   polyethylene glycol (MIRALAX / GLYCOLAX) 17 g packet [id number] No Take 17 g by mouth daily as needed (constipation).  Patient not taking: Reported on 04/06/2022   Leone Brand, NP Not Taking Active   sacubitril-valsartan (ENTRESTO) 49-51 MG [id number] No Take 1 tablet by mouth 2 (two) times daily. Bensimhon, Bevelyn Buckles, MD Taking Active   spironolactone (ALDACTONE) 25 MG tablet [id number] No Take 1 tablet (25 mg total) by mouth daily. Bensimhon, Bevelyn Buckles, MD Taking Active   torsemide (DEMADEX) 20 MG tablet [id number] No Take 1 tablet (20 mg total) by mouth daily.  Patient taking differently: Take 20 mg by mouth as needed.   Laurey Morale, MD Taking Active   TRUEplus Lancets 28G MISC [id number] No Use as directed in the morning and at bedtime. Barbette Merino, NP Taking Active               Assessment/Plan:   Diabetes: - Currently uncontrolled - Recommend to trial metformin at once daily to see if improvement in GI upset.  - Contacted [place] patient assistance. Patient was denied on 04/25/22 as  patient documented no income, so she appears to meet income requirements for Medicaid. Attempted to contact the patient to discuss, but it went to voicemail. Will follow up.    Follow Up Plan: patient to see PCP on 06/23/22  Catie Eppie Gibson, PharmD, [facility] [phone]

## 2022-06-08 ENCOUNTER — Ambulatory Visit: Payer: Self-pay | Admitting: Podiatry

## 2022-06-10 ENCOUNTER — Ambulatory Visit: Payer: Self-pay | Admitting: Podiatry

## 2022-06-17 ENCOUNTER — Other Ambulatory Visit: Payer: Self-pay

## 2022-06-20 ENCOUNTER — Other Ambulatory Visit: Payer: Self-pay

## 2022-06-21 ENCOUNTER — Other Ambulatory Visit: Payer: Self-pay

## 2022-06-21 ENCOUNTER — Other Ambulatory Visit (HOSPITAL_COMMUNITY): Payer: Self-pay | Admitting: Cardiology

## 2022-06-21 NOTE — Telephone Encounter (Signed)
Hf clinic graduate

## 2022-06-22 ENCOUNTER — Ambulatory Visit: Payer: Self-pay | Admitting: Podiatry

## 2022-06-22 ENCOUNTER — Other Ambulatory Visit (HOSPITAL_COMMUNITY): Payer: Self-pay

## 2022-06-22 MED ORDER — TORSEMIDE 20 MG PO TABS
20.0000 mg | ORAL_TABLET | Freq: Every day | ORAL | 3 refills | Status: DC
  Filled 2022-06-22 – 2022-08-24 (×2): qty 30, 30d supply, fill #0

## 2022-06-22 NOTE — Telephone Encounter (Signed)
This is a CHF pt. Last seen 08/2021, pt has not established with a general Cardiologist yet. Can CHF refill this medication until pt is established? Please address

## 2022-06-23 ENCOUNTER — Ambulatory Visit: Payer: Self-pay | Admitting: Nurse Practitioner

## 2022-06-28 ENCOUNTER — Other Ambulatory Visit: Payer: Self-pay

## 2022-07-01 ENCOUNTER — Other Ambulatory Visit (HOSPITAL_COMMUNITY): Payer: Self-pay

## 2022-07-01 ENCOUNTER — Other Ambulatory Visit: Payer: Self-pay

## 2022-07-04 ENCOUNTER — Other Ambulatory Visit: Payer: Self-pay

## 2022-07-06 ENCOUNTER — Other Ambulatory Visit: Payer: Self-pay

## 2022-07-14 ENCOUNTER — Other Ambulatory Visit: Payer: Self-pay

## 2022-07-20 ENCOUNTER — Other Ambulatory Visit: Payer: Self-pay

## 2022-07-22 ENCOUNTER — Other Ambulatory Visit: Payer: Self-pay

## 2022-08-18 ENCOUNTER — Ambulatory Visit: Payer: Self-pay | Admitting: Nurse Practitioner

## 2022-08-19 ENCOUNTER — Encounter: Payer: Self-pay | Admitting: Pharmacist

## 2022-08-24 ENCOUNTER — Other Ambulatory Visit: Payer: Self-pay

## 2022-08-25 ENCOUNTER — Other Ambulatory Visit: Payer: Self-pay

## 2022-08-25 ENCOUNTER — Ambulatory Visit: Payer: Self-pay | Admitting: Nurse Practitioner

## 2022-08-31 ENCOUNTER — Other Ambulatory Visit: Payer: Self-pay

## 2022-09-07 ENCOUNTER — Other Ambulatory Visit: Payer: Self-pay | Admitting: Nurse Practitioner

## 2022-09-07 ENCOUNTER — Encounter: Payer: Self-pay | Admitting: Nurse Practitioner

## 2022-09-07 ENCOUNTER — Ambulatory Visit (INDEPENDENT_AMBULATORY_CARE_PROVIDER_SITE_OTHER): Payer: Self-pay | Admitting: Nurse Practitioner

## 2022-09-07 VITALS — BP 110/62 | HR 66 | Temp 97.2°F | Wt 232.2 lb

## 2022-09-07 DIAGNOSIS — Z13220 Encounter for screening for lipoid disorders: Secondary | ICD-10-CM

## 2022-09-07 DIAGNOSIS — R252 Cramp and spasm: Secondary | ICD-10-CM

## 2022-09-07 DIAGNOSIS — G4730 Sleep apnea, unspecified: Secondary | ICD-10-CM

## 2022-09-07 DIAGNOSIS — E1165 Type 2 diabetes mellitus with hyperglycemia: Secondary | ICD-10-CM

## 2022-09-07 LAB — POCT GLYCOSYLATED HEMOGLOBIN (HGB A1C): Hemoglobin A1C: 11.3 % — AB (ref 4.0–5.6)

## 2022-09-07 NOTE — Progress Notes (Signed)
$@Patientw$  ID: Maria Duncan, female    DOB: 1966/05/01, 57 y.o.   MRN: [id number]  Chief Complaint  Patient presents with   Diabetes    Follow up and need another order for sleep study    Referring provider: Fenton Foy, NP   HPI  Maria Duncan presents for follow up. 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    Diabetes:   Patient presents today for a follow  on diabetes. She is currently on lantus and metformin. She is non-complaint with medications.  Patient's husband is urging her to stop taking all of her medications and start her on a naturopathic course.  We discussed that there are naturopathic clinics and check into that if he wants to.  We discussed that her A1c is high at 11.7 today and that we would strongly recommend she stay on her medications as directed.  She is not currently taking [place] as directed.  She is starting and stopping medications as she wishes.  We will have the pharmacist to follow-up with her on her medication regimen.  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) (HCC)    Chest pain of uncertain etiology, non obstructive  CAD, pain due to acute HF 12/12/2019   CHF (congestive heart failure) (HCC)    Diabetes mellitus without complication ([place])    Hyperlipidemia    Hypertension    Hypoventilation associated with obesity ([place]) 12/12/2019   Morbid obesity ([place])    NICM (nonischemic cardiomyopathy) ([place])    OSA (obstructive sleep apnea) 12/12/2019   Pulmonary hypertension, unspecified ([place]) 12/12/2019    Tobacco History: Social History   Tobacco Use  Smoking Status Never  Smokeless Tobacco Never   Counseling given: Not Answered   Outpatient Encounter Medications as of 09/07/2022  Medication Sig   aspirin 81 MG EC tablet Take 1 tablet (81 mg total) by mouth daily.   atorvastatin (LIPITOR) 80 MG tablet Take 1 tablet (80 mg total) by mouth daily.   carvedilol (COREG) 6.25 MG tablet Take 1 tablet (6.25 mg total) by mouth 2 (two) times daily.   cholecalciferol (VITAMIN D3) 25 MCG (1000 UNIT) tablet Take 1,000 Units by mouth daily.   diclofenac (VOLTAREN) 75 MG EC tablet Take 1 tablet (75 mg total) by mouth 2 (two) times daily.   docusate sodium (COLACE) 100 MG capsule Take 1 capsule (100 mg total) by mouth 2 (two) times daily.   glucose blood (TRUE METRIX BLOOD GLUCOSE TEST) test strip Use as instructed   insulin glargine (LANTUS) 100 UNIT/ML Solostar Pen Inject 20 Units into the skin daily.   Insulin Pen Needle 31G X 5 MM MISC Use daily at 2 PM.   metFORMIN (GLUCOPHAGE-XR) 500 MG 24 hr tablet Take 1 tablet (500 mg  total) by mouth 2 (two) times daily with a meal.   Multiple Vitamins-Minerals (ZINC PO) Take by mouth.   polyethylene glycol (MIRALAX / GLYCOLAX) 17 g packet Take 17 g by mouth daily as needed (constipation).   sacubitril-valsartan (ENTRESTO) 49-51 MG Take 1 tablet by mouth 2 (two) times daily.   spironolactone (ALDACTONE) 25 MG tablet Take 1 tablet (25 mg total) by mouth daily.   torsemide (DEMADEX) 20 MG tablet Take 1 tablet (20 mg total) by mouth daily. Please call to schedule follow up for  any additional refills [phone] thanks   TRUEplus Lancets 28G MISC Use as directed in the morning and at bedtime.   Ascorbic Acid (VITAMIN C PO) Take by mouth. (Patient not taking: Reported on 04/06/2022)   dapagliflozin propanediol (FARXIGA) 10 MG TABS tablet Take 1 tablet (10 mg total) by mouth daily before breakfast. (Patient not taking: Reported on 04/20/2022)   gabapentin (NEURONTIN) 100 MG capsule Take 1 capsule (100 mg total) by mouth 3 (three) times daily. (Patient not taking: Reported on 09/07/2022)   No facility-administered encounter medications on file as of 09/07/2022.     Review of Systems  Review of Systems  Constitutional: Negative.   HENT: Negative.    Cardiovascular: Negative.   Gastrointestinal: Negative.   Allergic/Immunologic: Negative.   Neurological: Negative.   Psychiatric/Behavioral: Negative.         Physical Exam  BP 110/62   Pulse 66   Temp (!) 97.2 F (36.2 C)   Wt 232 lb 3.2 oz (105.3 kg)   SpO2 99%   BMI 38.05 kg/m   Wt Readings from Last 5 Encounters:  09/07/22 232 lb 3.2 oz (105.3 kg)  03/24/22 233 lb (105.7 kg)  10/15/21 212 lb (96.2 kg)  09/30/21 208 lb 12.8 oz (94.7 kg)  09/15/21 211 lb (95.7 kg)     Physical Exam Vitals and nursing note reviewed.  Constitutional:      General: She is not in acute distress.    Appearance: She is well-developed.  Cardiovascular:     Rate and Rhythm: Normal rate and regular rhythm.  Pulmonary:     Effort: Pulmonary effort is normal.     Breath sounds: Normal breath sounds.  Neurological:     Mental Status: She is alert and oriented to person, place, and time.      Lab Results:  CBC    Component Value Date/Time   WBC 7.0 03/24/2022 1116   WBC 6.1 09/01/2021 0926   RBC 4.26 03/24/2022 1116   RBC 4.45 09/01/2021 0926   HGB 12.0 03/24/2022 1116   HCT 35.6 03/24/2022 1116   PLT 228 03/24/2022 1116   MCV 84 03/24/2022 1116   MCH 28.2 03/24/2022 1116   MCH 29.2 09/01/2021 0926    MCHC 33.7 03/24/2022 1116   MCHC 34.0 09/01/2021 0926   RDW 11.8 03/24/2022 1116   LYMPHSABS 3.1 03/24/2022 1116   MONOABS 0.6 03/20/2021 0454   EOSABS 0.2 03/24/2022 1116   BASOSABS 0.0 03/24/2022 1116    BMET    Component Value Date/Time   NA 142 03/24/2022 1116   K 4.3 03/24/2022 1116   CL 98 03/24/2022 1116   CO2 29 03/24/2022 1116   GLUCOSE 115 (H) 03/24/2022 1116   GLUCOSE 661 (HH) 09/01/2021 0926   BUN 21 03/24/2022 1116   CREATININE 0.94 03/24/2022 1116   CREATININE 0.85 03/02/2014 1603   CALCIUM 9.7 03/24/2022 1116   GFRNONAA 57 (L) 09/01/2021 0926   GFRAA 66  09/17/2020 1031    BNP    Component Value Date/Time   BNP 420.3 (H) 07/27/2020 0150    ProBNP    Component Value Date/Time   PROBNP 957 (H) 12/19/2019 1626   PROBNP 150.6 (H) 03/17/2013 1331    Imaging: No results found.   Assessment & Plan:   Type 2 diabetes mellitus with hyperglycemia (HCC) - Microalbumin/Creatinine Ratio, Urine - POCT glycosylated hemoglobin (Hb A1C) - Ambulatory referral to Podiatry - Vitamin B12 - CBC - Comprehensive metabolic panel - Magnesium - Vitamin D, 25-hydroxy  2. Leg cramps  - Vitamin B12 - Magnesium - Vitamin D, 25-hydroxy   Follow up:  Follow up in 3 months     Fenton Foy, NP 09/07/2022

## 2022-09-07 NOTE — Patient Instructions (Signed)
1. Type 2 diabetes mellitus with hyperglycemia, unspecified whether long term insulin use (HCC)  - Microalbumin/Creatinine Ratio, Urine - POCT glycosylated hemoglobin (Hb A1C) - Ambulatory referral to Podiatry - Vitamin B12 - CBC - Comprehensive metabolic panel - Magnesium - Vitamin D, 25-hydroxy  2. Leg cramps  - Vitamin B12 - Magnesium - Vitamin D, 25-hydroxy   Follow up:  Follow up in 3 months

## 2022-09-07 NOTE — Addendum Note (Signed)
Addended by: Elyse Jarvis on: 09/07/2022 09:45 AM   Modules accepted: Orders

## 2022-09-07 NOTE — Addendum Note (Signed)
Addended by: Elyse Jarvis on: 09/07/2022 09:49 AM   Modules accepted: Orders

## 2022-09-07 NOTE — Addendum Note (Signed)
Addended by: Elyse Jarvis on: 09/07/2022 10:04 AM   Modules accepted: Orders

## 2022-09-07 NOTE — Assessment & Plan Note (Signed)
-   Microalbumin/Creatinine Ratio, Urine - POCT glycosylated hemoglobin (Hb A1C) - Ambulatory referral to Podiatry - Vitamin B12 - CBC - Comprehensive metabolic panel - Magnesium - Vitamin D, 25-hydroxy  2. Leg cramps  - Vitamin B12 - Magnesium - Vitamin D, 25-hydroxy   Follow up:  Follow up in 3 months

## 2022-09-08 LAB — COMPREHENSIVE METABOLIC PANEL
ALT: 12 IU/L (ref 0–32)
AST: 11 IU/L (ref 0–40)
Albumin/Globulin Ratio: 1.8 (ref 1.2–2.2)
Albumin: 4.2 g/dL (ref 3.8–4.9)
Alkaline Phosphatase: 109 IU/L (ref 44–121)
BUN/Creatinine Ratio: 21 (ref 9–23)
BUN: 22 mg/dL (ref 6–24)
Bilirubin Total: 0.5 mg/dL (ref 0.0–1.2)
CO2: 28 mmol/L (ref 20–29)
Calcium: 9.9 mg/dL (ref 8.7–10.2)
Chloride: 96 mmol/L (ref 96–106)
Creatinine, Ser: 1.07 mg/dL — ABNORMAL HIGH (ref 0.57–1.00)
Globulin, Total: 2.4 g/dL (ref 1.5–4.5)
Glucose: 380 mg/dL — ABNORMAL HIGH (ref 70–99)
Potassium: 5.3 mmol/L — ABNORMAL HIGH (ref 3.5–5.2)
Sodium: 138 mmol/L (ref 134–144)
Total Protein: 6.6 g/dL (ref 6.0–8.5)
eGFR: 61 mL/min/{1.73_m2} (ref >59)

## 2022-09-08 LAB — LIPID PANEL
Chol/HDL Ratio: 2.7 ratio (ref 0.0–4.4)
Cholesterol, Total: 167 mg/dL (ref 100–199)
HDL: 62 mg/dL (ref >39)
LDL Chol Calc (NIH): 92 mg/dL (ref 0–99)
Triglycerides: 65 mg/dL (ref 0–149)
VLDL Cholesterol Cal: 13 mg/dL (ref 5–40)

## 2022-09-08 LAB — CBC
Hematocrit: 36.7 % (ref 34.0–46.6)
Hemoglobin: 12 g/dL (ref 11.1–15.9)
MCH: 28.8 pg (ref 26.6–33.0)
MCHC: 32.7 g/dL (ref 31.5–35.7)
MCV: 88 fL (ref 79–97)
Platelets: 218 10*3/uL (ref 150–450)
RBC: 4.16 x10E6/uL (ref 3.77–5.28)
RDW: 11.7 % (ref 11.7–15.4)
WBC: 6.6 10*3/uL (ref 3.4–10.8)

## 2022-09-08 LAB — MICROALBUMIN / CREATININE URINE RATIO
Creatinine, Urine: 169.1 mg/dL
Microalb/Creat Ratio: 46 mg/g creat — ABNORMAL HIGH (ref 0–29)
Microalbumin, Urine: 77.4 ug/mL

## 2022-09-08 LAB — MAGNESIUM: Magnesium: 1.6 mg/dL (ref 1.6–2.3)

## 2022-09-08 LAB — VITAMIN B12: Vitamin B-12: 581 pg/mL (ref 232–1245)

## 2022-09-08 LAB — VITAMIN D 25 HYDROXY (VIT D DEFICIENCY, FRACTURES): Vit D, 25-Hydroxy: 17.7 ng/mL — ABNORMAL LOW (ref 30.0–100.0)

## 2022-09-14 ENCOUNTER — Other Ambulatory Visit: Payer: Self-pay | Admitting: Pharmacist

## 2022-09-14 ENCOUNTER — Other Ambulatory Visit: Payer: Self-pay

## 2022-09-14 MED ORDER — FLUCONAZOLE 150 MG PO TABS
150.0000 mg | ORAL_TABLET | Freq: Once | ORAL | 0 refills | Status: DC
  Filled 2022-09-14: qty 1, 1d supply, fill #0

## 2022-09-14 NOTE — Progress Notes (Signed)
09/14/2022 Name: Maria Duncan MRN: [id number] DOB: February 27, 1966  Chief Complaint  Patient presents with   Medication Management   Diabetes    CLO ALDI is a 57 y.o. year old female who presented for a telephone visit.   They were referred to the pharmacist by their PCP for assistance in managing diabetes.   Subjective:  Care Team: Primary Care Provider: Fenton Foy, NP ; Next Scheduled Visit: 12/14/22  Medication Access/Adherence  Current Pharmacy:  [facility] [address], [place] [address] Phone: [phone] Fax: [place] [address] [place] [address] Phone: [phone] Fax: [phone]  [facility] [place], [place] [address] [place] [address] Phone: [phone] Fax: [phone]  [id number] [id number] [id number]: [phone]: [facility]#[id number]- [address] [facility] [place] [address]Phone: [phone]: [phone]  Patient reports affordability concerns with their medications: No  Patient reports access/transportation concerns to their pharmacy: No  Patient reports adherence concerns with their medications:  No     Diabetes:  Current medications: metformin XR 1000 mg twice daily, Lantus 24 units daily Medications tried in the past: never started FIran but reporting periodic yeast infections at this time  Current glucose readings: fasting: 200s-270s  Patient denies hypoglycemic s/sx including dizziness, shakiness, sweating. Patient denies hyperglycemic symptoms including polyuria, polydipsia, polyphagia, nocturia, neuropathy, blurred vision.  Current  meal patterns: notes she wants to lean into natural remedies   Requests fluconazole, reports symptoms of a yeast infection - burning, itching  Hyperlipidemia/ASCVD Risk Reduction  Current lipid lowering medications: atorvastatin 80 mg daily  Antiplatelet regimen: aspirin 81 mg daily   Heart Failure:  Current medications:  ACEi/ARB/ARNI: Entresto 49/51 mg twice daily SGLT2i: none Beta blocker: carvedilol 6.25 mg twice daily Mineralocorticoid Receptor Antagonist: spironolactone 25 mg daily Diuretic regimen: torsemide 20 mg daily - though reports she has significant cramps when she takes this, so only uses PRN   Objective:  Lab Results  Component Value Date   HGBA1C 11.3 (A) 09/07/2022    Lab Results  Component Value Date   CREATININE 1.07 (H) 09/07/2022   BUN 22 09/07/2022   NA 138 09/07/2022   K 5.3 (H) 09/07/2022   CL 96 09/07/2022   CO2 28 09/07/2022    Lab Results  Component Value Date   CHOL 167 09/07/2022   HDL 62 09/07/2022   LDLCALC 92 09/07/2022   TRIG 65 09/07/2022   CHOLHDL 2.7 09/07/2022    Medications Reviewed Today     Reviewed by HOsker Mason RPH-CPP (Pharmacist) on 09/14/22 at [phone] Med List Status: <None>   Medication Order Taking? Sig Documenting Provider Last Dose Status Informant  Ascorbic Acid (VITAMIN C PO) [id number] Take by mouth. [provider]  Active   aspirin 81 MG EC tablet [id number] Take 1 tablet (81 mg total) by mouth daily. KDeboraha Sprang MD Taking Active Self  atorvastatin (LIPITOR) 80 MG tablet [id number] Take 1 tablet (80 mg total) by mouth daily. [place] F[place] CarolinaTaking Active   carvedilol (COREG) 6.25 MG tablet [id number] Take 1 tablet (6.25  mg total) by mouth 2 (two) times daily. Bensimhon, Shaune Pascal, MD Taking Active   cholecalciferol (VITAMIN D3) 25 MCG (1000 UNIT) tablet [id number]  Take 1,000 Units by mouth daily. [provider]  Active Self           Med Note Vicente Serene, Donzetta Starch    Fri Oct 15, 2021  1:42 PM) Vitamin D&K  dapagliflozin propanediol (FARXIGA) 10 MG TABS tablet [id number] No Take 1 tablet (10 mg total) by mouth daily before breakfast.  Patient not taking: Reported on 04/20/2022   Fenton Foy, NP Not Taking Active   diclofenac (VOLTAREN) 75 MG EC tablet [id number] Yes Take 1 tablet (75 mg total) by mouth 2 (two) times daily. Wallene Huh, DPM Taking Active   docusate sodium (COLACE) 100 MG capsule [id number] Yes Take 1 capsule (100 mg total) by mouth 2 (two) times daily. Isaiah Serge, NP Taking Active Self  gabapentin (NEURONTIN) 100 MG capsule [id number] Yes Take 1 capsule (100 mg total) by mouth 3 (three) times daily. Fenton Foy, NP Taking Active   glucose blood (TRUE METRIX BLOOD GLUCOSE TEST) test strip [id number] Yes Use as instructed Vevelyn Francois, NP Taking Active   insulin glargine (LANTUS) 100 UNIT/ML Solostar Pen [id number] Yes Inject 20 Units into the skin daily. Vevelyn Francois, NP Taking Active            Med Note Jodi Mourning, [place] Sep 14, 2022  9:39 AM) 24 units  Insulin Pen Needle 31G X 5 MM MISC [id number]  Use daily at 2 PM. Vevelyn Francois, NP  Active   metFORMIN (GLUCOPHAGE-XR) 500 MG 24 hr tablet [id number] Yes Take 1 tablet (500 mg total) by mouth 2 (two) times daily with a meal. Fenton Foy, NP Taking Active   Multiple Vitamins-Minerals (ZINC PO) [id number]  Take by mouth. [provider]  Active   polyethylene glycol (MIRALAX / GLYCOLAX) 17 g packet [id number]  Take 17 g by mouth daily as needed (constipation). Isaiah Serge, NP  Active            Med Note Elyse Jarvis   Wed Sep 07, 2022  8:33 AM) Prn   sacubitril-valsartan [facility]) 49-51 MG [id number] Yes Take 1 tablet by mouth 2 (two) times daily. Bensimhon, Shaune Pascal, MD Taking Active   spironolactone (ALDACTONE) 25 MG tablet [id number] Yes Take 1 tablet (25 mg total) by mouth daily. Bensimhon, Shaune Pascal, MD Taking Active   torsemide (DEMADEX) 20 MG  tablet [id number] Yes Take 1 tablet (20 mg total) by mouth daily. Please call to schedule follow up for any additional refills [phone] thanks Larey Dresser, MD Taking Active            Med Note Jodi Mourning, [place] Sep 14, 2022  9:42 AM) PRN  TRUEplus Lancets 28G MISC [id number]  Use as directed in the morning and at bedtime. Vevelyn Francois, NP  Active               Assessment/Plan:   Diabetes: - Currently uncontrolled - Reviewed long term cardiovascular and renal outcomes of uncontrolled blood sugar - Reviewed goal A1c, goal fasting, and goal 2 hour post prandial glucose - Reviewed dietary modifications including: discussion regarding principals of mediterranean diet - Reviewed lifestyle modifications including: focus on increasing physical activity - Recommend to increase Lantus to 28 units daily. Patient declines, requests 2 weeks on current regimen prior to changes.  -  Recommend to check glucose twice daily, fasting and 2 hour post prandial - recommend to continue metformin and Lantus at current doses - Discussed symptoms of yeast infection with PCP, Order placed for fluconazole x 1.    Hyperlipidemia/ASCVD Risk Reduction: - Currently uncontrolled but significantly improved.  - Recommend to praised for significant improvement in LDL.  - Recommend to continue current regimen at this time  Heart Failure: - Currently appropriately managed - Encouraged to schedule follow up with HF clinic. Phone number and address provided.     Follow Up Plan: phone call in 2 weeks  Catie Hedwig Morton, PharmD, [place], [facility] [phone]

## 2022-09-14 NOTE — Patient Instructions (Signed)
K[place],   It was great talking to you!  Check your blood sugars twice daily:  1) Fasting, first thing in the morning before breakfast and  2) 2 hours after your largest meal.   For a goal A1c of less than 7%, goal fasting readings are less than 130 and goal 2 hour after meal readings are less than 180.    Call Dr. Clayborne Dana office to schedule follow up.   Take care,   Catie Hedwig Morton, PharmD, [place], [facility] [phone]

## 2022-09-19 ENCOUNTER — Other Ambulatory Visit: Payer: Self-pay

## 2022-09-19 NOTE — Progress Notes (Signed)
Called pt and inform results.

## 2022-09-20 ENCOUNTER — Other Ambulatory Visit: Payer: Self-pay

## 2022-09-21 ENCOUNTER — Encounter: Payer: Self-pay | Admitting: Adult Health

## 2022-09-21 ENCOUNTER — Ambulatory Visit (INDEPENDENT_AMBULATORY_CARE_PROVIDER_SITE_OTHER): Payer: Self-pay | Admitting: Adult Health

## 2022-09-21 VITALS — BP 108/70 | HR 80 | Temp 97.9°F | Ht 65.0 in | Wt 235.0 lb

## 2022-09-21 DIAGNOSIS — I5042 Chronic combined systolic (congestive) and diastolic (congestive) heart failure: Secondary | ICD-10-CM

## 2022-09-21 DIAGNOSIS — J9611 Chronic respiratory failure with hypoxia: Secondary | ICD-10-CM

## 2022-09-21 DIAGNOSIS — G4733 Obstructive sleep apnea (adult) (pediatric): Secondary | ICD-10-CM

## 2022-09-21 NOTE — Assessment & Plan Note (Signed)
Chronic respiratory failure on nocturnal oxygen since January 2022.  Suspect is related to underlying sleep apnea and OHS.  Patient is recommend to use her CPAP at bedtime.  Previous split-night study did not show she required oxygen during titration portion.  For now would restart CPAP.  When she is back on CPAP and controlled then would consider a overnight oximetry test on CPAP or a CPAP titration study if needed  Plan  Patient Instructions  Change CPAP pressure -Auto CPAP 5 to 15 cm .  Work on healthy weight  Do not drive if sleepy  Healthy sleep regimen  Saline nasal spray Twice daily   Saline nasal gel At bedtime   Follow up in 6 weeks and As needed

## 2022-09-21 NOTE — Assessment & Plan Note (Signed)
Healthy weight loss discussed 

## 2022-09-21 NOTE — Patient Instructions (Signed)
Change CPAP pressure -Auto CPAP 5 to 15 cm .  Work on healthy weight  Do not drive if sleepy  Healthy sleep regimen  Saline nasal spray Twice daily   Saline nasal gel At bedtime   Follow up in 6 weeks and As needed

## 2022-09-21 NOTE — Assessment & Plan Note (Signed)
Appears euvolemic on exam.  Continue follow-up with cardiology 

## 2022-09-21 NOTE — Progress Notes (Signed)
$'@Patient'Z$  ID: Maria Duncan, female    DOB: [birth year], 57 y.o.   MRN: [id number]  Chief Complaint  Patient presents with   Consult    Referring provider: Fenton Foy, NP  HPI: 57 year old female seen for sleep consult September 13, 2022 to establish for sleep apnea Medical history significant for combined congestive heart failure, nonischemic cardiomyopathy, coronary artery disease, sleep apnea, diabetes  TEST/EVENTS :  NPSG June 2021 showed severe sleep apnea with AHI at 99/hour and SpO2 low at 51%.  Optimal control on CPAP 17 cm H2O.  2D echo May 2021 EF 35 to [id number], grade 1 diastolic dysfunction and moderately elevated pulmonary artery systolic pressure  Right and left heart cath Dec 09, 2019 minimal nonobstructive coronary artery disease, severe nonischemic cardiomyopathy with a EF of 25%, felt to have probable associated chronic  OHS as patient's pCO2 was 90 with near compensation on ABG.  2D echo October 2021 showed EF at 50 to [id number], grade 1 diastolic dysfunction, RV size mildly enlarged, (improved LV and RV function)    09/21/2022 Sleep consult  Patient presents for a sleep consult today.  Kindly referred by primary care provider Lazaro Arms, NP.  Patient says she was diagnosed with sleep apnea in 2021.  In lab sleep study in June 2021 showed severe sleep apnea with AHI at 99/hour and SpO2 low at 51%.  Optimal control was maintained on CPAP 17 cm H2O.  Patient was recommended started on CPAP.  Patient says she has been trying to use CPAP on and off since then.  But feels that the pressure is just way too high.  She currently likes her fullface mask but the pressure she can only wear it for an hour or 2 and then takes her machine off.  CPAP download shows that patient is on 17 cm H2O.  With minimal use.  AHI 0.5/hour..  Patient says she typically goes to bed about 11 PM to midnight takes about 20 minutes to go to sleep but is up multiple times throughout the night.  She gets up  about 9 AM.  Weight is up about 25 pounds over the last 2 years.  Current weight is at 235 pounds with a BMI 39.  Patient says she had a severe case of COVID-19 in December 2021.  She was discharged home on oxygen and has been on oxygen ever since at bedtime.  Patient says when she does not wear her CPAP.  She uses oxygen at 5 L.  Patient says she typically takes 3-4 naps a day.  Typically falls asleep watching TV.  And sleeps about 20 minutes.  She does not take any sleep aids.  And does not take any caffeine in.  Epworth score is 15 out of 24.  Typically gets sleepy if she sits down to watch TV in the evening hours and after lunch. No symptoms suspicious for cataplexy or sleep paralysis. Patient does have neuropathy and is on gabapentin    Allergies  Allergen Reactions   Hyzaar [Losartan Potassium-Hctz] Nausea And Vomiting and Cough    Patient can take Delene Loll now    Immunization History  Administered Date(s) Administered   Tdap 09/17/2020    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) (HCC)    Chest pain of uncertain etiology, non obstructive CAD, pain due to acute HF 12/12/2019   CHF (congestive heart failure) (HCC)    Diabetes mellitus without complication ([place])  Hyperlipidemia    Hypertension    Hypoventilation associated with obesity ([place]) 12/12/2019   Morbid obesity ([place])    NICM (nonischemic cardiomyopathy) ([place])    OSA (obstructive sleep apnea) 12/12/2019   Pulmonary hypertension, unspecified ([place]) 12/12/2019   Social history patient is married.  She works in [employer] as a Training and development officer.  She has adult children.  She is a never smoker.  No alcohol or drug use.  Family history positive for allergies, asthma, heart disease and cancer  Tobacco History: Social History   Tobacco Use  Smoking Status Never  Smokeless Tobacco Never   Counseling given: Not Answered   Outpatient Medications Prior to Visit   Medication Sig Dispense Refill   Ascorbic Acid (VITAMIN C PO) Take by mouth.     aspirin 81 MG EC tablet Take 1 tablet (81 mg total) by mouth daily. 90 tablet 3   atorvastatin (LIPITOR) 80 MG tablet Take 1 tablet (80 mg total) by mouth daily. 90 tablet 3   carvedilol (COREG) 6.25 MG tablet Take 1 tablet (6.25 mg total) by mouth 2 (two) times daily. 60 tablet 11   cholecalciferol (VITAMIN D3) 25 MCG (1000 UNIT) tablet Take 1,000 Units by mouth daily.     diclofenac (VOLTAREN) 75 MG EC tablet Take 1 tablet (75 mg total) by mouth 2 (two) times daily. 50 tablet 2   docusate sodium (COLACE) 100 MG capsule Take 1 capsule (100 mg total) by mouth 2 (two) times daily. 10 capsule 0   gabapentin (NEURONTIN) 100 MG capsule Take 1 capsule (100 mg total) by mouth 3 (three) times daily. 90 capsule 3   glucose blood (TRUE METRIX BLOOD GLUCOSE TEST) test strip Use as instructed 100 each 12   insulin glargine (LANTUS) 100 UNIT/ML Solostar Pen Inject 20 Units into the skin daily. 15 mL 11   Insulin Pen Needle 31G X 5 MM MISC Use daily at 2 PM. 100 each 11   metFORMIN (GLUCOPHAGE-XR) 500 MG 24 hr tablet Take 1 tablet (500 mg total) by mouth 2 (two) times daily with a meal. 180 tablet 2   Multiple Vitamins-Minerals (ZINC PO) Take by mouth.     polyethylene glycol (MIRALAX / GLYCOLAX) 17 g packet Take 17 g by mouth daily as needed (constipation). 14 each 0   sacubitril-valsartan (ENTRESTO) 49-51 MG Take 1 tablet by mouth 2 (two) times daily. 180 tablet 3   spironolactone (ALDACTONE) 25 MG tablet Take 1 tablet (25 mg total) by mouth daily. 30 tablet 5   torsemide (DEMADEX) 20 MG tablet Take 1 tablet (20 mg total) by mouth daily. Please call to schedule follow up for any additional refills [phone] thanks (Patient taking differently: Take 20 mg by mouth daily. Please call to schedule follow up for any additional refills [phone] thanks  As needed) 30 tablet 3   TRUEplus Lancets 28G MISC Use as directed in the  morning and at bedtime. 100 each 1   fluconazole (DIFLUCAN) 150 MG tablet Take 1 tablet (150 mg total) by mouth once for 1 dose. (Patient not taking: Reported on 09/21/2022) 1 tablet 0   No facility-administered medications prior to visit.     Review of Systems:   Constitutional:   No  weight loss, night sweats,  Fevers, chills,  +fatigue, or  lassitude.  HEENT:   No headaches,  Difficulty swallowing,  Tooth/dental problems, or  Sore throat,                No sneezing,  itching, ear ache, nasal congestion, post nasal drip,   CV:  No chest pain,  Orthopnea, PND, swelling in lower extremities, anasarca, dizziness, palpitations, syncope.   GI  No heartburn, indigestion, abdominal pain, nausea, vomiting, diarrhea, change in bowel habits, loss of appetite, bloody stools.   Resp:   No chest wall deformity  Skin: no rash or lesions.  GU: no dysuria, change in color of urine, no urgency or frequency.  No flank pain, no hematuria   MS:  No joint pain or swelling.  No decreased range of motion.  No back pain.    Physical Exam  BP 108/70 (BP Location: Left Wrist, Patient Position: Sitting, Cuff Size: Large)   Pulse 80   Temp 97.9 F (36.6 C) (Oral)   Ht '5\' 5"'$  (1.651 m)   Wt 235 lb (106.6 kg)   SpO2 95%   BMI 39.11 kg/m   GEN: A/Ox3; pleasant , NAD, well nourished    HEENT:  [place]/AT,  NOSE-clear, THROAT-clear, no lesions, no postnasal drip or exudate noted.  Class III-IV MP airway  NECK:  Supple w/ fair ROM; no JVD; normal carotid impulses w/o bruits; no thyromegaly or nodules palpated; no lymphadenopathy.    RESP  Clear  P & A; w/o, wheezes/ rales/ or rhonchi. no accessory muscle use, no dullness to percussion  CARD:  RRR, no m/r/g, no peripheral edema, pulses intact, no cyanosis or clubbing.  GI:   Soft & nt; nml bowel sounds; no organomegaly or masses detected.   Musco: Warm bil, no deformities or joint swelling noted.   Neuro: alert, no focal deficits noted.    Skin:  Warm, no lesions or rashes    Lab Results:  CBC   BNP Imaging: No results found.        No data to display          No results found for: "NITRICOXIDE"      Assessment & Plan:   OSA (obstructive sleep apnea) History of very severe sleep apnea with significant nocturnal hypoxemia and probable with OHS .  She has very poor compliance with CPAP.  Will need to adjust CPAP pressures to see if this is more tolerable.  Patient has multiple comorbidities including congestive heart failure, cardiomyopathy and diabetes.  Patient needs to resume CPAP therapy.  Patient education given on untreated sleep apnea potential complications.  Patient will restart CPAP.  Will adjust CPAP pressure for comfort.  Begin CPAP 5 to 15 cm H2O.  If unable to tolerate would recommend a CPAP titration study.  - discussed how weight can impact sleep and risk for sleep disordered breathing - discussed options to assist with weight loss: combination of diet modification, cardiovascular and strength training exercises   - had an extensive discussion regarding the adverse health consequences related to untreated sleep disordered breathing - specifically discussed the risks for hypertension, coronary artery disease, cardiac dysrhythmias, cerebrovascular disease, and diabetes - lifestyle modification discussed   - discussed how sleep disruption can increase risk of accidents, particularly when driving - safe driving practices were discussed   Plan  Patient Instructions  Change CPAP pressure -Auto CPAP 5 to 15 cm .  Work on healthy weight  Do not drive if sleepy  Healthy sleep regimen  Saline nasal spray Twice daily   Saline nasal gel At bedtime   Follow up in 6 weeks and As needed       Chronic respiratory failure with hypoxia (HCC) Chronic respiratory failure on nocturnal oxygen  since January 2022.  Suspect is related to underlying sleep apnea and OHS.  Patient is recommend to use her CPAP at  bedtime.  Previous split-night study did not show she required oxygen during titration portion.  For now would restart CPAP.  When she is back on CPAP and controlled then would consider a overnight oximetry test on CPAP or a CPAP titration study if needed  Plan  Patient Instructions  Change CPAP pressure -Auto CPAP 5 to 15 cm .  Work on healthy weight  Do not drive if sleepy  Healthy sleep regimen  Saline nasal spray Twice daily   Saline nasal gel At bedtime   Follow up in 6 weeks and As needed       Combined congestive systolic and diastolic heart failure ([place]) Appears euvolemic on exam.  Continue follow-up with cardiology  Morbid obesity ([place]) Healthy weight loss discussed    Rexene Edison, NP 09/21/2022

## 2022-09-21 NOTE — Assessment & Plan Note (Signed)
History of very severe sleep apnea with significant nocturnal hypoxemia and probable with OHS .  She has very poor compliance with CPAP.  Will need to adjust CPAP pressures to see if this is more tolerable.  Patient has multiple comorbidities including congestive heart failure, cardiomyopathy and diabetes.  Patient needs to resume CPAP therapy.  Patient education given on untreated sleep apnea potential complications.  Patient will restart CPAP.  Will adjust CPAP pressure for comfort.  Begin CPAP 5 to 15 cm H2O.  If unable to tolerate would recommend a CPAP titration study.  - discussed how weight can impact sleep and risk for sleep disordered breathing - discussed options to assist with weight loss: combination of diet modification, cardiovascular and strength training exercises   - had an extensive discussion regarding the adverse health consequences related to untreated sleep disordered breathing - specifically discussed the risks for hypertension, coronary artery disease, cardiac dysrhythmias, cerebrovascular disease, and diabetes - lifestyle modification discussed   - discussed how sleep disruption can increase risk of accidents, particularly when driving - safe driving practices were discussed   Plan  Patient Instructions  Change CPAP pressure -Auto CPAP 5 to 15 cm .  Work on healthy weight  Do not drive if sleepy  Healthy sleep regimen  Saline nasal spray Twice daily   Saline nasal gel At bedtime   Follow up in 6 weeks and As needed

## 2022-09-26 ENCOUNTER — Other Ambulatory Visit: Payer: Self-pay

## 2022-09-26 ENCOUNTER — Other Ambulatory Visit: Payer: Self-pay | Admitting: Internal Medicine

## 2022-09-26 MED ORDER — SPIRONOLACTONE 25 MG PO TABS
25.0000 mg | ORAL_TABLET | Freq: Every day | ORAL | 5 refills | Status: DC
  Filled 2022-09-26: qty 30, 30d supply, fill #0
  Filled 2022-10-19 (×2): qty 30, 30d supply, fill #1
  Filled 2022-11-22 – 2022-11-23 (×2): qty 30, 30d supply, fill #2
  Filled 2022-12-22: qty 30, 30d supply, fill #3
  Filled 2023-01-20: qty 30, 30d supply, fill #4
  Filled 2023-03-01: qty 30, 30d supply, fill #5

## 2022-09-28 ENCOUNTER — Telehealth: Payer: Self-pay

## 2022-09-28 ENCOUNTER — Ambulatory Visit: Payer: Self-pay | Admitting: Podiatry

## 2022-09-28 ENCOUNTER — Other Ambulatory Visit: Payer: Self-pay

## 2022-09-28 NOTE — Progress Notes (Signed)
Attempted to contact patient for scheduled appointment for medication management. Left HIPAA compliant message for patient to return my call at their convenience.    Joseph Art, Pharm.D. PGY-2 Ambulatory Care Pharmacy Resident

## 2022-09-30 ENCOUNTER — Other Ambulatory Visit (HOSPITAL_COMMUNITY): Payer: Self-pay

## 2022-09-30 MED ORDER — SACUBITRIL-VALSARTAN 49-51 MG PO TABS: 1.0000 | ORAL_TABLET | Freq: Two times a day (BID) | ORAL | 3 refills | Status: DC

## 2022-10-05 ENCOUNTER — Ambulatory Visit: Payer: Self-pay | Admitting: Podiatry

## 2022-10-12 ENCOUNTER — Ambulatory Visit: Payer: Self-pay | Admitting: Podiatry

## 2022-10-14 ENCOUNTER — Other Ambulatory Visit: Payer: Self-pay | Admitting: Nurse Practitioner

## 2022-10-14 ENCOUNTER — Other Ambulatory Visit: Payer: Self-pay

## 2022-10-14 MED ORDER — TRUEPLUS 5-BEVEL PEN NEEDLES 31G X 5 MM MISC
1.0000 "pen " | Freq: Every day | 11 refills | Status: DC
  Filled 2022-10-14: qty 100, 90d supply, fill #0
  Filled 2023-01-20 – 2023-02-13 (×2): qty 100, 90d supply, fill #1

## 2022-10-14 MED ORDER — INSULIN GLARGINE 100 UNIT/ML SOLOSTAR PEN
20.0000 [IU] | PEN_INJECTOR | Freq: Every day | SUBCUTANEOUS | 11 refills | Status: DC
  Filled 2022-10-14: qty 6, 30d supply, fill #0
  Filled 2022-11-22 – 2022-11-23 (×2): qty 6, 30d supply, fill #1

## 2022-10-17 ENCOUNTER — Other Ambulatory Visit: Payer: Self-pay

## 2022-10-18 ENCOUNTER — Other Ambulatory Visit: Payer: Self-pay

## 2022-10-18 ENCOUNTER — Telehealth: Payer: Self-pay | Admitting: Podiatry

## 2022-10-18 NOTE — Telephone Encounter (Signed)
Pt called to r/s her appt she missed from a referral.   She is self pay and I told her it would be 200.00 up front but she stated she was told it would 65.00 because she had a referral.  She is an established pt.I am not sure who told pt that but told pt I would put note in chart.

## 2022-10-19 ENCOUNTER — Other Ambulatory Visit (HOSPITAL_COMMUNITY): Payer: Self-pay | Admitting: Family Medicine

## 2022-10-19 ENCOUNTER — Other Ambulatory Visit: Payer: Self-pay

## 2022-10-19 MED ORDER — ATORVASTATIN CALCIUM 80 MG PO TABS
80.0000 mg | ORAL_TABLET | Freq: Every day | ORAL | 3 refills | Status: DC
  Filled 2022-10-19: qty 90, 90d supply, fill #0
  Filled 2023-01-20 – 2023-02-13 (×2): qty 90, 90d supply, fill #1
  Filled 2023-04-21 – 2023-08-14 (×3): qty 90, 90d supply, fill #2

## 2022-10-20 ENCOUNTER — Other Ambulatory Visit: Payer: Self-pay

## 2022-10-26 ENCOUNTER — Ambulatory Visit: Payer: Self-pay | Admitting: Podiatry

## 2022-11-02 ENCOUNTER — Ambulatory Visit: Payer: Self-pay | Admitting: Adult Health

## 2022-11-09 ENCOUNTER — Ambulatory Visit: Payer: Self-pay | Admitting: Podiatry

## 2022-11-14 ENCOUNTER — Other Ambulatory Visit: Payer: Self-pay

## 2022-11-14 ENCOUNTER — Other Ambulatory Visit: Payer: Self-pay | Admitting: Pharmacist

## 2022-11-14 NOTE — Progress Notes (Signed)
Patient seen by Karlton Lemon, PharmD Candidate on 11/14/2022 while they were picking up prescriptions at [facility] at [facility].   Blood pressure today was : 147/83, HR 60; on recheck (if >140 or >90): 136/80, HR 60. Patient has not taken their meds in 2 days.   Patient has an automated home blood pressure machine. It is currently broken. Patient states they are going to [facility] today to purchase a new BP monitor. Patient took multiple BP Logs to record their at home readings .    Medication review was performed. They are taking medications as prescribed. However, patient has had recent ankle pain that has prevented them from picking up their medications last week. They have missed 2 days of their BP meds, but are picking them up at the pharmacy today.   The following barriers to adherence were noted:  - They do not have cost concerns.  - They do not have transportation concerns.  - They do not need assistance obtaining refills.  - They do occasionally forget to take some of their prescribed medications.  - They do not feel like one/some of their medications make them feel poorly.  - They do not have questions or concerns about their medications.  - They do have follow up scheduled with their primary care provider/cardiologist.   The following interventions were completed:  - Medications were reviewed  - Patient was educated on goal blood pressures and long term health implications of elevated blood pressure  - Patient was educated on proper technique to check home blood pressure and reminded to bring home machine and readings to next provider appointment. Patient was given BP logs for their home monitor readings.  - Patient was educated on how to access home blood pressure machine. Purchasing new machine since old one is broken.  - Patient was counseled on lifestyle modifications to improve blood pressure, including diet and exercise. Patient has foot pain  that has prevented exercise recently but is currently following the DASH diet.   The patient has follow up scheduled:  12/14/2022 with Dr. Rico Sheehan, PharmD Candidate   Catie Eppie Gibson, PharmD, BCACP, CPP [facility] [phone]

## 2022-11-16 ENCOUNTER — Ambulatory Visit: Payer: Self-pay | Admitting: Podiatry

## 2022-11-21 ENCOUNTER — Other Ambulatory Visit: Payer: Self-pay

## 2022-11-22 ENCOUNTER — Ambulatory Visit: Payer: Self-pay | Admitting: Adult Health

## 2022-11-23 ENCOUNTER — Other Ambulatory Visit: Payer: Self-pay

## 2022-11-23 ENCOUNTER — Ambulatory Visit: Payer: Self-pay | Admitting: Podiatry

## 2022-11-23 ENCOUNTER — Telehealth: Payer: Self-pay | Admitting: Podiatry

## 2022-11-23 NOTE — Telephone Encounter (Signed)
Pt called and left a message today @746am  to cancel her appt today due to a family emergency.

## 2022-11-24 ENCOUNTER — Other Ambulatory Visit: Payer: Self-pay

## 2022-11-29 NOTE — Telephone Encounter (Signed)
Pt was called. KH 

## 2022-12-02 ENCOUNTER — Other Ambulatory Visit (HOSPITAL_COMMUNITY): Payer: Self-pay

## 2022-12-02 ENCOUNTER — Telehealth (HOSPITAL_COMMUNITY): Payer: Self-pay | Admitting: Pharmacy Technician

## 2022-12-02 NOTE — Telephone Encounter (Signed)
Advanced Heart Failure Patient Advocate Encounter  Received [employer] renewal application for [place] assistance that is set to end on 01/14/23. Called and spoke with patient. She is currently uninsured. Explained that POI is now required and that she will need to speak with [employer] once the application has been submitted.   Will have application at check in desk for signature.

## 2022-12-05 ENCOUNTER — Other Ambulatory Visit: Payer: Self-pay | Admitting: Nurse Practitioner

## 2022-12-05 ENCOUNTER — Encounter: Payer: Self-pay | Admitting: Nurse Practitioner

## 2022-12-05 DIAGNOSIS — Z1231 Encounter for screening mammogram for malignant neoplasm of breast: Secondary | ICD-10-CM

## 2022-12-13 ENCOUNTER — Ambulatory Visit
Admission: RE | Admit: 2022-12-13 | Discharge: 2022-12-13 | Disposition: A | Discharge status: Home / Self Care | Payer: Self-pay | Source: Ambulatory Visit | Attending: Nurse Practitioner | Admitting: Nurse Practitioner

## 2022-12-13 DIAGNOSIS — Z1231 Encounter for screening mammogram for malignant neoplasm of breast: Secondary | ICD-10-CM

## 2022-12-14 ENCOUNTER — Ambulatory Visit (INDEPENDENT_AMBULATORY_CARE_PROVIDER_SITE_OTHER): Payer: Self-pay | Admitting: Nurse Practitioner

## 2022-12-14 ENCOUNTER — Other Ambulatory Visit: Payer: Self-pay | Admitting: Nurse Practitioner

## 2022-12-14 ENCOUNTER — Other Ambulatory Visit (HOSPITAL_COMMUNITY): Payer: Self-pay | Admitting: Internal Medicine

## 2022-12-14 ENCOUNTER — Other Ambulatory Visit: Payer: Self-pay

## 2022-12-14 ENCOUNTER — Encounter: Payer: Self-pay | Admitting: Nurse Practitioner

## 2022-12-14 VITALS — BP 114/60 | HR 68 | Temp 97.2°F | Wt 232.6 lb

## 2022-12-14 DIAGNOSIS — E875 Hyperkalemia: Secondary | ICD-10-CM

## 2022-12-14 DIAGNOSIS — E1165 Type 2 diabetes mellitus with hyperglycemia: Secondary | ICD-10-CM

## 2022-12-14 DIAGNOSIS — Z1211 Encounter for screening for malignant neoplasm of colon: Secondary | ICD-10-CM

## 2022-12-14 LAB — POCT GLYCOSYLATED HEMOGLOBIN (HGB A1C): Hemoglobin A1C: 7.8 % — AB (ref 4.0–5.6)

## 2022-12-14 MED ORDER — CARVEDILOL 6.25 MG PO TABS
6.2500 mg | ORAL_TABLET | Freq: Two times a day (BID) | ORAL | 0 refills | Status: DC
  Filled 2022-12-14: qty 60, 30d supply, fill #0

## 2022-12-14 MED ORDER — INSULIN GLARGINE 100 UNIT/ML SOLOSTAR PEN
22.0000 [IU] | PEN_INJECTOR | Freq: Every day | SUBCUTANEOUS | 2 refills | Status: DC
  Filled 2022-12-14: qty 6, 27d supply, fill #0
  Filled 2023-01-20: qty 6, 27d supply, fill #1
  Filled 2023-03-01: qty 6, 27d supply, fill #2
  Filled 2023-04-21: qty 6, 27d supply, fill #3

## 2022-12-14 NOTE — Patient Instructions (Addendum)
1. Colon cancer screening  - Ambulatory referral to Gastroenterology   2. Type 2 diabetes mellitus with hyperglycemia, unspecified whether long term insulin use (HCC)  - POCT glycosylated hemoglobin (Hb A1C)   3. Hyperkalemia  - Basic Metabolic Panel    Follow up:  Follow up in 3 months

## 2022-12-14 NOTE — Progress Notes (Signed)
@Patient  ID: Maria Duncan, female    DOB: 1965/11/28, 57 y.o.   MRN: [id number]  Chief Complaint  Patient presents with   Diabetes    Follow up    Referring provider: Ivonne Andrew, NP   HPI  Maria Duncan presents for follow up. 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    Diabetes:   Patient presents today for a follow  on diabetes. She is currently on lantus and metformin. Patient's husband is urging her to stop taking all of her medications and start her on a naturopathic course. We discussed that her A1c is 7.8 today and that we would strongly recommend she stay on her medications as directed.  Patient is walking daily and trying to eat healthier.  She is taking ginger and water daily denies f/c/s, n/v/d, hemoptysis, PND, leg swelling Denies chest pain or edema       Allergies  Allergen Reactions   Hyzaar [Losartan Potassium-Hctz] Nausea And Vomiting and Cough    Patient can take Entresto now    Immunization History  Administered Date(s) Administered   Tdap 09/17/2020    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) (HCC)    Chest pain of uncertain etiology, non obstructive CAD, pain due to acute HF 12/12/2019   CHF (congestive heart failure) (HCC)    Diabetes mellitus without complication (HCC)    Hyperlipidemia    Hypertension    Hypoventilation associated with obesity (HCC) 12/12/2019   Morbid obesity (HCC)    NICM  (nonischemic cardiomyopathy) (HCC)    OSA (obstructive sleep apnea) 12/12/2019   Pulmonary hypertension, unspecified (HCC) 12/12/2019    Tobacco History: Social History   Tobacco Use  Smoking Status Never  Smokeless Tobacco Never   Counseling given: Not Answered   Outpatient Encounter Medications as of 12/14/2022  Medication Sig   Ascorbic Acid (VITAMIN C PO) Take by mouth.   aspirin 81 MG EC tablet Take 1 tablet (81 mg total) by mouth daily.   atorvastatin (LIPITOR) 80 MG tablet Take 1 tablet (80 mg total) by mouth daily.   carvedilol (COREG) 6.25 MG tablet Take 1 tablet (6.25 mg total) by mouth 2 (two) times daily.   cholecalciferol (VITAMIN D3) 25 MCG (1000 UNIT) tablet Take 1,000 Units by mouth daily.   diclofenac (VOLTAREN) 75 MG EC tablet Take 1 tablet (75 mg total) by mouth 2 (two) times daily.   docusate sodium (COLACE) 100 MG capsule Take 1 capsule (100 mg total) by mouth 2 (two) times daily.   gabapentin (NEURONTIN) 100 MG capsule Take 1 capsule (100 mg total) by mouth 3 (three) times daily.   glucose blood (TRUE METRIX BLOOD GLUCOSE TEST) test strip Use as instructed   insulin glargine (LANTUS) 100 UNIT/ML Solostar Pen Inject 20 Units into the skin daily.   Insulin Pen Needle (TRUEPLUS 5-BEVEL PEN NEEDLES) 31G X 5 MM MISC Use daily at 2 PM.   metFORMIN (GLUCOPHAGE-XR) 500 MG 24 hr tablet Take 1 tablet (500 mg total) by mouth 2 (two) times daily with a meal.   Multiple Vitamins-Minerals (ZINC  PO) Take by mouth.   polyethylene glycol (MIRALAX / GLYCOLAX) 17 g packet Take 17 g by mouth daily as needed (constipation).   sacubitril-valsartan (ENTRESTO) 49-51 MG Take 1 tablet by mouth 2 (two) times daily.   spironolactone (ALDACTONE) 25 MG tablet Take 1 tablet (25 mg total) by mouth daily.   torsemide (DEMADEX) 20 MG tablet Take 1 tablet (20 mg total) by mouth daily. Please call to schedule follow up for any additional refills [phone] thanks (Patient taking differently: Take  20 mg by mouth daily. Please call to schedule follow up for any additional refills [phone] thanks  As needed)   TRUEplus Lancets 28G MISC Use as directed in the morning and at bedtime.   No facility-administered encounter medications on file as of 12/14/2022.     Review of Systems  Review of Systems  Constitutional: Negative.   HENT: Negative.    Cardiovascular: Negative.   Gastrointestinal: Negative.   Allergic/Immunologic: Negative.   Neurological: Negative.   Psychiatric/Behavioral: Negative.         Physical Exam  BP 114/60   Pulse 68   Temp (!) 97.2 F (36.2 C)   Wt 232 lb 9.6 oz (105.5 kg)   SpO2 99%   BMI 38.71 kg/m   Wt Readings from Last 5 Encounters:  12/14/22 232 lb 9.6 oz (105.5 kg)  09/21/22 235 lb (106.6 kg)  09/07/22 232 lb 3.2 oz (105.3 kg)  03/24/22 233 lb (105.7 kg)  10/15/21 212 lb (96.2 kg)     Physical Exam Vitals and nursing note reviewed.  Constitutional:      General: She is not in acute distress.    Appearance: She is well-developed.  Cardiovascular:     Rate and Rhythm: Normal rate and regular rhythm.  Pulmonary:     Effort: Pulmonary effort is normal.     Breath sounds: Normal breath sounds.  Neurological:     Mental Status: She is alert and oriented to person, place, and time.      Lab Results:  CBC    Component Value Date/Time   WBC 6.6 09/07/2022 0918   WBC 6.1 09/01/2021 0926   RBC 4.16 09/07/2022 0918   RBC 4.45 09/01/2021 0926   HGB 12.0 09/07/2022 0918   HCT 36.7 09/07/2022 0918   PLT 218 09/07/2022 0918   MCV 88 09/07/2022 0918   MCH 28.8 09/07/2022 0918   MCH 29.2 09/01/2021 0926   MCHC 32.7 09/07/2022 0918   MCHC 34.0 09/01/2021 0926   RDW 11.7 09/07/2022 0918   LYMPHSABS 3.1 03/24/2022 1116   MONOABS 0.6 03/20/2021 0454   EOSABS 0.2 03/24/2022 1116   BASOSABS 0.0 03/24/2022 1116    BMET    Component Value Date/Time   NA 138 09/07/2022 0918   K 5.3 (H) 09/07/2022 0918   CL 96 09/07/2022 0918    CO2 28 09/07/2022 0918   GLUCOSE 380 (H) 09/07/2022 0918   GLUCOSE 661 (HH) 09/01/2021 0926   BUN 22 09/07/2022 0918   CREATININE 1.07 (H) 09/07/2022 0918   CREATININE 0.85 03/02/2014 1603   CALCIUM 9.9 09/07/2022 0918   GFRNONAA 57 (L) 09/01/2021 0926   GFRAA 66 09/17/2020 1031    BNP    Component Value Date/Time   BNP 420.3 (H) 07/27/2020 0150    ProBNP    Component Value Date/Time   PROBNP 957 (H) 12/19/2019 1626   PROBNP 150.6 (H) 03/17/2013 1331    Imaging: MM 3D SCREENING MAMMOGRAM BILATERAL BREAST  Result Date:  12/13/2022 CLINICAL DATA:  Screening. EXAM: DIGITAL SCREENING BILATERAL MAMMOGRAM WITH TOMOSYNTHESIS AND CAD TECHNIQUE: Bilateral screening digital craniocaudal and mediolateral oblique mammograms were obtained. Bilateral screening digital breast tomosynthesis was performed. The images were evaluated with computer-aided detection. COMPARISON:  Previous exam(s). ACR Breast Density Category b: There are scattered areas of fibroglandular density. FINDINGS: There are no findings suspicious for malignancy. IMPRESSION: No mammographic evidence of malignancy. A result letter of this screening mammogram will be mailed directly to the patient. RECOMMENDATION: Screening mammogram in one year. (Code:SM-B-01Y) BI-RADS CATEGORY  1: Negative. Electronically Signed   By: Frederico Hamman M.D.   On: 12/13/2022 16:29     Assessment & Plan:   Colon cancer screening - Ambulatory referral to Gastroenterology   2. Type 2 diabetes mellitus with hyperglycemia, unspecified whether long term insulin use (HCC)  - POCT glycosylated hemoglobin (Hb A1C)   3. Hyperkalemia  - Basic Metabolic Panel    Follow up:  Follow up in 3 months     Ivonne Andrew, NP 12/14/2022

## 2022-12-14 NOTE — Assessment & Plan Note (Signed)
-   Ambulatory referral to Gastroenterology   2. Type 2 diabetes mellitus with hyperglycemia, unspecified whether long term insulin use (HCC)  - POCT glycosylated hemoglobin (Hb A1C)   3. Hyperkalemia  - Basic Metabolic Panel    Follow up:  Follow up in 3 months

## 2022-12-15 LAB — BASIC METABOLIC PANEL
BUN/Creatinine Ratio: 20 (ref 9–23)
BUN: 20 mg/dL (ref 6–24)
CO2: 27 mmol/L (ref 20–29)
Calcium: 9.1 mg/dL (ref 8.7–10.2)
Chloride: 105 mmol/L (ref 96–106)
Creatinine, Ser: 0.98 mg/dL (ref 0.57–1.00)
Glucose: 171 mg/dL — ABNORMAL HIGH (ref 70–99)
Potassium: 4.6 mmol/L (ref 3.5–5.2)
Sodium: 144 mmol/L (ref 134–144)
eGFR: 68 mL/min/{1.73_m2} (ref >59)

## 2022-12-22 ENCOUNTER — Other Ambulatory Visit: Payer: Self-pay

## 2022-12-23 ENCOUNTER — Telehealth (HOSPITAL_COMMUNITY): Payer: Self-pay | Admitting: Licensed Clinical Social Worker

## 2022-12-23 NOTE — Telephone Encounter (Signed)
Advanced Heart Failure Patient Advocate Encounter  Patient stated she is going to come pick up application for signature next week.  Will follow up.

## 2022-12-23 NOTE — Telephone Encounter (Signed)
H&V Care Navigation CSW Progress Note  Clinical Social Worker called pt to check in regarding her progress with entresto app.  Pt reports she has completed her portion and will plan to bring to clinic.  CSW reminded her that she needs proof of income but she reports she has no income at this time and her spouse has sporatic under the table income so they have no proof to provide.  Explained they will need to do a non income attestation after everything is submitted.  CSW also discussed pt likely eligibility for Medicaid based on her and her spouses current income.  Provided her with information about clinic at [facility] to apply for Medicaid.  Pt will plan to drop off entresto app then go to [facility] to apply for Medicaid on Monday.   SDOH Screenings   Depression (PHQ2-9): Low Risk  (09/07/2022)  Tobacco Use: Low Risk  (12/14/2022)     Burna Sis, LCSW Clinical Social Worker [facility] Desk#: [phone] Cell#: [phone]

## 2022-12-26 ENCOUNTER — Other Ambulatory Visit: Payer: Self-pay

## 2022-12-26 ENCOUNTER — Other Ambulatory Visit: Payer: Self-pay | Admitting: Nurse Practitioner

## 2022-12-27 ENCOUNTER — Telehealth (HOSPITAL_COMMUNITY): Payer: Self-pay

## 2022-12-27 NOTE — Telephone Encounter (Signed)
Advanced Heart Failure Patient Advocate Encounter  Application for Entresto faxed to [employer] on 12/26/22. Application form attached to patient chart.  Burnell Blanks, CPhT Rx Patient Advocate Phone: [phone]

## 2022-12-28 ENCOUNTER — Other Ambulatory Visit: Payer: Self-pay

## 2022-12-29 NOTE — Telephone Encounter (Signed)
Advanced Heart Failure Patient Advocate [employer] is requesting proof of income before making a determination. Left voicemail for patient. Will follow up.

## 2023-01-02 ENCOUNTER — Other Ambulatory Visit: Payer: Self-pay

## 2023-01-02 NOTE — Telephone Encounter (Signed)
Advanced Heart Failure Patient Advocate Encounter  Spoke to patient by phone, pt is aware that income records are needed for [employer]. She will drop off income records at [facility] this week. Will fax when forms are available.

## 2023-01-11 ENCOUNTER — Ambulatory Visit: Payer: Self-pay | Admitting: Nurse Practitioner

## 2023-01-16 ENCOUNTER — Other Ambulatory Visit: Payer: Self-pay

## 2023-01-16 ENCOUNTER — Emergency Department (HOSPITAL_BASED_OUTPATIENT_CLINIC_OR_DEPARTMENT_OTHER)
Admission: EM | Admit: 2023-01-16 | Discharge: 2023-01-16 | Disposition: A | Discharge status: Home / Self Care | Payer: Self-pay | Attending: Emergency Medicine | Admitting: Emergency Medicine

## 2023-01-16 ENCOUNTER — Encounter (HOSPITAL_BASED_OUTPATIENT_CLINIC_OR_DEPARTMENT_OTHER): Payer: Self-pay | Admitting: *Deleted

## 2023-01-16 DIAGNOSIS — I509 Heart failure, unspecified: Secondary | ICD-10-CM | POA: Insufficient documentation

## 2023-01-16 DIAGNOSIS — Z794 Long term (current) use of insulin: Secondary | ICD-10-CM | POA: Insufficient documentation

## 2023-01-16 DIAGNOSIS — Z7984 Long term (current) use of oral hypoglycemic drugs: Secondary | ICD-10-CM | POA: Insufficient documentation

## 2023-01-16 DIAGNOSIS — I110 Hypertensive heart disease with heart failure: Secondary | ICD-10-CM | POA: Insufficient documentation

## 2023-01-16 DIAGNOSIS — E119 Type 2 diabetes mellitus without complications: Secondary | ICD-10-CM | POA: Insufficient documentation

## 2023-01-16 DIAGNOSIS — H1031 Unspecified acute conjunctivitis, right eye: Secondary | ICD-10-CM | POA: Insufficient documentation

## 2023-01-16 DIAGNOSIS — J069 Acute upper respiratory infection, unspecified: Secondary | ICD-10-CM | POA: Insufficient documentation

## 2023-01-16 DIAGNOSIS — Z7982 Long term (current) use of aspirin: Secondary | ICD-10-CM | POA: Insufficient documentation

## 2023-01-16 MED ORDER — ERYTHROMYCIN 5 MG/GM OP OINT
TOPICAL_OINTMENT | Freq: Once | OPHTHALMIC | Status: AC
  Filled 2023-01-16: qty 3.5

## 2023-01-16 NOTE — Telephone Encounter (Signed)
Advanced Heart Failure Patient Advocate Encounter  Spoke with patient, she stated that she has been in communication with [employer] and has sent in her attestation letter. Patient is also in the process of applying for medicaid. [employer] representative verified that no letter has been received at this time. Will continue to follow up.

## 2023-01-16 NOTE — ED Triage Notes (Signed)
Pt here with "pink eye" that started yesterday. Right eyelid swollen and sclera was red. Vision is slightly blurred.  Pt states she has been congested and has had bilateral ear pain. Also c/o of cough.

## 2023-01-16 NOTE — ED Provider Notes (Signed)
[place] EMERGENCY DEPARTMENT AT [facility]  Provider Note  CSN: [id number] Arrival date & time: 01/16/23 0431  History Chief Complaint  Patient presents with   Eye Drainage    Maria Duncan is a 57 y.o. female with history of HTN, DM, CHF has had 24 hours of nasal congestion, cough and R eye redness/drainage. No fevers. No sick contacts.    Home Medications Prior to Admission medications   Medication Sig Start Date End Date Taking? Authorizing Provider  Ascorbic Acid (VITAMIN C PO) Take by mouth.    [provider]  aspirin 81 MG EC tablet Take 1 tablet (81 mg total) by mouth daily. 12/20/19   Duke Salvia, MD  atorvastatin (LIPITOR) 80 MG tablet Take 1 tablet (80 mg total) by mouth daily. 10/19/22   [place], Anderson Malta, FNP  carvedilol (COREG) 6.25 MG tablet Take 1 tablet (6.25 mg total) by mouth 2 (two) times daily. 12/14/22   Bensimhon, Bevelyn Buckles, MD  cholecalciferol (VITAMIN D3) 25 MCG (1000 UNIT) tablet Take 1,000 Units by mouth daily.    [provider]  diclofenac (VOLTAREN) 75 MG EC tablet Take 1 tablet (75 mg total) by mouth 2 (two) times daily. 05/23/22   Lenn Sink, DPM  docusate sodium (COLACE) 100 MG capsule Take 1 capsule (100 mg total) by mouth 2 (two) times daily. 12/12/19   Leone Brand, NP  gabapentin (NEURONTIN) 100 MG capsule Take 1 capsule (100 mg total) by mouth 3 (three) times daily. 03/24/22   Ivonne Andrew, NP  glucose blood (TRUE METRIX BLOOD GLUCOSE TEST) test strip Use as instructed 10/15/21   Barbette Merino, NP  insulin glargine (LANTUS) 100 UNIT/ML Solostar Pen Inject 22 Units into the skin daily. 12/14/22   Ivonne Andrew, NP  Insulin Pen Needle (TRUEPLUS 5-BEVEL PEN NEEDLES) 31G X 5 MM MISC Use daily at 2 PM. 10/14/22   Ivonne Andrew, NP  metFORMIN (GLUCOPHAGE-XR) 500 MG 24 hr tablet Take 1 tablet (500 mg total) by mouth 2 (two) times daily with a meal. 04/06/22   Ivonne Andrew, NP  Multiple Vitamins-Minerals  (ZINC PO) Take by mouth.    [provider]  polyethylene glycol (MIRALAX / GLYCOLAX) 17 g packet Take 17 g by mouth daily as needed (constipation). 12/12/19   Leone Brand, NP  sacubitril-valsartan (ENTRESTO) 49-51 MG Take 1 tablet by mouth 2 (two) times daily. 09/30/22   Bensimhon, Bevelyn Buckles, MD  spironolactone (ALDACTONE) 25 MG tablet Take 1 tablet (25 mg total) by mouth daily. 09/26/22   Bensimhon, Bevelyn Buckles, MD  torsemide (DEMADEX) 20 MG tablet Take 1 tablet (20 mg total) by mouth daily. Please call to schedule follow up for any additional refills [phone] thanks Patient taking differently: Take 20 mg by mouth daily. Please call to schedule follow up for any additional refills [phone] thanks  As needed 06/22/22   Laurey Morale, MD  TRUEplus Lancets 28G MISC Use as directed in the morning and at bedtime. 10/15/21   Barbette Merino, NP     Allergies    Hyzaar [losartan potassium-hctz]   Review of Systems   Review of Systems Please see HPI for pertinent positives and negatives  Physical Exam BP (!) 146/84 (BP Location: Right Arm)   Pulse 81   Temp 98.9 F (37.2 C)   Resp 18   Ht 5\' 5"  (1.651 m)   Wt 106.5 kg   SpO2 97%   BMI  39.09 kg/m   Physical Exam Vitals and nursing note reviewed.  Constitutional:      Appearance: Normal appearance.  HENT:     Head: Normocephalic and atraumatic.     Right Ear: Tympanic membrane and ear canal normal.     Left Ear: Tympanic membrane and ear canal normal.     Nose: Nose normal.     Mouth/Throat:     Mouth: Mucous membranes are moist.  Eyes:     General:        Right eye: Discharge present.     Extraocular Movements: Extraocular movements intact.     Pupils: Pupils are equal, round, and reactive to light.     Comments: Marked conjunctival injection, purulent drainage. Anterior chamber is clear. Vision is grossly intact  Cardiovascular:     Rate and Rhythm: Normal rate.  Pulmonary:     Effort: Pulmonary effort is  normal.     Breath sounds: Rhonchi present. No wheezing or rales.  Abdominal:     General: Abdomen is flat.     Palpations: Abdomen is soft.     Tenderness: There is no abdominal tenderness.  Musculoskeletal:        General: No swelling. Normal range of motion.     Cervical back: Neck supple.  Skin:    General: Skin is warm and dry.  Neurological:     General: No focal deficit present.     Mental Status: She is alert.  Psychiatric:        Mood and Affect: Mood normal.     ED Results / Procedures / Treatments   EKG None  Procedures Procedures  Medications Ordered in the ED Medications  erythromycin ophthalmic ointment (has no administration in time range)    Initial Impression and Plan  Patient here with viral URI symptoms also having purulent drainage from her R eye and conjunctivitis. Will give Erythromycin ointment, recommend OTC cold remedies safe in HTN. PCP follow up, RTED for any other concerns.    ED Course       MDM Rules/Calculators/A&P Medical Decision Making Problems Addressed: Acute bacterial conjunctivitis of right eye: acute illness or injury Viral URI with cough: acute illness or injury  Risk Prescription drug management.     Final Clinical Impression(s) / ED Diagnoses Final diagnoses:  Viral URI with cough  Acute bacterial conjunctivitis of right eye    Rx / DC Orders ED Discharge Orders     None        Pollyann Savoy, MD 01/16/23 [phone]

## 2023-01-16 NOTE — Discharge Instructions (Signed)
Apply half inch of the Erythromycin ointment to the lower lid four times a day for the next week. You can also take over the counter medications such as Robitussin, or Coricidin for your cough and congestion.

## 2023-01-17 ENCOUNTER — Telehealth: Payer: Self-pay

## 2023-01-17 NOTE — Transitions of Care (Post Inpatient/ED Visit) (Signed)
   01/17/2023  Name: ANYRA KAUFMAN MRN: [id number] DOB: 1966-07-16  Today's TOC FU Call Status: Today's TOC FU Call Status:: Unsuccessul Call (1st Attempt) Unsuccessful Call (1st Attempt) Date: 01/17/23  Attempted to reach the patient regarding the most recent Inpatient/ED visit.  Follow Up Plan: Additional outreach attempts will be made to reach the patient to complete the Transitions of Care (Post Inpatient/ED visit) call.   Signature Renelda Loma RMA

## 2023-01-19 ENCOUNTER — Telehealth: Payer: Self-pay

## 2023-01-19 NOTE — Transitions of Care (Post Inpatient/ED Visit) (Signed)
01/19/2023  Name: Maria Duncan MRN: [id number] DOB: 1965/09/27  Today's TOC FU Call Status: Today's TOC FU Call Status:: Successful TOC FU Call Competed TOC FU Call Complete Date: 01/19/23  Transition Care Management Follow-up Telephone Call Date of Discharge: 01/16/23 Discharge Facility: [facility] ([facility]) How have you been since you were released from the hospital?: Same Any questions or concerns?: No  Items Reviewed: Did you receive and understand the discharge instructions provided?: Yes Medications obtained,verified, and reconciled?: Yes (Medications Reviewed) Any new allergies since your discharge?: No Dietary orders reviewed?: No Do you have support at home?: Yes People in Home: spouse  Medications Reviewed Today: Medications Reviewed Today     Reviewed by Elyse Hsu, RN (Registered Nurse) on 01/16/23 at [phone]  Med List Status: <None>   Medication Order Taking? Sig Documenting Provider Last Dose Status Informant  Ascorbic Acid (VITAMIN C PO) [id number]  Take by mouth. [provider]  Active   aspirin 81 MG EC tablet [id number]  Take 1 tablet (81 mg total) by mouth daily. Duke Salvia, MD  Active Self  atorvastatin (LIPITOR) 80 MG tablet [id number]  Take 1 tablet (80 mg total) by mouth daily. [place], Anderson Malta, [place]  Active   carvedilol (COREG) 6.25 MG tablet [id number]  Take 1 tablet (6.25 mg total) by mouth 2 (two) times daily. Bensimhon, Bevelyn Buckles, MD  Active   cholecalciferol (VITAMIN D3) 25 MCG (1000 UNIT) tablet [id number]  Take 1,000 Units by mouth daily. [provider]  Active Self           Med Note Lyda Jester, Erin Sons   Fri Oct 15, 2021  1:42 PM) Vitamin D&K  diclofenac (VOLTAREN) 75 MG EC tablet [id number]  Take 1 tablet (75 mg total) by mouth 2 (two) times daily. Lenn Sink, DPM  Active   docusate sodium (COLACE) 100 MG capsule [id number]  Take 1 capsule (100 mg total) by mouth 2 (two) times daily. Leone Brand, NP  Active  Self  gabapentin (NEURONTIN) 100 MG capsule [id number]  Take 1 capsule (100 mg total) by mouth 3 (three) times daily. Ivonne Andrew, NP  Active   glucose blood (TRUE METRIX BLOOD GLUCOSE TEST) test strip [id number]  Use as instructed Barbette Merino, NP  Active   insulin glargine (LANTUS) 100 UNIT/ML Solostar Pen [id number]  Inject 22 Units into the skin daily. Ivonne Andrew, NP  Active   Insulin Pen Needle (TRUEPLUS 5-BEVEL PEN NEEDLES) 31G X 5 MM MISC [id number]  Use daily at 2 PM. Ivonne Andrew, NP  Active   metFORMIN (GLUCOPHAGE-XR) 500 MG 24 hr tablet [id number]  Take 1 tablet (500 mg total) by mouth 2 (two) times daily with a meal. Ivonne Andrew, NP  Active   Multiple Vitamins-Minerals (ZINC PO) [id number]  Take by mouth. [provider]  Active   polyethylene glycol (MIRALAX / GLYCOLAX) 17 g packet [id number]  Take 17 g by mouth daily as needed (constipation). Leone Brand, NP  Active            Med Note Renelda Loma   Wed Sep 07, 2022  8:33 AM) Prn   sacubitril-valsartan (ENTRESTO) 49-51 MG [id number]  Take 1 tablet by mouth 2 (two) times daily. Bensimhon, Bevelyn Buckles, MD  Active   spironolactone (ALDACTONE) 25 MG tablet [id number]  Take 1 tablet (25 mg total) by mouth daily. Bensimhon, Bevelyn Buckles, MD  Active   torsemide (DEMADEX) 20 MG tablet [id number]  Take 1 tablet (20 mg total) by mouth daily. Please call to schedule follow up for any additional refills [phone] thanks  Patient taking differently: Take 20 mg by mouth daily. Please call to schedule follow up for any additional refills [phone] thanks  As needed   Laurey Morale, MD  Active            Med Note Clearance Coots, Desma Paganini Sep 14, 2022  9:42 AM) PRN  TRUEplus Lancets 28G MISC [id number]  Use as directed in the morning and at bedtime. Barbette Merino, NP  Active             Home Care and Equipment/Supplies: Were Home Health Services Ordered?: No Any new equipment or medical supplies ordered?:  No  Functional Questionnaire: Do you need assistance with bathing/showering or dressing?: No Do you need assistance with meal preparation?: No Do you need assistance with eating?: No Do you have difficulty maintaining continence: No Do you need assistance with getting out of bed/getting out of a chair/moving?: No Do you have difficulty managing or taking your medications?: No  Follow up appointments reviewed: PCP Follow-up appointment confirmed?: Yes Date of PCP follow-up appointment?: 01/25/23 Follow-up Provider: [facility] Follow-up appointment confirmed?: NA Do you need transportation to your follow-up appointment?: No Do you understand care options if your condition(s) worsen?: Yes-patient verbalized understanding    SIGNATURE Gh.

## 2023-01-20 ENCOUNTER — Other Ambulatory Visit: Payer: Self-pay

## 2023-01-25 ENCOUNTER — Inpatient Hospital Stay: Payer: Self-pay | Admitting: Nurse Practitioner

## 2023-01-25 ENCOUNTER — Telehealth: Payer: Self-pay

## 2023-01-25 NOTE — Telephone Encounter (Signed)
Returned pt to call to reschedule her appointment. Maria Duncan

## 2023-01-27 ENCOUNTER — Other Ambulatory Visit: Payer: Self-pay

## 2023-01-30 ENCOUNTER — Telehealth (HOSPITAL_COMMUNITY): Payer: Self-pay

## 2023-01-30 ENCOUNTER — Other Ambulatory Visit (HOSPITAL_COMMUNITY): Payer: Self-pay

## 2023-01-30 NOTE — Telephone Encounter (Signed)
Advanced Heart Failure Patient Advocate Encounter  Spoke with patient by phone. Patient states attestation letter was returned to [employer], [employer] states that no attestation letter has been sent to patient as of today.  Patient will contact [employer] to request a copy of the attestation letter and will bring signed copy to [facility] to be faxed in. Samples are being provided to patient in the meantime.

## 2023-01-30 NOTE — Telephone Encounter (Signed)
Advanced Heart Failure Patient Advocate Encounter  Medication Samples have been left at registration desk for patient pick up. Drug name: Sherryll Burger 49/51 MG Qty: 2x 28 ct packages LOT: [id number] Exp.: 12/2023 SIG: Take 1 tablet by mouth twice daily   The patient has been instructed regarding the correct time, dose, and frequency of taking this medication, including desired effects and most common side effects.   Burnell Blanks, CPhT Rx Patient Advocate Phone: [phone]

## 2023-02-03 ENCOUNTER — Ambulatory Visit (AMBULATORY_SURGERY_CENTER): Payer: Self-pay

## 2023-02-03 ENCOUNTER — Other Ambulatory Visit: Payer: Self-pay

## 2023-02-03 VITALS — Ht 65.0 in | Wt 236.0 lb

## 2023-02-03 DIAGNOSIS — Z1211 Encounter for screening for malignant neoplasm of colon: Secondary | ICD-10-CM

## 2023-02-03 MED ORDER — NA SULFATE-K SULFATE-MG SULF 17.5-3.13-1.6 GM/177ML PO SOLN
1.0000 | Freq: Once | ORAL | 0 refills | Status: AC
  Filled 2023-02-03: qty 354, 1d supply, fill #0

## 2023-02-03 NOTE — Progress Notes (Signed)
Patient verified name, DOB, and address;  No egg or soy allergy known to patient;  No issues known to pt with past sedation with any surgeries or procedures; Patient denies ever being told they had issues or difficulty with intubation;  No FH of Malignant Hyperthermia; Pt is not on diet pills; Pt is not on home 02;  Pt is not on blood thinners;  Pt denies issues with constipation - at this time-states she is "only taking the Miralax maybe once a month- if that" No A fib or A flutter; Have any cardiac testing pending--NO Pt instructed to use Singlecare.[email or web address] or GoodRx for a price reduction on prep;   Insurance verified during PV appt=self pay  Previsit completed and red dot placed by patient's name on their procedure day (on provider's schedule).    Instructions printed and given to patient during PV appt;

## 2023-02-09 ENCOUNTER — Other Ambulatory Visit: Payer: Self-pay

## 2023-02-13 ENCOUNTER — Other Ambulatory Visit: Payer: Self-pay

## 2023-02-13 ENCOUNTER — Other Ambulatory Visit (HOSPITAL_COMMUNITY): Payer: Self-pay | Admitting: Internal Medicine

## 2023-02-14 ENCOUNTER — Other Ambulatory Visit: Payer: Self-pay

## 2023-02-14 MED ORDER — CARVEDILOL 6.25 MG PO TABS
6.2500 mg | ORAL_TABLET | Freq: Two times a day (BID) | ORAL | 0 refills | Status: DC
  Filled 2023-02-14: qty 60, 30d supply, fill #0

## 2023-02-14 NOTE — Telephone Encounter (Signed)
Caller & Relationship to patient:  MRN #  [id number]   Call Back Number:   Date of Last Office Visit: 01/25/2023     Date of Next Office Visit: 03/22/2023    Medication(s) to be Refilled: Furosimide  Preferred Pharmacy:   ** Please notify patient to allow 48-72 hours to process** **Let patient know to contact pharmacy at the end of the day to make sure medication is ready. ** **If patient has not been seen in a year or longer, book an appointment **Advise to use MyChart for refill requests OR to contact their pharmacy

## 2023-02-15 ENCOUNTER — Other Ambulatory Visit: Payer: Self-pay

## 2023-02-15 ENCOUNTER — Encounter: Payer: Self-pay | Admitting: Gastroenterology

## 2023-02-15 ENCOUNTER — Other Ambulatory Visit: Payer: Self-pay | Admitting: Nurse Practitioner

## 2023-02-15 DIAGNOSIS — B379 Candidiasis, unspecified: Secondary | ICD-10-CM

## 2023-02-15 MED ORDER — FLUCONAZOLE 150 MG PO TABS
150.0000 mg | ORAL_TABLET | Freq: Once | ORAL | 0 refills | Status: DC
Start: 2023-02-15 — End: 2023-03-01
  Filled 2023-02-15: qty 1, 1d supply, fill #0

## 2023-02-15 NOTE — Telephone Encounter (Signed)
Advanced Heart Failure Patient Advocate [employer], they do not show that an attestation letter has been sent out for this application. Contacted patient, she has requested attestation letter at least twice so far.  Conferenced [employer] with patient, they are emailing a copy of the attestation letter to pt today. Patient will print, sign, and drop off form with [facility].  Will continue to follow for updates.

## 2023-02-15 NOTE — Telephone Encounter (Signed)
Please advise Kh 

## 2023-02-16 ENCOUNTER — Other Ambulatory Visit: Payer: Self-pay

## 2023-02-27 NOTE — Telephone Encounter (Signed)
Spoke with patient, she is completing [employer] letter today. Will contact [employer] for update if determination letter is not received.

## 2023-03-01 ENCOUNTER — Other Ambulatory Visit: Payer: Self-pay | Admitting: Nurse Practitioner

## 2023-03-01 ENCOUNTER — Other Ambulatory Visit: Payer: Self-pay

## 2023-03-01 DIAGNOSIS — B379 Candidiasis, unspecified: Secondary | ICD-10-CM

## 2023-03-01 MED ORDER — FLUCONAZOLE 150 MG PO TABS
150.0000 mg | ORAL_TABLET | Freq: Once | ORAL | 0 refills | Status: DC
Start: 2023-03-01 — End: 2023-03-03
  Filled 2023-03-01: qty 1, 1d supply, fill #0

## 2023-03-01 NOTE — Telephone Encounter (Signed)
Please advise KH 

## 2023-03-03 ENCOUNTER — Other Ambulatory Visit: Payer: Self-pay | Admitting: Nurse Practitioner

## 2023-03-03 ENCOUNTER — Other Ambulatory Visit: Payer: Self-pay

## 2023-03-03 DIAGNOSIS — B379 Candidiasis, unspecified: Secondary | ICD-10-CM

## 2023-03-03 MED ORDER — FLUCONAZOLE 150 MG PO TABS
150.0000 mg | ORAL_TABLET | Freq: Once | ORAL | 0 refills | Status: AC
Start: 2023-03-03 — End: 2023-03-03
  Filled 2023-03-03 – 2023-03-06 (×2): qty 1, 1d supply, fill #0

## 2023-03-03 NOTE — Telephone Encounter (Signed)
Please advise Kh 

## 2023-03-06 ENCOUNTER — Other Ambulatory Visit: Payer: Self-pay

## 2023-03-06 NOTE — Telephone Encounter (Signed)
Contacted [employer] for status update. Docusign has not been returned to [employer]. Left voicemail for patient to call me with update.

## 2023-03-13 NOTE — Telephone Encounter (Signed)
 Contacted [employer] for status update. Docusign has not been returned to [employer]. Left voicemail for patient to call me with update.

## 2023-03-15 ENCOUNTER — Other Ambulatory Visit: Payer: Self-pay

## 2023-03-22 ENCOUNTER — Ambulatory Visit: Payer: Self-pay | Admitting: Nurse Practitioner

## 2023-03-29 ENCOUNTER — Encounter: Payer: Self-pay | Admitting: Nurse Practitioner

## 2023-03-29 ENCOUNTER — Ambulatory Visit (INDEPENDENT_AMBULATORY_CARE_PROVIDER_SITE_OTHER): Payer: Self-pay | Admitting: Nurse Practitioner

## 2023-03-29 ENCOUNTER — Other Ambulatory Visit: Payer: Self-pay

## 2023-03-29 VITALS — BP 110/61 | HR 69 | Resp 12 | Ht 65.0 in | Wt 228.0 lb

## 2023-03-29 DIAGNOSIS — R519 Headache, unspecified: Secondary | ICD-10-CM

## 2023-03-29 DIAGNOSIS — M545 Low back pain, unspecified: Secondary | ICD-10-CM

## 2023-03-29 DIAGNOSIS — E1165 Type 2 diabetes mellitus with hyperglycemia: Secondary | ICD-10-CM

## 2023-03-29 LAB — POCT GLYCOSYLATED HEMOGLOBIN (HGB A1C): HbA1c, POC (controlled diabetic range): 12.9 % — AB (ref 0.0–7.0)

## 2023-03-29 LAB — POCT URINALYSIS DIP (CLINITEK)
Bilirubin, UA: NEGATIVE
Blood, UA: NEGATIVE
Glucose, UA: 100 mg/dL — AB
Ketones, POC UA: NEGATIVE mg/dL
Leukocytes, UA: NEGATIVE
Nitrite, UA: NEGATIVE
POC PROTEIN,UA: NEGATIVE
Spec Grav, UA: 1.02 (ref 1.010–1.025)
Urobilinogen, UA: 0.2 U/dL
pH, UA: 7 (ref 5.0–8.0)

## 2023-03-29 MED ORDER — TRULICITY 0.75 MG/0.5ML ~~LOC~~ SOAJ
0.7500 mg | SUBCUTANEOUS | 2 refills | Status: DC
Start: 2023-03-29 — End: 2023-03-29
  Filled 2023-03-29: qty 0.5, 7d supply, fill #0

## 2023-03-29 MED ORDER — FLUCONAZOLE 150 MG PO TABS
150.0000 mg | ORAL_TABLET | Freq: Every day | ORAL | 0 refills | Status: DC
  Filled 2023-03-29: qty 1, 1d supply, fill #0

## 2023-03-29 MED ORDER — TRULICITY 0.75 MG/0.5ML ~~LOC~~ SOAJ
0.7500 mg | SUBCUTANEOUS | 2 refills | Status: DC
Start: 2023-03-29 — End: 2023-06-28
  Filled 2023-03-29: qty 2, 28d supply, fill #0
  Filled 2023-04-21 (×2): qty 2, 28d supply, fill #1
  Filled 2023-05-24: qty 2, 28d supply, fill #2

## 2023-03-29 NOTE — Patient Instructions (Signed)
1. Type 2 diabetes mellitus with hyperglycemia, unspecified whether long term insulin use (HCC)  - POCT glycosylated hemoglobin (Hb A1C) - AMB Referral to Pharmacy Medication Management - Dulaglutide (TRULICITY) 0.75 MG/0.5ML SOPN; Inject 0.75 mg into the skin once a week.  Dispense: 0.5 mL; Refill: 2 - CBC - Comprehensive metabolic panel  -strict diabetic diet  -stay active   Follow up:  Follow up in 3 months

## 2023-03-29 NOTE — Assessment & Plan Note (Signed)
-   POCT glycosylated hemoglobin (Hb A1C) - AMB Referral to Pharmacy Medication Management - Dulaglutide (TRULICITY) 0.75 MG/0.5ML SOPN; Inject 0.75 mg into the skin once a week.  Dispense: 0.5 mL; Refill: 2 - CBC - Comprehensive metabolic panel  -strict diabetic diet  -stay active   Follow up:  Follow up in 3 months

## 2023-03-29 NOTE — Progress Notes (Signed)
@Patient  ID: Maria Duncan, female    DOB: 05-23-1966, 57 y.o.   MRN: [id number]  Chief Complaint  Patient presents with   Follow-up    Referring provider: Ivonne Andrew, NP   HPI  Maria Duncan presents for follow up. 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    Diabetes:   Patient presents today for a follow  on diabetes. She is currently on lantus and metformin. Patient's husband is urging her to stop taking all of her medications and start her on a naturopathic course. We discussed that her A1c is 12.9 today and that we would strongly recommend she stay on her medications as directed.  Patient states that metformin is causing upset stomach and Lantus is causing cramps.  We will trial Trulicity.  Pharmacist will meet with patient in office in the next couple weeks.  Patient is walking daily and trying to eat healthier.  She is taking ginger and water daily denies f/c/s, n/Maria/d, hemoptysis, PND, leg swelling Denies chest pain or edema  Patient is requesting Diflucan for yeast today.  She thinks this is coming from her diabetic medications.  She also complains today of very sharp headaches to the right side of her head above her right eye.  She states that these are intermittent but she feels like she is going to pass out when this happens.  We will refer her to neurology for further evaluation and treatment.  We discussed that if she has a severe headache she does need to go to the emergency  room.    Allergies  Allergen Reactions   Hyzaar [Losartan Potassium-Hctz] Nausea And Vomiting and Cough    Patient can take Entresto now    Immunization History  Administered Date(s) Administered   Tdap 09/17/2020    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) (HCC)    Cataract    not a surgical candidate at this time (02/03/2023)   Chest pain of uncertain etiology, non obstructive CAD, pain due to acute HF 12/12/2019   CHF (congestive heart failure) (HCC)    Diabetes mellitus without complication (HCC)    Diabetic peripheral neuropathy (HCC)    bilateral feet   Heart murmur    childhood   Hyperlipidemia    on meds   Hypertension    on meds   Hypoventilation associated with obesity (HCC) 12/12/2019   Morbid obesity (HCC)    NICM (nonischemic cardiomyopathy) (HCC)    OSA (obstructive sleep apnea) 12/12/2019   uses CPAP   Pulmonary hypertension, unspecified (HCC) 12/12/2019    Tobacco History: Social History   Tobacco Use  Smoking Status Never  Smokeless Tobacco Never   Counseling given: Not Answered   Outpatient Encounter Medications as of 03/29/2023  Medication Sig   Ascorbic Acid (VITAMIN C PO) Take by mouth.   aspirin 81 MG EC tablet Take 1 tablet (81 mg total) by mouth daily.   atorvastatin (LIPITOR) 80 MG tablet Take 1 tablet (80 mg total) by mouth daily.   carvedilol (COREG) 6.25 MG tablet Take 1 tablet (6.25 mg total) by  mouth 2 (two) times daily. NEEDS FOLLOW UP APPOINTMENT FOR MORE REFILLS   diclofenac (VOLTAREN) 75 MG EC tablet Take 1 tablet (75 mg total) by mouth 2 (two) times daily.   docusate sodium (COLACE) 100 MG capsule Take 1 capsule (100 mg total) by mouth 2 (two) times daily.   fluconazole (DIFLUCAN) 150 MG tablet Take 1 tablet (150 mg total) by mouth daily.   gabapentin (NEURONTIN) 100 MG capsule Take 1 capsule (100 mg total) by mouth 3 (three) times daily.   glucose blood (TRUE METRIX  BLOOD GLUCOSE TEST) test strip Use as instructed   insulin glargine (LANTUS) 100 UNIT/ML Solostar Pen Inject 22 Units into the skin daily.   Insulin Pen Needle (TRUEPLUS 5-BEVEL PEN NEEDLES) 31G X 5 MM MISC Use daily at 2 PM.   metFORMIN (GLUCOPHAGE-XR) 500 MG 24 hr tablet Take 1 tablet (500 mg total) by mouth 2 (two) times daily with a meal.   Multiple Vitamins-Minerals (ZINC PO) Take 1 tablet by mouth daily at 6 (six) AM.   polyethylene glycol (MIRALAX / GLYCOLAX) 17 g packet Take 17 g by mouth daily as needed (constipation).   sacubitril-valsartan (ENTRESTO) 49-51 MG Take 1 tablet by mouth 2 (two) times daily.   spironolactone (ALDACTONE) 25 MG tablet Take 1 tablet (25 mg total) by mouth daily.   torsemide (DEMADEX) 20 MG tablet Take 1 tablet (20 mg total) by mouth daily. Please call to schedule follow up for any additional refills [phone] thanks (Patient taking differently: Take 20 mg by mouth daily. Please call to schedule follow up for any additional refills [phone] thanks  As needed)   TRUEplus Lancets 28G MISC Use as directed in the morning and at bedtime.   Vitamin D-Vitamin K (K2 PLUS D3 PO) Take 1 tablet by mouth daily.   [DISCONTINUED] Dulaglutide (TRULICITY) 0.75 MG/0.5ML SOPN Inject 0.75 mg into the skin once a week.   Dulaglutide (TRULICITY) 0.75 MG/0.5ML SOPN Inject 0.75 mg into the skin once a week.   No facility-administered encounter medications on file as of 03/29/2023.     Review of Systems  Review of Systems  Constitutional: Negative.   HENT: Negative.    Cardiovascular: Negative.   Gastrointestinal: Negative.   Allergic/Immunologic: Negative.   Neurological:  Positive for headaches.  Psychiatric/Behavioral: Negative.         Physical Exam  BP 110/61 (BP Location: Left Arm, Patient Position: Sitting, Cuff Size: Normal)   Pulse 69   Resp 12   Ht 5\' 5"  (1.651 m)   Wt 228 lb (103.4 kg)   SpO2 100%   BMI 37.94 kg/m   Wt Readings from Last 5  Encounters:  03/29/23 228 lb (103.4 kg)  02/03/23 236 lb (107 kg)  01/16/23 234 lb 14.4 oz (106.5 kg)  12/14/22 232 lb 9.6 oz (105.5 kg)  09/21/22 235 lb (106.6 kg)     Physical Exam Vitals and nursing note reviewed.  Constitutional:      General: She is not in acute distress.    Appearance: She is well-developed.  Cardiovascular:     Rate and Rhythm: Normal rate and regular rhythm.  Pulmonary:     Effort: Pulmonary effort is normal.     Breath sounds: Normal breath sounds.  Neurological:     Mental Status: She is alert and oriented to person, place, and time.      Lab Results:  CBC    Component Value Date/Time   WBC 6.6 09/07/2022 0918   WBC 6.1  09/01/2021 0926   RBC 4.16 09/07/2022 0918   RBC 4.45 09/01/2021 0926   HGB 12.0 09/07/2022 0918   HCT 36.7 09/07/2022 0918   PLT 218 09/07/2022 0918   MCV 88 09/07/2022 0918   MCH 28.8 09/07/2022 0918   MCH 29.2 09/01/2021 0926   MCHC 32.7 09/07/2022 0918   MCHC 34.0 09/01/2021 0926   RDW 11.7 09/07/2022 0918   LYMPHSABS 3.1 03/24/2022 1116   MONOABS 0.6 03/20/2021 0454   EOSABS 0.2 03/24/2022 1116   BASOSABS 0.0 03/24/2022 1116    BMET    Component Value Date/Time   NA 144 12/14/2022 0916   K 4.6 12/14/2022 0916   CL 105 12/14/2022 0916   CO2 27 12/14/2022 0916   GLUCOSE 171 (H) 12/14/2022 0916   GLUCOSE 661 (HH) 09/01/2021 0926   BUN 20 12/14/2022 0916   CREATININE 0.98 12/14/2022 0916   CREATININE 0.85 03/02/2014 1603   CALCIUM 9.1 12/14/2022 0916   GFRNONAA 57 (L) 09/01/2021 0926   GFRAA 66 09/17/2020 1031    BNP    Component Value Date/Time   BNP 420.3 (H) 07/27/2020 0150    ProBNP    Component Value Date/Time   PROBNP 957 (H) 12/19/2019 1626   PROBNP 150.6 (H) 03/17/2013 1331      Assessment & Plan:   Type 2 diabetes mellitus with hyperglycemia (HCC) - POCT glycosylated hemoglobin (Hb A1C) - AMB Referral to Pharmacy Medication Management - Dulaglutide (TRULICITY) 0.75 MG/0.5ML SOPN;  Inject 0.75 mg into the skin once a week.  Dispense: 0.5 mL; Refill: 2 - CBC - Comprehensive metabolic panel  -strict diabetic diet  -stay active   Follow up:  Follow up in 3 months     Ivonne Andrew, NP 03/29/2023

## 2023-03-30 ENCOUNTER — Encounter: Payer: Self-pay | Admitting: Neurology

## 2023-03-30 LAB — COMPREHENSIVE METABOLIC PANEL
ALT: 15 IU/L (ref 0–32)
AST: 16 IU/L (ref 0–40)
Albumin: 4.2 g/dL (ref 3.8–4.9)
Alkaline Phosphatase: 112 IU/L (ref 44–121)
BUN/Creatinine Ratio: 14 (ref 9–23)
BUN: 13 mg/dL (ref 6–24)
Bilirubin Total: 0.6 mg/dL (ref 0.0–1.2)
CO2: 26 mmol/L (ref 20–29)
Calcium: 9.7 mg/dL (ref 8.7–10.2)
Chloride: 99 mmol/L (ref 96–106)
Creatinine, Ser: 0.91 mg/dL (ref 0.57–1.00)
Globulin, Total: 2.5 g/dL (ref 1.5–4.5)
Glucose: 231 mg/dL — ABNORMAL HIGH (ref 70–99)
Potassium: 4.7 mmol/L (ref 3.5–5.2)
Sodium: 138 mmol/L (ref 134–144)
Total Protein: 6.7 g/dL (ref 6.0–8.5)
eGFR: 74 mL/min/{1.73_m2} (ref >59)

## 2023-03-30 LAB — CBC
Hematocrit: 38.8 % (ref 34.0–46.6)
Hemoglobin: 12.5 g/dL (ref 11.1–15.9)
MCH: 28.3 pg (ref 26.6–33.0)
MCHC: 32.2 g/dL (ref 31.5–35.7)
MCV: 88 fL (ref 79–97)
Platelets: 249 10*3/uL (ref 150–450)
RBC: 4.42 x10E6/uL (ref 3.77–5.28)
RDW: 11.8 % (ref 11.7–15.4)
WBC: 7.5 10*3/uL (ref 3.4–10.8)

## 2023-04-03 ENCOUNTER — Telehealth (HOSPITAL_COMMUNITY): Payer: Self-pay

## 2023-04-03 NOTE — Telephone Encounter (Signed)
Advanced Heart Failure Patient Advocate Encounter  Medication Samples have been left at registration desk for patient pick up. Drug name: Sherryll Burger 49/51 MG Qty: 2x 28 ct package LOT: [id number] Exp.: 12/2023 SIG: Take 1 tablet by mouth twice daily   The patient has been instructed regarding the correct time, dose, and frequency of taking this medication, including desired effects and most common side effects.   Burnell Blanks, CPhT Rx Patient Advocate Phone: [phone]

## 2023-04-03 NOTE — Telephone Encounter (Signed)
Contacted [employer] for status update. They have not received [employer]. Spoke to patient by phone, pt states docusign was completed 'some weeks ago'  Patient will print a copy of attestation letter and bring to [facility]. Will fax to [employer] once received.

## 2023-04-06 NOTE — Telephone Encounter (Signed)
Spoke to patient by phone. Previous docusign has expired. Contacted [employer] to see if they could send an updated docusign. Representative stated that the attestation letter must be mailed and is unable to resend a docusign.  [employer] is Agricultural consultant letter today 9/12, should arrive within apx 5 days. Spoke to patient by phone, she will be on lookout for the letter and will sign and bring to [facility] as soon as it is received. I will retain a copy of this letter in case of repeat issues, and submit a copy to [employer].  Updates to follow.

## 2023-04-07 ENCOUNTER — Telehealth: Payer: Self-pay | Admitting: Nurse Practitioner

## 2023-04-07 NOTE — Telephone Encounter (Signed)
Pt LVM on nurse line 04/06/23 returning Stacey's call to discuss her lab work. Please call patient back at  [phone]

## 2023-04-11 NOTE — Telephone Encounter (Signed)
Talked to patient directly about results. She understands them.

## 2023-04-18 ENCOUNTER — Other Ambulatory Visit (HOSPITAL_COMMUNITY): Payer: Self-pay | Admitting: Internal Medicine

## 2023-04-18 ENCOUNTER — Other Ambulatory Visit: Payer: Self-pay

## 2023-04-19 ENCOUNTER — Other Ambulatory Visit (HOSPITAL_COMMUNITY): Payer: Self-pay | Admitting: Nurse Practitioner

## 2023-04-19 ENCOUNTER — Other Ambulatory Visit: Payer: Self-pay

## 2023-04-19 MED ORDER — CARVEDILOL 6.25 MG PO TABS
6.2500 mg | ORAL_TABLET | Freq: Two times a day (BID) | ORAL | 0 refills | Status: DC
  Filled 2023-04-19: qty 60, 30d supply, fill #0

## 2023-04-20 ENCOUNTER — Other Ambulatory Visit: Payer: Self-pay

## 2023-04-21 ENCOUNTER — Other Ambulatory Visit: Payer: Self-pay | Admitting: Nurse Practitioner

## 2023-04-21 ENCOUNTER — Other Ambulatory Visit: Payer: Self-pay | Admitting: Podiatry

## 2023-04-21 ENCOUNTER — Other Ambulatory Visit: Payer: Self-pay

## 2023-04-21 ENCOUNTER — Other Ambulatory Visit: Payer: Self-pay | Admitting: Internal Medicine

## 2023-04-21 DIAGNOSIS — E1165 Type 2 diabetes mellitus with hyperglycemia: Secondary | ICD-10-CM

## 2023-04-21 DIAGNOSIS — G629 Polyneuropathy, unspecified: Secondary | ICD-10-CM

## 2023-04-21 MED ORDER — METFORMIN HCL ER 500 MG PO TB24
500.0000 mg | ORAL_TABLET | Freq: Two times a day (BID) | ORAL | 2 refills | Status: DC
Start: 2023-04-21 — End: 2023-09-25
  Filled 2023-04-21: qty 180, 90d supply, fill #0

## 2023-04-21 MED ORDER — GABAPENTIN 100 MG PO CAPS
100.0000 mg | ORAL_CAPSULE | Freq: Three times a day (TID) | ORAL | 3 refills | Status: DC
  Filled 2023-04-21: qty 90, 30d supply, fill #0
  Filled 2023-09-06: qty 90, 30d supply, fill #1
  Filled 2023-11-16: qty 90, 30d supply, fill #2
  Filled 2023-12-15: qty 90, 30d supply, fill #3

## 2023-04-21 MED ORDER — SPIRONOLACTONE 25 MG PO TABS
25.0000 mg | ORAL_TABLET | Freq: Every day | ORAL | 5 refills | Status: DC
  Filled 2023-04-21: qty 30, 30d supply, fill #0
  Filled 2023-07-12: qty 30, 30d supply, fill #1
  Filled 2023-08-14: qty 30, 30d supply, fill #2
  Filled 2023-09-06: qty 30, 30d supply, fill #3
  Filled 2023-11-16: qty 30, 30d supply, fill #4
  Filled 2023-12-15: qty 30, 30d supply, fill #5

## 2023-04-21 MED ORDER — DICLOFENAC SODIUM 75 MG PO TBEC
75.0000 mg | DELAYED_RELEASE_TABLET | Freq: Two times a day (BID) | ORAL | 2 refills | Status: AC
  Filled 2023-04-21: qty 50, 25d supply, fill #0
  Filled 2023-08-14: qty 50, 25d supply, fill #1
  Filled 2023-09-06: qty 50, 25d supply, fill #2

## 2023-04-25 NOTE — Telephone Encounter (Signed)
Attempt to contact patient regarding attestation letter. No answer, left voicemail.

## 2023-04-27 ENCOUNTER — Other Ambulatory Visit: Payer: Self-pay

## 2023-05-01 ENCOUNTER — Other Ambulatory Visit: Payer: Self-pay

## 2023-05-02 ENCOUNTER — Other Ambulatory Visit: Payer: Self-pay

## 2023-05-03 ENCOUNTER — Other Ambulatory Visit: Payer: Self-pay

## 2023-05-03 NOTE — Telephone Encounter (Signed)
Contacted [employer] to confirm that the attestation letter still has not been received. Spoke to patient by phone, she does now have a paper copy of the attestation letter and will drop the signed letter off at [facility] on 05/04/23 to be faxed to [employer].  Will continue to follow up.

## 2023-05-09 ENCOUNTER — Other Ambulatory Visit: Payer: Self-pay

## 2023-05-23 NOTE — Telephone Encounter (Signed)
Left voicemail for patient

## 2023-05-24 ENCOUNTER — Telehealth (HOSPITAL_COMMUNITY): Payer: Self-pay | Admitting: Cardiology

## 2023-05-24 ENCOUNTER — Other Ambulatory Visit (HOSPITAL_COMMUNITY): Payer: Self-pay | Admitting: Nurse Practitioner

## 2023-05-24 ENCOUNTER — Other Ambulatory Visit: Payer: Self-pay

## 2023-05-24 MED ORDER — CARVEDILOL 6.25 MG PO TABS
6.2500 mg | ORAL_TABLET | Freq: Two times a day (BID) | ORAL | 0 refills | Status: DC
  Filled 2023-05-24 – 2023-06-22 (×2): qty 60, 30d supply, fill #0

## 2023-05-24 NOTE — Telephone Encounter (Signed)
Medication Samples have been provided to the patient.  Drug name: entresto       Strength: 49/51        Qty: 56  LOT: [id number]  Exp.Date: 06/23/2025  Dosing instructions: one tab twice a day  The patient has been instructed regarding the correct time, dose, and frequency of taking this medication, including desired effects and most common side effects.   Theresia Bough 2:59 PM 05/24/2023

## 2023-05-25 ENCOUNTER — Other Ambulatory Visit: Payer: Self-pay

## 2023-05-26 NOTE — Telephone Encounter (Signed)
Advanced Heart Failure Patient Nurse, mental health in Attestation letter to [employer] via fax. Document scanned to chart.

## 2023-05-30 NOTE — Telephone Encounter (Signed)
Patient was approved to receive Entresto from [employer] Effective 05/29/2023 to 05/28/2024.  Left voicemail for patient.

## 2023-06-06 ENCOUNTER — Other Ambulatory Visit: Payer: Self-pay

## 2023-06-20 ENCOUNTER — Other Ambulatory Visit: Payer: Self-pay

## 2023-06-23 ENCOUNTER — Other Ambulatory Visit: Payer: Self-pay

## 2023-06-28 ENCOUNTER — Other Ambulatory Visit: Payer: Self-pay

## 2023-06-28 ENCOUNTER — Ambulatory Visit: Payer: Self-pay | Admitting: Nurse Practitioner

## 2023-06-28 ENCOUNTER — Other Ambulatory Visit: Payer: Self-pay | Admitting: Nurse Practitioner

## 2023-06-28 DIAGNOSIS — E1165 Type 2 diabetes mellitus with hyperglycemia: Secondary | ICD-10-CM

## 2023-06-28 MED ORDER — TRULICITY 0.75 MG/0.5ML ~~LOC~~ SOAJ
0.7500 mg | SUBCUTANEOUS | 2 refills | Status: DC
  Filled 2023-06-28: qty 2, 28d supply, fill #0
  Filled 2023-08-14: qty 2, 28d supply, fill #1
  Filled 2023-09-06: qty 2, 28d supply, fill #2

## 2023-06-29 ENCOUNTER — Other Ambulatory Visit: Payer: Self-pay

## 2023-07-12 ENCOUNTER — Other Ambulatory Visit: Payer: Self-pay

## 2023-08-01 ENCOUNTER — Other Ambulatory Visit: Payer: Self-pay

## 2023-08-14 ENCOUNTER — Other Ambulatory Visit: Payer: Self-pay | Admitting: Nurse Practitioner

## 2023-08-14 ENCOUNTER — Other Ambulatory Visit (HOSPITAL_COMMUNITY): Payer: Self-pay | Admitting: Nurse Practitioner

## 2023-08-14 ENCOUNTER — Other Ambulatory Visit: Payer: Self-pay

## 2023-08-14 MED ORDER — CARVEDILOL 6.25 MG PO TABS
6.2500 mg | ORAL_TABLET | Freq: Two times a day (BID) | ORAL | 0 refills | Status: DC
  Filled 2023-08-14: qty 60, 30d supply, fill #0

## 2023-08-14 MED ORDER — FLUCONAZOLE 150 MG PO TABS
150.0000 mg | ORAL_TABLET | Freq: Every day | ORAL | 0 refills | Status: DC
  Filled 2023-08-14: qty 1, 1d supply, fill #0

## 2023-08-18 NOTE — Progress Notes (Deleted)
NEUROLOGY CONSULTATION NOTE  Maria Duncan MRN: [id number] DOB: Aug 08, 1965  Referring provider: Angus Seller, NP Primary care provider: Angus Seller, NP  Reason for consult:  headache  Assessment/Plan:   ***   Subjective:  Maria Duncan is a 58 year old ***-handed female with CHF, DM 2 with polyneuropathy, HTN, HLD, pulmonary HTN and OSA who presents for headaches.  History supplemented by referring provider's note.  ***   Current NSAIDS/analgesics:  ASA 81mg  daily, diclofenac Current Antihypertensive medications:  carvedilol, toresemide Current Anticonvulsant medications:  gabapentin 100mg  TID (neuropathy) Other medications:  Trulicity     PAST MEDICAL HISTORY: 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) (HCC)    Cataract    not a surgical candidate at this time (02/03/2023)   Chest pain of uncertain etiology, non obstructive CAD, pain due to acute HF 12/12/2019   CHF (congestive heart failure) (HCC)    Diabetes mellitus without complication (HCC)    Diabetic peripheral neuropathy (HCC)    bilateral feet   Heart murmur    childhood   Hyperlipidemia    on meds   Hypertension    on meds   Hypoventilation associated with obesity (HCC) 12/12/2019   Morbid obesity (HCC)    NICM (nonischemic cardiomyopathy) (HCC)    OSA (obstructive sleep apnea) 12/12/2019   uses CPAP   Pulmonary hypertension, unspecified (HCC) 12/12/2019    PAST SURGICAL HISTORY: Past Surgical History:  Procedure Laterality Date   BREAST BIOPSY Left    per pt benign   CESAREAN SECTION  1986   PARTIAL HYSTERECTOMY     RIGHT/LEFT HEART CATH AND CORONARY ANGIOGRAPHY N/A 12/09/2019   Procedure: RIGHT/LEFT HEART CATH AND CORONARY ANGIOGRAPHY;  Surgeon: Dolores Patty, MD;  Location: [facility];  Service: Cardiovascular;  Laterality: N/A;   UTERINE FIBROID SURGERY     WISDOM TOOTH EXTRACTION      MEDICATIONS: Current  Outpatient Medications on File Prior to Visit  Medication Sig Dispense Refill   Ascorbic Acid (VITAMIN C PO) Take by mouth.     aspirin 81 MG EC tablet Take 1 tablet (81 mg total) by mouth daily. 90 tablet 3   atorvastatin (LIPITOR) 80 MG tablet Take 1 tablet (80 mg total) by mouth daily. 90 tablet 3   carvedilol (COREG) 6.25 MG tablet Take 1 tablet (6.25 mg total) by mouth 2 (two) times daily. NEEDS FOLLOW UP APPOINTMENT FOR MORE REFILLS 60 tablet 0   diclofenac (VOLTAREN) 75 MG EC tablet Take 1 tablet (75 mg total) by mouth 2 (two) times daily. 50 tablet 2   docusate sodium (COLACE) 100 MG capsule Take 1 capsule (100 mg total) by mouth 2 (two) times daily. 10 capsule 0   Dulaglutide (TRULICITY) 0.75 MG/0.5ML SOAJ Inject 0.75 mg into the skin once a week. 2 mL 2   fluconazole (DIFLUCAN) 150 MG tablet Take 1 tablet (150 mg total) by mouth daily. 1 tablet 0   gabapentin (NEURONTIN) 100 MG capsule Take 1 capsule (100 mg total) by mouth 3 (three) times daily. 90 capsule 3   glucose blood (TRUE METRIX BLOOD GLUCOSE TEST) test strip Use as instructed 100 each 12   insulin glargine (LANTUS) 100 UNIT/ML Solostar Pen Inject 22 Units into the skin daily. 15 mL 2   Insulin Pen Needle (TRUEPLUS 5-BEVEL PEN NEEDLES) 31G X 5 MM MISC Use daily at 2 PM. 100 each 11   metFORMIN (GLUCOPHAGE-XR) 500 MG  24 hr tablet Take 1 tablet (500 mg total) by mouth 2 (two) times daily with a meal. 180 tablet 2   Multiple Vitamins-Minerals (ZINC PO) Take 1 tablet by mouth daily at 6 (six) AM.     polyethylene glycol (MIRALAX / GLYCOLAX) 17 g packet Take 17 g by mouth daily as needed (constipation). 14 each 0   sacubitril-valsartan (ENTRESTO) 49-51 MG Take 1 tablet by mouth 2 (two) times daily. 180 tablet 3   spironolactone (ALDACTONE) 25 MG tablet Take 1 tablet (25 mg total) by mouth daily. NEEDS FOLLOW UP FOR ANYMORE REFILLS 30 tablet 5   torsemide (DEMADEX) 20 MG tablet Take 1 tablet (20 mg total) by mouth daily. Please call  to schedule follow up for any additional refills [phone] thanks (Patient taking differently: Take 20 mg by mouth daily. Please call to schedule follow up for any additional refills [phone] thanks  As needed) 30 tablet 3   TRUEplus Lancets 28G MISC Use as directed in the morning and at bedtime. 100 each 1   Vitamin D-Vitamin K (K2 PLUS D3 PO) Take 1 tablet by mouth daily.     No current facility-administered medications on file prior to visit.    ALLERGIES: Allergies  Allergen Reactions   Hyzaar [Losartan Potassium-Hctz] Nausea And Vomiting and Cough    Patient can take Entresto now    FAMILY HISTORY: Family History  Problem Relation Age of Onset   Colon polyps Mother 40   Breast cancer Mother 55   Hyperlipidemia Mother    Diabetes Father    Hypertension Father    Hyperlipidemia Sister    Hypertension Sister    Hyperlipidemia Brother    Hypertension Brother    Cancer Maternal Grandmother    Stroke Maternal Grandmother    Esophageal cancer Maternal Grandfather 17   Colon cancer Maternal Grandfather 72   Colon polyps Maternal Grandfather 72   Cancer Maternal Grandfather    Diabetes Maternal Grandfather    Heart disease Maternal Grandfather    Hypertension Maternal Grandfather    Stomach cancer Paternal Grandmother 23   Diabetes Paternal Grandmother    Hypertension Paternal Grandmother    Cancer Paternal Grandfather    Hypertension Paternal Grandfather    Stroke Paternal Grandfather    Rectal cancer Neg Hx     Objective:  *** General: No acute distress.  Patient appears well-groomed.   Head:  Normocephalic/atraumatic Eyes:  fundi examined but not visualized Neck: supple, no paraspinal tenderness, full range of motion Back: No paraspinal tenderness Heart: regular rate and rhythm Lungs: Clear to auscultation bilaterally. Vascular: No carotid bruits. Neurological Exam: Mental status: alert and oriented to person, place, and time, speech fluent and not  dysarthric, language intact. Cranial nerves: CN I: not tested CN II: pupils equal, round and reactive to light, visual fields intact CN III, IV, VI:  full range of motion, no nystagmus, no ptosis CN V: facial sensation intact. CN VII: upper and lower face symmetric CN VIII: hearing intact CN IX, X: gag intact, uvula midline CN XI: sternocleidomastoid and trapezius muscles intact CN XII: tongue midline Bulk & Tone: normal, no fasciculations. Motor:  muscle strength 5/5 throughout Sensation:  Pinprick, temperature and vibratory sensation intact. Deep Tendon Reflexes:  2+ throughout,  toes downgoing.   Finger to nose testing:  Without dysmetria.   Heel to shin:  Without dysmetria.   Gait:  Normal station and stride.  Romberg negative.    Thank you for allowing me to take part  in the care of this patient.  Shon Millet, DO  CC: ***

## 2023-08-21 ENCOUNTER — Ambulatory Visit: Payer: Self-pay | Admitting: Neurology

## 2023-08-21 ENCOUNTER — Encounter: Payer: Self-pay | Admitting: Neurology

## 2023-08-21 DIAGNOSIS — Z029 Encounter for administrative examinations, unspecified: Secondary | ICD-10-CM

## 2023-08-30 ENCOUNTER — Ambulatory Visit: Payer: Self-pay | Admitting: Nurse Practitioner

## 2023-09-06 ENCOUNTER — Other Ambulatory Visit: Payer: Self-pay

## 2023-09-25 ENCOUNTER — Other Ambulatory Visit: Payer: Self-pay

## 2023-09-25 DIAGNOSIS — E1165 Type 2 diabetes mellitus with hyperglycemia: Secondary | ICD-10-CM

## 2023-09-25 MED ORDER — TRULICITY 1.5 MG/0.5ML ~~LOC~~ SOAJ
1.5000 mg | SUBCUTANEOUS | 2 refills | Status: DC
  Filled 2023-09-25: qty 2, 28d supply, fill #0
  Filled 2023-10-23: qty 2, 28d supply, fill #1
  Filled 2024-02-08 – 2024-04-08 (×2): qty 2, 28d supply, fill #2

## 2023-09-25 NOTE — Progress Notes (Signed)
 09/25/2023 Name: Maria Duncan MRN: [id number] DOB: 1966-05-08  Chief Complaint  Patient presents with   Diabetes   Medication Management    Maria Duncan is a 58 y.o. year old female who presented for a telephone visit.   They were referred to the pharmacist by their PCP for assistance in managing diabetes.    Subjective:  Care Team: Primary Care Provider: Ivonne Andrew, NP ; Next Scheduled Visit: 10/02/2023 Cardiologist: Dr. Gala Romney; Next Scheduled Visit: not scheduled  Medication Access/Adherence  Current Pharmacy:  [facility] - [facility] [address], [address] [place] [address] Phone: [phone] Fax: [phone]   - [facility] [address] [place] [address] Phone: [phone] Fax: [phone]  [facility] [place], [place] - [address] [place] [address] Phone: [phone] Fax: [phone]  [id number] [place], [place] - [address] AT [facility] [address] [place] [address] Phone: [phone] Fax: [phone]  [id number] - [place], [place] - [address] AT [facility] [address] [facility] [address] [facility] [address] [place] [address] Phone: [phone] Fax: [phone]   Patient reports affordability concerns with their medications: No  Patient reports access/transportation concerns to their pharmacy: No  Patient reports adherence concerns with their medications:  No     Diabetes:  Current medications: Trulicity 0.75 mg weekly, Lantus 22 units daily (not taking), metformin XR 500 mg BID (not taking- caused stomach cramps)  Tolerating Trulicity well. Reports occasional GI upset (not bothersome to her), and otherwise denies nausea and constipation.  Using glucometer; testing two times weekly FBG: 90-125  Patient denies hypoglycemic s/sx  including dizziness, shakiness, sweating. Patient reports hyperglycemic symptoms including nocturia, neuropathy, and occasional blurry vision. Patient endorses urinating every hour overnight.  Current meal patterns: 3 meals/day - Breakfast: oatmeal, waffles, eggs, [place] bacon or [place] sausage, vegan meats - Lunch: [place] and cheese sandwich, fruit bowl, acai bowl, egg, granola, tuna and crackers, salads - Supper: baked [place], chicken, fish, green beans, collards, okra - Snacks: apple sauce, yogurt, carrots, cucumber with light ranch, grapes, berries - Drinks: water only  Current physical activity: used to go for walks, but has had difficulty recently due to ankle pain, trying chair exercises  Current medication access support: Currently uninsured receiving some meds via [facility] supply at [facility]. She reports currently trying to enroll in [place].   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 Risk Factors: T2DM, HLD, HTN   Heart Failure (EF 55-60%):  Current medications:  ACEi/ARB/ARNI: Entresto 49-51 mg BID SGLT2i: none (has periodic yeast infections) Beta blocker: carvedilol 6.25 mg BID Mineralocorticoid Receptor Antagonist: spironolactone 25 mg daily Diuretic regimen: torsemide 20 mg daily - taking prn, but has not used lately as it causes cramping  Current home blood pressure readings: does not check Current home weights: 220-223 lbs, checks every other day  Patient denies volume overload signs or symptoms including shortness of breath, lower extremity edema, increased use of pillows at night.  Objective:  Lab Results  Component Value Date   HGBA1C 12.9 (A) 03/29/2023    Lab Results  Component Value Date   CREATININE 0.91 03/29/2023   BUN 13 03/29/2023   NA 138 03/29/2023   K 4.7  03/29/2023   CL 99 03/29/2023   CO2 26  03/29/2023    Lab Results  Component Value Date   CHOL 167 09/07/2022   HDL 62 09/07/2022   LDLCALC 92 09/07/2022   TRIG 65 09/07/2022   CHOLHDL 2.7 09/07/2022    Medications Reviewed Today     Reviewed by Vela Prose, RPH (Pharmacist) on 09/25/23 at 1044  Med List Status: <None>   Medication Order Taking? Sig Documenting Provider Last Dose Status Informant  aspirin 81 MG EC tablet [id number] Yes Take 1 tablet (81 mg total) by mouth daily. Duke Salvia, MD Taking Active Self  atorvastatin (LIPITOR) 80 MG tablet [id number] Yes Take 1 tablet (80 mg total) by mouth daily. [place], Anderson Malta, FNP Taking Active   carvedilol (COREG) 6.25 MG tablet [id number] Yes Take 1 tablet (6.25 mg total) by mouth 2 (two) times daily. NEEDS FOLLOW UP APPOINTMENT FOR MORE REFILLS Ivonne Andrew, NP Taking Active   diclofenac (VOLTAREN) 75 MG EC tablet [id number] Yes Take 1 tablet (75 mg total) by mouth 2 (two) times daily. Standiford, Jenelle Mages, DPM Taking Active   docusate sodium (COLACE) 100 MG capsule [id number] Yes Take 1 capsule (100 mg total) by mouth 2 (two) times daily. Leone Brand, NP Taking Active Self  Dulaglutide (TRULICITY) 0.75 MG/0.5ML Ivory Broad [id number] Yes Inject 0.75 mg into the skin once a week. Ivonne Andrew, NP Taking Active   gabapentin (NEURONTIN) 100 MG capsule [id number] Yes Take 1 capsule (100 mg total) by mouth 3 (three) times daily. Ivonne Andrew, NP Taking Active            Med Note Vela Prose   Mon Sep 25, 2023 10:04 AM) Taking differently - takes 1 capsule twice daily  glucose blood (TRUE METRIX BLOOD GLUCOSE TEST) test strip [id number]  Use as instructed Barbette Merino, NP  Active   Patient not taking:  Discontinued 09/25/23 1044 (Change in therapy)   Insulin Pen Needle (TRUEPLUS 5-BEVEL PEN NEEDLES) 31G X 5 MM MISC [id number]  Use daily at 2 PM. Ivonne Andrew, NP  Active   Patient not taking:  Discontinued 09/25/23 1044 (Side effect (s))   Multiple  Vitamins-Minerals (ZINC PO) [id number] Yes Take 1 tablet by mouth daily at 6 (six) AM. [provider] Taking Active   polyethylene glycol (MIRALAX / GLYCOLAX) 17 g packet [id number] Yes Take 17 g by mouth daily as needed (constipation). Leone Brand, NP Taking Active            Med Note Renelda Loma   Wed Sep 07, 2022  8:33 AM) Prn   sacubitril-valsartan [facility]) 49-51 MG [id number] Yes Take 1 tablet by mouth 2 (two) times daily. Bensimhon, Bevelyn Buckles, MD Taking Active   spironolactone (ALDACTONE) 25 MG tablet [id number] Yes Take 1 tablet (25 mg total) by mouth daily. NEEDS FOLLOW UP FOR ANYMORE REFILLS Bensimhon, Bevelyn Buckles, MD Taking Active   torsemide (DEMADEX) 20 MG tablet [id number] No Take 1 tablet (20 mg total) by mouth daily. Please call to schedule follow up for any additional refills [phone] thanks  Patient not taking: Reported on 09/25/2023   Laurey Morale, MD Not Taking Active            Med Note Clearance Coots, Desma Paganini Sep 14, 2022  9:42 AM) PRN  TRUEplus Lancets 28G MISC [id number]  Use as directed in the morning and at bedtime. Barbette Merino, NP  Active  Vitamin D-Vitamin K (K2 PLUS D3 PO) [id number] Yes Take 1 tablet by mouth daily. [provider] Taking Active               Assessment/Plan:   Diabetes: - Currently uncontrolled based on last A1c 12.9% on 03/29/23. Patient reported FBG are at goal, but she only checks a couple times a week and is reporting symptoms of hyperglycemia. Will increase the dose of Trulicity as she is tolerating well. Encouraged patient to monitor BG daily. Patient concerned that her diabetes medications are causing her foot/ankle pain. States pain feels different than her neuropathy and we discussed that this is not a typical side effect of Trulicity. Recommended she discuss at PCP visit next Monday. - Reviewed long term cardiovascular and renal outcomes of uncontrolled blood sugar - Reviewed goal A1c, goal fasting,  and goal 2 hour post prandial glucose - Reviewed dietary modifications including incorporating protein with each meal and limiting portion size of carbohydrates - Reviewed lifestyle modifications including: encouraged physical activity as able, although currently limited by foot/ankle pain - Recommend to increase Trulicity to 1.5 mg once weekly. Will collaborate with PCP to implement change. - Patient denies personal or family history of multiple endocrine neoplasia type 2, medullary thyroid cancer; personal history of pancreatitis or gallbladder disease. - Recommend to check glucose daily - Counseled patient on Medicaid enrollment office on 4th floor of [facility] and she stated she would go this Wednesday for assistance. - A1c due at PCP visit next Monday, 3/10     Hyperlipidemia/ASCVD Risk Reduction: - Currently uncontrolled with last LDL 92 mg/dl on 1/47/82. Patient is due for updated lipid panel. Will re-evaluate control at next visit. - Reviewed long term complications of uncontrolled cholesterol - Recommend to continue atorvastatin 80 mg daily  - Lipid panel due at PCP visit next Monday, 3/10    Heart Failure: - Currently appropriately managed - Reviewed appropriate blood pressure monitoring technique and reviewed goal blood pressure - Reviewed to weigh daily and when to contact cardiology with weight gain - Ideally would restart SGLT-2 however patient currently with periodic yeast infections. Will focus on improving DM control then reconsider. - Recommend to continue current regimen. Appears patient has not a cardiology visit in >1 year. Multiple meds with instructions to schedule follow up visit for further refills. Encouraged patient to call to determine if she needs to follow up with [facility] or general cardiology and schedule appt.   Follow Up Plan: PCP visit on 10/02/23 and PharmD on 10/23/23  Jarrett Ables, PharmD PGY-1 Pharmacy Resident

## 2023-09-26 ENCOUNTER — Other Ambulatory Visit: Payer: Self-pay

## 2023-09-27 ENCOUNTER — Other Ambulatory Visit: Payer: Self-pay

## 2023-09-29 ENCOUNTER — Ambulatory Visit: Payer: Self-pay | Admitting: Nurse Practitioner

## 2023-10-02 ENCOUNTER — Ambulatory Visit: Payer: Self-pay | Admitting: Nurse Practitioner

## 2023-10-05 ENCOUNTER — Ambulatory Visit: Payer: Self-pay | Admitting: Nurse Practitioner

## 2023-10-05 ENCOUNTER — Other Ambulatory Visit: Payer: Self-pay | Admitting: Nurse Practitioner

## 2023-10-05 ENCOUNTER — Other Ambulatory Visit: Payer: Self-pay

## 2023-10-05 MED ORDER — HYDROXYZINE HCL 10 MG PO TABS
10.0000 mg | ORAL_TABLET | Freq: Three times a day (TID) | ORAL | 0 refills | Status: DC | PRN
Start: 2023-10-05 — End: 2023-12-20
  Filled 2023-10-05: qty 30, 10d supply, fill #0

## 2023-10-05 NOTE — Telephone Encounter (Signed)
 Chief Complaint: cough Symptoms: cough, headache, sinus pressure, bodyaches Frequency: 5 days Pertinent Negatives: Patient denies difficulty breathing, chest pain Disposition: [] ED /[] Urgent Care (no appt availability in office) / [x] Appointment(In office/virtual)/ []  [facility]/ [] Home Care/ [] Refused Recommended Disposition /[] [facility]/ []  Follow-up with PCP Additional Notes: Patient states that she has had a productive cough with yellow phlegm for 5 days, also body aches, headache, and sinus pressure. She states that she was running a fever the first couple of days. Patient states she is taking Mucinex and tylenol.    Copied from CRM [id number]. Topic: Clinical - Red Word Triage >> Oct 05, 2023  9:19 AM Nyra Capes wrote: Red Word that prompted transfer to Nurse Triage: Patient is out of town canceled appt for today at 9:20am and will be back on Monday, Patient has sinus pressure, headache, coughing up dark yellow stuff, fever, body aches. Patient is diabetic reading this morning 165 ,  and high BP, taking medication Reason for Disposition  Cough has been present for > 3 weeks  Answer Assessment - Initial Assessment Questions 1. ONSET: "When did the cough begin?"      5 days 2. SEVERITY: "How bad is the cough today?"      mod 3. SPUTUM: "Describe the color of your sputum" (none, dry cough; clear, white, yellow, green)     Yellow thick 4. HEMOPTYSIS: "Are you coughing up any blood?" If so ask: "How much?" (flecks, streaks, tablespoons, etc.)     no 5. DIFFICULTY BREATHING: "Are you having difficulty breathing?" If Yes, ask: "How bad is it?" (e.g., mild, moderate, severe)    - MILD: No SOB at rest, mild SOB with walking, speaks normally in sentences, can lie down, no retractions, pulse < 100.    - MODERATE: SOB at rest, SOB with minimal exertion and prefers to sit, cannot lie down flat, speaks in phrases, mild retractions, audible wheezing, pulse 100-120.    - SEVERE:  Very SOB at rest, speaks in single words, struggling to breathe, sitting hunched forward, retractions, pulse > 120      Was in the beginning but not now 6. FEVER: "Do you have a fever?" If Yes, ask: "What is your temperature, how was it measured, and when did it start?"     no 7. CARDIAC HISTORY: "Do you have any history of heart disease?" (e.g., heart attack, congestive heart failure)  chf 8. LUNG HISTORY: "Do you have any history of lung disease?"  (e.g., pulmonary embolus, asthma, emphysema)     no 9. PE RISK FACTORS: "Do you have a history of blood clots?" (or: recent major surgery, recent prolonged travel, bedridden)     no 10. OTHER SYMPTOMS: "Do you have any other symptoms?" (e.g., runny nose, wheezing, chest pain)       Headache, sinus pressure, bodyaches,  Protocols used: Cough - Acute Productive-A-AH

## 2023-10-09 ENCOUNTER — Ambulatory Visit: Payer: Self-pay | Admitting: Family Medicine

## 2023-10-11 ENCOUNTER — Other Ambulatory Visit (HOSPITAL_COMMUNITY): Payer: Self-pay | Admitting: Nurse Practitioner

## 2023-10-11 ENCOUNTER — Other Ambulatory Visit: Payer: Self-pay

## 2023-10-12 ENCOUNTER — Other Ambulatory Visit: Payer: Self-pay

## 2023-10-12 MED ORDER — CARVEDILOL 6.25 MG PO TABS
6.2500 mg | ORAL_TABLET | Freq: Two times a day (BID) | ORAL | 0 refills | Status: DC
  Filled 2023-10-12: qty 60, 30d supply, fill #0

## 2023-10-23 ENCOUNTER — Other Ambulatory Visit: Payer: Self-pay

## 2023-10-31 ENCOUNTER — Other Ambulatory Visit: Payer: Self-pay

## 2023-10-31 NOTE — Progress Notes (Unsigned)
 10/31/2023 Name: Maria Duncan MRN: [id number] DOB: 09-10-1965  No chief complaint on file.   Maria Duncan is a 58 y.o. year old female who presented for a telephone visit.   They were referred to the pharmacist by their PCP for assistance in managing diabetes. PMH includes CHF(last EF 55-60% in 2022, recovered from 35-40% in 2021), HTN, nonobstructive CAD, OSA, T2DM, obesity    Subjective:  Care Team: Primary Care Provider: Ivonne Andrew, NP ; Next Scheduled Visit: 10/02/2023 Cardiologist: Dr. Gala Romney; Next Scheduled Visit: not scheduled  Medication Access/Adherence  Current Pharmacy:  [facility] - [facility] [address] [place] [address] Phone: [phone] Fax: [phone]  [place] - [facility] [address] [place] [address] Phone: [phone] Fax: [phone]  [facility] [place], [place] - [address] [place] [address] Phone: [phone] Fax: [phone]  [id number] [place], [place] - [address] AT [facility] [address] [place] [address] Phone: [phone] Fax: [phone]  [id number] - [place], [place] - [address] AT [facility] [address] & [address] [place] [address] Phone: [phone] Fax: [phone]   Patient reports affordability concerns with their medications: No  Patient reports access/transportation concerns to their pharmacy: No  Patient reports adherence concerns with their medications:  No     Diabetes:  Current medications: Trulicity 0.75 mg weekly, Lantus 22 units daily (not taking), metformin XR 500 mg BID (not taking- caused stomach cramps)  Tolerating Trulicity well. Reports occasional GI upset (not bothersome to her), and otherwise denies nausea and constipation.  Using glucometer; testing  two times weekly FBG: 90-125  Patient denies hypoglycemic s/sx including dizziness, shakiness, sweating. Patient reports hyperglycemic symptoms including nocturia, neuropathy, and occasional blurry vision. Patient endorses urinating every hour overnight.  Current meal patterns: 3 meals/day - Breakfast: oatmeal, waffles, eggs, [place] bacon or [place] sausage, vegan meats - Lunch: [place] and cheese sandwich, fruit bowl, acai bowl, egg, granola, tuna and crackers, salads - Supper: baked [place], chicken, fish, green beans, collards, okra - Snacks: apple sauce, yogurt, carrots, cucumber with light ranch, grapes, berries - Drinks: water only  Current physical activity: used to go for walks, but has had difficulty recently due to ankle pain, trying chair exercises  Current medication access support: Currently uninsured receiving some meds via DOH supply at [facility]. She reports currently trying to enroll in [place].   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 Risk Factors: T2DM, HLD, HTN   Heart Failure (EF 55-60%):  Current medications:  ACEi/ARB/ARNI: Entresto 49-51 mg BID SGLT2i: none (has periodic yeast infections) Beta blocker: carvedilol 6.25 mg BID Mineralocorticoid Receptor Antagonist: spironolactone 25 mg daily Diuretic regimen: torsemide 20 mg daily - taking prn, but has not used lately as it causes cramping  Current home blood pressure readings: does not check Current home weights: 220-223 lbs, checks every other day  Patient denies volume overload signs or symptoms including shortness of breath, lower extremity edema, increased use of pillows at night.  Objective:  BP Readings from Last 3 Encounters:  03/29/23 110/61  01/16/23 (!) 146/84  12/14/22 114/60    Lab Results  Component Value Date   HGBA1C 12.9 (A)  03/29/2023    Lab Results  Component Value Date   CREATININE 0.91 03/29/2023   BUN 13 03/29/2023   NA 138 03/29/2023   K 4.7 03/29/2023   CL 99 03/29/2023   CO2 26 03/29/2023    Lab Results  Component Value Date   CHOL 167 09/07/2022   HDL 62 09/07/2022   LDLCALC 92 09/07/2022   TRIG 65 09/07/2022   CHOLHDL 2.7 09/07/2022    Medications Reviewed Today   Medications were not reviewed in this encounter     Incr Trulicity? Medicaid? RS PCP? Still getting Entresto via PAP?  Assessment/Plan:   Diabetes: - Currently uncontrolled based on last A1c 12.9% on 03/29/23. Patient reported FBG are at goal, but she only checks a couple times a week and is reporting symptoms of hyperglycemia. Will increase the dose of Trulicity as she is tolerating well. Encouraged patient to monitor BG daily. Patient concerned that her diabetes medications are causing her foot/ankle pain. States pain feels different than her neuropathy and we discussed that this is not a typical side effect of Trulicity. Recommended she discuss at PCP visit next Monday. - Reviewed long term cardiovascular and renal outcomes of uncontrolled blood sugar - Reviewed goal A1c, goal fasting, and goal 2 hour post prandial glucose - Reviewed dietary modifications including incorporating protein with each meal and limiting portion size of carbohydrates - Reviewed lifestyle modifications including: encouraged physical activity as able, although currently limited by foot/ankle pain - Recommend to increase Trulicity to 1.5 mg once weekly. Will collaborate with PCP to implement change. - Patient denies personal or family history of multiple endocrine neoplasia type 2, medullary thyroid cancer; personal history of pancreatitis or gallbladder disease. - Recommend to check glucose daily - Counseled patient on Medicaid enrollment office on 4th floor of [facility] and she stated she would go this Wednesday for assistance. - A1c  due at PCP visit next Monday, 3/10     Hyperlipidemia/ASCVD Risk Reduction: - Currently uncontrolled with last LDL 92 mg/dl on 11/01/79. Patient is due for updated lipid panel. Will re-evaluate control at next visit. - Reviewed long term complications of uncontrolled cholesterol - Recommend to continue atorvastatin 80 mg daily  - Lipid panel due at PCP visit next Monday, 3/10    Heart Failure: - Currently appropriately managed - Reviewed appropriate blood pressure monitoring technique and reviewed goal blood pressure - Reviewed to weigh daily and when to contact cardiology with weight gain - Ideally would restart SGLT-2 however patient currently with periodic yeast infections. Will focus on improving DM control then reconsider. - Recommend to continue current regimen. Appears patient has not a cardiology visit in >1 year. Multiple meds with instructions to schedule follow up visit for further refills. Encouraged patient to call to determine if she needs to follow up with [facility] or general cardiology and schedule appt.   Follow Up Plan: PCP visit on 10/02/23 and PharmD on 10/23/23  Jarrett Ables, PharmD PGY-1 Pharmacy Resident

## 2023-11-12 ENCOUNTER — Encounter (HOSPITAL_BASED_OUTPATIENT_CLINIC_OR_DEPARTMENT_OTHER): Payer: Self-pay

## 2023-11-12 ENCOUNTER — Emergency Department (HOSPITAL_BASED_OUTPATIENT_CLINIC_OR_DEPARTMENT_OTHER): Payer: Self-pay

## 2023-11-12 ENCOUNTER — Emergency Department (HOSPITAL_BASED_OUTPATIENT_CLINIC_OR_DEPARTMENT_OTHER)
Admission: EM | Admit: 2023-11-12 | Discharge: 2023-11-12 | Disposition: A | Discharge status: Home / Self Care | Payer: Self-pay | Attending: Emergency Medicine | Admitting: Emergency Medicine

## 2023-11-12 ENCOUNTER — Other Ambulatory Visit: Payer: Self-pay

## 2023-11-12 DIAGNOSIS — Z794 Long term (current) use of insulin: Secondary | ICD-10-CM | POA: Insufficient documentation

## 2023-11-12 DIAGNOSIS — I5030 Unspecified diastolic (congestive) heart failure: Secondary | ICD-10-CM | POA: Insufficient documentation

## 2023-11-12 DIAGNOSIS — R0789 Other chest pain: Secondary | ICD-10-CM | POA: Insufficient documentation

## 2023-11-12 DIAGNOSIS — R1013 Epigastric pain: Secondary | ICD-10-CM | POA: Insufficient documentation

## 2023-11-12 DIAGNOSIS — Z7982 Long term (current) use of aspirin: Secondary | ICD-10-CM | POA: Diagnosis not present

## 2023-11-12 LAB — CBC
HCT: 38.1 % (ref 36.0–46.0)
Hemoglobin: 12.5 g/dL (ref 12.0–15.0)
MCH: 28.3 pg (ref 26.0–34.0)
MCHC: 32.8 g/dL (ref 30.0–36.0)
MCV: 86.4 fL (ref 80.0–100.0)
Platelets: 232 10*3/uL (ref 150–400)
RBC: 4.41 MIL/uL (ref 3.87–5.11)
RDW: 13.1 % (ref 11.5–15.5)
WBC: 9.5 10*3/uL (ref 4.0–10.5)
nRBC: 0 % (ref 0.0–0.2)

## 2023-11-12 LAB — HEPATIC FUNCTION PANEL
ALT: 23 U/L (ref 0–44)
AST: 20 U/L (ref 15–41)
Albumin: 4.4 g/dL (ref 3.5–5.0)
Alkaline Phosphatase: 96 U/L (ref 38–126)
Bilirubin, Direct: 0.1 mg/dL (ref 0.0–0.2)
Indirect Bilirubin: 0.8 mg/dL (ref 0.3–0.9)
Total Bilirubin: 0.9 mg/dL (ref 0.0–1.2)
Total Protein: 7.7 g/dL (ref 6.5–8.1)

## 2023-11-12 LAB — BASIC METABOLIC PANEL WITH GFR
Anion gap: 9 (ref 5–15)
BUN: 31 mg/dL — ABNORMAL HIGH (ref 6–20)
CO2: 32 mmol/L (ref 22–32)
Calcium: 9.7 mg/dL (ref 8.9–10.3)
Chloride: 96 mmol/L — ABNORMAL LOW (ref 98–111)
Creatinine, Ser: 1.38 mg/dL — ABNORMAL HIGH (ref 0.44–1.00)
GFR, Estimated: 45 mL/min — ABNORMAL LOW (ref >60)
Glucose, Bld: 182 mg/dL — ABNORMAL HIGH (ref 70–99)
Potassium: 4 mmol/L (ref 3.5–5.1)
Sodium: 137 mmol/L (ref 135–145)

## 2023-11-12 LAB — TROPONIN I (HIGH SENSITIVITY)
Troponin I (High Sensitivity): 5 ng/L (ref <18)
Troponin I (High Sensitivity): 4 ng/L (ref <18)

## 2023-11-12 LAB — LIPASE, BLOOD: Lipase: 22 U/L (ref 11–51)

## 2023-11-12 NOTE — ED Triage Notes (Signed)
 Pt states that she began to have CP with radiation to back and SOB that started an hour ago.

## 2023-11-12 NOTE — ED Provider Notes (Signed)
 [facility]ERG[facility] DEPARTMENT AT [facility]  Provider Note  CSN: [id number] Arrival date & time: 11/12/23 0315  History Chief Complaint  Patient presents with   Chest Pain    Maria Duncan is a 58 y.o. female with history of NICM, HFpEF but no known CAD reports she has had about 1-2 hours of midsternal and epigastric discomfort, radiating into her back, worse with movement and deep breath. No fever, mild cough.    Home Medications Prior to Admission medications   Medication Sig Start Date End Date Taking? Authorizing Provider  aspirin  81 MG EC tablet Take 1 tablet (81 mg total) by mouth daily. 12/20/19   Verona Goodwill, MD  atorvastatin  (LIPITOR) 80 MG tablet Take 1 tablet (80 mg total) by mouth daily. 10/19/22   Milford, Arlice Bene, FNP  carvedilol  (COREG ) 6.25 MG tablet Take 1 tablet (6.25 mg total) by mouth 2 (two) times daily. NEEDS FOLLOW UP APPOINTMENT FOR MORE REFILLS 10/12/23   Jerrlyn Morel, NP  diclofenac  (VOLTAREN ) 75 MG EC tablet Take 1 tablet (75 mg total) by mouth 2 (two) times daily. 04/21/23   Standiford, Karlene Overcast, DPM  docusate sodium  (COLACE) 100 MG capsule Take 1 capsule (100 mg total) by mouth 2 (two) times daily. 12/12/19   Onetha Bile, NP  Dulaglutide  (TRULICITY ) 1.5 MG/0.5ML SOAJ Inject 1.5 mg into the skin once a week. 09/25/23   Jerrlyn Morel, NP  gabapentin  (NEURONTIN ) 100 MG capsule Take 1 capsule (100 mg total) by mouth 3 (three) times daily. 04/21/23   Jerrlyn Morel, NP  glucose blood (TRUE METRIX BLOOD GLUCOSE TEST) test strip Use as instructed 10/15/21   Gregoria Leas, NP  hydrOXYzine  (ATARAX ) 10 MG tablet Take 1 tablet (10 mg total) by mouth 3 (three) times daily as needed. 10/05/23   Jerrlyn Morel, NP  Insulin  Pen Needle (TRUEPLUS 5-BEVEL PEN NEEDLES) 31G X 5 MM MISC Use daily at 2 PM. 10/14/22   Nichols, Tonya S, NP  Multiple Vitamins-Minerals (ZINC  PO) Take 1 tablet by mouth daily at 6 (six) AM.    [provider]   polyethylene glycol (MIRALAX  / GLYCOLAX ) 17 g packet Take 17 g by mouth daily as needed (constipation). 12/12/19   Onetha Bile, NP  sacubitril -valsartan  (ENTRESTO ) 49-51 MG Take 1 tablet by mouth 2 (two) times daily. 09/30/22   Bensimhon, Rheta Celestine, MD  spironolactone  (ALDACTONE ) 25 MG tablet Take 1 tablet (25 mg total) by mouth daily. NEEDS FOLLOW UP FOR ANYMORE REFILLS 04/21/23   Bensimhon, Rheta Celestine, MD  torsemide  (DEMADEX ) 20 MG tablet Take 1 tablet (20 mg total) by mouth daily. Please call to schedule follow up for any additional refills [phone] thanks Patient not taking: Reported on 09/25/2023 06/22/22   Darlis Eisenmenger, MD  TRUEplus Lancets 28G MISC Use as directed in the morning and at bedtime. 10/15/21   Gregoria Leas, NP  Vitamin D -Vitamin K (K2 PLUS D3 PO) Take 1 tablet by mouth daily.    [provider]     Allergies    Hyzaar [losartan potassium-hctz]   Review of Systems   Review of Systems Please see HPI for pertinent positives and negatives  Physical Exam BP 116/72   Pulse 88   Temp 98 F (36.7 C) (Oral)   Resp 17   SpO2 95%   Physical Exam Vitals and nursing note reviewed.  Constitutional:      Appearance: Normal appearance.  HENT:  Head: Normocephalic and atraumatic.     Nose: Nose normal.     Mouth/Throat:     Mouth: Mucous membranes are moist.  Eyes:     Extraocular Movements: Extraocular movements intact.     Conjunctiva/sclera: Conjunctivae normal.  Cardiovascular:     Rate and Rhythm: Normal rate.  Pulmonary:     Effort: Pulmonary effort is normal.     Breath sounds: Normal breath sounds.  Chest:     Chest wall: Tenderness (mid-sternal) present.  Abdominal:     General: Abdomen is flat.     Palpations: Abdomen is soft.     Tenderness: There is abdominal tenderness (epigastric).  Musculoskeletal:        General: No swelling. Normal range of motion.     Cervical back: Neck supple.  Skin:    General: Skin is warm and dry.   Neurological:     General: No focal deficit present.     Mental Status: She is alert.  Psychiatric:        Mood and Affect: Mood normal.     ED Results / Procedures / Treatments   EKG EKG Interpretation Date/Time:  Sunday November 12 2023 03:54:10 EDT Ventricular Rate:  80 PR Interval:  183 QRS Duration:  96 QT Interval:  387 QTC Calculation: 447 R Axis:   63  Text Interpretation: Sinus rhythm No significant change since last tracing Confirmed by Shawnee Dellen [id number]) on 11/12/2023 4:12:56 AM  Procedures Procedures  Medications Ordered in the ED Medications - No data to display  Initial Impression and Plan  Patient here with atypical chest/epigastric pain, consider ACS, gastritis, biliary colic, pancreatitis, costochondritis, less likely PE or dissection. Will check labs, CXR. Patient declines pain medication at this time.   ED Course   Clinical Course as of 11/12/23 0655  Sun Nov 12, 2023  0412 CBC is normal.  [CS]  0440 BMP with mildly increased Cr from baseline. Trop is normal.  [CS]  [id number] I personally viewed the images from radiology studies and agree with radiologist interpretation: CXR without acute finding [CS]  0627 LFTs and Lipase are normal.  [CS]  4696 Patient sleeping soundly on re-evaluation. Reports some continued MSK chest pain with movement but otherwise reports she is feeling better. Delta trop is pending, but if remains normal, anticipate discharge home with PCP follow up, RTED for any other concerns. Patient is amenable to this plan.   [CS]    Clinical Course User Index [CS] Charmayne Cooper, MD     MDM Rules/Calculators/A&P Medical Decision Making Given presenting complaint, I considered that admission might be necessary. After review of results from ED lab and/or imaging studies, admission to the hospital is not indicated at this time.    Problems Addressed: Atypical chest pain: acute illness or injury  Amount and/or Complexity of Data  Reviewed Labs: ordered. Decision-making details documented in ED Course. Radiology: ordered and independent interpretation performed. Decision-making details documented in ED Course. ECG/medicine tests: ordered and independent interpretation performed. Decision-making details documented in ED Course.  Risk Decision regarding hospitalization.     Final Clinical Impression(s) / ED Diagnoses Final diagnoses:  Atypical chest pain    Rx / DC Orders ED Discharge Orders     None        Charmayne Cooper, MD 11/12/23 [phone]

## 2023-11-14 ENCOUNTER — Telehealth (HOSPITAL_COMMUNITY): Payer: Self-pay | Admitting: Licensed Clinical Social Worker

## 2023-11-14 ENCOUNTER — Telehealth: Payer: Self-pay

## 2023-11-14 ENCOUNTER — Other Ambulatory Visit: Payer: Self-pay

## 2023-11-14 DIAGNOSIS — E1165 Type 2 diabetes mellitus with hyperglycemia: Secondary | ICD-10-CM

## 2023-11-14 MED ORDER — TRULICITY 3 MG/0.5ML ~~LOC~~ SOAJ
3.0000 mg | SUBCUTANEOUS | 6 refills | Status: DC
  Filled 2023-11-14: qty 2, 28d supply, fill #0
  Filled 2023-12-15: qty 2, 28d supply, fill #1
  Filled 2024-02-08: qty 2, 28d supply, fill #2
  Filled 2024-03-11: qty 2, 28d supply, fill #3

## 2023-11-14 NOTE — Transitions of Care (Post Inpatient/ED Visit) (Signed)
 11/14/2023  Name: Maria Duncan MRN: [id number] DOB: 1965/11/05  Today's TOC FU Call Status:   Patient's Name and Date of Birth confirmed.  Transition Care Management Follow-up Telephone Call Date of Discharge: 11/12/23 Discharge Facility: [facility] ([facility]) Type of Discharge: Emergency Department Reason for ED Visit: Cardiac Conditions Cardiac Conditions Diagnosis: Chest Pain Persisting How have you been since you were released from the hospital?: Better Any questions or concerns?: No  Items Reviewed: Did you receive and understand the discharge instructions provided?: Yes Medications obtained,verified, and reconciled?: Yes (Medications Reviewed) Any new allergies since your discharge?: No Dietary orders reviewed?: No Do you have support at home?: Yes People in Home [RPT]: significant other Name of Support/Comfort Primary Source: Husband  Medications Reviewed Today: Medications Reviewed Today     Reviewed by Angelita Bares, CMA (Certified Medical Assistant) on 11/14/23 at [phone]  Med List Status: <None>   Medication Order Taking? Sig Documenting Provider Last Dose Status Informant  aspirin  81 MG EC tablet [id number] Yes Take 1 tablet (81 mg total) by mouth daily. Verona Goodwill, MD Taking Active Self  atorvastatin  (LIPITOR) 80 MG tablet [id number] Yes Take 1 tablet (80 mg total) by mouth daily. Thayne, Arlice Bene, FNP Taking Active   carvedilol  (COREG ) 6.25 MG tablet [id number] Yes Take 1 tablet (6.25 mg total) by mouth 2 (two) times daily. NEEDS FOLLOW UP APPOINTMENT FOR MORE REFILLS Jerrlyn Morel, NP Taking Active   diclofenac  (VOLTAREN ) 75 MG EC tablet [id number] Yes Take 1 tablet (75 mg total) by mouth 2 (two) times daily. Standiford, Karlene Overcast, DPM Taking Active   docusate sodium  (COLACE) 100 MG capsule [id number] Yes Take 1 capsule (100 mg total) by mouth 2 (two) times daily. Onetha Bile, NP Taking Active Self  Dulaglutide  (TRULICITY ) 1.5 MG/0.5ML [place]  [id number] Yes Inject 1.5 mg into the skin once a week. Jerrlyn Morel, NP Taking Active   gabapentin  (NEURONTIN ) 100 MG capsule [id number] Yes Take 1 capsule (100 mg total) by mouth 3 (three) times daily. Jerrlyn Morel, NP Taking Active            Med Note Philmore Bream   Mon Sep 25, 2023 10:04 AM) Taking differently - takes 1 capsule twice daily  glucose blood (TRUE METRIX BLOOD GLUCOSE TEST) test strip [id number] Yes Use as instructed Gregoria Leas, NP Taking Active   hydrOXYzine  (ATARAX ) 10 MG tablet [id number] Yes Take 1 tablet (10 mg total) by mouth 3 (three) times daily as needed. Jerrlyn Morel, NP Taking Active   Insulin  Pen Needle (TRUEPLUS 5-BEVEL PEN NEEDLES) 31G X 5 MM MISC [id number] Yes Use daily at 2 PM. Nichols, Tonya S, NP Taking Active   Multiple Vitamins-Minerals (ZINC  PO) [id number] Yes Take 1 tablet by mouth daily at 6 (six) AM. [provider] Taking Active   polyethylene glycol (MIRALAX  / GLYCOLAX ) 17 g packet [id number] Yes Take 17 g by mouth daily as needed (constipation). Onetha Bile, NP Taking Active            Med Note Julian Obey   Wed Sep 07, 2022  8:33 AM) Prn   sacubitril -valsartan  (ENTRESTO ) 49-51 MG [id number] Yes Take 1 tablet by mouth 2 (two) times daily. Bensimhon, Rheta Celestine, MD Taking Active   spironolactone  (ALDACTONE ) 25 MG tablet [id number] Yes Take 1 tablet (25 mg total) by mouth daily. NEEDS FOLLOW UP FOR ANYMORE REFILLS Bensimhon, Rheta Celestine, MD Taking Active   torsemide  (DEMADEX ) 20 MG tablet [id number]  No Take 1 tablet (20 mg total) by mouth daily. Please call to schedule follow up for any additional refills [phone] thanks  Patient not taking: Reported on 11/14/2023   Darlis Eisenmenger, MD Not Taking Active            Med Note April Knack, Marthe Slain Sep 14, 2022  9:42 AM) PRN  TRUEplus Lancets 28G MISC [id number] Yes Use as directed in the morning and at bedtime. Gregoria Leas, NP Taking Active   Vitamin D -Vitamin K (K2 PLUS  D3 PO) [id number] Yes Take 1 tablet by mouth daily. [provider] Taking Active             Home Care and Equipment/Supplies: Were Home Health Services Ordered?: No Any new equipment or medical supplies ordered?: No  Functional Questionnaire: Do you need assistance with bathing/showering or dressing?: No Do you need assistance with meal preparation?: No Do you need assistance with eating?: No Do you have difficulty maintaining continence: No Do you need assistance with getting out of bed/getting out of a chair/moving?: No Do you have difficulty managing or taking your medications?: No  Follow up appointments reviewed: PCP Follow-up appointment confirmed?: Yes Date of PCP follow-up appointment?: 11/23/23 Specialist Hospital Follow-up appointment confirmed?: No Do you need transportation to your follow-up appointment?: No Do you understand care options if your condition(s) worsen?: Yes-patient verbalized understanding  SDOH Interventions Today    Flowsheet Row Most Recent Value  SDOH Interventions   Food Insecurity Interventions Other (Comment)  [Patient stated that she is going to get assistance]  Housing Interventions Intervention Not Indicated  Transportation Interventions Intervention Not Indicated       SIGNATURE [place] Zou Macon, RMA

## 2023-11-14 NOTE — Progress Notes (Signed)
 11/14/2023 Name: Maria Duncan MRN: [id number] DOB: 25-Apr-1966  Chief Complaint  Patient presents with   Diabetes   Hypertension   Hyperlipidemia    Maria Duncan is a 58 y.o. year old female who presented for a telephone visit.   They were referred to the pharmacist by their PCP for assistance in managing diabetes. PMH includes CHF(last EF 55-60% in 2022, recovered from 35-40% in 2021), HTN, nonobstructive CAD, OSA, T2DM, obesity    Subjective: Patient reports that she is doing well. She continues to tolerate Trulicity  1.5 mg weekly (Sundays) well. She reports that this is the only medication she takes for DM. She states that she stopped taking insulin  when she was prescribed Trulicity  because she did not know she was supposed to take both injections. She stopped metformin  because it caused stomach cramps. She did have an ED visit on 11/12/23 for chest pain - states that it was precipitated by taking torsemide . Labwork was overall normal, though she did appear dehydrated on DMP (BUN 31, Cr 1.38, baseline 0.9). She is feeling better now, no recurrent chest pain. She confirms that she is still receiving Entresto  via PAP in the mail.   She asks for information on scheduling with Dr. Andria Keeler office (Advanced [facility]) for follow-up. She also expresses difficulty paying some of her bills, being out of work recently and requests to be reconnected with LCSW, Jenna, at Adv HF clinic who assisted her before.   Care Team: Primary Care Provider: Jerrlyn Morel, NP ; Next Scheduled Visit: 11/24/23 Cardiologist: Dr. Bensimhon; Next Scheduled Visit: Scheduled after telephone appt for 01/19/24  Medication Access/Adherence  Current Pharmacy:  [facility] - [facility] [address], [address] [place] [address] Phone: [phone] Fax: [phone]  [place] - [facility] [address] [place] [address] Phone: [phone]  Fax: [phone]  [facility] [place], [place] - [address] [place] [address] Phone: [phone] Fax: [phone]  [id number] [place], [place] - [address] AT [facility] [address] [place] [address] Phone: [phone] Fax: [phone]  [id number] [place], [place] - [address] AT [facility] [address] [facility] [address] [facility] [address] [place] [address] Phone: [phone] Fax: [phone]   Patient reports affordability concerns with their medications: No  Patient reports access/transportation concerns to their pharmacy: No  Patient reports adherence concerns with their medications:  No     Diabetes:  Current medications: Trulicity  1.5  mg weekly  Tolerating Trulicity  well. Reports occasional GI upset (not bothersome to her), and otherwise denies nausea and constipation.  Using glucometer; Checking a couple times per week. Denies BG > 300, highest ~250. FBG ~150 mg/L. Denies BG < 125 mg/dL Readings per glucometer:   Patient denies hypoglycemic s/sx including dizziness, shakiness, sweating. Patient reports hyperglycemic symptoms including nocturia (every hour). Occasional blurry vision. Reports she does urinate frequently during the day, but it is more noticeable at night. Denies neuropathy.   Current meal patterns: 3 meals/day - Breakfast: oatmeal, waffles, eggs, [place] bacon or [place] sausage, vegan meats - Lunch: [place] and cheese sandwich, fruit bowl, acai bowl, egg, granola, tuna and crackers, salads - Supper: baked [place], chicken, fish, green beans, collards, okra - Snacks: apple sauce, yogurt, carrots, cucumber with light ranch, grapes, berries - Drinks: water only  Current physical activity: used to  go for walks, but has had difficulty recently due to ankle pain, trying chair exercises  Current medication access support: Currently uninsured  receiving some meds via [facility] supply at [facility]. She reports that she plans to go to [facility] enrollment office soon to try to enroll in [place].    Hyperlipidemia/ASCVD Risk Reduction  Current lipid lowering medications: atorvastatin  80 mg daily  Antiplatelet regimen: aspirin  81 mg daily  ASCVD History: nonobstructive CAD on cath Risk Factors: T2DM, HLD, HTN   Heart Failure (EF 55-60%):  Current medications:  ACEi/ARB/ARNI: Entresto  49-51 mg BID SGLT2i: none (has periodic yeast infections) Beta blocker: carvedilol  6.25 mg BID Mineralocorticoid Receptor Antagonist: spironolactone  25 mg daily (not filled since Feb 2025 - last rx from Dr. Bensimhon stated she needed follow-up before getting additional refills, patient scheduled follow-up after telephone call today). Diuretic regimen: torsemide  20 mg daily - not taking anymore, states that it causes severe cramping  Current home blood pressure readings: does not check Current home weights: did not evaluate today  Objective:  BP Readings from Last 3 Encounters:  11/12/23 117/62  03/29/23 110/61  01/16/23 (!) 146/84    Lab Results  Component Value Date   HGBA1C 12.9 (A) 03/29/2023    Lab Results  Component Value Date   CREATININE 1.38 (H) 11/12/2023   BUN 31 (H) 11/12/2023   NA 137 11/12/2023   K 4.0 11/12/2023   CL 96 (L) 11/12/2023   CO2 32 11/12/2023    Lab Results  Component Value Date   CHOL 167 09/07/2022   HDL 62 09/07/2022   LDLCALC 92 09/07/2022   TRIG 65 09/07/2022   CHOLHDL 2.7 09/07/2022    Medications Reviewed Today     Reviewed by Adra Alanis, RPH (Pharmacist) on 11/14/23 at 1356  Med List Status: <None>   Medication Order Taking? Sig Documenting Provider Last Dose Status Informant  aspirin  81 MG EC tablet [id number]  Take 1 tablet (81 mg total) by mouth daily. Verona Goodwill, MD  Active Self  atorvastatin  (LIPITOR) 80 MG tablet [id number] Yes Take 1 tablet (80 mg total) by mouth daily.  Zemple, Arlice Bene, FNP Taking Active   carvedilol  (COREG ) 6.25 MG tablet [id number] Yes Take 1 tablet (6.25 mg total) by mouth 2 (two) times daily. NEEDS FOLLOW UP APPOINTMENT FOR MORE REFILLS Jerrlyn Morel, NP Taking Active   diclofenac  (VOLTAREN ) 75 MG EC tablet [id number]  Take 1 tablet (75 mg total) by mouth 2 (two) times daily. Standiford, Karlene Overcast, DPM  Active   docusate sodium  (COLACE) 100 MG capsule [id number]  Take 1 capsule (100 mg total) by mouth 2 (two) times daily. Onetha Bile, NP  Active Self  Dulaglutide  (TRULICITY ) 1.5 MG/0.5ML [place] [id number] Yes Inject 1.5 mg into the skin once a week. Jerrlyn Morel, NP Taking Active   gabapentin  (NEURONTIN ) 100 MG capsule [id number] Yes Take 1 capsule (100 mg total) by mouth 3 (three) times daily. Jerrlyn Morel, NP Taking Active            Med Note Philmore Bream   Mon Sep 25, 2023 10:04 AM) Taking differently - takes 1 capsule twice daily  glucose blood (TRUE METRIX BLOOD GLUCOSE TEST) test strip [id number]  Use as instructed Gregoria Leas, NP  Active   hydrOXYzine  (ATARAX ) 10 MG tablet [id number]  Take 1 tablet (10 mg total) by mouth 3 (three) times daily as needed. Jerrlyn Morel, NP  Active   Insulin  Pen Needle (  TRUEPLUS 5-BEVEL PEN NEEDLES) 31G X 5 MM MISC [id number]  Use daily at 2 PM. Nichols, Tonya S, NP  Active   Multiple Vitamins-Minerals (ZINC  PO) [id number]  Take 1 tablet by mouth daily at 6 (six) AM. [provider]  Active   polyethylene glycol (MIRALAX  / GLYCOLAX ) 17 g packet [id number]  Take 17 g by mouth daily as needed (constipation). Onetha Bile, NP  Active            Med Note Julian Obey   Wed Sep 07, 2022  8:33 AM) Prn   sacubitril -valsartan  (ENTRESTO ) 49-51 MG [id number] Yes Take 1 tablet by mouth 2 (two) times daily. Bensimhon, Rheta Celestine, MD Taking Active            Med Note Francetta Innocent, Flavio Huguenin   Tue Nov 14, 2023  1:56 PM) Via PAP  spironolactone  (ALDACTONE ) 25 MG tablet [id number]  Take 1 tablet (25  mg total) by mouth daily. NEEDS FOLLOW UP FOR ANYMORE REFILLS Bensimhon, Rheta Celestine, MD  Active   torsemide  (DEMADEX ) 20 MG tablet [id number] No Take 1 tablet (20 mg total) by mouth daily. Please call to schedule follow up for any additional refills [phone] thanks  Patient not taking: Reported on 09/25/2023   Darlis Eisenmenger, MD Not Taking Active            Med Note April Knack, Marthe Slain Sep 14, 2022  9:42 AM) PRN  TRUEplus Lancets 28G MISC [id number]  Use as directed in the morning and at bedtime. Gregoria Leas, NP  Active   Vitamin D -Vitamin K (K2 PLUS D3 PO) [id number]  Take 1 tablet by mouth daily. [provider]  Active              Assessment/Plan:   Diabetes: - Currently uncontrolled based on last A1c 12.9% on 03/29/23. Patient reported FBG are above goal 80-130 mg/dL. She is tolerating Trulicity  well. Will recommend titrating to next dose when she is due for a refill. Patient was initially hesitant to increase dose, so I am leaving the 1.5 mg strength on file at the pharmacy, but encouraged her to increase at next fill. Patient does have ongoing symptoms of high blood sugar (polyuria), but has not had an A1C checked since September 2024. Would prefer to recheck A1C prior to considering starting insulin  vs additional oral therapy.  - Reviewed long term cardiovascular and renal outcomes of uncontrolled blood sugar - Reviewed goal A1c, goal fasting, and goal 2 hour post prandial glucose - Reviewed dietary modifications including incorporating protein with each meal and limiting portion size of carbohydrates - Recommend to increase Trulicity  to 3 mg once weekly at next fill. Will collaborate with PCP to implement change. - Patient denies personal or family history of multiple endocrine neoplasia type 2, medullary thyroid  cancer; personal history of pancreatitis or gallbladder disease. - Recommend to check glucose daily - Re-counseled patient on Medicaid enrollment office  on 4th floor of [facility] and she stated she would go soon. Verified that she was familiar with location.  - A1c due at PCP visit 11/24/23    Hyperlipidemia/ASCVD Risk Reduction: - Currently uncontrolled with last LDL 92 mg/dl on 5/64/33. LDL-C goal < 55 mg/dL given premature ASCVD (non-obstructive CAD on cath) Patient is due for updated lipid panel. Will re-evaluate control at next visit. - Reviewed long term complications of uncontrolled cholesterol - Recommend to continue atorvastatin  80 mg daily  - Lipid panel due at PCP  visit next Monday, 3/10    Heart Failure: - Currently appropriately managed, though patient is likely out of spironolactone  per dispense report. Patient was provided Adv HF clinic # and successfully scheduled follow-up. She has not been started on an SGLT2i due to periodic yeast infections - may reconsider once she demonstrates improved diabetes control.  - Reviewed appropriate blood pressure monitoring technique and reviewed goal blood pressure - Reviewed to weigh daily and when to contact cardiology with weight gain - Recommend to continue current regimen.  - Connected with LCSW Jenna Uris given patient reported financial constraints    Follow Up Plan: PCP 11/24/23, Pharmacist telephone 12/17/23  Arthea Larsson, PharmD PGY1 Pharmacy Resident

## 2023-11-14 NOTE — Telephone Encounter (Signed)
 H&V Care Navigation CSW Progress Note  Clinical Social Worker  received call from patient  to assist with financial needs.  Patient is participating in a Managed Medicaid Plan:  No  Patient contacted CSW to request assistance as she recently lost her job in [employer] and is uninsured. She states that she has been suing her credit cards to cover her bills and is in need of some relief. She hopes to return to work in the coming month but unable to afford her current utility bills. Patient has no income as well as is uninsured. CSW assisted with financial support from the Patient Care Fund to bridge the gap of financial needs. Patient grateful for the support and will follow up as needed. Aubry Blase, LCSW, CCSW-MCS [phone]   SDOH Screenings   Food Insecurity: Food Insecurity Present (11/14/2023)  Housing: Unknown (11/14/2023)  Transportation Needs: No Transportation Needs (11/14/2023)  Utilities: At Risk (11/14/2023)  Depression (PHQ2-9): Low Risk  (03/29/2023)  Financial Resource Strain: High Risk (11/14/2023)  Tobacco Use: Low Risk  (11/12/2023)

## 2023-11-15 ENCOUNTER — Other Ambulatory Visit: Payer: Self-pay

## 2023-11-16 ENCOUNTER — Other Ambulatory Visit (HOSPITAL_COMMUNITY): Payer: Self-pay | Admitting: Nurse Practitioner

## 2023-11-16 ENCOUNTER — Other Ambulatory Visit: Payer: Self-pay

## 2023-11-16 ENCOUNTER — Other Ambulatory Visit (HOSPITAL_COMMUNITY): Payer: Self-pay | Admitting: Family Medicine

## 2023-11-16 MED ORDER — CARVEDILOL 6.25 MG PO TABS
6.2500 mg | ORAL_TABLET | Freq: Two times a day (BID) | ORAL | 0 refills | Status: DC
  Filled 2023-11-16 – 2023-12-15 (×2): qty 60, 30d supply, fill #0

## 2023-11-17 ENCOUNTER — Other Ambulatory Visit: Payer: Self-pay

## 2023-11-17 MED ORDER — ATORVASTATIN CALCIUM 80 MG PO TABS
80.0000 mg | ORAL_TABLET | Freq: Every day | ORAL | 3 refills | Status: DC
  Filled 2023-11-17: qty 90, 90d supply, fill #0

## 2023-11-21 ENCOUNTER — Other Ambulatory Visit: Payer: Self-pay

## 2023-11-21 MED ORDER — NOREL AD 4-10-325 MG PO TABS
1.0000 | ORAL_TABLET | ORAL | 0 refills | Status: DC
  Filled 2023-11-21 – 2024-03-11 (×2): qty 20, 4d supply, fill #0

## 2023-11-21 MED ORDER — PSEUDOEPH-BROMPHEN-DM 30-2-10 MG/5ML PO SYRP
10.0000 mL | ORAL_SOLUTION | ORAL | 0 refills | Status: DC
  Filled 2023-11-21: qty 336, 30d supply, fill #0

## 2023-11-21 MED ORDER — AMOXICILLIN-POT CLAVULANATE 875-125 MG PO TABS
1.0000 | ORAL_TABLET | Freq: Two times a day (BID) | ORAL | 0 refills | Status: DC
  Filled 2023-11-21: qty 14, 7d supply, fill #0

## 2023-11-21 MED ORDER — AZELASTINE HCL 0.15 % NA SOLN
1.0000 | Freq: Two times a day (BID) | NASAL | 0 refills | Status: DC
Start: 2023-11-20 — End: 2024-01-19
  Filled 2023-11-21: qty 30, 30d supply, fill #0

## 2023-11-21 MED ORDER — PREDNISONE 20 MG PO TABS
20.0000 mg | ORAL_TABLET | Freq: Two times a day (BID) | ORAL | 0 refills | Status: DC
Start: 2023-11-20 — End: 2023-11-24
  Filled 2023-11-21: qty 10, 5d supply, fill #0

## 2023-11-23 ENCOUNTER — Inpatient Hospital Stay: Payer: Self-pay | Admitting: Nurse Practitioner

## 2023-11-24 ENCOUNTER — Other Ambulatory Visit (HOSPITAL_COMMUNITY): Payer: Self-pay

## 2023-11-24 ENCOUNTER — Telehealth: Payer: Self-pay

## 2023-11-24 ENCOUNTER — Other Ambulatory Visit: Payer: Self-pay | Admitting: Nurse Practitioner

## 2023-11-24 ENCOUNTER — Other Ambulatory Visit: Payer: Self-pay

## 2023-11-24 ENCOUNTER — Encounter: Payer: Self-pay | Admitting: Nurse Practitioner

## 2023-11-24 ENCOUNTER — Ambulatory Visit (INDEPENDENT_AMBULATORY_CARE_PROVIDER_SITE_OTHER): Admitting: Nurse Practitioner

## 2023-11-24 VITALS — BP 122/82 | Temp 98.2°F | Ht 65.0 in | Wt 225.0 lb

## 2023-11-24 DIAGNOSIS — E1165 Type 2 diabetes mellitus with hyperglycemia: Secondary | ICD-10-CM | POA: Diagnosis not present

## 2023-11-24 DIAGNOSIS — G4730 Sleep apnea, unspecified: Secondary | ICD-10-CM | POA: Diagnosis not present

## 2023-11-24 DIAGNOSIS — M79671 Pain in right foot: Secondary | ICD-10-CM

## 2023-11-24 DIAGNOSIS — Z1231 Encounter for screening mammogram for malignant neoplasm of breast: Secondary | ICD-10-CM

## 2023-11-24 DIAGNOSIS — J069 Acute upper respiratory infection, unspecified: Secondary | ICD-10-CM

## 2023-11-24 DIAGNOSIS — Z13220 Encounter for screening for lipoid disorders: Secondary | ICD-10-CM

## 2023-11-24 DIAGNOSIS — Z139 Encounter for screening, unspecified: Secondary | ICD-10-CM

## 2023-11-24 DIAGNOSIS — M79672 Pain in left foot: Secondary | ICD-10-CM

## 2023-11-24 DIAGNOSIS — Z124 Encounter for screening for malignant neoplasm of cervix: Secondary | ICD-10-CM

## 2023-11-24 LAB — POCT GLYCOSYLATED HEMOGLOBIN (HGB A1C): Hemoglobin A1C: 8 % — AB (ref 4.0–5.6)

## 2023-11-24 MED ORDER — AMOXICILLIN 875 MG PO TABS
875.0000 mg | ORAL_TABLET | Freq: Two times a day (BID) | ORAL | 0 refills | Status: AC
  Filled 2023-11-24 (×2): qty 20, 10d supply, fill #0

## 2023-11-24 MED ORDER — AMOXICILLIN 875 MG PO TABS
875.0000 mg | ORAL_TABLET | Freq: Two times a day (BID) | ORAL | 0 refills | Status: DC
  Filled 2023-11-24: qty 20, 10d supply, fill #0

## 2023-11-24 NOTE — Patient Instructions (Signed)
 1. Type 2 diabetes mellitus with hyperglycemia, unspecified whether long term insulin  use (HCC) (Primary)  - HgB A1c  2. Sleep apnea, unspecified type  - Ambulatory referral to Pulmonology  3. Upper respiratory tract infection, unspecified type  - amoxicillin  (AMOXIL ) 875 MG tablet; Take 1 tablet (875 mg total) by mouth 2 (two) times daily for 10 days.  Dispense: 20 tablet; Refill: 0  4. Lipid screening  - Lipid Panel  5. Bilateral foot pain  - Ambulatory referral to Podiatry  6. Encounter for screening involving social determinants of health (SDoH)  - AMB Referral VBCI Care Management  7. Encounter for screening mammogram for malignant neoplasm of breast  - MM 3D SCREENING MAMMOGRAM BILATERAL BREAST  8. Cervical cancer screening  - Ambulatory referral to Obstetrics / Gynecology

## 2023-11-24 NOTE — Telephone Encounter (Signed)
 Copied from CRM [id number]. Topic: Clinical - Prescription Issue >> Nov 24, 2023  1:58 PM Donald Frost wrote: Reason for CRM: The patient called and states her prescription for her amoxicillin  (AMOXIL ) 875 MG tablet was accidentally thrown away. She is hoping a new prescription can be sent to her pharmacy. Please assist patient further by sending new script to  [facility]  Phone: [phone] Fax: [phone]   And please let patient know when this is done as she needs to start this ASAP

## 2023-11-24 NOTE — Telephone Encounter (Unsigned)
 Copied from CRM [id number]. Topic: Clinical - Prescription Issue >> Nov 24, 2023  1:56 PM Essie A wrote: Reason for CRM: [facility] called because patient lost her prescription for amoxicillin  (AMOXIL ) 875 MG tablet.  They need a new prescription sent and they will get an overide from her insurance.

## 2023-11-24 NOTE — Progress Notes (Signed)
 Subjective   Patient ID: Maria Duncan, female    DOB: [birth year], 58 y.o.   MRN: [id number]  Chief Complaint  Patient presents with   Hospitalization Follow-up    Coughing  congestion  she is taking otc  medication , she coughing /yellowish mucus , no fever or sore throat     Referring provider: Jerrlyn Morel, NP  Maria Duncan is a 58 y.o. 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)  (HCC) No date: Cataract     Comment:  not a surgical candidate at this time (02/03/2023) 12/12/2019: Chest pain of uncertain etiology, non obstructive CAD,  pain due to acute HF No date: CHF (congestive heart failure) (HCC) No date: Diabetes mellitus without complication (HCC) No date: Diabetic peripheral neuropathy (HCC)     Comment:  bilateral feet No date: Heart murmur     Comment:  childhood No date: Hyperlipidemia     Comment:  on meds No date: Hypertension     Comment:  on meds 12/12/2019: Hypoventilation associated with obesity (HCC) No date: Morbid obesity (HCC) No date: NICM (nonischemic cardiomyopathy) (HCC) 12/12/2019: OSA (obstructive sleep apnea)     Comment:  uses CPAP 12/12/2019: Pulmonary hypertension, unspecified (HCC)  HPI  Patient presents today for follow-up visit.  She states that she does need a referral for social worker for financial needs.  Overall she has been doing well since last visit here.  A1c is improved since last visit but still elevated 8.0 today.  Patient has recently started on Trulicity .  Handouts were given on diet and exercise.  Patient is requesting a referral to podiatry for bilateral foot pain and optometry.  Patient does need referral back to pulmonary for dedicated sleep study.  She states that the last time she was in the hospital her oxygen level was dropping while sleeping. Denies f/c/s, n/v/d, hemoptysis, PND, leg swelling Denies chest pain or edema      Allergies  Allergen  Reactions   Hyzaar [Losartan Potassium-Hctz] Nausea And Vomiting and Cough    Patient can take Entresto  now    Immunization History  Administered Date(s) Administered   Tdap 09/17/2020    Tobacco History: Social History   Tobacco Use  Smoking Status Never  Smokeless Tobacco Never   Counseling given: Not Answered   Outpatient Encounter Medications as of 11/24/2023  Medication Sig   amoxicillin  (AMOXIL ) 875 MG tablet Take 1 tablet (875 mg total) by mouth 2 (two) times daily for 10 days.   aspirin  81 MG EC tablet Take 1 tablet (81 mg total) by mouth daily.   atorvastatin  (LIPITOR) 80 MG tablet Take 1 tablet (80 mg total) by mouth daily.   Azelastine  HCl 0.15 % SOLN Place 1 spray into both nostrils 2 (two) times daily.   carvedilol  (COREG ) 6.25 MG tablet Take 1 tablet (6.25 mg total) by mouth 2 (two) times daily. NEEDS FOLLOW UP APPOINTMENT FOR MORE REFILLS   diclofenac  (VOLTAREN ) 75 MG EC tablet Take 1 tablet (75 mg total) by mouth 2 (two) times daily.   Dulaglutide  (TRULICITY ) 3 MG/0.5ML SOAJ Inject 3 mg into the skin once a week.   gabapentin  (NEURONTIN ) 100 MG capsule Take 1 capsule (100 mg total) by mouth 3 (three) times daily.   glucose blood (TRUE METRIX BLOOD GLUCOSE TEST) test strip Use as instructed   Insulin  Pen Needle (TRUEPLUS 5-BEVEL PEN NEEDLES) 31G X 5 MM MISC Use daily at 2 PM.  Multiple Vitamins-Minerals (ZINC  PO) Take 1 tablet by mouth daily at 6 (six) AM.   sacubitril -valsartan  (ENTRESTO ) 49-51 MG Take 1 tablet by mouth 2 (two) times daily.   TRUEplus Lancets 28G MISC Use as directed in the morning and at bedtime.   Vitamin D -Vitamin K (K2 PLUS D3 PO) Take 1 tablet by mouth daily.   Chlorphen-PE-Acetaminophen  (NOREL AD) 4-10-325 MG TABS Take 1 tablet by mouth every 4 - 6 hours as needed for symptoms. NO MORE THAN 6 TABLETS WITHIN 24 HOURS.   docusate sodium  (COLACE) 100 MG capsule Take 1 capsule (100 mg total) by mouth 2 (two) times daily. (Patient not taking:  Reported on 11/24/2023)   Dulaglutide  (TRULICITY ) 1.5 MG/0.5ML SOAJ Inject 1.5 mg into the skin once a week.   hydrOXYzine  (ATARAX ) 10 MG tablet Take 1 tablet (10 mg total) by mouth 3 (three) times daily as needed.   polyethylene glycol (MIRALAX  / GLYCOLAX ) 17 g packet Take 17 g by mouth daily as needed (constipation). (Patient not taking: Reported on 11/24/2023)   spironolactone  (ALDACTONE ) 25 MG tablet Take 1 tablet (25 mg total) by mouth daily. NEEDS FOLLOW UP FOR ANYMORE REFILLS (Patient not taking: Reported on 11/24/2023)   torsemide  (DEMADEX ) 20 MG tablet Take 1 tablet (20 mg total) by mouth daily. Please call to schedule follow up for any additional refills [phone] thanks (Patient not taking: Reported on 11/24/2023)   [DISCONTINUED] amoxicillin -clavulanate (AUGMENTIN ) 875-125 MG tablet Take 1 tablet by mouth every 12 (twelve) hours for 7 days for sinusitis.   [DISCONTINUED] brompheniramine-pseudoephedrine -DM 30-2-10 MG/5ML syrup Take 10 mLs by mouth every 4 - 6 hours for sinus symptoms.   [DISCONTINUED] predniSONE  (DELTASONE ) 20 MG tablet Take 1 tablet (20 mg total) by mouth 2 (two) times daily with a meal.   No facility-administered encounter medications on file as of 11/24/2023.    Review of Systems  Review of Systems  Constitutional: Negative.   HENT: Negative.    Cardiovascular: Negative.   Gastrointestinal: Negative.   Allergic/Immunologic: Negative.   Neurological: Negative.   Psychiatric/Behavioral: Negative.       Objective:   BP 122/82 (BP Location: Left Arm, Patient Position: Sitting, Cuff Size: Large)   Temp 98.2 F (36.8 C) (Oral)   Ht 5\' 5"  (1.651 m)   Wt 225 lb (102.1 kg)   SpO2 96%   BMI 37.44 kg/m   Wt Readings from Last 5 Encounters:  11/24/23 225 lb (102.1 kg)  03/29/23 228 lb (103.4 kg)  02/03/23 236 lb (107 kg)  01/16/23 234 lb 14.4 oz (106.5 kg)  12/14/22 232 lb 9.6 oz (105.5 kg)     Physical Exam Vitals and nursing note reviewed.  Constitutional:       General: She is not in acute distress.    Appearance: She is well-developed.  Cardiovascular:     Rate and Rhythm: Normal rate and regular rhythm.  Pulmonary:     Effort: Pulmonary effort is normal.     Breath sounds: Normal breath sounds.  Neurological:     Mental Status: She is alert and oriented to person, place, and time.       Assessment & Plan:   Type 2 diabetes mellitus with hyperglycemia, unspecified whether long term insulin  use (HCC) -     POCT glycosylated hemoglobin (Hb A1C)  Sleep apnea, unspecified type -     Pulmonary Visit  Upper respiratory tract infection, unspecified type -     Amoxicillin ; Take 1 tablet (875 mg total) by mouth  2 (two) times daily for 10 days.  Dispense: 20 tablet; Refill: 0  Lipid screening -     Lipid panel  Bilateral foot pain -     Ambulatory referral to Podiatry  Encounter for screening involving social determinants of health (SDoH) -     AMB Referral VBCI Care Management     Return in about 3 months (around 02/24/2024).   Jerrlyn Morel, NP 11/24/2023

## 2023-11-25 LAB — LIPID PANEL
Chol/HDL Ratio: 2.8 ratio (ref 0.0–4.4)
Cholesterol, Total: 154 mg/dL (ref 100–199)
HDL: 56 mg/dL (ref >39)
LDL Chol Calc (NIH): 80 mg/dL (ref 0–99)
Triglycerides: 100 mg/dL (ref 0–149)
VLDL Cholesterol Cal: 18 mg/dL (ref 5–40)

## 2023-11-27 ENCOUNTER — Other Ambulatory Visit: Payer: Self-pay

## 2023-11-27 ENCOUNTER — Other Ambulatory Visit (HOSPITAL_COMMUNITY): Payer: Self-pay

## 2023-11-27 MED ORDER — AMOXICILLIN 875 MG PO TABS
875.0000 mg | ORAL_TABLET | Freq: Two times a day (BID) | ORAL | 0 refills | Status: AC
Start: 2023-11-27 — End: 2023-12-07
  Filled 2023-11-27 (×2): qty 20, 10d supply, fill #0

## 2023-11-27 NOTE — Telephone Encounter (Signed)
 Please advise [facility]

## 2023-11-29 ENCOUNTER — Other Ambulatory Visit (HOSPITAL_COMMUNITY): Payer: Self-pay

## 2023-11-29 ENCOUNTER — Other Ambulatory Visit: Payer: Self-pay

## 2023-11-29 MED ORDER — LATANOPROST 0.005 % OP SOLN
1.0000 [drp] | Freq: Every day | OPHTHALMIC | 3 refills | Status: AC
Start: 2023-11-29 — End: ?
  Filled 2023-11-29 – 2023-12-01 (×2): qty 7.5, 75d supply, fill #0
  Filled 2024-03-11: qty 7.5, 75d supply, fill #1

## 2023-12-01 ENCOUNTER — Other Ambulatory Visit (HOSPITAL_COMMUNITY): Payer: Self-pay

## 2023-12-01 ENCOUNTER — Other Ambulatory Visit: Payer: Self-pay

## 2023-12-05 ENCOUNTER — Telehealth: Payer: Self-pay | Admitting: *Deleted

## 2023-12-05 NOTE — Progress Notes (Signed)
 Complex Care Management Note  Care Guide Note 12/05/2023 Name: SHELIA CRUMBAKER MRN: [id number] DOB: [birth year]  Isac Maples Krienke is a 58 y.o. year old female who sees Jerrlyn Morel, NP for primary care. I reached out to Isac Maples Sciandra by phone today to offer complex care management services.  Ms. Shorb was given information about Complex Care Management services today including:   The Complex Care Management services include support from the care team which includes your Nurse Care Manager, Clinical Social Worker, or Pharmacist.  The Complex Care Management team is here to help remove barriers to the health concerns and goals most important to you. Complex Care Management services are voluntary, and the patient may decline or stop services at any time by request to their care team member.   Complex Care Management Consent Status: Patient agreed to services and verbal consent obtained.   Follow up plan:  Telephone appointment with complex care management team member scheduled for:  12/06/23  Encounter Outcome:  Patient Scheduled

## 2023-12-06 ENCOUNTER — Other Ambulatory Visit: Payer: Self-pay

## 2023-12-06 NOTE — Patient Outreach (Signed)
 Complex Care Management   Visit Note  12/06/2023  Name:  Maria Duncan MRN: [id number] DOB: June 01, 1966  Situation: Referral received for Complex Care Management related to SDOH Barriers:  Housing , rent. I obtained verbal consent from Patient.  Visit completed with patient  on the phone  Background:   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) (HCC)    Cataract    not a surgical candidate at this time (02/03/2023)   Chest pain of uncertain etiology, non obstructive CAD, pain due to acute HF 12/12/2019   CHF (congestive heart failure) (HCC)    Diabetes mellitus without complication (HCC)    Diabetic peripheral neuropathy (HCC)    bilateral feet   Heart murmur    childhood   Hyperlipidemia    on meds   Hypertension    on meds   Hypoventilation associated with obesity (HCC) 12/12/2019   Morbid obesity (HCC)    NICM (nonischemic cardiomyopathy) (HCC)    OSA (obstructive sleep apnea) 12/12/2019   uses CPAP   Pulmonary hypertension, unspecified (HCC) 12/12/2019    Assessment: BSW outreached the patient to assist with utilities. A list of resources was sent to her via email.  SDOH Interventions    Flowsheet Row Patient Outreach Telephone from 12/06/2023 in [facility] Most recent reading at 12/06/2023  1:36 PM Telephone from 11/14/2023 in [facility] Most recent reading at 11/14/2023  2:17 PM Telephone from 11/14/2023 in [facility] Most recent reading at 11/14/2023  9:23 AM  SDOH Interventions     Food Insecurity Interventions Patient Declined -- Other (Comment)  [Patient stated that she is going to get assistance]  Housing Interventions -- -- Intervention Not Indicated  Transportation Interventions -- -- Intervention Not Indicated  Utilities Interventions Community Resources Provided Other  (Comment)  [H&V Patient Care Fund] --  Financial Strain Interventions Community Resources Provided Other (Comment)  [H&V Patient Care Fund] --         Recommendation:   No recommendations at this time.  Follow Up Plan:   Telephone follow-up on June 4th 2025  Haven Lion, Florestine Hurl [facility]  [facility] Social Worker, [employer] [phone]

## 2023-12-06 NOTE — Patient Instructions (Signed)
 Visit Information  Thank you for taking time to visit with me today. Please don't hesitate to contact me if I can be of assistance to you before our next scheduled appointment.  Our next appointment is by telephone on 12/27/2023 at 10am Please call the care guide team at [phone] if you need to cancel or reschedule your appointment.      Please call the Suicide and Crisis Lifeline: 988 call the [place]  National Suicide Prevention Lifeline: (432)011-3412 or TTY: 936-151-4961 TTY 864-370-9980) to talk to a trained counselor call 1-800-273-TALK (toll free, 24 hour hotline) go to [facility] [address] [place] [phone]) call 911 if you are experiencing a Mental Health or Behavioral Health Crisis or need someone to talk to.  Patient verbalizes understanding of instructions and care plan provided today and agrees to view in MyChart. Active MyChart status and patient understanding of how to access instructions and care plan via MyChart confirmed with patient.     Haven Lion, BSW [place]  [facility] Social Worker, [employer] [facility] [phone]

## 2023-12-12 ENCOUNTER — Encounter: Payer: Self-pay | Admitting: Podiatry

## 2023-12-12 ENCOUNTER — Ambulatory Visit: Admitting: Sleep Medicine

## 2023-12-12 ENCOUNTER — Ambulatory Visit (INDEPENDENT_AMBULATORY_CARE_PROVIDER_SITE_OTHER)

## 2023-12-12 ENCOUNTER — Ambulatory Visit (INDEPENDENT_AMBULATORY_CARE_PROVIDER_SITE_OTHER): Admitting: Podiatry

## 2023-12-12 VITALS — Ht 65.0 in | Wt 225.0 lb

## 2023-12-12 DIAGNOSIS — M76822 Posterior tibial tendinitis, left leg: Secondary | ICD-10-CM | POA: Diagnosis not present

## 2023-12-12 DIAGNOSIS — M79672 Pain in left foot: Secondary | ICD-10-CM | POA: Diagnosis not present

## 2023-12-12 DIAGNOSIS — E1149 Type 2 diabetes mellitus with other diabetic neurological complication: Secondary | ICD-10-CM | POA: Diagnosis not present

## 2023-12-12 DIAGNOSIS — M76821 Posterior tibial tendinitis, right leg: Secondary | ICD-10-CM | POA: Diagnosis not present

## 2023-12-12 DIAGNOSIS — Q666 Other congenital valgus deformities of feet: Secondary | ICD-10-CM

## 2023-12-12 DIAGNOSIS — M7661 Achilles tendinitis, right leg: Secondary | ICD-10-CM

## 2023-12-12 DIAGNOSIS — B351 Tinea unguium: Secondary | ICD-10-CM

## 2023-12-12 NOTE — Telephone Encounter (Signed)
 I think pt needs to be R/S

## 2023-12-13 NOTE — Progress Notes (Addendum)
 Subjective:  Patient ID: Maria Duncan, female    DOB: [birth year],  MRN: [id number]  Chief Complaint  Patient presents with   Foot Pain    Pt is here for bilateral foot pain that has been going on for quite a while pt states she was here last year and received and injection that did not help, state the pain sometimes is unbearable where she can not walk.    Discussed the use of AI scribe software for clinical note transcription with the patient, who gave verbal consent to proceed.  History of Present Illness Maria Duncan is a 58 year old female with diabetes and neuropathy who presents with bilateral foot pain which has been ongoing for over a year.  She was last seen Nov 23, 2022  for the same issue with Dr. Celia Coles.   She has experienced bilateral foot pain for three years, with the right foot more affected. The pain is throbbing and burning, significantly impacting her mobility and daily activities. She is unable to stand or put pressure on her feet. Previous injections for pain relief were ineffective.  Her diabetes and neuropathy are managed with gabapentin  100 mg three times a day, providing minimal relief. She is on Trulicity  for diabetes management, with her A1c improving from 12.9 to 8.  She has flat feet and describes a knot on her right foot. Her toenails are affected, possibly due to a fungal infection. No recent injuries have been reported.      Objective:    Physical Exam General: AAO x3, NAD  Dermatological: Nails are hypertrophic and a dystrophic nail discoloration but appear to be normal color in all about the same.  There is some hyperpigmentation to multiple toenails with no extension of surrounding skin.  No open lesions.  Vascular: Dorsalis Pedis artery and Posterior Tibial artery pedal pulses are 2/4 bilateral with immedate capillary fill time.  There is no pain with calf compression, swelling, warmth, erythema.   Neruologic: Sensation decreased with Semmes  Weinstein monofilament.  Negative Tinel's sign.  Musculoskeletal: There is a decreased medial arch upon weightbearing bilaterally.  Majority tenderness is localized along the course of the posterior tibial, flexor tendons with the right side worse than left.  Not able to appreciate any area pinpoint tenderness.  Ankle, subtalar joint range of motion intact.        Results LABS HbA1c: 8% Creatinine: elevated  RADIOLOGY Bilateral x-ray: pes planus noted without any evidence of acute fracture.   Assessment:   1. Posterior tibial tendinitis, right   2. Pes planovalgus   3. Posterior tibial tendon dysfunction (PTTD) of left lower extremity   4. Type II diabetes mellitus with neurological manifestations (HCC)   5. Chronic pain due to trauma      Plan:  Patient was evaluated and treated and all questions answered.  Assessment and Plan Assessment & Plan Diabetic neuropathy Chronic neuropathy with bilateral foot pain, more severe on the right. Gabapentin  provides partial relief. Limited medication options due to kidney concerns. Blood sugar control improving. - Continue gabapentin  100 mg TID. - Encourage blood sugar control.  Foot pain Chronic bilateral foot pain, more severe on the right, likely multifactorial.  I do think she is having posterior tibial tendon dysfunction and given the longevity of her symptoms concern for possible partial tearing. - Manage with boot and brace as per flat foot plan. - Order MRI to assess for soft tissue damage.  This was ordered.  Flat foot (pes planus)  Severe flat foot contributing to foot pain and tendon inflammation, particularly on the right. Surgery deferred due to elevated blood sugar and pending cardiac evaluation. - Order MRI of both ankles given longevity of symptoms. - Provide a boot for the right foot to offload pressure. - Provide a brace for the left foot. - Advise ice and heat therapy alternation. - Avoid anti-inflammatories and  oral steroids.  Onychomycosis Fungal infection of toenails, exacerbated by diabetes. Oral antifungals not recommended due to interactions and liver concerns. - Prescribe ciclopirox topical solution for nail application.    Return in about 4 weeks (around 01/09/2024) for foot pain, MRI results .   Charity Conch DPM

## 2023-12-14 ENCOUNTER — Other Ambulatory Visit: Payer: Self-pay | Admitting: Podiatry

## 2023-12-14 ENCOUNTER — Ambulatory Visit (INDEPENDENT_AMBULATORY_CARE_PROVIDER_SITE_OTHER): Admitting: Nurse Practitioner

## 2023-12-14 ENCOUNTER — Telehealth: Payer: Self-pay | Admitting: Podiatry

## 2023-12-14 ENCOUNTER — Other Ambulatory Visit: Payer: Self-pay

## 2023-12-14 ENCOUNTER — Encounter: Payer: Self-pay | Admitting: Nurse Practitioner

## 2023-12-14 VITALS — BP 126/90 | HR 79 | Ht 64.0 in | Wt 230.4 lb

## 2023-12-14 DIAGNOSIS — R079 Chest pain, unspecified: Secondary | ICD-10-CM | POA: Insufficient documentation

## 2023-12-14 DIAGNOSIS — G4733 Obstructive sleep apnea (adult) (pediatric): Secondary | ICD-10-CM

## 2023-12-14 MED ORDER — CICLOPIROX 8 % EX SOLN
Freq: Every day | CUTANEOUS | 2 refills | Status: AC
  Filled 2023-12-14: qty 6.6, 30d supply, fill #0
  Filled 2024-03-11 – 2024-04-30 (×2): qty 6.6, 30d supply, fill #1

## 2023-12-14 NOTE — Patient Instructions (Signed)
 You have a history of very severe seep apnea with significantly oxygen drops at night. You need to get back started on CPAP therapy. Since you have been off of therapy and need a new machine, you will need a repeat sleep study to reassess. I have ordered this urgently. Someone will contact you to schedule this.   We discussed how untreated sleep apnea puts an individual at risk for cardiac arrhthymias, pulm HTN, DM, stroke and increases their risk for daytime accidents. We also briefly reviewed treatment options including weight loss, side sleeping position, oral appliance, CPAP therapy or referral to ENT for possible surgical options  Use caution when driving and pull over if you become sleepy.  Call Dr. Andria Keeler office to see if you can get a sooner appt. We will also reach out to their schedulers  Follow up in 6 weeks with Maria Shandy Vi,NP to go over sleep study results, or sooner, if needed. Friday PM virtual clinic preferred

## 2023-12-14 NOTE — Progress Notes (Signed)
 @Patient  ID: Maria Duncan, female    DOB: Dec 20, 1965, 58 y.o.   MRN: [id number]  Chief Complaint  Patient presents with   Follow-up    Patient needs a new CPAP machine.    Referring provider: Jerrlyn Morel, NP  HPI: 58 year old female, never smoker followed for OSA on CPAP. She was last seen in off by Parrett, NP 09/21/2022. Past medical history significant for CHF, CAD, HTN, NICM, PH, DM, obesity.   TEST/EVENTS:  11/2019 echo: EF 35-40%, GIDD, moderately elevated PASP.  11/2019 RHC/LHC: minimal nonobstructive CAD, severe nonischemic cardiomyopathy with EF 25% 12/2019 NPSG: AHI 99/h, SpO2 low 51% 04/2020 echo: EF 50-55%, GIDD, RV size mildly enlarged  09/21/2022: OV with Parrett, NP for sleep consult. Diagnosed with severe OSA 12/2019. AHI 99/h, SpO2 low 51%. Optimal control on CPAP 17 cmH2O. Trying to use CPAP off and on since then. Feels pressure is way too high. Likes the [facility] she has but can only wear it for an hour or 2. Minimal use on download; residual AHI 0.5/h. severe COVID illness 06/2020. D/c home on O2 and has been on it at bedtime since. Takes 3-4 naps a day. Epworth 15/24  12/14/2023: Today - follow up Discussed the use of AI scribe software for clinical note transcription with the patient, who gave verbal consent to proceed.  History of Present Illness   Maria Duncan is a 58 year old female with severe sleep apnea who presents with issues related to CPAP.  She has severe sleep apnea and has not been using her CPAP machine consistently. Initially, she was not using it because she just felt like she couldn't get use to the mask, and more recently, her granddaughter accidentally damaged the machine by spilling water on it so it is no longer working. During a recent hospital visit, her oxygen levels dropped significantly while asleep. She experiences headaches, which she attributes to low oxygen levels during sleep. She feels tired all the time. She does snore.   She has  been experiencing intermittent, sharp chest pains for about a month, which occur during the day and are exacerbated by physical activity. She is not having any active chest pain and the last occurrence was a few days ago. The pain does not radiate. She denies orthopnea, palpitations, syncope, lightheadedness, dizziness. She has not notified her heart doctor of this. She did have an EKG 3-4 weeks ago and a troponin due to the pain, which were unremarkable.   She denies any issues with drowsy driving as she is not driving most of the time. She does not consume alcohol or smoke. She reports waking up frequently at night, approximately every hour, to urinate, and estimates getting about four to five hours of sleep per night. She does not take any sleep medications. Not using oxygen at night.   No recent reflux symptoms.       Allergies  Allergen Reactions   Hyzaar [Losartan Potassium-Hctz] Nausea And Vomiting and Cough    Patient can take Entresto  now    Immunization History  Administered Date(s) Administered   Tdap 09/17/2020    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) (HCC)    Cataract    not a surgical candidate at this time (02/03/2023)   Chest pain of uncertain etiology, non obstructive CAD, pain due to acute HF 12/12/2019   CHF (congestive heart failure) (HCC)    Diabetes mellitus without complication (  HCC)    Diabetic peripheral neuropathy (HCC)    bilateral feet   Heart murmur    childhood   Hyperlipidemia    on meds   Hypertension    on meds   Hypoventilation associated with obesity (HCC) 12/12/2019   Morbid obesity (HCC)    NICM (nonischemic cardiomyopathy) (HCC)    OSA (obstructive sleep apnea) 12/12/2019   uses CPAP   Pulmonary hypertension, unspecified (HCC) 12/12/2019    Tobacco History: Social History   Tobacco Use  Smoking Status Never  Smokeless Tobacco Never   Counseling given: Not  Answered   Outpatient Medications Prior to Visit  Medication Sig Dispense Refill   aspirin  81 MG EC tablet Take 1 tablet (81 mg total) by mouth daily. 90 tablet 3   atorvastatin  (LIPITOR) 80 MG tablet Take 1 tablet (80 mg total) by mouth daily. 90 tablet 3   carvedilol  (COREG ) 6.25 MG tablet Take 1 tablet (6.25 mg total) by mouth 2 (two) times daily. NEEDS FOLLOW UP APPOINTMENT FOR MORE REFILLS 60 tablet 0   diclofenac  (VOLTAREN ) 75 MG EC tablet Take 1 tablet (75 mg total) by mouth 2 (two) times daily. 50 tablet 2   docusate sodium  (COLACE) 100 MG capsule Take 1 capsule (100 mg total) by mouth 2 (two) times daily. 10 capsule 0   Dulaglutide  (TRULICITY ) 1.5 MG/0.5ML SOAJ Inject 1.5 mg into the skin once a week. 2 mL 2   Dulaglutide  (TRULICITY ) 3 MG/0.5ML SOAJ Inject 3 mg into the skin once a week. 2 mL 6   gabapentin  (NEURONTIN ) 100 MG capsule Take 1 capsule (100 mg total) by mouth 3 (three) times daily. 90 capsule 3   glucose blood (TRUE METRIX BLOOD GLUCOSE TEST) test strip Use as instructed 100 each 12   latanoprost  (XALATAN ) 0.005 % ophthalmic solution Place 1 drop into both eyes at bedtime. 10 mL 3   Multiple Vitamins-Minerals (ZINC  PO) Take 1 tablet by mouth daily at 6 (six) AM.     polyethylene glycol (MIRALAX  / GLYCOLAX ) 17 g packet Take 17 g by mouth daily as needed (constipation). 14 each 0   sacubitril -valsartan  (ENTRESTO ) 49-51 MG Take 1 tablet by mouth 2 (two) times daily. 180 tablet 3   spironolactone  (ALDACTONE ) 25 MG tablet Take 1 tablet (25 mg total) by mouth daily. NEEDS FOLLOW UP FOR ANYMORE REFILLS 30 tablet 5   TRUEplus Lancets 28G MISC Use as directed in the morning and at bedtime. 100 each 1   Vitamin D -Vitamin K (K2 PLUS D3 PO) Take 1 tablet by mouth daily.     Azelastine  HCl 0.15 % SOLN Place 1 spray into both nostrils 2 (two) times daily. (Patient not taking: Reported on 12/14/2023) 30 mL 0   Chlorphen-PE-Acetaminophen  (NOREL AD) 4-10-325 MG TABS Take 1 tablet by mouth  every 4 - 6 hours as needed for symptoms. NO MORE THAN 6 TABLETS WITHIN 24 HOURS. (Patient not taking: Reported on 12/14/2023) 20 tablet 0   hydrOXYzine  (ATARAX ) 10 MG tablet Take 1 tablet (10 mg total) by mouth 3 (three) times daily as needed. (Patient not taking: Reported on 12/14/2023) 30 tablet 0   Insulin  Pen Needle (TRUEPLUS 5-BEVEL PEN NEEDLES) 31G X 5 MM MISC Use daily at 2 PM. (Patient not taking: Reported on 12/14/2023) 100 each 11   torsemide  (DEMADEX ) 20 MG tablet Take 1 tablet (20 mg total) by mouth daily. Please call to schedule follow up for any additional refills [phone] thanks (Patient not taking: Reported on 12/14/2023) 30  tablet 3   No facility-administered medications prior to visit.     Review of Systems:   Constitutional: No weight loss or gain, night sweats, fevers, chills,or lassitude. +fatigue  HEENT: No headaches, difficulty swallowing, tooth/dental problems, or sore throat. No sneezing, itching, ear ache, nasal congestion, or post nasal drip CV:  +intermittent CP. No orthopnea, PND, swelling in lower extremities, anasarca, dizziness, palpitations, syncope Resp: +snoring, witnessed apneas. No shortness of breath with exertion or at rest. No excess mucus or change in color of mucus. No productive or non-productive. No hemoptysis. No wheezing.   GI:  No heartburn, indigestion, abdominal pain, nausea, vomiting, diarrhea, change in bowel habits, loss of appetite, bloody stools.  GU: No dysuria, change in color of urine, urgency, hematuria or frequency.  +nocturia  Skin: No rash, lesions, ulcerations MSK:  +RLE, LLE pain (tendinitis; followed by podiatry) Neuro: No dizziness or lightheadedness.  Psych: No depression or anxiety. Mood stable. +sleep disturbance     Physical Exam:  BP (!) 126/90 (BP Location: Left Arm, Patient Position: Sitting, Cuff Size: Normal)   Pulse 79   Ht 5\' 4"  (1.626 m)   Wt 230 lb 6.4 oz (104.5 kg)   SpO2 98%   BMI 39.55 kg/m   GEN:  Pleasant, interactive, well-kempt; obese; in no acute distress HEENT:  Normocephalic and atraumatic. PERRLA. Sclera white. Nasal turbinates pink, moist and patent bilaterally. No rhinorrhea present. Oropharynx pink and moist, without exudate or edema. No lesions, ulcerations, or postnasal drip. Mallampati III/IV NECK:  Supple w/ fair ROM. No JVD present. Thyroid  symmetrical with no goiter or nodules palpated. No lymphadenopathy.   CV: RRR, no m/r/g, no peripheral edema. Pulses intact, +2 bilaterally. No cyanosis, pallor or clubbing. PULMONARY:  Unlabored, regular breathing. Clear bilaterally A&P w/o wheezes/rales/rhonchi. No accessory muscle use.  GI: BS present and normoactive. Soft, non-tender to palpation. No organomegaly or masses detected. No CVA tenderness. MSK: No erythema, warmth or tenderness. Cap refil <2 sec all extrem.  Neuro: A/Ox3. No focal deficits noted.   Skin: Warm, no lesions or rashe Psych: Normal affect and behavior. Judgement and thought content appropriate.     Lab Results:  CBC    Component Value Date/Time   WBC 9.5 11/12/2023 0334   RBC 4.41 11/12/2023 0334   HGB 12.5 11/12/2023 0334   HGB 12.5 03/29/2023 1357   HCT 38.1 11/12/2023 0334   HCT 38.8 03/29/2023 1357   PLT 232 11/12/2023 0334   PLT 249 03/29/2023 1357   MCV 86.4 11/12/2023 0334   MCV 88 03/29/2023 1357   MCH 28.3 11/12/2023 0334   MCHC 32.8 11/12/2023 0334   RDW 13.1 11/12/2023 0334   RDW 11.8 03/29/2023 1357   LYMPHSABS 3.1 03/24/2022 1116   MONOABS 0.6 03/20/2021 0454   EOSABS 0.2 03/24/2022 1116   BASOSABS 0.0 03/24/2022 1116    BMET    Component Value Date/Time   NA 137 11/12/2023 0334   NA 138 03/29/2023 1357   K 4.0 11/12/2023 0334   CL 96 (L) 11/12/2023 0334   CO2 32 11/12/2023 0334   GLUCOSE 182 (H) 11/12/2023 0334   BUN 31 (H) 11/12/2023 0334   BUN 13 03/29/2023 1357   CREATININE 1.38 (H) 11/12/2023 0334   CREATININE 0.85 03/02/2014 1603   CALCIUM  9.7 11/12/2023 0334    GFRNONAA 45 (L) 11/12/2023 0334   GFRAA 66 09/17/2020 1031    BNP    Component Value Date/Time   BNP 420.3 (H) 07/27/2020 0150  Imaging:  DG Foot Complete Left Result Date: 12/12/2023 Please see detailed radiograph report in office note.  DG Foot Complete Right Result Date: 12/12/2023 Please see detailed radiograph report in office note.   Administration History     None           No data to display          No results found for: "NITRICOXIDE"      Assessment & Plan:   OSA (obstructive sleep apnea) Very severe OSA with severe oxygen desaturations on prior testing. Previously on CPAP provided through charity. No DME company. She had suboptimal compliance previously and has been off therapy for years now. She is willing to resume CPAP therapy at this time. Understands risks of untreated severe OSA and the role this is playing in her cardiac disease.  She has snoring, excessive daytime sleepiness, nocturnal apneic events, morning headaches, nocturia, restless sleep. BMI 39. History of severe OSA. Given this, high suspicion she still has sleep disordered breathing with obstructive sleep apnea. She will need repeat urgent sleep study for further evaluation - will start with HST. May need repeat CPAP titration in the future. Prior split night controlled on CPAP 17 cmH2O.    - discussed how weight can impact sleep and risk for sleep disordered breathing - discussed options to assist with weight loss: combination of diet modification, cardiovascular and strength training exercises   - had an extensive discussion regarding the adverse health consequences related to untreated sleep disordered breathing - specifically discussed the risks for hypertension, coronary artery disease, cardiac dysrhythmias, cerebrovascular disease, and diabetes - lifestyle modification discussed   - discussed how sleep disruption can increase risk of accidents, particularly when driving -  safe driving practices were discussed  Patient Instructions  You have a history of very severe seep apnea with significantly oxygen drops at night. You need to get back started on CPAP therapy. Since you have been off of therapy and need a new machine, you will need a repeat sleep study to reassess. I have ordered this urgently. Someone will contact you to schedule this.   We discussed how untreated sleep apnea puts an individual at risk for cardiac arrhthymias, pulm HTN, DM, stroke and increases their risk for daytime accidents. We also briefly reviewed treatment options including weight loss, side sleeping position, oral appliance, CPAP therapy or referral to ENT for possible surgical options  Use caution when driving and pull over if you become sleepy.  Call Dr. Andria Keeler office to see if you can get a sooner appt. We will also reach out to their schedulers  Follow up in 6 weeks with Katie Ximenna Fonseca,NP to go over sleep study results, or sooner, if needed. Friday PM virtual clinic preferred       Chest pain Intermittent, exertional CP. EKG without significant t wave abnormalities and troponins were neg. CXR with cardiomegaly and clear lungs. No active CP today. ED precautions reviewed. Advised to contact her cardiologist for further evaluation. She has a significant cardiac hx and has not been seen since 2022.   Advised if symptoms do not improve or worsen, to please contact office for sooner follow up or seek emergency care.   I spent 35 minutes of dedicated to the care of this patient on the date of this encounter to include pre-visit review of records, face-to-face time with the patient discussing conditions above, post visit ordering of testing, clinical documentation with the electronic health record, making appropriate referrals as documented, and  communicating necessary findings to members of the patients care team.  Roetta Clarke, NP 12/14/2023  Pt aware and understands NP's role.

## 2023-12-14 NOTE — Assessment & Plan Note (Signed)
 Intermittent, exertional CP. EKG without significant t wave abnormalities and troponins were neg. CXR with cardiomegaly and clear lungs. No active CP today. ED precautions reviewed. Advised to contact her cardiologist for further evaluation. She has a significant cardiac hx and has not been seen since 2022.

## 2023-12-14 NOTE — Telephone Encounter (Signed)
 Dr. Clydia Dart was to prescribe Ciclopirox topical solution but she do not see it in her medication list or got a call from pharmacy- please have sent to: [facility] Phone: [phone]  Fax: [phone]

## 2023-12-14 NOTE — Assessment & Plan Note (Addendum)
 Very severe OSA with severe oxygen desaturations on prior testing. Previously on CPAP provided through charity. No DME company. She had suboptimal compliance previously and has been off therapy for years now. She is willing to resume CPAP therapy at this time. Understands risks of untreated severe OSA and the role this is playing in her cardiac disease.  She has snoring, excessive daytime sleepiness, nocturnal apneic events, morning headaches, nocturia, restless sleep. BMI 39. History of severe OSA. Given this, high suspicion she still has sleep disordered breathing with obstructive sleep apnea. She will need repeat urgent sleep study for further evaluation - will start with HST. May need repeat CPAP titration in the future. Prior split night controlled on CPAP 17 cmH2O.    - discussed how weight can impact sleep and risk for sleep disordered breathing - discussed options to assist with weight loss: combination of diet modification, cardiovascular and strength training exercises   - had an extensive discussion regarding the adverse health consequences related to untreated sleep disordered breathing - specifically discussed the risks for hypertension, coronary artery disease, cardiac dysrhythmias, cerebrovascular disease, and diabetes - lifestyle modification discussed   - discussed how sleep disruption can increase risk of accidents, particularly when driving - safe driving practices were discussed  Patient Instructions  You have a history of very severe seep apnea with significantly oxygen drops at night. You need to get back started on CPAP therapy. Since you have been off of therapy and need a new machine, you will need a repeat sleep study to reassess. I have ordered this urgently. Someone will contact you to schedule this.   We discussed how untreated sleep apnea puts an individual at risk for cardiac arrhthymias, pulm HTN, DM, stroke and increases their risk for daytime accidents. We also  briefly reviewed treatment options including weight loss, side sleeping position, oral appliance, CPAP therapy or referral to ENT for possible surgical options  Use caution when driving and pull over if you become sleepy.  Call Dr. Andria Keeler office to see if you can get a sooner appt. We will also reach out to their schedulers  Follow up in 6 weeks with Katie Anniebelle Devore,NP to go over sleep study results, or sooner, if needed. Friday PM virtual clinic preferred

## 2023-12-15 ENCOUNTER — Telehealth: Payer: Self-pay

## 2023-12-15 ENCOUNTER — Encounter: Payer: Self-pay | Admitting: Nurse Practitioner

## 2023-12-15 ENCOUNTER — Other Ambulatory Visit: Payer: Self-pay

## 2023-12-15 NOTE — Telephone Encounter (Signed)
 Pharmacy Patient Advocate Encounter   Received notification from CoverMyMeds that prior authorization for TRULICITY  is required/requested.   Insurance verification completed.   The patient is insured through [employer] .   Per test claim: PA required; PA submitted to above mentioned insurance via CoverMyMeds Key/confirmation #/EOC [id number] Status is pending

## 2023-12-15 NOTE — Telephone Encounter (Signed)
 PA for MRI will be denied because no details of failed activity modifications or a P2P can be done by calling [phone] and reference Tracking # [id number]

## 2023-12-16 ENCOUNTER — Other Ambulatory Visit: Payer: Self-pay

## 2023-12-17 ENCOUNTER — Other Ambulatory Visit: Payer: Self-pay

## 2023-12-17 NOTE — Progress Notes (Signed)
   12/17/2023 Name: Maria Duncan MRN: [id number] DOB: [birth year]  Subjective:  Called patient for medication management telephone visit scheduled today. Patient reported chest pain and tightness that has been going on for several days. She notes she saw pulmonology last week who instructed her to go to the ED if symptoms did not improve or worsened. She was trying to wait to be evaluated until her appointment with [facility] on 12/20/23. Notes that her symptoms have been worsening, says she cannot get up or move around without sharp chest pain. Instructed patient to immediately go to the emergency room for further evaluation and she expressed understanding. Stated she planned to go to the ED as soon as our call ended or call 911 if she was unable to get there with assistance from her family member at home.  Georga Killings, PharmD PGY-1 Pharmacy Resident

## 2023-12-19 ENCOUNTER — Telehealth: Payer: Self-pay

## 2023-12-19 ENCOUNTER — Other Ambulatory Visit: Payer: Self-pay

## 2023-12-19 ENCOUNTER — Telehealth (HOSPITAL_COMMUNITY): Payer: Self-pay

## 2023-12-19 NOTE — Progress Notes (Signed)
 ADVANCED HF CLINIC NOTE   PCP: Maria Morel, NP HF Cardiologist: Dr Maria Duncan  HPI: [name] is a 58 y.o. with a history of HTN, obesity, and NICM/systolic heart failure with recovered EF.   Admitted 5/21 with new onset HF. Echo EF 35-40%. RV moderately HK.  R/LHC with minimal CAD, elevated filling pressures and preserved cardiac output. Diuresed with IV lasix  and transitioned to torsemide  20 mg daily. Also concern for sleep apnea.   Echo 05/11/20: EF 55% RV ok.   Admitted 1/22 with respiratory failure due to COVID PNA. Was in hospital for 17 days and discharged on O2.   Seen in AHF clinic 2/22 and doing well. Echo (4/22) showed EF stable 55-60%, mild LVH, grade I DD, RV ok. She was then graduated from [facility] clinic and referred to [facility] Dr. Domnick Duncan.  She presented for an acute visit 09/01/21 for fatigue. She had stopped all HF meds for 4 days. Labs at visit showed blood glucose > 600 and advised to go to ED for evaluation, however she declined and went to PCP. CBC, iron studies and TSH normal. Suspect symptoms related to uncontrolled blood sugars, and OSA.  Graduated from AHF clinic 08/2021.   Chest pain ***. Workup was unremarkable.   Today she returns for AHF follow up. Overall feeling ***. Denies palpitations, CP, dizziness, edema, or PND/Orthopnea. *** SOB. Appetite ok. No fever or chills. Weight at home *** pounds. Taking all medications. Denies ETOH, tobacco or drug use.    Cardiac Studies: - Echo (4/22): EF stable 55-60%, mild LVH, grade I DD, RV ok.  - Echo (8/21): EF 55%, RV ok.  - R/LHC 5/21 Ao = 131/86 (109) LV = 138/29 RA =   9 RV = 58/15 PA = 63/23 (38) PCW = 21 Fick cardiac output/index = 5.2/2.4 PVR = 3.2 WU FA sat = 93% PA sat = 62%, 67%  ABG: 7.27/88/81/93%  Assessment: 1. 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 Severe NICM EF 25% suspect due to HTN and undiagnosed OSA 3. Elevated filling pressures with normal cardiac output 4. OHS/OSA with CO2  retention (patient received 2mg  versed  and 25mcg fentanyl  during cath)  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) (HCC)    Cataract    not a surgical candidate at this time (02/03/2023)   Chest pain of uncertain etiology, non obstructive CAD, pain due to acute HF 12/12/2019   CHF (congestive heart failure) (HCC)    Diabetes mellitus without complication (HCC)    Diabetic peripheral neuropathy (HCC)    bilateral feet   Heart murmur    childhood   Hyperlipidemia    on meds   Hypertension    on meds   Hypoventilation associated with obesity (HCC) 12/12/2019   Morbid obesity (HCC)    NICM (nonischemic cardiomyopathy) (HCC)    OSA (obstructive sleep apnea) 12/12/2019   uses CPAP   Pulmonary hypertension, unspecified (HCC) 12/12/2019   Current Outpatient Medications  Medication Sig Dispense Refill   ciclopirox (PENLAC) 8 % solution Apply topically at bedtime. Apply over nail and surrounding skin. Apply daily over previous coat. After seven (7) days, may remove with alcohol and continue cycle. 6.6 mL 2   aspirin  81 MG EC tablet Take 1 tablet (81 mg total) by mouth daily. 90 tablet 3   atorvastatin  (LIPITOR) 80 MG tablet Take 1 tablet (80 mg total) by mouth daily. 90 tablet 3   Azelastine  HCl 0.15 %  SOLN Place 1 spray into both nostrils 2 (two) times daily. (Patient not taking: Reported on 12/14/2023) 30 mL 0   carvedilol  (COREG ) 6.25 MG tablet Take 1 tablet (6.25 mg total) by mouth 2 (two) times daily. NEEDS FOLLOW UP APPOINTMENT FOR MORE REFILLS 60 tablet 0   Chlorphen-PE-Acetaminophen  (NOREL AD) 4-10-325 MG TABS Take 1 tablet by mouth every 4 - 6 hours as needed for symptoms. NO MORE THAN 6 TABLETS WITHIN 24 HOURS. (Patient not taking: Reported on 12/14/2023) 20 tablet 0   diclofenac  (VOLTAREN ) 75 MG EC tablet Take 1 tablet (75 mg total) by mouth 2 (two) times daily. 50 tablet 2   docusate sodium  (COLACE) 100 MG capsule Take 1 capsule  (100 mg total) by mouth 2 (two) times daily. 10 capsule 0   Dulaglutide  (TRULICITY ) 1.5 MG/0.5ML SOAJ Inject 1.5 mg into the skin once a week. 2 mL 2   Dulaglutide  (TRULICITY ) 3 MG/0.5ML SOAJ Inject 3 mg into the skin once a week. 2 mL 6   gabapentin  (NEURONTIN ) 100 MG capsule Take 1 capsule (100 mg total) by mouth 3 (three) times daily. 90 capsule 3   glucose blood (TRUE METRIX BLOOD GLUCOSE TEST) test strip Use as instructed 100 each 12   hydrOXYzine  (ATARAX ) 10 MG tablet Take 1 tablet (10 mg total) by mouth 3 (three) times daily as needed. (Patient not taking: Reported on 12/14/2023) 30 tablet 0   Insulin  Pen Needle (TRUEPLUS 5-BEVEL PEN NEEDLES) 31G X 5 MM MISC Use daily at 2 PM. (Patient not taking: Reported on 12/14/2023) 100 each 11   latanoprost  (XALATAN ) 0.005 % ophthalmic solution Place 1 drop into both eyes at bedtime. 10 mL 3   Multiple Vitamins-Minerals (ZINC  PO) Take 1 tablet by mouth daily at 6 (six) AM.     polyethylene glycol (MIRALAX  / GLYCOLAX ) 17 g packet Take 17 g by mouth daily as needed (constipation). 14 each 0   sacubitril -valsartan  (ENTRESTO ) 49-51 MG Take 1 tablet by mouth 2 (two) times daily. 180 tablet 3   spironolactone  (ALDACTONE ) 25 MG tablet Take 1 tablet (25 mg total) by mouth daily. NEEDS FOLLOW UP FOR ANYMORE REFILLS 30 tablet 5   torsemide  (DEMADEX ) 20 MG tablet Take 1 tablet (20 mg total) by mouth daily. Please call to schedule follow up for any additional refills [phone] thanks (Patient not taking: Reported on 12/14/2023) 30 tablet 3   TRUEplus Lancets 28G MISC Use as directed in the morning and at bedtime. 100 each 1   Vitamin D -Vitamin K (K2 PLUS D3 PO) Take 1 tablet by mouth daily.     No current facility-administered medications for this visit.   Allergies  Allergen Reactions   Hyzaar [Losartan Potassium-Hctz] Nausea And Vomiting and Cough    Patient can take Entresto  now   Social History   Socioeconomic History   Marital status: Married     Spouse name: Not on file   Number of children: Not on file   Years of education: Not on file   Highest education level: Not on file  Occupational History   Not on file  Tobacco Use   Smoking status: Never   Smokeless tobacco: Never  Vaping Use   Vaping status: Never Used  Substance and Sexual Activity   Alcohol use: No   Drug use: No   Sexual activity: Yes    Birth control/protection: None  Other Topics Concern   Not on file  Social History Narrative   Not on file   Social  Drivers of Health   Financial Resource Strain: High Risk (12/06/2023)   Overall Financial Resource Strain (CARDIA)    Difficulty of Paying Living Expenses: Very hard  Food Insecurity: Patient Declined (12/06/2023)   Hunger Vital Sign    Worried About Running Out of Food in the Last Year: Patient declined    Ran Out of Food in the Last Year: Patient declined  Recent Concern: Food Insecurity - Food Insecurity Present (11/14/2023)   Hunger Vital Sign    Worried About Running Out of Food in the Last Year: Sometimes true    Ran Out of Food in the Last Year: Sometimes true  Transportation Needs: No Transportation Needs (11/14/2023)   PRAPARE - Administrator, Civil Service (Medical): No    Lack of Transportation (Non-Medical): No  Physical Activity: Not on file  Stress: Not on file  Social Connections: Not on file  Intimate Partner Violence: Not on file   Family History  Problem Relation Age of Onset   Colon polyps Mother 14   Breast cancer Mother 56   Hyperlipidemia Mother    Diabetes Father    Hypertension Father    Hyperlipidemia Sister    Hypertension Sister    Hyperlipidemia Brother    Hypertension Brother    Cancer Maternal Grandmother    Stroke Maternal Grandmother    Esophageal cancer Maternal Grandfather 41   Colon cancer Maternal Grandfather 72   Colon polyps Maternal Grandfather 72   Cancer Maternal Grandfather    Diabetes Maternal Grandfather    Heart disease Maternal  Grandfather    Hypertension Maternal Grandfather    Stomach cancer Paternal Grandmother 20   Diabetes Paternal Grandmother    Hypertension Paternal Grandmother    Cancer Paternal Grandfather    Hypertension Paternal Grandfather    Stroke Paternal Grandfather    Rectal cancer Neg Hx    There were no vitals taken for this visit.  Wt Readings from Last 3 Encounters:  12/14/23 104.5 kg (230 lb 6.4 oz)  12/12/23 102.1 kg (225 lb)  11/24/23 102.1 kg (225 lb)   PHYSICAL EXAM: General:  *** appearing.  No respiratory difficulty HEENT: normal Neck: supple. JVD *** cm.  Cor: PMI nondisplaced. Regular rate & rhythm. No rubs, gallops or murmurs. Lungs: clear Abdomen: soft, nontender, nondistended. Good bowel sounds. Extremities: no cyanosis, clubbing, rash, edema  Neuro: alert & oriented x 3. Moves all 4 extremities w/o difficulty. Affect pleasant.   ASSESSMENT & PLAN: 1. Chronic Systolic Heart Failure, NICM (likely hypertensive) - Echo 11/2019 EF 35-40% RV moderately reduced.  - Cath 11/2019 with minimal cardiac disease and preserved cardiac output. EF on cath 25%.  - Echo 05/11/20: EF 55% RV ok Personally reviewed - Echo 4/22 EF 55-60%, mild LVH, grade I DD, RV ok - NYHA I-II. Volume status ok. - Continue torsemide  20 mg daily. - Continue carvedilol  6.25 mg bid.  - Continue Entresto  49/51 mg bid. - Continue spiro 25 mg daily. - With recent yeast infection and A1C > 15.5, stop Farxiga . - Recent BMET ok.  2. HTN - Stable.  - Continue current regimen.  3. Obesity  There is no height or weight on file to calculate BMI. - Continue weight loss efforts.   4. Suspected Sleep Apnea  - Needs to wear CPAP, has sleep study arranged.  5. DMII - Uncontrolled, recent A21c > 15.5 - Followed by PCP. - Stop Farxiga .  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 - Remains fatigued.  Stable from HF standpoint. Will graduate from AHF clinic and refer to [facility] general cardiology, Dr.  Theodis Fiscal. We will be happy to see back on an as needed basis. ***  Sheryl Donna FNP 2:20 PM

## 2023-12-19 NOTE — Telephone Encounter (Signed)
 Called to confirm/remind patient of their appointment at the [facility] on 12/20/2023 9:30.   Appointment:   [x] Confirmed  [] Left mess   [] No answer/No voice mail  [] VM Full/unable to leave message  [] Phone not in service  Patient reminded to bring all medications and/or complete list.  Confirmed patient has transportation. Gave directions, instructed to utilize valet parking.

## 2023-12-19 NOTE — Telephone Encounter (Signed)
 Pharmacy Patient Advocate Encounter  Received notification from [facility] that Prior Authorization for TRULICITY  has been APPROVED from 12/15/2023 to 12/14/2024   PA #/Case ID/Reference #: [id number]

## 2023-12-20 ENCOUNTER — Ambulatory Visit: Admitting: Podiatry

## 2023-12-20 ENCOUNTER — Ambulatory Visit (HOSPITAL_COMMUNITY): Payer: Self-pay | Admitting: Family Medicine

## 2023-12-20 ENCOUNTER — Encounter

## 2023-12-20 ENCOUNTER — Other Ambulatory Visit: Payer: Self-pay

## 2023-12-20 ENCOUNTER — Ambulatory Visit

## 2023-12-20 ENCOUNTER — Ambulatory Visit (HOSPITAL_COMMUNITY)
Admission: RE | Admit: 2023-12-20 | Discharge: 2023-12-20 | Disposition: A | Discharge status: Home / Self Care | Source: Ambulatory Visit | Attending: Family Medicine | Admitting: Family Medicine

## 2023-12-20 ENCOUNTER — Encounter (HOSPITAL_COMMUNITY): Payer: Self-pay

## 2023-12-20 VITALS — BP 126/90 | HR 84 | Ht 65.5 in | Wt 228.0 lb

## 2023-12-20 DIAGNOSIS — I428 Other cardiomyopathies: Secondary | ICD-10-CM | POA: Diagnosis not present

## 2023-12-20 DIAGNOSIS — I110 Hypertensive heart disease with heart failure: Secondary | ICD-10-CM | POA: Diagnosis not present

## 2023-12-20 DIAGNOSIS — R0789 Other chest pain: Secondary | ICD-10-CM | POA: Diagnosis not present

## 2023-12-20 DIAGNOSIS — R0602 Shortness of breath: Secondary | ICD-10-CM | POA: Diagnosis not present

## 2023-12-20 DIAGNOSIS — Z794 Long term (current) use of insulin: Secondary | ICD-10-CM | POA: Diagnosis not present

## 2023-12-20 DIAGNOSIS — Z7982 Long term (current) use of aspirin: Secondary | ICD-10-CM | POA: Diagnosis not present

## 2023-12-20 DIAGNOSIS — Z5986 Financial insecurity: Secondary | ICD-10-CM | POA: Insufficient documentation

## 2023-12-20 DIAGNOSIS — E669 Obesity, unspecified: Secondary | ICD-10-CM | POA: Diagnosis not present

## 2023-12-20 DIAGNOSIS — E119 Type 2 diabetes mellitus without complications: Secondary | ICD-10-CM | POA: Insufficient documentation

## 2023-12-20 DIAGNOSIS — Z79899 Other long term (current) drug therapy: Secondary | ICD-10-CM | POA: Insufficient documentation

## 2023-12-20 DIAGNOSIS — I2510 Atherosclerotic heart disease of native coronary artery without angina pectoris: Secondary | ICD-10-CM | POA: Insufficient documentation

## 2023-12-20 DIAGNOSIS — Z6837 Body mass index (BMI) 37.0-37.9, adult: Secondary | ICD-10-CM | POA: Insufficient documentation

## 2023-12-20 DIAGNOSIS — I5022 Chronic systolic (congestive) heart failure: Secondary | ICD-10-CM | POA: Insufficient documentation

## 2023-12-20 DIAGNOSIS — I10 Essential (primary) hypertension: Secondary | ICD-10-CM

## 2023-12-20 DIAGNOSIS — I5042 Chronic combined systolic (congestive) and diastolic (congestive) heart failure: Secondary | ICD-10-CM

## 2023-12-20 DIAGNOSIS — G4733 Obstructive sleep apnea (adult) (pediatric): Secondary | ICD-10-CM

## 2023-12-20 LAB — COMPREHENSIVE METABOLIC PANEL WITH GFR
ALT: 22 U/L (ref 0–44)
AST: 16 U/L (ref 15–41)
Albumin: 3.5 g/dL (ref 3.5–5.0)
Alkaline Phosphatase: 92 U/L (ref 38–126)
Anion gap: 9 (ref 5–15)
BUN: 15 mg/dL (ref 6–20)
CO2: 30 mmol/L (ref 22–32)
Calcium: 10 mg/dL (ref 8.9–10.3)
Chloride: 101 mmol/L (ref 98–111)
Creatinine, Ser: 0.8 mg/dL (ref 0.44–1.00)
GFR, Estimated: >60 mL/min (ref >60)
Glucose, Bld: 144 mg/dL — ABNORMAL HIGH (ref 70–99)
Potassium: 4 mmol/L (ref 3.5–5.1)
Sodium: 140 mmol/L (ref 135–145)
Total Bilirubin: 1.1 mg/dL (ref 0.0–1.2)
Total Protein: 6.9 g/dL (ref 6.5–8.1)

## 2023-12-20 LAB — AMYLASE: Amylase: 125 U/L — ABNORMAL HIGH (ref 28–100)

## 2023-12-20 LAB — CBC
HCT: 37 % (ref 36.0–46.0)
Hemoglobin: 12 g/dL (ref 12.0–15.0)
MCH: 28 pg (ref 26.0–34.0)
MCHC: 32.4 g/dL (ref 30.0–36.0)
MCV: 86.2 fL (ref 80.0–100.0)
Platelets: 234 10*3/uL (ref 150–400)
RBC: 4.29 MIL/uL (ref 3.87–5.11)
RDW: 12.6 % (ref 11.5–15.5)
WBC: 7.1 10*3/uL (ref 4.0–10.5)
nRBC: 0 % (ref 0.0–0.2)

## 2023-12-20 LAB — C-REACTIVE PROTEIN: CRP: 0.6 mg/dL (ref <1.0)

## 2023-12-20 LAB — BRAIN NATRIURETIC PEPTIDE: B Natriuretic Peptide: 40.6 pg/mL (ref 0.0–100.0)

## 2023-12-20 LAB — SEDIMENTATION RATE: Sed Rate: 15 mm/h (ref 0–22)

## 2023-12-20 MED ORDER — TORSEMIDE 20 MG PO TABS
20.0000 mg | ORAL_TABLET | Freq: Every day | ORAL | 3 refills | Status: AC | PRN
  Filled 2023-12-20: qty 30, 30d supply, fill #0

## 2023-12-20 MED ORDER — POTASSIUM CHLORIDE CRYS ER 20 MEQ PO TBCR
20.0000 meq | EXTENDED_RELEASE_TABLET | Freq: Every day | ORAL | 3 refills | Status: AC | PRN
  Filled 2023-12-20: qty 30, 30d supply, fill #0

## 2023-12-20 MED ORDER — SPIRONOLACTONE 25 MG PO TABS
25.0000 mg | ORAL_TABLET | Freq: Every day | ORAL | 5 refills | Status: AC
  Filled 2023-12-20 – 2024-03-11 (×2): qty 30, 30d supply, fill #0
  Filled 2024-04-30: qty 30, 30d supply, fill #1
  Filled 2024-07-01: qty 30, 30d supply, fill #2

## 2023-12-20 MED ORDER — ATORVASTATIN CALCIUM 80 MG PO TABS
80.0000 mg | ORAL_TABLET | Freq: Every day | ORAL | 3 refills | Status: AC
  Filled 2023-12-20 – 2024-03-11 (×3): qty 90, 90d supply, fill #0
  Filled 2024-07-01: qty 90, 90d supply, fill #1

## 2023-12-20 MED ORDER — CARVEDILOL 6.25 MG PO TABS
6.2500 mg | ORAL_TABLET | Freq: Two times a day (BID) | ORAL | 3 refills | Status: AC
  Filled 2023-12-20 – 2024-02-21 (×2): qty 60, 30d supply, fill #0
  Filled 2024-04-08: qty 60, 30d supply, fill #1
  Filled 2024-06-14: qty 60, 30d supply, fill #2

## 2023-12-20 MED ORDER — SACUBITRIL-VALSARTAN 49-51 MG PO TABS
1.0000 | ORAL_TABLET | Freq: Two times a day (BID) | ORAL | 3 refills | Status: AC
  Filled 2023-12-20: qty 180, 90d supply, fill #0
  Filled 2024-04-30 (×2): qty 180, 90d supply, fill #1

## 2023-12-20 NOTE — Progress Notes (Signed)
 ReDS Vest / Clip - 12/20/23 0926       ReDS Vest / Clip   Station Marker B    Ruler Value 36    ReDS Value Range Low volume    ReDS Actual Value 35

## 2023-12-20 NOTE — Patient Instructions (Signed)
 Medication Changes:  TAKE TORSEMIDE  20MG  ONCE DAILY IF NEEDED FOR SWELLING OR WEIGHT GAIN OF 3 POUNDS OVER NIGHT OR 5 POUNDS IN 1 WEEK   TAKE POTASSIUM 20MEQ ONCE DAILY WITH TORSEMIDE  IF NEEDED   Lab Work:  Labs done today, your results will be available in MyChart, we will contact you for abnormal readings.  Testing/Procedures:  ABDOMINAL ULTRASOUND-SCHEDULING WILL REACH OUT TO ARRANGE THIS ONCE APPROVED WITH INSURANCE    ECHOCARDIOGRAM AS SCHEDULED   Follow-Up in: AS SCHEDULED WITH DR. Julane Ny   At the [facility], you and your health needs are our priority. We have a designated team specialized in the treatment of Heart Failure. This Care Team includes your primary Heart Failure Specialized Cardiologist (physician), Advanced Practice Providers (APPs- Physician Assistants and Nurse Practitioners), and Pharmacist who all work together to provide you with the care you need, when you need it.   You may see any of the following providers on your designated Care Team at your next follow up:  Dr. Jules Oar Dr. Peder Bourdon Dr. Alwin Baars Dr. Judyth Nunnery Nieves Bars, NP Ruddy Corral, [place] [facility], [facility], [facility], [place] Dennise Fitz, NP [place] Lee, NP Luster Salters, PharmD   Please be sure to bring in all your medications bottles to every appointment.   Need to Contact Us :  If you have any questions or concerns before your next appointment please send us  a message through [place] or call our office at [phone].    TO LEAVE A MESSAGE FOR THE NURSE SELECT OPTION 2, PLEASE LEAVE A MESSAGE INCLUDING: YOUR NAME DATE OF BIRTH CALL BACK NUMBER REASON FOR CALL**this is important as we prioritize the call backs  YOU WILL RECEIVE A CALL BACK THE SAME DAY AS LONG AS YOU CALL BEFORE 4:00 PM

## 2023-12-21 ENCOUNTER — Telehealth: Payer: Self-pay | Admitting: Pulmonary Disease

## 2023-12-21 DIAGNOSIS — G4733 Obstructive sleep apnea (adult) (pediatric): Secondary | ICD-10-CM | POA: Diagnosis not present

## 2023-12-21 NOTE — Telephone Encounter (Signed)
 Call patient  Sleep study result  Date of study: 12/20/2023  Impression: Severe obstructive sleep apnea with severe oxygen desaturations Oxygen nadir of 44% with AHI of 67.4  Recommendation: Recommend CPAP therapy for severe obstructive sleep apnea An in-lab titration is recommended for optimal treatment as there is severe oxygen desaturations that may require oxygen supplementation in addition to CPAP therapy. Close clinical follow up with compliance monitoring to optimize therapeutic efficiency Weight loss measures encouraged

## 2023-12-22 ENCOUNTER — Telehealth: Payer: Self-pay | Admitting: Nurse Practitioner

## 2023-12-22 NOTE — Telephone Encounter (Signed)
 Pt is aware of results and voiced her understanding.  She agrees with plan. Titration study has been ordered.  Nothing further needed.

## 2023-12-22 NOTE — Telephone Encounter (Signed)
 Copied from CRM [id number]. Topic: Complaint (DO NOT CONVERT) - Staff >> Dec 21, 2023  3:10 PM Santiya F wrote: Date of Incident: 12/06/23   Details of complaint: Patient says she had an appointment with [facility] on 12/06/23 over the phone and she waited and she never got a call. Patient says she spoke with [place] and was told she had overbooked and they had rescheduled to 12/27/23. Patient says she checked her MyChart and saw her visit for 12/06/23 was marked as a No Show. Patient is upset and wanted to speak with the office supervisor because she is displeased with this. She says she has no reason to have a no show when she never got a call and it was confirmed she wasn't called. Looked through and saw the no show was placed from an appointment on 12/17/23, patient is adamant that the no show came from the appointment on the 14th and said her MyChart says no show.   How would the patient like to see it resolved? Patient is requesting a different case worker.   Route to Research officer, political party.

## 2023-12-25 ENCOUNTER — Encounter: Payer: Self-pay | Admitting: Podiatry

## 2023-12-25 ENCOUNTER — Other Ambulatory Visit: Payer: Self-pay

## 2023-12-26 ENCOUNTER — Encounter: Payer: Self-pay | Admitting: Podiatry

## 2023-12-26 NOTE — Telephone Encounter (Signed)
 Insurance is requesting additional information that notes patient tried 4 weeks of treatment (such as bracing, wearing a boot, alternating ice and heat and took medications) and did not get better. This is a preliminary review. A P2P can be done. If additional information not received or P2P not done as soon as possible they will deny the case.  P2P can be done by calling [phone] and using tracking# [id number]

## 2023-12-27 ENCOUNTER — Encounter: Payer: Self-pay | Admitting: Podiatry

## 2023-12-27 ENCOUNTER — Other Ambulatory Visit: Payer: Self-pay

## 2023-12-27 NOTE — Patient Instructions (Signed)
 Visit Information  Maria Duncan was given information about Medicaid Managed Care team care coordination services as a part of their [employer] Medicaid benefit. [name] verbally consentedto engagement with the [facility] Managed Care team.   If you are experiencing a medical emergency, please call 911 or report to your local emergency department or urgent care.   If you have a non-emergency medical problem during routine business hours, please contact your provider's office and ask to speak with a nurse.   For questions related to your [employer] [facility] health plan, please call: [phone]  OR visit the member homepage at: [email or web address]  If you would like to schedule transportation through your [employer] [facility] plan, please call the following number at least 2 days in advance of your appointment: [phone]  If you are experiencing a behavioral health crisis, call the [employer] [place]  Behavioral Health Crisis Line at [phone]). The line is available 24 hours a day, seven days a week.  If you would like help to quit smoking, call 1-800-QUIT-NOW (604-532-9731) OR Espaol: 1-855-Djelo-Ya (5-366-440-3474) o para ms informacin haga clic aqu or Text READY to 259-563 to register via text   [name] - following are the goals we discussed in your visit today:    Goals Addressed   None       The  Patient                                              has been provided with contact information for the Managed Medicaid care management team and has been advised to call with any health related questions or concerns.   Valora Gear, Florestine Hurl, MHA [place]  [facility] Social Worker, Population Health [phone]   Following is a copy of your plan of care:  There are no care plans that you recently modified to display for this patient.

## 2023-12-27 NOTE — Patient Outreach (Signed)
 Complex Care Management   Visit Note  12/27/2023  Name:  Maria Duncan MRN: [id number] DOB: 1965/09/28  Situation: Referral received for Complex Care Management related to rent resources and benefits information for insurance I obtained verbal consent from Patient.  Visit completed with patient  on the phone  Background:   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) (HCC)    Cataract    not a surgical candidate at this time (02/03/2023)   Chest pain of uncertain etiology, non obstructive CAD, pain due to acute HF 12/12/2019   CHF (congestive heart failure) (HCC)    Diabetes mellitus without complication (HCC)    Diabetic peripheral neuropathy (HCC)    bilateral feet   Heart murmur    childhood   Hyperlipidemia    on meds   Hypertension    on meds   Hypoventilation associated with obesity (HCC) 12/12/2019   Morbid obesity (HCC)    NICM (nonischemic cardiomyopathy) (HCC)    OSA (obstructive sleep apnea) 12/12/2019   uses CPAP   Pulmonary hypertension, unspecified (HCC) 12/12/2019    Assessment: SW completed a telephone outreach with patient for resources for utilities. Patient no longer needs resources. No other resources are needed at this time. SW and patient agreed for resources for benefits of [employer] to be emailed to address on file. Patient responded to email, she did receive them.   SDOH Interventions    Flowsheet Row Patient Outreach Telephone from 12/06/2023 in [place] POPULATION HEALTH DEPARTMENT Most recent reading at 12/06/2023  1:36 PM Telephone from 11/14/2023 in [facility] Most recent reading at 11/14/2023  2:17 PM Telephone from 11/14/2023 in [facility] Most recent reading at 11/14/2023  9:23 AM  SDOH Interventions     Food Insecurity Interventions Patient Declined -- Other (Comment)  [Patient stated  that she is going to get assistance]  Housing Interventions -- -- Intervention Not Indicated  Transportation Interventions -- -- Intervention Not Indicated  Utilities Interventions Community Resources Provided Other (Comment)  [H&V Patient Care Fund] --  Financial Strain Interventions Community Resources Provided Other (Comment)  [H&V Patient Care Fund] --       Recommendation:   No recommendations at this time.  Follow Up Plan:   Patient has met all care management goals. Care Management case will be closed. Patient has been provided contact information should new needs arise.  Valora Gear, Florestine Hurl, MHA [place]  [facility] Social Worker, Population Health [phone]

## 2023-12-28 ENCOUNTER — Other Ambulatory Visit: Payer: Self-pay

## 2023-12-30 ENCOUNTER — Other Ambulatory Visit

## 2024-01-09 ENCOUNTER — Ambulatory Visit: Admitting: Podiatry

## 2024-01-10 ENCOUNTER — Ambulatory Visit (HOSPITAL_BASED_OUTPATIENT_CLINIC_OR_DEPARTMENT_OTHER): Attending: Nurse Practitioner | Admitting: Pulmonary Disease

## 2024-01-10 DIAGNOSIS — G4733 Obstructive sleep apnea (adult) (pediatric): Secondary | ICD-10-CM | POA: Insufficient documentation

## 2024-01-12 ENCOUNTER — Ambulatory Visit: Admitting: Nurse Practitioner

## 2024-01-12 ENCOUNTER — Encounter: Admitting: Nurse Practitioner

## 2024-01-12 NOTE — Procedures (Signed)
 [facility] [address] [place], [place] [address] Tel: [phone]   Fax: [phone]  Titration Interpretation  Patient Name:  Maria Duncan, Maria Duncan Date:  01/10/2024 Referring Physician:  Mariah Shines. Cobb, Np  Indications for Polysomnography The patient is a 58 year old Female who is 5' 5 and weighs 224.0 lbs. Her BMI equals 36.9.  A full night titration treatment study was performed. HST 11/2023 Severe obstructive sleep apnea with severe oxygen desaturations Oxygen nadir of 44% with AHI of 67.4   Polysomnogram Data A full night polysomnogram recorded the standard physiologic parameters including EEG, EOG, EMG, EKG, nasal and oral airflow.  Respiratory parameters of chest and abdominal movements were recorded with Respiratory Inductance Plethysmography belts.  Oxygen saturation was recorded by pulse oximetry.   Sleep Architecture The total recording time of the polysomnogram was 384.6 minutes.  The total sleep time was 294.5 minutes.  The patient spent 3.9% of total sleep time in Stage N1, 37.4% in Stage N2, 27.5% in Stages N3, and 31.2% in REM.  Sleep latency was 6.6 minutes.  REM latency was 49.0 minutes.  Sleep Efficiency was 76.6%.  Wake after Sleep Onset time was 83.0 minutes.  Titration Summary The patient was titrated at pressures ranging from 5* cm/H20 up to 14 cm, then on bilevel upto 19/15.  The last pressure used in the study was 19/15/0 cm/H20 .  Respiratory Events The polysomnogram revealed a presence of - obstructive, 1 central, and 1 mixed apnea resulting in an Apnea index of 0.4 events per hour.  There were 93 hypopneas (>=3% desaturation and/or arousal) resulting in an Apnea\Hypopnea Index (AHI >=3% desaturation and/or arousal) of 19.4 events per hour.  There were 59 hypopneas (>=4% desaturation) resulting in an Apnea\Hypopnea Index (AHI >=4% desaturation) of 12.4 events per hour.  There were 16 Respiratory Effort Related Arousals  resulting in a RERA index of 3.3 events per hour. The Respiratory Disturbance Index is 22.6 events per hour.  The snore index was - events per hour.  Mean oxygen saturation was 93.2%.  The lowest oxygen saturation during sleep was 84.0%.  Time spent <=88% oxygen saturation was 13.5 minutes (3.6%).  Limb Activity There were - limb movements recorded.  Of this total, - were classified as PLMs.  Of the PLMs, - were associated with arousals.  The Limb Movement index was - per hour while the PLM index was - per hour.  Cardiac Summary The average pulse rate was 71.0 bpm.  The minimum pulse rate was 59.0 bpm while the maximum pulse rate was 91.0 bpm.  Cardiac rhythm was normal/abnormal.  Diagnosis: Severe OSA, corrected by PAP  Recommendations: Options include CPAP of 18-19 cm, Alternatively autoCPAP 10-20 cm can be used Bilevel can be used at 19/15 cm if patient is unable to tolerate CPAP therapy A small F40 airfit mask was used   This study was personally reviewed and electronically signed by: Dr. Celene Coins Accredited Board Certified in Sleep Medicine

## 2024-01-14 ENCOUNTER — Telehealth: Payer: Self-pay | Admitting: Nurse Practitioner

## 2024-01-14 NOTE — Telephone Encounter (Signed)
 Patient was identified as falling into the [place] Measure - Diabetes.   Patient was: Appointment already scheduled for:  02/28/24.

## 2024-01-16 ENCOUNTER — Ambulatory Visit: Payer: Self-pay | Admitting: Nurse Practitioner

## 2024-01-16 DIAGNOSIS — G4733 Obstructive sleep apnea (adult) (pediatric): Secondary | ICD-10-CM

## 2024-01-16 NOTE — Progress Notes (Signed)
 Changed her CPAP settings to auto 12-20 cmH2O. EPR level 3. Please advise her to use her CPAP nightly. If she's still unable to tolerate CPAP at the higher pressures, may have to switch her to BiPAP. Please schedule f/u in 10-12 weeks. Thanks.

## 2024-01-19 ENCOUNTER — Ambulatory Visit (HOSPITAL_COMMUNITY)
Admission: RE | Admit: 2024-01-19 | Discharge: 2024-01-19 | Disposition: A | Discharge status: Home / Self Care | Payer: Self-pay | Source: Ambulatory Visit | Attending: Internal Medicine | Admitting: Internal Medicine

## 2024-01-19 ENCOUNTER — Other Ambulatory Visit: Payer: Self-pay

## 2024-01-19 ENCOUNTER — Other Ambulatory Visit (HOSPITAL_COMMUNITY): Payer: Self-pay

## 2024-01-19 ENCOUNTER — Telehealth (HOSPITAL_COMMUNITY): Payer: Self-pay

## 2024-01-19 VITALS — BP 114/82 | HR 90 | Wt 229.0 lb

## 2024-01-19 DIAGNOSIS — I2510 Atherosclerotic heart disease of native coronary artery without angina pectoris: Secondary | ICD-10-CM | POA: Diagnosis not present

## 2024-01-19 DIAGNOSIS — E669 Obesity, unspecified: Secondary | ICD-10-CM | POA: Insufficient documentation

## 2024-01-19 DIAGNOSIS — I10 Essential (primary) hypertension: Secondary | ICD-10-CM | POA: Diagnosis not present

## 2024-01-19 DIAGNOSIS — Z5986 Financial insecurity: Secondary | ICD-10-CM | POA: Insufficient documentation

## 2024-01-19 DIAGNOSIS — R079 Chest pain, unspecified: Secondary | ICD-10-CM

## 2024-01-19 DIAGNOSIS — Z6837 Body mass index (BMI) 37.0-37.9, adult: Secondary | ICD-10-CM | POA: Diagnosis not present

## 2024-01-19 DIAGNOSIS — E119 Type 2 diabetes mellitus without complications: Secondary | ICD-10-CM | POA: Insufficient documentation

## 2024-01-19 DIAGNOSIS — Z7985 Long-term (current) use of injectable non-insulin antidiabetic drugs: Secondary | ICD-10-CM | POA: Diagnosis not present

## 2024-01-19 DIAGNOSIS — G4730 Sleep apnea, unspecified: Secondary | ICD-10-CM | POA: Insufficient documentation

## 2024-01-19 DIAGNOSIS — Z7984 Long term (current) use of oral hypoglycemic drugs: Secondary | ICD-10-CM | POA: Insufficient documentation

## 2024-01-19 DIAGNOSIS — Z79899 Other long term (current) drug therapy: Secondary | ICD-10-CM | POA: Diagnosis not present

## 2024-01-19 DIAGNOSIS — I110 Hypertensive heart disease with heart failure: Secondary | ICD-10-CM | POA: Diagnosis not present

## 2024-01-19 DIAGNOSIS — I5042 Chronic combined systolic (congestive) and diastolic (congestive) heart failure: Secondary | ICD-10-CM | POA: Diagnosis present

## 2024-01-19 DIAGNOSIS — Z7982 Long term (current) use of aspirin: Secondary | ICD-10-CM | POA: Diagnosis not present

## 2024-01-19 DIAGNOSIS — I428 Other cardiomyopathies: Secondary | ICD-10-CM | POA: Insufficient documentation

## 2024-01-19 MED ORDER — EMPAGLIFLOZIN 10 MG PO TABS
10.0000 mg | ORAL_TABLET | Freq: Every day | ORAL | 11 refills | Status: DC
  Filled 2024-01-19: qty 30, 30d supply, fill #1
  Filled 2024-01-19: qty 30, 30d supply, fill #0
  Filled 2024-03-11: qty 30, 30d supply, fill #1

## 2024-01-19 NOTE — Patient Instructions (Addendum)
 No Labs done today.   START Jardiance 10mg  (1 tablet) by mouth daily.   No medication changes were made. Please continue all current medications as prescribed.  Your physician has requested that you have cardiac CT. Cardiac computed tomography (CT) is a painless test that uses an x-ray machine to take clear, detailed pictures of your heart. This has to be approved by your insurance, once approved we will contact you to schedule an appointment.    Your physician recommends that you schedule a follow-up appointment in: 4 months with our NP/PA Clinic here in our office.   If you have any questions or concerns before your next appointment please send us  a message through [place] or call our office at [phone].    TO LEAVE A MESSAGE FOR THE NURSE SELECT OPTION 2, PLEASE LEAVE A MESSAGE INCLUDING: YOUR NAME DATE OF BIRTH CALL BACK NUMBER REASON FOR CALL**this is important as we prioritize the call backs  YOU WILL RECEIVE A CALL BACK THE SAME DAY AS LONG AS YOU CALL BEFORE 4:00 PM   Do the following things EVERYDAY: Weigh yourself in the morning before breakfast. Write it down and keep it in a log. Take your medicines as prescribed Eat low salt foods--Limit salt (sodium) to 2000 mg per day.  Stay as active as you can everyday Limit all fluids for the day to less than 2 liters   At the [facility], you and your health needs are our priority. As part of our continuing mission to provide you with exceptional heart care, we have created designated Provider Care Teams. These Care Teams include your primary Cardiologist (physician) and Advanced Practice Providers (APPs- Physician Assistants and Nurse Practitioners) who all work together to provide you with the care you need, when you need it.   You may see any of the following providers on your designated Care Team at your next follow up: Dr Toribio Fuel Dr Ezra Shuck Dr. Ria Gardenia Greig Lenetta, NP Caffie Shed, [place] [facility] [place], [place] Beckey Coe, NP Tinnie Redman, PharmD   Please be sure to bring in all your medications bottles to every appointment.    Thank you for choosing [facility]

## 2024-01-19 NOTE — Progress Notes (Signed)
 Called the pt and there was no answer- LMTCB

## 2024-01-19 NOTE — Addendum Note (Signed)
 Encounter addended by: Alvan Fausto SAUNDERS, CMA on: 01/19/2024 11:14 AM  Actions taken: Diagnosis association updated, Clinical Note Signed, Order list changed

## 2024-01-19 NOTE — Telephone Encounter (Signed)
 Advanced [facility] Failure Patient Advocate Encounter  Prior authorization for Jardiance has been submitted and approved. Test billing returns $4 for 90 day supply.  KeyBETHA [id number] Effective: 01/19/2024 to 01/18/2025  Rachel DEL, CPhT Rx Patient Advocate Phone: [phone]

## 2024-01-19 NOTE — Progress Notes (Signed)
 ADVANCED HF CLINIC NOTE   PCP: Oley Bascom RAMAN, NP HF Cardiologist: Dr Cherrie  HPI: Maria Duncan is a 58 y.o. with a history of HTN, obesity, and NICM/systolic heart failure with recovered EF.   Admitted 5/21 with new onset HF. Echo EF 35-40%. RV moderately HK.  R/LHC with minimal CAD, elevated filling pressures and preserved cardiac output. Diuresed with IV lasix  and transitioned to torsemide  20 mg daily. Also concern for sleep apnea.   Echo 05/11/20: EF 55% RV ok.   Admitted 1/22 with respiratory failure due to COVID PNA. Was in hospital for 17 days and discharged on O2.   Seen in AHF clinic 2/22 and doing well. Echo (4/22) showed EF stable 55-60%, mild LVH, grade I DD, RV ok. She was then graduated from [facility] clinic and referred to [facility] Dr. Rolan.  Acute visit 09/01/21 for fatigue. She had stopped all HF meds for 4 days. Labs at visit showed blood glucose > 600 and advised to go to ED for evaluation, however she declined and went to PCP. CBC, iron studies and TSH normal. Suspect symptoms related to uncontrolled blood sugars, and OSA.  Graduated from AHF clinic 08/2021.  Seen in ED 11/12/23 with CP. HsTroponins negative, ECG without acute changes. Felt symptoms likely MSK and discharged home.  Seen in HF Clinic on 5/258/25 as referral back from Pulmonary. Repeat echo ordered. Not done yet  Here for f/u. Continue with chest pain/pressure and SOB about once per week. Feels like a pulling/pressure. Gets SOB. Not getting better. No edema, orthopnea or PND.     Cardiac Studies: - Echo (4/22): EF stable 55-60%, mild LVH, grade I DD, RV ok.  - Echo (8/21): EF 55%, RV ok.  - R/LHC 5/21 Ao = 131/86 (109) LV = 138/29 RA =   9 RV = 58/15 PA = 63/23 (38) PCW = 21 Fick cardiac output/index = 5.2/2.4 PVR = 3.2 WU FA sat = 93% PA sat = 62%, 67%  ABG: 7.27/88/81/93%   1. 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 Severe NICM EF 25% suspect due to HTN and undiagnosed OSA 3. Elevated  filling pressures with normal cardiac output 4. OHS/OSA with CO2 retention (patient received 2mg  versed  and 25mcg fentanyl  during cath)  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) (HCC)    Cataract    not a surgical candidate at this time (02/03/2023)   Chest pain of uncertain etiology, non obstructive CAD, pain due to acute HF 12/12/2019   CHF (congestive heart failure) (HCC)    Diabetes mellitus without complication (HCC)    Diabetic peripheral neuropathy (HCC)    bilateral feet   Heart murmur    childhood   Hyperlipidemia    on meds   Hypertension    on meds   Hypoventilation associated with obesity (HCC) 12/12/2019   Morbid obesity (HCC)    NICM (nonischemic cardiomyopathy) (HCC)    OSA (obstructive sleep apnea) 12/12/2019   uses CPAP   Pulmonary hypertension, unspecified (HCC) 12/12/2019   Current Outpatient Medications  Medication Sig Dispense Refill   aspirin  81 MG EC tablet Take 1 tablet (81 mg total) by mouth daily. 90 tablet 3   atorvastatin  (LIPITOR) 80 MG tablet Take 1 tablet (80 mg total) by mouth daily. 90 tablet 3   carvedilol  (COREG ) 6.25 MG tablet Take 1 tablet (6.25 mg total) by mouth 2 (two) times daily. 60 tablet 3   Chlorphen-PE-Acetaminophen  (NOREL AD)  4-10-325 MG TABS Take 1 tablet by mouth every 4 - 6 hours as needed for symptoms. NO MORE THAN 6 TABLETS WITHIN 24 HOURS. 20 tablet 0   ciclopirox  (PENLAC ) 8 % solution Apply topically at bedtime. Apply over nail and surrounding skin. Apply daily over previous coat. After seven (7) days, may remove with alcohol and continue cycle. 6.6 mL 2   diclofenac  (VOLTAREN ) 75 MG EC tablet Take 1 tablet (75 mg total) by mouth 2 (two) times daily. 50 tablet 2   docusate sodium  (COLACE) 100 MG capsule Take 1 capsule (100 mg total) by mouth 2 (two) times daily. 10 capsule 0   Dulaglutide  (TRULICITY ) 1.5 MG/0.5ML SOAJ Inject 1.5 mg into the skin once a week. 2 mL 2    Dulaglutide  (TRULICITY ) 3 MG/0.5ML SOAJ Inject 3 mg into the skin once a week. 2 mL 6   gabapentin  (NEURONTIN ) 100 MG capsule Take 1 capsule (100 mg total) by mouth 3 (three) times daily. 90 capsule 3   glucose blood (TRUE METRIX BLOOD GLUCOSE TEST) test strip Use as instructed 100 each 12   Insulin  Pen Needle (TRUEPLUS 5-BEVEL PEN NEEDLES) 31G X 5 MM MISC Use daily at 2 PM. 100 each 11   latanoprost  (XALATAN ) 0.005 % ophthalmic solution Place 1 drop into both eyes at bedtime. 10 mL 3   Multiple Vitamins-Minerals (ZINC  PO) Take 1 tablet by mouth daily at 6 (six) AM.     polyethylene glycol (MIRALAX  / GLYCOLAX ) 17 g packet Take 17 g by mouth daily as needed (constipation). 14 each 0   potassium chloride  SA (KLOR-CON  M) 20 MEQ tablet Take 1 tablet (20 mEq total) by mouth daily as needed. Take 1 tablet (20meq) with torsemide  if needed 30 tablet 3   sacubitril -valsartan  (ENTRESTO ) 49-51 MG Take 1 tablet by mouth 2 (two) times daily. 180 tablet 3   spironolactone  (ALDACTONE ) 25 MG tablet Take 1 tablet (25 mg total) by mouth daily. NEEDS FOLLOW UP FOR ANYMORE REFILLS 30 tablet 5   torsemide  (DEMADEX ) 20 MG tablet Take 1 tablet (20 mg total) by mouth daily as needed. Take 1 tablet (20mg ) daily as needed- please take 1 tablet (20meq) of potassium with this if needed 30 tablet 3   TRUEplus Lancets 28G MISC Use as directed in the morning and at bedtime. 100 each 1   Vitamin D -Vitamin K (K2 PLUS D3 PO) Take 1 tablet by mouth daily.     No current facility-administered medications for this encounter.   Allergies  Allergen Reactions   Hyzaar [Losartan Potassium-Hctz] Nausea And Vomiting and Cough    Patient can take Entresto  now   Social History   Socioeconomic History   Marital status: Married    Spouse name: Not on file   Number of children: Not on file   Years of education: Not on file   Highest education level: Not on file  Occupational History   Not on file  Tobacco Use   Smoking status:  Never   Smokeless tobacco: Never  Vaping Use   Vaping status: Never Used  Substance and Sexual Activity   Alcohol use: No   Drug use: No   Sexual activity: Yes    Birth control/protection: None  Other Topics Concern   Not on file  Social History Narrative   Not on file   Social Drivers of Health   Financial Resource Strain: High Risk (12/06/2023)   Overall Financial Resource Strain (CARDIA)    Difficulty of Paying Living Expenses:  Very hard  Food Insecurity: Patient Declined (12/06/2023)   Hunger Vital Sign    Worried About Running Out of Food in the Last Year: Patient declined    Ran Out of Food in the Last Year: Patient declined  Recent Concern: Food Insecurity - Food Insecurity Present (11/14/2023)   Hunger Vital Sign    Worried About Running Out of Food in the Last Year: Sometimes true    Ran Out of Food in the Last Year: Sometimes true  Transportation Needs: No Transportation Needs (11/14/2023)   PRAPARE - Administrator, Civil Service (Medical): No    Lack of Transportation (Non-Medical): No  Physical Activity: Not on file  Stress: Not on file  Social Connections: Not on file  Intimate Partner Violence: Not on file   Family History  Problem Relation Age of Onset   Colon polyps Mother 92   Breast cancer Mother 43   Hyperlipidemia Mother    Diabetes Father    Hypertension Father    Hyperlipidemia Sister    Hypertension Sister    Hyperlipidemia Brother    Hypertension Brother    Cancer Maternal Grandmother    Stroke Maternal Grandmother    Esophageal cancer Maternal Grandfather 82   Colon cancer Maternal Grandfather 72   Colon polyps Maternal Grandfather 72   Cancer Maternal Grandfather    Diabetes Maternal Grandfather    Heart disease Maternal Grandfather    Hypertension Maternal Grandfather    Stomach cancer Paternal Grandmother 32   Diabetes Paternal Grandmother    Hypertension Paternal Grandmother    Cancer Paternal Grandfather    Hypertension  Paternal Grandfather    Stroke Paternal Grandfather    Rectal cancer Neg Hx    BP 114/82   Pulse 90   Wt 103.9 kg (229 lb)   SpO2 97%   BMI 37.53 kg/m   Wt Readings from Last 3 Encounters:  01/19/24 103.9 kg (229 lb)  01/10/24 101.6 kg (224 lb)  12/20/23 103.4 kg (228 lb)   PHYSICAL EXAM: General:  Well appearing. No resp difficulty HEENT: normal Neck: supple. no JVD. Carotids 2+ bilat; no bruits. No lymphadenopathy or thryomegaly appreciated. Cor: PMI nondisplaced. Regular rate & rhythm. No rubs, gallops or murmurs. Lungs: clear Abdomen: soft, nondistended. Mildly tender under left breast No hepatosplenomegaly. No bruits or masses. Good bowel sounds. Extremities: no cyanosis, clubbing, rash, edema Neuro: alert & orientedx3, cranial nerves grossly intact. moves all 4 extremities w/o difficulty. Affect pleasant  ECG (personally reviewed): NSR 73 No ST-T wave abnormalities. Personally reviewed   ReDs reading: 35%, normal  ASSESSMENT & PLAN: 1. Chronic Systolic Heart Failure, NICM (likely hypertensive) with recovered EF - Echo 11/2019 EF 35-40% RV moderately reduced.  - Cath 11/2019 with minimal cardiac disease and preserved cardiac output. EF on cath 25%.  - Echo 05/11/20: EF 55% RV ok Personally reviewed - Echo 4/22: EF 55-60%, mild LVH, grade I DD, RV ok - Repeat echo pending - NYHA I-II Stable Volume ok  - Continue torsemide  20 mg PRN/20 KCL PRN - Continue carvedilol  6.25 mg bid.  - Continue Entresto  49/51 mg bid. - Continue spironolactone  25 mg daily. - Start Jardiance 10  - Labs today.  2. CAD - LHC 5/21: prox LAD 20% - Continue ASA 81 mg daily - Continue atorvastatin  80 mg daily. - Now with episodes of CP and SOB with exertion. Will get coronary CT to f/u   3. HTN - Blood pressure well controlled. Continue current regimen.  4. Obesity  - Body mass index is 37.53 kg/m. - Continue weight loss efforts.   5. OSA - Sleep study arranged by Pulm  6. DM II -  Uncontrolled  - previous A1c 15, most recent down to 8 - Followed by PCP. - Start Jardiance 10  Maria Harewood, MD  10:44 AM

## 2024-01-19 NOTE — Addendum Note (Signed)
 Encounter addended by: Alvan Fausto SAUNDERS, CMA on: 01/19/2024 11:16 AM  Actions taken: Clinical Note Signed

## 2024-01-23 ENCOUNTER — Ambulatory Visit: Payer: Self-pay

## 2024-01-23 ENCOUNTER — Telehealth: Payer: Self-pay | Admitting: Nurse Practitioner

## 2024-01-23 NOTE — Telephone Encounter (Signed)
 FYI Only or Action Required?: FYI only for provider.  Patient was last seen in primary care on 11/24/2023 by Oley Bascom RAMAN, NP. Called Nurse Triage reporting Cough. Symptoms began several days ago. Interventions attempted: OTC medications: Robitussin and drinking warm fluids  and Rest, hydration, or home remedies. Symptoms are: gradually worsening.  Triage Disposition: See Physician Within 24 Hours- no appts avail. RN advising patient to visit urgent or mobile health unit. Pt says she will go to [facility] tomorrow morning.   Patient/caregiver understands and will follow disposition?: Yes          Reason for Disposition  [1] Continuous (nonstop) coughing interferes with work or school AND [2] no improvement using cough treatment per Care Advice  Answer Assessment - Initial Assessment Questions 1. ONSET: When did the cough begin?      Started yesterday  2. SEVERITY: How bad is the cough today?      Getting worse  3. SPUTUM: Describe the color of your sputum (none, dry cough; clear, white, yellow, green)     Yellow-ish green  4. HEMOPTYSIS: Are you coughing up any blood? If so ask: How much? (flecks, streaks, tablespoons, etc.)     No  5. DIFFICULTY BREATHING: Are you having difficulty breathing? If Yes, ask: How bad is it? (e.g., mild, moderate, severe)    - MILD: No SOB at rest, mild SOB with walking, speaks normally in sentences, can lie down, no retractions, pulse < 100.    - MODERATE: SOB at rest, SOB with minimal exertion and prefers to sit, cannot lie down flat, speaks in phrases, mild retractions, audible wheezing, pulse 100-120.    - SEVERE: Very SOB at rest, speaks in single words, struggling to breathe, sitting hunched forward, retractions, pulse > 120      No, shortness of breath  6. FEVER: Do you have a fever? If Yes, ask: What is your temperature, how was it measured, and when did it start?     No  7. CARDIAC HISTORY: Do you have any  history of heart disease? (e.g., heart attack, congestive heart failure)      *No Answer* 8. LUNG HISTORY: Do you have any history of lung disease?  (e.g., pulmonary embolus, asthma, emphysema)     OSA  9. PE RISK FACTORS: Do you have a history of blood clots? (or: recent major surgery, recent prolonged travel, bedridden)     No  10. OTHER SYMPTOMS: Do you have any other symptoms? (e.g., runny nose, wheezing, chest pain)       Nasal congestion, unable to sleep with cpap  11. PREGNANCY: Is there any chance you are pregnant? When was your last menstrual period?       *No Answer* 12. TRAVEL: Have you traveled out of the country in the last month? (e.g., travel history, exposures)       *No Answer*  Protocols used: Cough - Acute Productive-A-AH

## 2024-01-23 NOTE — Telephone Encounter (Unsigned)
 Copied from CRM [id number]. Topic: Clinical - Medication Refill >> Jan 23, 2024  1:07 PM Charlet HERO wrote: Medication: amoxicillin  (AMOXIL ) 875 MG tablet  Has the patient contacted their pharmacy? Yes (No refills  This is the patient's preferred pharmacy:  [facility] [address], [address] [place] [address] Phone: [phone] Fax: [phone]   Is this the correct pharmacy for this prescription? Yes If no, delete pharmacy and type the correct one.   Has the prescription been filled recently? No  Is the patient out of the medication? Yes  Has the patient been seen for an appointment in the last year OR does the patient have an upcoming appointment? Yes  Can we respond through MyChart? Yes  Agent: Please be advised that Rx refills may take up to 3 business days. We ask that you follow-up with your pharmacy.

## 2024-01-24 ENCOUNTER — Ambulatory Visit

## 2024-01-25 NOTE — Progress Notes (Signed)
 Maria Duncan, I called and spoke with the pt and she states that she has not been using CPAP. She threw her machine from [employer] out bc it was not working. She says the light would not even turn on. She had the machine for approx 3 years. Can we order new machine?

## 2024-01-25 NOTE — Progress Notes (Signed)
 Can order new machine; may run into trouble with insurance if her machine was only 58 years old. Thanks.

## 2024-01-30 NOTE — Progress Notes (Signed)
 New CPAP order was placed.

## 2024-01-30 NOTE — Progress Notes (Signed)
Spoke with pt and notified of results per Katie.  Pt verbalized understanding and denied any questions. 

## 2024-02-02 ENCOUNTER — Ambulatory Visit (HOSPITAL_COMMUNITY)
Admission: RE | Admit: 2024-02-02 | Discharge: 2024-02-02 | Disposition: A | Discharge status: Home / Self Care | Source: Ambulatory Visit | Attending: Family Medicine | Admitting: Family Medicine

## 2024-02-02 DIAGNOSIS — E119 Type 2 diabetes mellitus without complications: Secondary | ICD-10-CM | POA: Insufficient documentation

## 2024-02-02 DIAGNOSIS — G4730 Sleep apnea, unspecified: Secondary | ICD-10-CM | POA: Insufficient documentation

## 2024-02-02 DIAGNOSIS — I2510 Atherosclerotic heart disease of native coronary artery without angina pectoris: Secondary | ICD-10-CM | POA: Insufficient documentation

## 2024-02-02 DIAGNOSIS — I429 Cardiomyopathy, unspecified: Secondary | ICD-10-CM | POA: Insufficient documentation

## 2024-02-02 DIAGNOSIS — R079 Chest pain, unspecified: Secondary | ICD-10-CM | POA: Diagnosis not present

## 2024-02-02 DIAGNOSIS — I517 Cardiomegaly: Secondary | ICD-10-CM | POA: Insufficient documentation

## 2024-02-02 DIAGNOSIS — I272 Pulmonary hypertension, unspecified: Secondary | ICD-10-CM | POA: Insufficient documentation

## 2024-02-02 DIAGNOSIS — I5042 Chronic combined systolic (congestive) and diastolic (congestive) heart failure: Secondary | ICD-10-CM | POA: Insufficient documentation

## 2024-02-02 LAB — ECHOCARDIOGRAM COMPLETE
AR max vel: 2.09 cm2
AV Peak grad: 11.7 mmHg
Ao pk vel: 1.71 m/s
Area-P 1/2: 4.15 cm2
Est EF: 50
S' Lateral: 2.8 cm

## 2024-02-02 NOTE — Progress Notes (Signed)
 Echocardiogram 2D Echocardiogram has been performed.  Maria Duncan 02/02/2024, 8:49 AM

## 2024-02-06 NOTE — Telephone Encounter (Signed)
 Copied from CRM [id number]. Topic: Clinical - Order For Equipment >> Feb 06, 2024 10:09 AM Rilla B wrote: Reason for CRM:  Patient calling to check the status of her CPAP order.  States she has not heard anything and she really needs the machine. Please call patient.  Order placed 7/8 is pending. Routing to [facility]. Please advise

## 2024-02-07 ENCOUNTER — Ambulatory Visit (INDEPENDENT_AMBULATORY_CARE_PROVIDER_SITE_OTHER)

## 2024-02-07 ENCOUNTER — Encounter (HOSPITAL_COMMUNITY): Payer: Self-pay

## 2024-02-07 ENCOUNTER — Ambulatory Visit (HOSPITAL_COMMUNITY)
Admission: EM | Admit: 2024-02-07 | Discharge: 2024-02-07 | Disposition: A | Discharge status: Home / Self Care | Attending: Internal Medicine | Admitting: Internal Medicine

## 2024-02-07 DIAGNOSIS — J22 Unspecified acute lower respiratory infection: Secondary | ICD-10-CM | POA: Diagnosis not present

## 2024-02-07 DIAGNOSIS — R051 Acute cough: Secondary | ICD-10-CM

## 2024-02-07 MED ORDER — METHYLPREDNISOLONE SODIUM SUCC 125 MG IJ SOLR
60.0000 mg | Freq: Once | INTRAMUSCULAR | Status: AC
  Administered 2024-02-07: 60 mg via INTRAMUSCULAR

## 2024-02-07 MED ORDER — AZITHROMYCIN 250 MG PO TABS: ORAL_TABLET | ORAL | 0 refills | Status: DC

## 2024-02-07 MED ORDER — METHYLPREDNISOLONE SODIUM SUCC 125 MG IJ SOLR
INTRAMUSCULAR | Status: AC
Start: 2024-02-07 — End: 2024-02-07
  Filled 2024-02-07: qty 2

## 2024-02-07 MED ORDER — BENZONATATE 100 MG PO CAPS
100.0000 mg | ORAL_CAPSULE | Freq: Three times a day (TID) | ORAL | 0 refills | Status: DC
  Filled 2024-02-08: qty 21, 7d supply, fill #0

## 2024-02-07 NOTE — ED Triage Notes (Signed)
 Patient states she is coughing up yellow mucous x 2 weeks. Denies any other symptoms.  Had one loose stools last week.

## 2024-02-07 NOTE — ED Provider Notes (Signed)
 [facility]    CSN: [id number] Arrival date & time: 02/07/24  1510      History   Chief Complaint No chief complaint on file.   HPI Maria Duncan is a 58 y.o. female.   58 year old female presents urgent care with complaints of productive cough, chest tightness and increased effort of breathing at night especially.  The patient reports that symptoms have been going on for at least 2 weeks.  The cough has definitely gotten worse and is more productive now.  She did have a fever last week.  She uses a CPAP at night and has not been able to secondary to coughing and choking on her mucus.  She reports that she has a sister who is sick as well and was recently diagnosed with pneumonia.  The patient has been using over-the-counter cough medication, Robitussin without relief.  She denies any nausea or vomiting.  She has had some loose stools.  She is able to eat and drink.  She reports that the mucus is yellow to green and has a bad taste.     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) (HCC)    Cataract    not a surgical candidate at this time (02/03/2023)   Chest pain of uncertain etiology, non obstructive CAD, pain due to acute HF 12/12/2019   CHF (congestive heart failure) (HCC)    Diabetes mellitus without complication (HCC)    Diabetic peripheral neuropathy (HCC)    bilateral feet   Heart murmur    childhood   Hyperlipidemia    on meds   Hypertension    on meds   Hypoventilation associated with obesity (HCC) 12/12/2019   Morbid obesity (HCC)    NICM (nonischemic cardiomyopathy) (HCC)    OSA (obstructive sleep apnea) 12/12/2019   uses CPAP   Pulmonary hypertension, unspecified (HCC) 12/12/2019    Patient Active Problem List   Diagnosis Date Noted   Chest pain 12/14/2023   Colon cancer screening 12/14/2022   Chronic respiratory failure with hypoxia (HCC) 09/21/2022   Type 2 diabetes mellitus with  hyperglycemia (HCC) 03/25/2022   Vitamin D  deficiency 08/27/2020   Hyperglycemia 08/07/2020   Pneumonia due to COVID-19 virus 07/28/2020   Acute hypoxemic respiratory failure due to COVID-19 (HCC) 07/27/2020   Combined congestive systolic and diastolic heart failure (HCC) 07/27/2020   Acute on chronic combined systolic and diastolic CHF (congestive heart failure) (HCC) 01/24/2020   Chest pain of uncertain etiology, non obstructive CAD, pain due to acute HF 12/12/2019   Pulmonary hypertension, unspecified (HCC) 12/12/2019   OSA (obstructive sleep apnea) 12/12/2019   Hypoventilation associated with obesity (HCC) 12/12/2019   CAD in native artery non obstructive on cath 12/09/19 12/12/2019   NICM (nonischemic cardiomyopathy) (HCC)    Acute combined systolic and diastolic HF (heart failure) (HCC)    Morbid obesity (HCC)    CHF exacerbation (HCC) 12/07/2019   Cardiomegaly 12/07/2019   Essential hypertension 12/07/2019   CHF (congestive heart failure) (HCC) 12/07/2019    Past Surgical History:  Procedure Laterality Date   BREAST BIOPSY Left    per pt benign   CESAREAN SECTION  1986   PARTIAL HYSTERECTOMY     RIGHT/LEFT HEART CATH AND CORONARY ANGIOGRAPHY N/A 12/09/2019   Procedure: RIGHT/LEFT HEART CATH AND CORONARY ANGIOGRAPHY;  Surgeon: Cherrie Toribio SAUNDERS, MD;  Location: [facility];  Service: Cardiovascular;  Laterality: N/A;   UTERINE FIBROID SURGERY  WISDOM TOOTH EXTRACTION      OB History   No obstetric history on file.      Home Medications    Prior to Admission medications   Medication Sig Start Date End Date Taking? Authorizing Provider  atorvastatin  (LIPITOR) 80 MG tablet Take 1 tablet (80 mg total) by mouth daily. 12/20/23  Yes Milford, Harlene HERO, FNP  azithromycin  (ZITHROMAX ) 250 MG tablet Take first 2 tablets together, then 1 every day until finished. 02/07/24  Yes Niccole Witthuhn A, PA-C  benzonatate  (TESSALON ) 100 MG capsule Take 1 capsule (100 mg total)  by mouth every 8 (eight) hours. 02/07/24  Yes Colandra Ohanian A, PA-C  carvedilol  (COREG ) 6.25 MG tablet Take 1 tablet (6.25 mg total) by mouth 2 (two) times daily. 12/20/23  Yes Milford, Harlene HERO, FNP  Chlorphen-PE-Acetaminophen  (NOREL AD) 4-10-325 MG TABS Take 1 tablet by mouth every 4 - 6 hours as needed for symptoms. NO MORE THAN 6 TABLETS WITHIN 24 HOURS. 11/20/23  Yes   docusate sodium  (COLACE) 100 MG capsule Take 1 capsule (100 mg total) by mouth 2 (two) times daily. 12/12/19  Yes Marylu Leita SAUNDERS, NP  Dulaglutide  (TRULICITY ) 1.5 MG/0.5ML SOAJ Inject 1.5 mg into the skin once a week. 09/25/23  Yes Oley Bascom RAMAN, NP  Dulaglutide  (TRULICITY ) 3 MG/0.5ML SOAJ Inject 3 mg into the skin once a week. 11/14/23  Yes Oley Bascom RAMAN, NP  empagliflozin  (JARDIANCE ) 10 MG TABS tablet Take 1 tablet (10 mg total) by mouth daily before breakfast. 01/19/24  Yes Bensimhon, Toribio SAUNDERS, MD  gabapentin  (NEURONTIN ) 100 MG capsule Take 1 capsule (100 mg total) by mouth 3 (three) times daily. 04/21/23  Yes Oley Bascom RAMAN, NP  glucose blood (TRUE METRIX BLOOD GLUCOSE TEST) test strip Use as instructed 10/15/21  Yes King, Camelia HERO, NP  Insulin  Pen Needle (TRUEPLUS 5-BEVEL PEN NEEDLES) 31G X 5 MM MISC Use daily at 2 PM. 10/14/22  Yes Nichols, Tonya S, NP  latanoprost  (XALATAN ) 0.005 % ophthalmic solution Place 1 drop into both eyes at bedtime. 11/29/23  Yes   polyethylene glycol (MIRALAX  / GLYCOLAX ) 17 g packet Take 17 g by mouth daily as needed (constipation). 12/12/19  Yes Marylu Leita SAUNDERS, NP  potassium chloride  SA (KLOR-CON  M) 20 MEQ tablet Take 1 tablet (20 mEq total) by mouth daily as needed. Take 1 tablet ( ) with torsemide  if needed 12/20/23  Yes Milford, Harlene HERO, FNP  sacubitril -valsartan  (ENTRESTO ) 49-51 MG Take 1 tablet by mouth 2 (two) times daily. 12/20/23  Yes Milford, Harlene HERO, FNP  spironolactone  (ALDACTONE ) 25 MG tablet Take 1 tablet (25 mg total) by mouth daily. NEEDS FOLLOW UP FOR ANYMORE REFILLS 12/20/23   Yes Amenia, Harlene HERO, FNP  torsemide  (DEMADEX ) 20 MG tablet Take 1 tablet (20 mg total) by mouth daily as needed. Take 1 tablet (20mg ) daily as needed- please take 1 tablet (20meq) of potassium with this if needed 12/20/23  Yes Milford, Harlene HERO, FNP  TRUEplus Lancets 28G MISC Use as directed in the morning and at bedtime. 10/15/21  Yes Myrna Camelia HERO, NP  Vitamin D -Vitamin K (K2 PLUS D3 PO) Take 1 tablet by mouth daily.   Yes [provider]  aspirin  81 MG EC tablet Take 1 tablet (81 mg total) by mouth daily. 12/20/19   Fernande Elspeth BROCKS, MD  ciclopirox  (PENLAC ) 8 % solution Apply topically at bedtime. Apply over nail and surrounding skin. Apply daily over previous coat. After seven (7) days, may remove with alcohol  and continue cycle. 12/14/23   Gershon Donnice SAUNDERS, DPM  diclofenac  (VOLTAREN ) 75 MG EC tablet Take 1 tablet (75 mg total) by mouth 2 (two) times daily. 04/21/23   Standiford, Marsa FALCON, DPM  Multiple Vitamins-Minerals (ZINC  PO) Take 1 tablet by mouth daily at 6 (six) AM.    [provider]    Family History Family History  Problem Relation Age of Onset   Colon polyps Mother 68   Breast cancer Mother 22   Hyperlipidemia Mother    Diabetes Father    Hypertension Father    Hyperlipidemia Sister    Hypertension Sister    Hyperlipidemia Brother    Hypertension Brother    Cancer Maternal Grandmother    Stroke Maternal Grandmother    Esophageal cancer Maternal Grandfather 29   Colon cancer Maternal Grandfather 72   Colon polyps Maternal Grandfather 72   Cancer Maternal Grandfather    Diabetes Maternal Grandfather    Heart disease Maternal Grandfather    Hypertension Maternal Grandfather    Stomach cancer Paternal Grandmother 7   Diabetes Paternal Grandmother    Hypertension Paternal Grandmother    Cancer Paternal Grandfather    Hypertension Paternal Grandfather    Stroke Paternal Grandfather    Rectal cancer Neg Hx     Social History Social History    Tobacco Use   Smoking status: Never   Smokeless tobacco: Never  Vaping Use   Vaping status: Never Used  Substance Use Topics   Alcohol use: No   Drug use: No     Allergies   Hyzaar [losartan potassium-hctz]   Review of Systems Review of Systems  Constitutional:  Negative for chills and fever.  HENT:  Negative for ear pain and sore throat.   Eyes:  Negative for pain and visual disturbance.  Respiratory:  Positive for cough, shortness of breath and wheezing.   Cardiovascular:  Negative for chest pain and palpitations.  Gastrointestinal:  Negative for abdominal pain and vomiting.  Genitourinary:  Negative for dysuria and hematuria.  Musculoskeletal:  Negative for arthralgias and back pain.  Skin:  Negative for color change and rash.  Neurological:  Negative for seizures and syncope.  All other systems reviewed and are negative.    Physical Exam Triage Vital Signs ED Triage Vitals [02/07/24 1524]  Encounter Vitals Group     BP 103/69     Girls Systolic BP Percentile      Girls Diastolic BP Percentile      Boys Systolic BP Percentile      Boys Diastolic BP Percentile      Pulse Rate 93     Resp 18     Temp 98.2 F (36.8 C)     Temp Source Oral     SpO2 94 %     Weight      Height      Head Circumference      Peak Flow      Pain Score      Pain Loc      Pain Education      Exclude from Growth Chart    No data found.  Updated Vital Signs BP 103/69 (BP Location: Left Arm)   Pulse 93   Temp 98.2 F (36.8 C) (Oral)   Resp 18   SpO2 94%   Visual Acuity Right Eye Distance:   Left Eye Distance:   Bilateral Distance:    Right Eye Near:   Left Eye Near:    Bilateral Near:  Physical Exam Vitals and nursing note reviewed.  Constitutional:      General: She is not in acute distress.    Appearance: She is well-developed.  HENT:     Head: Normocephalic and atraumatic.  Eyes:     Conjunctiva/sclera: Conjunctivae normal.  Cardiovascular:     Rate  and Rhythm: Normal rate and regular rhythm.     Heart sounds: No murmur heard. Pulmonary:     Effort: Pulmonary effort is normal. No respiratory distress.     Breath sounds: Examination of the right-upper field reveals wheezing and rhonchi. Examination of the left-upper field reveals wheezing and rhonchi. Examination of the right-middle field reveals wheezing and rhonchi. Examination of the left-middle field reveals wheezing and rhonchi. Examination of the right-lower field reveals rhonchi. Examination of the left-lower field reveals rhonchi. Wheezing and rhonchi present. No decreased breath sounds.  Abdominal:     Palpations: Abdomen is soft.     Tenderness: There is no abdominal tenderness.  Musculoskeletal:        General: No swelling.     Cervical back: Neck supple.  Skin:    General: Skin is warm and dry.     Capillary Refill: Capillary refill takes less than 2 seconds.  Neurological:     Mental Status: She is alert.  Psychiatric:        Mood and Affect: Mood normal.      UC Treatments / Results  Labs (all labs ordered are listed, but only abnormal results are displayed) Labs Reviewed - No data to display  EKG   Radiology DG Chest 2 View Result Date: 02/07/2024 CLINICAL DATA:  Cough and wheezing EXAM: CHEST - 2 VIEW COMPARISON:  X-ray 11/12/2023 and older FINDINGS: Underinflation. No consolidation, pneumothorax or effusion. No edema. No consolidation. Stable fullness of the right lung hilum. Possible large pulmonary vessels. Stable on prior x-rays. Normal cardiopericardial silhouette. Air-fluid level along the stomach beneath the left hemidiaphragm. Degenerative changes of the spine. IMPRESSION: Chronic changes.  No acute cardiopulmonary disease. Electronically Signed   By: Ranell Bring M.D.   On: 02/07/2024 17:07    Procedures Procedures (including critical care time)  Medications Ordered in UC Medications  methylPREDNISolone  sodium succinate (SOLU-MEDROL ) 125 mg/2 mL  injection 60 mg (has no administration in time range)    Initial Impression / Assessment and Plan / UC Course  I have reviewed the triage vital signs and the nursing notes.  Pertinent labs & imaging results that were available during my care of the patient were reviewed by me and considered in my medical decision making (see chart for details).     Acute cough - Plan: DG Chest 2 View, DG Chest 2 View  Lower respiratory infection   Chest x-ray done today.  This showed chronic changes but did not show a definitive pneumonia.  Given the severity of symptoms, duration of symptoms and physical exam findings, we recommend treating this for a respiratory infection.  We will treat with the following: Medrol  injection given today. This is a steroid to help with inflammation in the airways.  Azithromycin  250mg  Take 2 tablets today and the 1 tablet daily for 4 more days. Benzonatate  (tessalon ) 100 mg every 8 hours as needed for cough.  Rest and stay hydrated.  Make sure to drink plenty of fluids. Return to urgent care or PCP if symptoms worsen or fail to resolve.    Final Clinical Impressions(s) / UC Diagnoses   Final diagnoses:  Acute cough  Lower respiratory  infection     Discharge Instructions      Chest x-ray done today.  This showed chronic changes but did not show a definitive pneumonia.  Given the severity of symptoms, duration of symptoms and physical exam findings, we recommend treating this for a respiratory infection.  We will treat with the following: Medrol  injection given today. This is a steroid to help with inflammation in the airways.  Azithromycin  250mg  Take 2 tablets today and the 1 tablet daily for 4 more days. Benzonatate  (tessalon ) 100 mg every 8 hours as needed for cough.  Rest and stay hydrated.  Make sure to drink plenty of fluids. Return to urgent care or PCP if symptoms worsen or fail to resolve.       ED Prescriptions     Medication Sig Dispense Auth.  Provider   azithromycin  (ZITHROMAX ) 250 MG tablet Take first 2 tablets together, then 1 every day until finished. 6 tablet Lexxie Winberg A, PA-C   benzonatate  (TESSALON ) 100 MG capsule Take 1 capsule (100 mg total) by mouth every 8 (eight) hours. 21 capsule Teresa Almarie LABOR, [place]      PDMP not reviewed this encounter.   Teresa Almarie LABOR, [place] 02/07/24 1740

## 2024-02-07 NOTE — Discharge Instructions (Addendum)
 Chest x-ray done today.  This showed chronic changes but did not show a definitive pneumonia.  Given the severity of symptoms, duration of symptoms and physical exam findings, we recommend treating this for a respiratory infection.  We will treat with the following: Medrol  injection given today. This is a steroid to help with inflammation in the airways.  Azithromycin  250mg  Take 2 tablets today and the 1 tablet daily for 4 more days. Benzonatate  (tessalon ) 100 mg every 8 hours as needed for cough.  Rest and stay hydrated.  Make sure to drink plenty of fluids. Return to urgent care or PCP if symptoms worsen or fail to resolve.

## 2024-02-08 ENCOUNTER — Other Ambulatory Visit: Payer: Self-pay

## 2024-02-08 NOTE — Progress Notes (Signed)
 I believe this was already signed. Nothing in my box

## 2024-02-09 ENCOUNTER — Telehealth (HOSPITAL_COMMUNITY): Payer: Self-pay

## 2024-02-09 DIAGNOSIS — I5042 Chronic combined systolic (congestive) and diastolic (congestive) heart failure: Secondary | ICD-10-CM

## 2024-02-09 NOTE — Telephone Encounter (Signed)
 Patient called into office requesting review of her Echo results from 02/02/24  Advised patient I would forward message to provider for review. Patient aware.

## 2024-02-15 ENCOUNTER — Ambulatory Visit: Admitting: Nurse Practitioner

## 2024-02-15 ENCOUNTER — Ambulatory Visit: Admitting: Podiatry

## 2024-02-15 ENCOUNTER — Telehealth: Payer: Self-pay | Admitting: Nurse Practitioner

## 2024-02-15 NOTE — Telephone Encounter (Signed)
 Pt came in for appt stating did not receive cpap from [employer]. [facility] and he had to do some digging to find what was going on. Pts account with [employer] since 2021. Arvella is doing some digging and bringing in [place] to figure out where exactly the hold up is. Said he would get in touch with me when he's heard anything. Will follow up 7/28 with Brad if I haven't heard from him before.

## 2024-02-15 NOTE — Telephone Encounter (Signed)
 PT presented to the front. She gave me a letter from her employer, [employer], to be filled out by Ms. Cobb. I gave her a copy and put the letter and form in Ms. Cob's box.

## 2024-02-15 NOTE — Telephone Encounter (Signed)
 Please advise on updates from [employer]

## 2024-02-15 NOTE — Telephone Encounter (Signed)
 PT also explained she has not gotten a CPAP yet. Brad at [employer] said he needed to look into it when [facility] called him. He needed to check her insurance because thet may make her keep the one she has but she does not have a machine. She had one in 2022 when she had Covid and was released from the hospital. They came and got it and that machine was  not thru insurance,it was thru charity because she did not have insurance then.

## 2024-02-16 ENCOUNTER — Encounter (HOSPITAL_COMMUNITY): Payer: Self-pay

## 2024-02-19 ENCOUNTER — Telehealth: Payer: Self-pay

## 2024-02-19 NOTE — Telephone Encounter (Signed)
 Spoke with pt and Arvella today regarding the pt's CPAP. She has been updated that the CPAP is in review with her insurance. Once review is complete we will know more on an advisement or ETA for her machine as stated from [employer].   Routing in clinical team for additional information on pt's oxygen. Could you please advise whether pt requires oxygen or not? She stated she no longer has her oxygen as well that she acquired back when she received the CPAP.   Pt stated that she had oxygen at the same time someone stopped to pick up the CPAP as well as the oxygen. She stated that she did not sign anything nor receive a copy of the form ordering the pick up. She does not have a date for when this was completed, but said that it was about 4-5 weeks ago. She is understanding with her CPAP and where the status is of obtaining it. We are in   After speaking with Brad at [employer], prior to the pt, he did state that is oxygen is needed to let them know and we will coordinate to get this completed correctly.

## 2024-02-19 NOTE — Telephone Encounter (Signed)
 Messaged Brad at [employer] to see if there was any updates

## 2024-02-19 NOTE — Telephone Encounter (Signed)
 Copied from CRM [id number]. Topic: General - Other >> Feb 16, 2024  3:23 PM Russell PARAS wrote: Reason for CRM:   Patient returning call from [place]. Reviewed chart and did not see where she was contacted by clinic.   CB# [phone]    I do not see any documentation in chart where patient was contacted by our clinic. Nfn

## 2024-02-21 ENCOUNTER — Encounter (HOSPITAL_BASED_OUTPATIENT_CLINIC_OR_DEPARTMENT_OTHER): Admitting: Pulmonary Disease

## 2024-02-21 ENCOUNTER — Ambulatory Visit (HOSPITAL_COMMUNITY)
Admission: RE | Admit: 2024-02-21 | Source: Ambulatory Visit | Attending: Internal Medicine | Admitting: Internal Medicine

## 2024-02-21 ENCOUNTER — Other Ambulatory Visit: Payer: Self-pay

## 2024-02-22 ENCOUNTER — Other Ambulatory Visit (HOSPITAL_COMMUNITY)

## 2024-02-22 NOTE — Telephone Encounter (Signed)
 Bensimhon, Toribio SAUNDERS, MD to Me     02/12/24 12:08 AM There is normal pumping function which is great but heart muscle looks thick which is likely due to her elevated blood pressure but we should get MRI just to be sure nothing else is going on.    Spoke with patient and advised her of the above, cardiac MRI ordered and sent to pre cert.   Patient also inquiring about Coronary CTA- this was cancelled due to no insurance authorization will forward to pre cert as well.

## 2024-02-23 ENCOUNTER — Ambulatory Visit (INDEPENDENT_AMBULATORY_CARE_PROVIDER_SITE_OTHER): Admitting: Podiatry

## 2024-02-23 VITALS — Ht 65.5 in | Wt 229.0 lb

## 2024-02-23 DIAGNOSIS — M76822 Posterior tibial tendinitis, left leg: Secondary | ICD-10-CM | POA: Diagnosis not present

## 2024-02-23 DIAGNOSIS — M79671 Pain in right foot: Secondary | ICD-10-CM

## 2024-02-23 DIAGNOSIS — M79672 Pain in left foot: Secondary | ICD-10-CM | POA: Diagnosis not present

## 2024-02-23 DIAGNOSIS — B351 Tinea unguium: Secondary | ICD-10-CM | POA: Diagnosis not present

## 2024-02-23 DIAGNOSIS — Q666 Other congenital valgus deformities of feet: Secondary | ICD-10-CM | POA: Diagnosis not present

## 2024-02-23 DIAGNOSIS — E1149 Type 2 diabetes mellitus with other diabetic neurological complication: Secondary | ICD-10-CM

## 2024-02-26 ENCOUNTER — Encounter: Payer: Self-pay | Admitting: Nurse Practitioner

## 2024-02-26 ENCOUNTER — Ambulatory Visit: Admitting: Nurse Practitioner

## 2024-02-26 ENCOUNTER — Telehealth: Payer: Self-pay | Admitting: Nurse Practitioner

## 2024-02-26 VITALS — BP 101/70 | HR 85 | Temp 98.1°F | Wt 225.4 lb

## 2024-02-26 DIAGNOSIS — E1149 Type 2 diabetes mellitus with other diabetic neurological complication: Secondary | ICD-10-CM | POA: Diagnosis not present

## 2024-02-26 DIAGNOSIS — Z1211 Encounter for screening for malignant neoplasm of colon: Secondary | ICD-10-CM | POA: Diagnosis not present

## 2024-02-26 DIAGNOSIS — G4733 Obstructive sleep apnea (adult) (pediatric): Secondary | ICD-10-CM

## 2024-02-26 LAB — POCT GLYCOSYLATED HEMOGLOBIN (HGB A1C): Hemoglobin A1C: 7.8 % — AB (ref 4.0–5.6)

## 2024-02-26 NOTE — Telephone Encounter (Signed)
 Copied from CRM [id number]. Topic: Clinical - Order For Equipment >> Feb 26, 2024  3:09 PM Rilla B wrote: Patient states she recently had a sleep study and she was told she could receive the same mask that they used during sleep study, however, she received a different mask AND she states the size is wrong. She received a large and she wears a small. She is suppose to sleep on it tonight and she cannot use the large mask. She took a copy prescription to the supplier and the order on the script is wrong.  Please call patient.

## 2024-02-26 NOTE — Progress Notes (Signed)
  Subjective:  Patient ID: Maria Duncan, female    DOB: Apr 26, 1966,  MRN: [id number]  Chief Complaint  Patient presents with   Foot Pain    RM 13 Patient is here for right foot pain. Patient insurance refused MRI until physical therapy is completed. Patient request nail trimming today.    History of Present Illness Maria Duncan is a 58 year old female with diabetes and neuropathy who presents with bilateral foot pain which has been ongoing for over a year.  She was last seen Nov 23, 2022  for the same issue with Dr. Magdalen.  She states that physical therapy is required prior to getting the MRI.  She states her nails are also thickened elongated she has difficulty trimming them and she had to have them trimmed today.  No open lesions.    Objective:    Physical Exam General: AAO x3, NAD  Dermatological: Nails are hypertrophic and a dystrophic nail discoloration but appear to be normal color in all about the same.  There is some hyperpigmentation to multiple toenails with no extension of surrounding skin.  No open lesions.  There is nails 1-5 bilaterally.  Vascular: Dorsalis Pedis artery and Posterior Tibial artery pedal pulses are 2/4 bilateral with immedate capillary fill time.  There is no pain with calf compression, swelling, warmth, erythema.   Neruologic: Sensation decreased with Semmes Weinstein monofilament.  Negative Tinel's sign.  Musculoskeletal: There is a decreased medial arch upon weightbearing bilaterally.  Majority tenderness is localized along the course of the posterior tibial, flexor tendons with the right side worse than left.  Not able to appreciate any area pinpoint tenderness.  Ankle, subtalar joint range of motion intact.  This is unchanged.  Results LABS HbA1c: 8%    Assessment:   1. Posterior tibial tendinitis, right   2. Pes planovalgus   3. Posterior tibial tendon dysfunction (PTTD) of left lower extremity   4. Type II diabetes mellitus with  neurological manifestations (HCC)   5. Chronic pain due to trauma      Plan:  Patient was evaluated and treated and all questions answered.  Assessment and Plan Assessment & Plan Diabetic neuropathy Chronic neuropathy with bilateral foot pain, more severe on the right. - Continue gabapentin  100 mg TID. - Encourage blood sugar control.  Foot pain Chronic bilateral foot pain, more severe on the right, likely multifactorial.  I do think she is having posterior tibial tendon dysfunction and given the longevity of her symptoms concern for possible partial tearing. - Manage with boot and brace as per flat foot plan. - Referral to physical therapy placed today.  Symptomatic onychomycosis -Sharply debrided nails x 10 without any complications or bleeding.  Return in about 4 weeks (around 03/22/2024) for ankle pain, follow up from PT.  Donnice JONELLE Fees DPM

## 2024-02-26 NOTE — Progress Notes (Signed)
 Subjective   Patient ID: Maria Duncan, female    DOB: 12-11-1965, 58 y.o.   MRN: [id number]  Chief Complaint  Patient presents with   Diabetes    Referring provider: Oley Bascom RAMAN, NP  [name] is a 58 y.o. 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)  (HCC) No date: Cataract     Comment:  not a surgical candidate at this time (02/03/2023) 12/12/2019: Chest pain of uncertain etiology, non obstructive CAD,  pain due to acute HF No date: CHF (congestive heart failure) (HCC) No date: Diabetes mellitus without complication (HCC) No date: Diabetic peripheral neuropathy (HCC)     Comment:  bilateral feet No date: Heart murmur     Comment:  childhood No date: Hyperlipidemia     Comment:  on meds No date: Hypertension     Comment:  on meds 12/12/2019: Hypoventilation associated with obesity (HCC) No date: Morbid obesity (HCC) No date: NICM (nonischemic cardiomyopathy) (HCC) 12/12/2019: OSA (obstructive sleep apnea)     Comment:  uses CPAP 12/12/2019: Pulmonary hypertension, unspecified (HCC)   HPI   Patient presents today for follow-up visit.  She states that she does need a referral for social worker for financial needs.  Overall she has been doing well since last visit here.  A1c is improved since last visit but still elevated 7.8 today.  Patient has recently started on Trulicity .  Handouts were given on diet and exercise. Have placed referral to podiatry for bilateral foot pain and optometry.   Denies f/c/s, n/v/d, hemoptysis, PND, leg swelling.  Patient is requesting a referral to GI for a colonoscopy.   Allergies  Allergen Reactions   Hyzaar [Losartan Potassium-Hctz] Nausea And Vomiting and Cough    Patient can take Entresto  now    Immunization History  Administered Date(s) Administered   Tdap 09/17/2020    Tobacco History: Social History   Tobacco Use  Smoking Status Never  Smokeless Tobacco  Never   Counseling given: Not Answered   Outpatient Encounter Medications as of 02/26/2024  Medication Sig   aspirin  81 MG EC tablet Take 1 tablet (81 mg total) by mouth daily.   atorvastatin  (LIPITOR) 80 MG tablet Take 1 tablet (80 mg total) by mouth daily.   azithromycin  (ZITHROMAX ) 250 MG tablet Take first 2 tablets together, then 1 every day until finished.   benzonatate  (TESSALON ) 100 MG capsule Take 1 capsule (100 mg total) by mouth every 8 (eight) hours.   carvedilol  (COREG ) 6.25 MG tablet Take 1 tablet (6.25 mg total) by mouth 2 (two) times daily.   Chlorphen-PE-Acetaminophen  (NOREL AD) 4-10-325 MG TABS Take 1 tablet by mouth every 4 - 6 hours as needed for symptoms. NO MORE THAN 6 TABLETS WITHIN 24 HOURS.   ciclopirox  (PENLAC ) 8 % solution Apply topically at bedtime. Apply over nail and surrounding skin. Apply daily over previous coat. After seven (7) days, may remove with alcohol and continue cycle.   diclofenac  (VOLTAREN ) 75 MG EC tablet Take 1 tablet (75 mg total) by mouth 2 (two) times daily.   docusate sodium  (COLACE) 100 MG capsule Take 1 capsule (100 mg total) by mouth 2 (two) times daily.   Dulaglutide  (TRULICITY ) 1.5 MG/0.5ML SOAJ Inject 1.5 mg into the skin once a week.   Dulaglutide  (TRULICITY ) 3 MG/0.5ML SOAJ Inject 3 mg into the skin once a week.   empagliflozin  (JARDIANCE ) 10 MG TABS tablet Take 1 tablet (10 mg total) by  mouth daily before breakfast.   gabapentin  (NEURONTIN ) 100 MG capsule Take 1 capsule (100 mg total) by mouth 3 (three) times daily.   glucose blood (TRUE METRIX BLOOD GLUCOSE TEST) test strip Use as instructed   Insulin  Pen Needle (TRUEPLUS 5-BEVEL PEN NEEDLES) 31G X 5 MM MISC Use daily at 2 PM.   latanoprost  (XALATAN ) 0.005 % ophthalmic solution Place 1 drop into both eyes at bedtime.   Multiple Vitamins-Minerals (ZINC  PO) Take 1 tablet by mouth daily at 6 (six) AM.   polyethylene glycol (MIRALAX  / GLYCOLAX ) 17 g packet Take 17 g by mouth daily as needed  (constipation).   potassium chloride  SA (KLOR-CON  M) 20 MEQ tablet Take 1 tablet (20 mEq total) by mouth daily as needed. Take 1 tablet ( ) with torsemide  if needed   sacubitril -valsartan  (ENTRESTO ) 49-51 MG Take 1 tablet by mouth 2 (two) times daily.   spironolactone  (ALDACTONE ) 25 MG tablet Take 1 tablet (25 mg total) by mouth daily. NEEDS FOLLOW UP FOR ANYMORE REFILLS   torsemide  (DEMADEX ) 20 MG tablet Take 1 tablet (20 mg total) by mouth daily as needed. Take 1 tablet (20mg ) daily as needed- please take 1 tablet (20meq) of potassium with this if needed   TRUEplus Lancets 28G MISC Use as directed in the morning and at bedtime.   Vitamin D -Vitamin K (K2 PLUS D3 PO) Take 1 tablet by mouth daily.   No facility-administered encounter medications on file as of 02/26/2024.    Review of Systems  Review of Systems  Constitutional: Negative.   HENT: Negative.    Cardiovascular: Negative.   Gastrointestinal: Negative.   Allergic/Immunologic: Negative.   Neurological: Negative.   Psychiatric/Behavioral: Negative.       Objective:   BP 101/70   Pulse 85   Temp 98.1 F (36.7 C) (Oral)   Wt 225 lb 6.4 oz (102.2 kg)   SpO2 98%   BMI 36.94 kg/m   Wt Readings from Last 5 Encounters:  02/26/24 225 lb 6.4 oz (102.2 kg)  02/23/24 229 lb (103.9 kg)  01/19/24 229 lb (103.9 kg)  01/10/24 224 lb (101.6 kg)  12/20/23 228 lb (103.4 kg)     Physical Exam Vitals and nursing note reviewed.  Constitutional:      General: She is not in acute distress.    Appearance: She is well-developed.  Cardiovascular:     Rate and Rhythm: Normal rate and regular rhythm.  Pulmonary:     Effort: Pulmonary effort is normal.     Breath sounds: Normal breath sounds.  Neurological:     Mental Status: She is alert and oriented to person, place, and time.       Assessment & Plan:   Screening for colon cancer  Type II diabetes mellitus with neurological manifestations (HCC) -     Microalbumin /  creatinine urine ratio -     POCT glycosylated hemoglobin (Hb A1C) -     AMB Referral [facility] Care Management  Colon cancer screening -     Ambulatory referral to Gastroenterology     Return in about 3 months (around 05/28/2024).   Bascom GORMAN Borer, NP 02/26/2024

## 2024-02-27 ENCOUNTER — Telehealth: Payer: Self-pay | Admitting: *Deleted

## 2024-02-27 LAB — MICROALBUMIN / CREATININE URINE RATIO
Creatinine, Urine: 90.7 mg/dL
Microalb/Creat Ratio: <3 mg/g{creat} (ref 0–29)
Microalbumin, Urine: <3 ug/mL

## 2024-02-27 NOTE — Telephone Encounter (Signed)
 I called and spoke with the pt  She states [employer] sent her the wrong mask for her CPAP  She wanted the one that was used at the sleep center the night of her titration study  It looks like she used a small F40 airfit mask  I have placed order for this as well as new filters per her request  Nothing further needed

## 2024-02-27 NOTE — Progress Notes (Signed)
 Complex Care Management Care Guide Note  02/27/2024 Name: BRIGID VANDEKAMP MRN: [id number] DOB: 08-17-1965  Damita JONETTA Curnow is a 58 y.o. year old female who is a primary care patient of Oley Bascom RAMAN, NP and is actively engaged with the care management team. I reached out to Damita JONETTA Salvatierra by phone today to assist with scheduling  with the Pharmacist.  Follow up plan: Face to Face appointment with complex care management team member scheduled for:  9/24/  Harlene Satterfield  [facility]  [facility], [facility] Guide  Direct Dial: [phone]  Fax [phone]

## 2024-02-28 ENCOUNTER — Telehealth: Payer: Self-pay

## 2024-02-28 ENCOUNTER — Ambulatory Visit: Payer: Self-pay | Admitting: Nurse Practitioner

## 2024-02-28 NOTE — Telephone Encounter (Addendum)
 Received CMN. Signed and faxed to [facility] at [phone]. Received fax confirmation. Form faxed to [employer] at [phone].

## 2024-03-05 NOTE — Telephone Encounter (Signed)
 The order was for CPAP only, no o2  I explained this to the pt and have scheduled f/u with Katie for 04/17/24 at 11:30 am  Nothing further needed

## 2024-03-06 ENCOUNTER — Ambulatory Visit (HOSPITAL_COMMUNITY)
Admission: RE | Admit: 2024-03-06 | Discharge: 2024-03-06 | Disposition: A | Discharge status: Home / Self Care | Source: Ambulatory Visit | Attending: Internal Medicine | Admitting: Internal Medicine

## 2024-03-06 DIAGNOSIS — I5042 Chronic combined systolic (congestive) and diastolic (congestive) heart failure: Secondary | ICD-10-CM | POA: Insufficient documentation

## 2024-03-06 DIAGNOSIS — I517 Cardiomegaly: Secondary | ICD-10-CM | POA: Insufficient documentation

## 2024-03-06 DIAGNOSIS — I2510 Atherosclerotic heart disease of native coronary artery without angina pectoris: Secondary | ICD-10-CM | POA: Diagnosis not present

## 2024-03-06 LAB — POCT I-STAT CREATININE: Creatinine, Ser: 0.9 mg/dL (ref 0.44–1.00)

## 2024-03-06 MED ORDER — IOHEXOL 350 MG/ML SOLN
100.0000 mL | Freq: Once | INTRAVENOUS | Status: AC | PRN
Start: 2024-03-06 — End: 2024-03-06
  Administered 2024-03-06 (×2): 100 mL via INTRAVENOUS

## 2024-03-06 MED ORDER — NITROGLYCERIN 0.4 MG SL SUBL
0.8000 mg | SUBLINGUAL_TABLET | Freq: Once | SUBLINGUAL | Status: AC
  Administered 2024-03-06 (×2): 0.8 mg via SUBLINGUAL

## 2024-03-11 ENCOUNTER — Other Ambulatory Visit: Payer: Self-pay

## 2024-03-11 NOTE — Telephone Encounter (Signed)
Patient has been scheduled for 9/18.

## 2024-03-15 ENCOUNTER — Other Ambulatory Visit: Payer: Self-pay

## 2024-03-20 ENCOUNTER — Other Ambulatory Visit: Payer: Self-pay

## 2024-03-21 ENCOUNTER — Ambulatory Visit: Admitting: Podiatry

## 2024-03-21 ENCOUNTER — Telehealth (HOSPITAL_COMMUNITY): Payer: Self-pay | Admitting: Cardiology

## 2024-03-21 NOTE — Telephone Encounter (Signed)
 PT LEFT VM ON TRIAGE REQUESTING CT RESULTS.

## 2024-03-22 NOTE — Telephone Encounter (Signed)
 Pt aware.

## 2024-03-26 ENCOUNTER — Ambulatory Visit: Attending: Podiatry

## 2024-03-26 DIAGNOSIS — M76822 Posterior tibial tendinitis, left leg: Secondary | ICD-10-CM | POA: Insufficient documentation

## 2024-03-26 DIAGNOSIS — M722 Plantar fascial fibromatosis: Secondary | ICD-10-CM | POA: Insufficient documentation

## 2024-03-26 DIAGNOSIS — M79671 Pain in right foot: Secondary | ICD-10-CM | POA: Insufficient documentation

## 2024-03-26 DIAGNOSIS — M79672 Pain in left foot: Secondary | ICD-10-CM | POA: Insufficient documentation

## 2024-03-26 DIAGNOSIS — R2689 Other abnormalities of gait and mobility: Secondary | ICD-10-CM | POA: Diagnosis present

## 2024-03-26 NOTE — Therapy (Signed)
 OUTPATIENT PHYSICAL THERAPY LOWER EXTREMITY EVALUATION   Patient Name: Maria Duncan MRN: [id number] DOB:11/04/1965, 58 y.o., female Today's Date: 03/26/2024  END OF SESSION:  PT End of Session - 03/26/24 1054     Visit Number 1    Date for PT Re-Evaluation 06/18/24    Authorization Type [place] Medicaid    PT Start Time 1055    PT Stop Time 1140    PT Time Calculation (min) 45 min          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) (HCC)    Cataract    not a surgical candidate at this time (02/03/2023)   Chest pain of uncertain etiology, non obstructive CAD, pain due to acute HF 12/12/2019   CHF (congestive heart failure) (HCC)    Diabetes mellitus without complication (HCC)    Diabetic peripheral neuropathy (HCC)    bilateral feet   Heart murmur    childhood   Hyperlipidemia    on meds   Hypertension    on meds   Hypoventilation associated with obesity (HCC) 12/12/2019   Morbid obesity (HCC)    NICM (nonischemic cardiomyopathy) (HCC)    OSA (obstructive sleep apnea) 12/12/2019   uses CPAP   Pulmonary hypertension, unspecified (HCC) 12/12/2019   Past Surgical History:  Procedure Laterality Date   BREAST BIOPSY Left    per pt benign   CESAREAN SECTION  1986   PARTIAL HYSTERECTOMY     RIGHT/LEFT HEART CATH AND CORONARY ANGIOGRAPHY N/A 12/09/2019   Procedure: RIGHT/LEFT HEART CATH AND CORONARY ANGIOGRAPHY;  Surgeon: Cherrie Toribio SAUNDERS, MD;  Location: [facility] INVASIVE [facility];  Service: Cardiovascular;  Laterality: N/A;   UTERINE FIBROID SURGERY     WISDOM TOOTH EXTRACTION     Patient Active Problem List   Diagnosis Date Noted   Chest pain 12/14/2023   Colon cancer screening 12/14/2022   Chronic respiratory failure with hypoxia (HCC) 09/21/2022   Type 2 diabetes mellitus with hyperglycemia (HCC) 03/25/2022   Vitamin D  deficiency 08/27/2020   Hyperglycemia 08/07/2020   Pneumonia due to COVID-19 virus 07/28/2020    Acute hypoxemic respiratory failure due to COVID-19 (HCC) 07/27/2020   Combined congestive systolic and diastolic heart failure (HCC) 07/27/2020   Acute on chronic combined systolic and diastolic CHF (congestive heart failure) (HCC) 01/24/2020   Chest pain of uncertain etiology, non obstructive CAD, pain due to acute HF 12/12/2019   Pulmonary hypertension, unspecified (HCC) 12/12/2019   OSA (obstructive sleep apnea) 12/12/2019   Hypoventilation associated with obesity (HCC) 12/12/2019   CAD in native artery non obstructive on cath 12/09/19 12/12/2019   NICM (nonischemic cardiomyopathy) (HCC)    Acute combined systolic and diastolic HF (heart failure) (HCC)    Morbid obesity (HCC)    CHF exacerbation (HCC) 12/07/2019   Cardiomegaly 12/07/2019   Essential hypertension 12/07/2019   CHF (congestive heart failure) (HCC) 12/07/2019    PCP: Bascom Borer  REFERRING PROVIDER: Donnice Fees  REFERRING DIAG:  [phone] (ICD-10-CM) - Posterior tibial tendon dysfunction (PTTD) of left lower extremity    THERAPY DIAG:  Bilateral foot pain  Plantar fasciitis, bilateral  Other abnormalities of gait and mobility  Rationale for Evaluation and Treatment: Rehabilitation  ONSET DATE: a couple years  SUBJECTIVE:   SUBJECTIVE STATEMENT: They say I have neuropathy and they said my arches are gone. It is bone on bone and hurts really bad. It hurts on the inside of the ankles. I  had a boot on the R foot and brace on the L foot for about 5 months, just took it off yesterday.   PERTINENT HISTORY: See above  PAIN:  Are you having pain? Yes: NPRS scale: 10/10 Pain location: both feet, ankles, back of calf sometimes Pain description: throbbing, needle like pinching in the ankle, constant  Aggravating factors: standing for long periods, walking at the park, stairs, shopping Relieving factors: soaking helps for a couple of hours, massage also helps for about 3 hrs    PRECAUTIONS: None  RED  FLAGS: None   WEIGHT BEARING RESTRICTIONS: No  FALLS:  Has patient fallen in last 6 months? 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  DIAGNOSTIC FINDINGS: X-rays were clear   COGNITION: Overall cognitive status: Within functional limits for tasks assessed     SENSATION: WFL   MUSCLE LENGTH: Tightness in HS and calves  POSTURE: rounded shoulders  PALPATION: There is a decreased medial arch upon weightbearing bilaterally. Majority tenderness is localized along the course of the posterior tibial, flexor tendons with the right side worse than left.   Not able to appreciate any area pinpoint tenderness. Ankle, subtalar joint range of motion intact.   LOWER EXTREMITY ROM: All WFL but painful with all motions in ankles    LOWER EXTREMITY MMT: 5/5 BLE   FUNCTIONAL TESTS:  5 times sit to stand: 17.41s Timed up and go (TUG): 13.94s  GAIT: Distance walked: in clinic distances Assistive device utilized: None Level of assistance: Modified independence Comments: antalgic gait, walks with a limp due to pain in feet                                                                                                                                TREATMENT DATE: 03/26/24- EVAL, HEP    PATIENT EDUCATION:  Education details: POC, HEP, foot anatomy, pain relief tools such as massage, stretching, etc Person educated: Patient and Spouse Education method: Medical illustrator Education comprehension: verbalized understanding, returned demonstration, and needs  further education  HOME EXERCISE PROGRAM: Access Code: [facility] URL: [email or web address] Date: 03/26/2024 Prepared by: Almetta Fam  Exercises - Gastroc Stretch on Wall  - 1 x daily - 7 x weekly - 2 reps - 30 hold - Heel Raises with Counter Support  - 1 x daily - 7 x weekly - 2 sets - 10 reps - Long Sitting Calf Stretch with Strap  - 1 x daily - 7 x weekly - 2 reps - 30 hold -  Standing Plantar Fascia Mobilization with Small Ball  - 1 x daily - 7 x weekly - Towel Scrunches  - 1 x daily - 7 x weekly - 3 sets - 10 reps - Seated Plantar Fascia Stretch  - 1 x daily - 7 x weekly - 2 reps - 30 hold  ASSESSMENT:  CLINICAL IMPRESSION: Patient is a 58 y.o. female who was seen today for physical therapy evaluation and treatment for chronic bilateral foot pain. Her pain is multifactorial and could be a combination of posterior tibial tendon dysfunction, plantar fasciitis, neuropathy, and pes planus. Her doctor has concern for possible partial tearing. Patient is very flat footed on both sides and has high levels of pain. Her pain increase with walking and standing activities. We reviewed techniques to help with gastroc and plantar fascia tightness and pain including stretches seated and standing, tennis ball massage, possibly getting a foot massager. With the few exercises we went over today, she seemed to have some relief which proves her to be a good candidate for PT. She will benefit from PT to address her bilateral foot pain to be able to attain her goals to be able to dance, walk at the park, and do a light jog again.   OBJECTIVE IMPAIRMENTS: Abnormal gait, decreased activity tolerance, decreased balance, difficulty walking, increased fascial restrictions, impaired flexibility, and pain.   ACTIVITY LIMITATIONS: bending, squatting, stairs, and locomotion level  PARTICIPATION LIMITATIONS: cleaning, laundry, driving, shopping, community activity, occupation, and yard work  PERSONAL  FACTORS: Age, Past/current experiences, and Time since onset of injury/illness/exacerbation are also affecting patient's functional outcome.   REHAB POTENTIAL: Good  CLINICAL DECISION MAKING: Stable/uncomplicated  EVALUATION COMPLEXITY: Low   GOALS: Goals reviewed with patient? Yes  SHORT TERM GOALS: Target date: 05/07/24  Patient will be independent with initial HEP. Baseline:  Goal status: INITIAL  2.  Patient will report at least 50% improvement in bilateral ankle/foot pain to improve QOL. (5/10) Baseline: 10/10  Goal status: INITIAL    LONG TERM GOALS: Target date: 06/18/24  Patient will be independent with advanced/ongoing HEP to improve outcomes and carryover.  Baseline:  Goal status: INITIAL  2.  Patient will report at least 75% improvement in bilateral ankle/foot pain to improve QOL. (2/10) Baseline: 10/10 at eval Goal status: INITIAL  3.  Patient will be able to ambulate 600' with normal gait pattern without increased foot/ankle pain to access community.  Baseline: antalgic gait Goal status: INITIAL  4. Patient will be able to ascend/descend stairs with 1 HR and reciprocal step pattern safely to access home and community.  Baseline: step to with rails Goal status: INITIAL  5.  Patient will be able to walk, dance, and do a light jog without pain. Baseline: stopped doing all of these d/t pain Goal status: INITIAL   PLAN:  PT FREQUENCY: 2x/week  PT DURATION: 12 weeks  PLANNED INTERVENTIONS: 97110-Therapeutic exercises, 97530- Therapeutic activity, [id number]- Neuromuscular re-education, 97535- Self Care, 02859- Manual therapy, [phone]- Gait training, [phone]- Electrical stimulation (unattended), [id number]- Ultrasound, [id number]- Ionotophoresis 4mg /ml Dexamethasone , 79439 (1-2 muscles), 20561 (3+ muscles)- Dry Needling, Patient/Family education, Balance training, Stair training, Taping, Joint mobilization, Cryotherapy, and Moist heat  PLAN FOR NEXT SESSION: STW to bilateral  PF, calf stretch, calf raises, can try taping    Almetta Fam, PT 03/26/2024, 11:52 AM

## 2024-03-27 ENCOUNTER — Other Ambulatory Visit: Payer: Self-pay

## 2024-03-29 ENCOUNTER — Encounter: Payer: Self-pay | Admitting: Physical Therapy

## 2024-03-29 ENCOUNTER — Ambulatory Visit: Admitting: Physical Therapy

## 2024-03-29 DIAGNOSIS — M722 Plantar fascial fibromatosis: Secondary | ICD-10-CM

## 2024-03-29 DIAGNOSIS — M79671 Pain in right foot: Secondary | ICD-10-CM | POA: Diagnosis not present

## 2024-03-29 DIAGNOSIS — R2689 Other abnormalities of gait and mobility: Secondary | ICD-10-CM

## 2024-03-29 NOTE — Therapy (Signed)
 OUTPATIENT PHYSICAL THERAPY LOWER EXTREMITY TREATMENT   Patient Name: POONAM WOEHRLE MRN: [id number] DOB:1966-04-22, 58 y.o., female Today's Date: 03/29/2024  END OF SESSION:  PT End of Session - 03/29/24 1100     Visit Number 2    Number of Visits 27    Date for PT Re-Evaluation 06/18/24    Authorization Type [place] [employer]    PT Start Time 1100    PT Stop Time 1145    PT Time Calculation (min) 45 min    Activity Tolerance Patient tolerated treatment well    Behavior During Therapy WFL for tasks assessed/performed          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) (HCC)    Cataract    not a surgical candidate at this time (02/03/2023)   Chest pain of uncertain etiology, non obstructive CAD, pain due to acute HF 12/12/2019   CHF (congestive heart failure) (HCC)    Diabetes mellitus without complication (HCC)    Diabetic peripheral neuropathy (HCC)    bilateral feet   Heart murmur    childhood   Hyperlipidemia    on meds   Hypertension    on meds   Hypoventilation associated with obesity (HCC) 12/12/2019   Morbid obesity (HCC)    NICM (nonischemic cardiomyopathy) (HCC)    OSA (obstructive sleep apnea) 12/12/2019   uses CPAP   Pulmonary hypertension, unspecified (HCC) 12/12/2019   Past Surgical History:  Procedure Laterality Date   BREAST BIOPSY Left    per pt benign   CESAREAN SECTION  1986   PARTIAL HYSTERECTOMY     RIGHT/LEFT HEART CATH AND CORONARY ANGIOGRAPHY N/A 12/09/2019   Procedure: RIGHT/LEFT HEART CATH AND CORONARY ANGIOGRAPHY;  Surgeon: Cherrie Toribio SAUNDERS, MD;  Location: [facility];  Service: Cardiovascular;  Laterality: N/A;   UTERINE FIBROID SURGERY     WISDOM TOOTH EXTRACTION     Patient Active Problem List   Diagnosis Date Noted   Chest pain 12/14/2023   Colon cancer screening 12/14/2022   Chronic respiratory failure with hypoxia (HCC) 09/21/2022   Type 2 diabetes mellitus with  hyperglycemia (HCC) 03/25/2022   Vitamin D  deficiency 08/27/2020   Hyperglycemia 08/07/2020   Pneumonia due to COVID-19 virus 07/28/2020   Acute hypoxemic respiratory failure due to COVID-19 (HCC) 07/27/2020   Combined congestive systolic and diastolic heart failure (HCC) 07/27/2020   Acute on chronic combined systolic and diastolic CHF (congestive heart failure) (HCC) 01/24/2020   Chest pain of uncertain etiology, non obstructive CAD, pain due to acute HF 12/12/2019   Pulmonary hypertension, unspecified (HCC) 12/12/2019   OSA (obstructive sleep apnea) 12/12/2019   Hypoventilation associated with obesity (HCC) 12/12/2019   CAD in native artery non obstructive on cath 12/09/19 12/12/2019   NICM (nonischemic cardiomyopathy) (HCC)    Acute combined systolic and diastolic HF (heart failure) (HCC)    Morbid obesity (HCC)    CHF exacerbation (HCC) 12/07/2019   Cardiomegaly 12/07/2019   Essential hypertension 12/07/2019   CHF (congestive heart failure) (HCC) 12/07/2019    PCP: Bascom Borer  REFERRING PROVIDER: Donnice Fees  REFERRING DIAG:  [phone] (ICD-10-CM) - Posterior tibial tendon dysfunction (PTTD) of left lower extremity    THERAPY DIAG:  Bilateral foot pain  Plantar fasciitis, bilateral  Other abnormalities of gait and mobility  Rationale for Evaluation and Treatment: Rehabilitation  ONSET DATE: a couple years  SUBJECTIVE:   SUBJECTIVE STATEMENT: REports that she really  is hurting and nothing in the past has helped.  They say I have neuropathy and they said my arches are gone. It is bone on bone and hurts really bad. It hurts on the inside of the ankles. I had a boot on the R foot and brace on the L foot for about 5 months, just took it off yesterday.   PERTINENT HISTORY: See above  PAIN:  Are you having pain? Yes: NPRS scale: 10/10 Pain location: both feet, ankles, back of calf sometimes Pain description: throbbing, needle like pinching in the ankle,  constant  Aggravating factors: standing for long periods, walking at the park, stairs, shopping Relieving factors: soaking helps for a couple of hours, massage also helps for about 3 hrs    PRECAUTIONS: None  RED FLAGS: None   WEIGHT BEARING RESTRICTIONS: No  FALLS:  Has patient fallen in last 6 months? 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  DIAGNOSTIC FINDINGS: X-rays were clear   COGNITION: Overall cognitive status: Within functional limits for tasks assessed     SENSATION: WFL   MUSCLE LENGTH: Tightness in HS and calves  POSTURE: rounded shoulders  PALPATION: There is a decreased medial arch upon weightbearing bilaterally. Majority tenderness is localized along the course of the posterior tibial, flexor tendons with the right side worse than left.   Not able to appreciate any area pinpoint tenderness. Ankle, subtalar joint range of motion intact.   LOWER EXTREMITY ROM: All WFL but painful with all motions in ankles    LOWER EXTREMITY MMT: 5/5 BLE   FUNCTIONAL TESTS:  5 times sit to stand: 17.41s Timed up and go (TUG): 13.94s  GAIT: Distance walked: in clinic distances Assistive device utilized: None Level of assistance: Modified independence Comments: antalgic gait, walks with a limp due to pain in feet                                                                                                                                TREATMENT DATE:  03/29/24 Nustep level 4 x 4 minutes Slant board stretch Discussion on orthotics and  where to go and what to look for Reviewed HEP STM to the bilateral feet, passive stretch, joint mobs of the mid foot Ktape one piece to support PF, one piece to support posterior tibialis and then one to support arch   03/26/24- EVAL, HEP    PATIENT EDUCATION:  Education details: POC, HEP, foot anatomy, pain relief tools such as massage, stretching, etc Person educated: Patient  and Spouse Education method: Explanation and Demonstration Education comprehension: verbalized understanding, returned demonstration, and needs further education  HOME EXERCISE PROGRAM: Access Code: [facility] URL: [email or web address] Date: 03/26/2024 Prepared by: Almetta Fam  Exercises - Gastroc Stretch on Wall  - 1 x daily - 7 x weekly - 2 reps - 30 hold - Heel Raises with Counter Support  - 1 x daily - 7 x weekly - 2 sets - 10 reps - Long Sitting Calf Stretch with Strap  - 1 x daily - 7 x weekly - 2 reps - 30 hold - Standing Plantar Fascia Mobilization with Small Ball  - 1 x daily - 7 x weekly - Towel Scrunches  - 1 x daily - 7 x weekly - 3 sets - 10 reps - Seated Plantar Fascia Stretch  - 1 x daily - 7 x weekly - 2 reps - 30 hold  ASSESSMENT:  CLINICAL IMPRESSION: Patient reports frustration of years of no help with feet pain, she reports that she feels good about the HEP.  I initiated a little motion and stretches, did STM, joint mobs, gave information about orthotics, and then used K-tape for support, could try leuko tape int he future if needed, just want to be careful with the skin as she is diabetic  Patient is a 58 y.o. female who was seen today for physical therapy evaluation and treatment for chronic bilateral foot pain. Her pain is multifactorial and could be a combination of posterior tibial tendon dysfunction, plantar fasciitis, neuropathy, and pes planus. Her doctor has concern for possible partial tearing. Patient is very flat footed on both sides and has high levels of pain. Her  pain increase with walking and standing activities. We reviewed techniques to help with gastroc and plantar fascia tightness and pain including stretches seated and standing, tennis ball massage, possibly getting a foot massager. With the few exercises we went over today, she seemed to have some relief which proves her to be a good candidate for PT. She will benefit from PT to address her bilateral foot pain to be able to attain her goals to be able to dance, walk at the park, and do a light jog again.   OBJECTIVE IMPAIRMENTS: Abnormal gait, decreased activity tolerance, decreased balance, difficulty walking, increased fascial restrictions, impaired flexibility, and pain.   ACTIVITY LIMITATIONS: bending, squatting, stairs, and locomotion level  PARTICIPATION LIMITATIONS: cleaning, laundry, driving, shopping, community activity, occupation, and yard work  PERSONAL FACTORS: Age, Past/current experiences, and Time since onset of injury/illness/exacerbation are also affecting patient's functional outcome.   REHAB POTENTIAL: Good  CLINICAL DECISION MAKING: Stable/uncomplicated  EVALUATION COMPLEXITY: Low   GOALS: Goals reviewed with patient? Yes  SHORT TERM GOALS: Target date: 05/07/24  Patient will be independent with initial HEP. Baseline:  Goal status: INITIAL  2.  Patient will report at least 50% improvement in bilateral ankle/foot pain to improve QOL. (5/10) Baseline: 10/10  Goal status: INITIAL    LONG TERM GOALS: Target date: 06/18/24  Patient will be independent with advanced/ongoing HEP to improve outcomes and carryover.  Baseline:  Goal status: INITIAL  2.  Patient will report at least 75% improvement in bilateral ankle/foot pain to improve QOL. (2/10) Baseline: 10/10 at eval Goal status: INITIAL  3.  Patient will be able to ambulate 600' with normal gait pattern without increased foot/ankle pain to access community.  Baseline: antalgic gait Goal status: INITIAL  4.  Patient will be able to ascend/descend stairs with 1 HR and reciprocal step  pattern safely to access home and community.  Baseline: step to with rails Goal status: INITIAL  5.  Patient will be able to walk, dance, and do a light jog without pain. Baseline: stopped doing all of these d/t pain Goal status: INITIAL   PLAN:  PT FREQUENCY: 2x/week  PT DURATION: 12 weeks  PLANNED INTERVENTIONS: 97110-Therapeutic exercises, 97530- Therapeutic activity, [id number]- Neuromuscular re-education, 97535- Self Care, 02859- Manual therapy, [phone]- Gait training, [phone]- Electrical stimulation (unattended), [id number]- Ultrasound, [id number]- Ionotophoresis 4mg /ml Dexamethasone , 79439 (1-2 muscles), 20561 (3+ muscles)- Dry Needling, Patient/Family education, Balance training, Stair training, Taping, Joint mobilization, Cryotherapy, and Moist heat  PLAN FOR NEXT SESSION: STW to bilateral PF, calf stretch, calf raises, can try taping    Ibrahem Volkman W, PT 03/29/2024, 11:01 AM

## 2024-04-02 ENCOUNTER — Ambulatory Visit: Admitting: Physical Therapy

## 2024-04-02 DIAGNOSIS — M79671 Pain in right foot: Secondary | ICD-10-CM | POA: Diagnosis not present

## 2024-04-02 DIAGNOSIS — M722 Plantar fascial fibromatosis: Secondary | ICD-10-CM

## 2024-04-02 NOTE — Therapy (Signed)
 OUTPATIENT PHYSICAL THERAPY LOWER EXTREMITY TREATMENT   Patient Name: Maria Duncan MRN: [id number] DOB:04/11/1966, 58 y.o., female Today's Date: 04/02/2024  END OF SESSION:  PT End of Session - 04/02/24 0929     Visit Number 3    Number of Visits 27    Date for PT Re-Evaluation 06/18/24    Authorization Type [place] Medicaid    PT Start Time 0930    PT Stop Time 1015    PT Time Calculation (min) 45 min          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) (HCC)    Cataract    not a surgical candidate at this time (02/03/2023)   Chest pain of uncertain etiology, non obstructive CAD, pain due to acute HF 12/12/2019   CHF (congestive heart failure) (HCC)    Diabetes mellitus without complication (HCC)    Diabetic peripheral neuropathy (HCC)    bilateral feet   Heart murmur    childhood   Hyperlipidemia    on meds   Hypertension    on meds   Hypoventilation associated with obesity (HCC) 12/12/2019   Morbid obesity (HCC)    NICM (nonischemic cardiomyopathy) (HCC)    OSA (obstructive sleep apnea) 12/12/2019   uses CPAP   Pulmonary hypertension, unspecified (HCC) 12/12/2019   Past Surgical History:  Procedure Laterality Date   BREAST BIOPSY Left    per pt benign   CESAREAN SECTION  1986   PARTIAL HYSTERECTOMY     RIGHT/LEFT HEART CATH AND CORONARY ANGIOGRAPHY N/A 12/09/2019   Procedure: RIGHT/LEFT HEART CATH AND CORONARY ANGIOGRAPHY;  Surgeon: Cherrie Toribio SAUNDERS, MD;  Location: [facility];  Service: Cardiovascular;  Laterality: N/A;   UTERINE FIBROID SURGERY     WISDOM TOOTH EXTRACTION     Patient Active Problem List   Diagnosis Date Noted   Chest pain 12/14/2023   Colon cancer screening 12/14/2022   Chronic respiratory failure with hypoxia (HCC) 09/21/2022   Type 2 diabetes mellitus with hyperglycemia (HCC) 03/25/2022   Vitamin D  deficiency 08/27/2020   Hyperglycemia 08/07/2020   Pneumonia due to  COVID-19 virus 07/28/2020   Acute hypoxemic respiratory failure due to COVID-19 (HCC) 07/27/2020   Combined congestive systolic and diastolic heart failure (HCC) 07/27/2020   Acute on chronic combined systolic and diastolic CHF (congestive heart failure) (HCC) 01/24/2020   Chest pain of uncertain etiology, non obstructive CAD, pain due to acute HF 12/12/2019   Pulmonary hypertension, unspecified (HCC) 12/12/2019   OSA (obstructive sleep apnea) 12/12/2019   Hypoventilation associated with obesity (HCC) 12/12/2019   CAD in native artery non obstructive on cath 12/09/19 12/12/2019   NICM (nonischemic cardiomyopathy) (HCC)    Acute combined systolic and diastolic HF (heart failure) (HCC)    Morbid obesity (HCC)    CHF exacerbation (HCC) 12/07/2019   Cardiomegaly 12/07/2019   Essential hypertension 12/07/2019   CHF (congestive heart failure) (HCC) 12/07/2019    PCP: Bascom Borer  REFERRING PROVIDER: Donnice Fees  REFERRING DIAG:  [phone] (ICD-10-CM) - Posterior tibial tendon dysfunction (PTTD) of left lower extremity    THERAPY DIAG:  Bilateral foot pain  Plantar fasciitis, bilateral  Rationale for Evaluation and Treatment: Rehabilitation  ONSET DATE: a couple years  SUBJECTIVE:   SUBJECTIVE STATEMENT: Felt so good after last session, good lasting relief. Tape was wonderful. Doing HEP at home PERTINENT HISTORY: See above  PAIN:  Are you having pain? Yes: NPRS scale:  10/10 Pain location: both feet, ankles, back of calf sometimes Pain description: throbbing, needle like pinching in the ankle, constant  Aggravating factors: standing for long periods, walking at the park, stairs, shopping Relieving factors: soaking helps for a couple of hours, massage also helps for about 3 hrs    PRECAUTIONS: None  RED FLAGS: None   WEIGHT BEARING RESTRICTIONS: No  FALLS:  Has patient fallen in last 6 months? 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  DIAGNOSTIC FINDINGS: X-rays were clear   COGNITION: Overall cognitive status: Within functional limits for tasks assessed     SENSATION: WFL   MUSCLE LENGTH: Tightness in HS and calves  POSTURE: rounded shoulders  PALPATION: There is a decreased medial arch upon weightbearing bilaterally. Majority tenderness is localized along the course of the posterior tibial, flexor tendons with the right side worse than left.   Not able to appreciate any area pinpoint tenderness. Ankle, subtalar joint range of motion intact.   LOWER EXTREMITY ROM: All WFL but painful with all motions in ankles    LOWER EXTREMITY MMT: 5/5 BLE   FUNCTIONAL TESTS:  5 times sit to stand: 17.41s Timed up and go (TUG): 13.94s  GAIT: Distance walked: in clinic distances Assistive device utilized: None Level of assistance: Modified independence Comments: antalgic gait, walks with a limp due to pain in feet                                                                                                                                TREATMENT DATE:  04/02/24 STM to the bilateral feet, passive stretch, joint mobs of the mid foot and ankle Ktape one piece to support PF, one piece to support posterior tibialis and then one to support arch   03/29/24 Nustep level 4 x 4 minutes Slant board stretch Discussion on orthotics and where to go and what to look for Reviewed HEP STM to the bilateral feet, passive stretch, joint mobs of the mid foot Ktape one piece to support PF, one piece to support posterior tibialis and  then one to support arch   03/26/24- EVAL, HEP    PATIENT EDUCATION:  Education details: POC, HEP, foot anatomy, pain relief tools such as massage, stretching, etc Person educated: Patient and Spouse Education method: Medical illustrator Education comprehension: verbalized understanding, returned demonstration, and needs further education  HOME EXERCISE PROGRAM: Access Code: [facility] URL: [email or web address] Date: 03/26/2024 Prepared by: Almetta Fam  Exercises - Gastroc Stretch on Wall  - 1  x daily - 7 x weekly - 2 reps - 30 hold - Heel Raises with Counter Support  - 1 x daily - 7 x weekly - 2 sets - 10 reps - Long Sitting Calf Stretch with Strap  - 1 x daily - 7 x weekly - 2 reps - 30 hold - Standing Plantar Fascia Mobilization with Small Ball  - 1 x daily - 7 x weekly - Towel Scrunches  - 1 x daily - 7 x weekly - 3 sets - 10 reps - Seated Plantar Fascia Stretch  - 1 x daily - 7 x weekly - 2 reps - 30 hold  ASSESSMENT:  CLINICAL IMPRESSION: Pt arrived feeling much better after last session so did as above with educ on HEP and adding ABC as well as shoe and arch support educ    Patient is a 58 y.o. female who was seen today for physical therapy evaluation and treatment for chronic bilateral foot pain. Her pain is multifactorial and could be a combination of posterior tibial tendon dysfunction, plantar fasciitis, neuropathy, and pes planus. Her doctor has concern for possible partial tearing. Patient is very flat footed on both sides and has high levels of pain. Her pain increase with walking and standing activities. We reviewed techniques to help with gastroc and plantar fascia tightness and pain including stretches seated and standing, tennis ball massage, possibly getting a foot massager. With the few exercises we went over today, she seemed to have some relief which proves her to be a good candidate for PT. She will benefit from PT to address her bilateral  foot pain to be able to attain her goals to be able to dance, walk at the park, and do a light jog again.   OBJECTIVE IMPAIRMENTS: Abnormal gait, decreased activity tolerance, decreased balance, difficulty walking, increased fascial restrictions, impaired flexibility, and pain.   ACTIVITY LIMITATIONS: bending, squatting, stairs, and locomotion level  PARTICIPATION LIMITATIONS: cleaning, laundry, driving, shopping, community activity, occupation, and yard work  PERSONAL FACTORS: Age, Past/current experiences, and Time since onset of injury/illness/exacerbation are also affecting patient's functional outcome.   REHAB POTENTIAL: Good  CLINICAL DECISION MAKING: Stable/uncomplicated  EVALUATION COMPLEXITY: Low   GOALS: Goals reviewed with patient? Yes  SHORT TERM GOALS: Target date: 05/07/24  Patient will be independent with initial HEP. Baseline:  Goal status: 04/02/24 MET  2.  Patient will report at least 50% improvement in bilateral ankle/foot pain to improve QOL. (5/10) Baseline: 10/10  Goal status: INITIAL    LONG TERM GOALS: Target date: 06/18/24  Patient will be independent with advanced/ongoing HEP to improve outcomes and carryover.  Baseline:  Goal status: INITIAL  2.  Patient will report at least 75% improvement in bilateral ankle/foot pain to improve QOL. (2/10) Baseline: 10/10 at eval Goal status: INITIAL  3.  Patient will be able to ambulate 600' with normal gait pattern without increased foot/ankle pain to access community.  Baseline: antalgic gait Goal status: INITIAL  4. Patient will be able to ascend/descend stairs with 1 HR and reciprocal step pattern safely to access home and community.  Baseline: step to with rails Goal status: INITIAL  5.  Patient will be able to walk, dance, and do a light jog without pain. Baseline: stopped doing all of these d/t pain Goal status: INITIAL   PLAN:  PT FREQUENCY: 2x/week  PT DURATION: 12 weeks  PLANNED  INTERVENTIONS: 97110-Therapeutic exercises, 97530- Therapeutic activity, [id number]- Neuromuscular re-education, 97535- Self Care, 02859- Manual therapy, [id number]- Gait  training, [phone]- Electrical stimulation (unattended), [id number]- Ultrasound, 02966- Ionotophoresis 4mg /ml Dexamethasone , 79439 (1-2 muscles), 20561 (3+ muscles)- Dry Needling, Patient/Family education, Balance training, Stair training, Taping, Joint mobilization, Cryotherapy, and Moist heat  PLAN FOR NEXT SESSION: STW to bilateral PF, calf stretch, calf raises,    Felix Meras,ANGIE, PTA 04/02/2024, 10:14 AM

## 2024-04-04 ENCOUNTER — Ambulatory Visit: Admitting: Physical Therapy

## 2024-04-04 DIAGNOSIS — M722 Plantar fascial fibromatosis: Secondary | ICD-10-CM

## 2024-04-04 DIAGNOSIS — M79672 Pain in left foot: Secondary | ICD-10-CM

## 2024-04-04 DIAGNOSIS — M79671 Pain in right foot: Secondary | ICD-10-CM | POA: Diagnosis not present

## 2024-04-04 NOTE — Therapy (Signed)
 OUTPATIENT PHYSICAL THERAPY LOWER EXTREMITY TREATMENT   Patient Name: Maria Duncan MRN: [id number] DOB:16-Oct-1965, 58 y.o., female Today's Date: 04/04/2024  END OF SESSION:  PT End of Session - 04/04/24 0759     Visit Number 4    Number of Visits 27    Date for PT Re-Evaluation 06/18/24    Authorization Type [place] Medicaid    PT Start Time 0800    PT Stop Time 0840    PT Time Calculation (min) 40 min          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) (HCC)    Cataract    not a surgical candidate at this time (02/03/2023)   Chest pain of uncertain etiology, non obstructive CAD, pain due to acute HF 12/12/2019   CHF (congestive heart failure) (HCC)    Diabetes mellitus without complication (HCC)    Diabetic peripheral neuropathy (HCC)    bilateral feet   Heart murmur    childhood   Hyperlipidemia    on meds   Hypertension    on meds   Hypoventilation associated with obesity (HCC) 12/12/2019   Morbid obesity (HCC)    NICM (nonischemic cardiomyopathy) (HCC)    OSA (obstructive sleep apnea) 12/12/2019   uses CPAP   Pulmonary hypertension, unspecified (HCC) 12/12/2019   Past Surgical History:  Procedure Laterality Date   BREAST BIOPSY Left    per pt benign   CESAREAN SECTION  1986   PARTIAL HYSTERECTOMY     RIGHT/LEFT HEART CATH AND CORONARY ANGIOGRAPHY N/A 12/09/2019   Procedure: RIGHT/LEFT HEART CATH AND CORONARY ANGIOGRAPHY;  Surgeon: Cherrie Toribio SAUNDERS, MD;  Location: [facility];  Service: Cardiovascular;  Laterality: N/A;   UTERINE FIBROID SURGERY     WISDOM TOOTH EXTRACTION     Patient Active Problem List   Diagnosis Date Noted   Chest pain 12/14/2023   Colon cancer screening 12/14/2022   Chronic respiratory failure with hypoxia (HCC) 09/21/2022   Type 2 diabetes mellitus with hyperglycemia (HCC) 03/25/2022   Vitamin D  deficiency 08/27/2020   Hyperglycemia 08/07/2020   Pneumonia due to  COVID-19 virus 07/28/2020   Acute hypoxemic respiratory failure due to COVID-19 (HCC) 07/27/2020   Combined congestive systolic and diastolic heart failure (HCC) 07/27/2020   Acute on chronic combined systolic and diastolic CHF (congestive heart failure) (HCC) 01/24/2020   Chest pain of uncertain etiology, non obstructive CAD, pain due to acute HF 12/12/2019   Pulmonary hypertension, unspecified (HCC) 12/12/2019   OSA (obstructive sleep apnea) 12/12/2019   Hypoventilation associated with obesity (HCC) 12/12/2019   CAD in native artery non obstructive on cath 12/09/19 12/12/2019   NICM (nonischemic cardiomyopathy) (HCC)    Acute combined systolic and diastolic HF (heart failure) (HCC)    Morbid obesity (HCC)    CHF exacerbation (HCC) 12/07/2019   Cardiomegaly 12/07/2019   Essential hypertension 12/07/2019   CHF (congestive heart failure) (HCC) 12/07/2019    PCP: Bascom Borer  REFERRING PROVIDER: Donnice Fees  REFERRING DIAG:  [phone] (ICD-10-CM) - Posterior tibial tendon dysfunction (PTTD) of left lower extremity    THERAPY DIAG:  Bilateral foot pain  Plantar fasciitis, bilateral  Rationale for Evaluation and Treatment: Rehabilitation  ONSET DATE: a couple years  SUBJECTIVE:   SUBJECTIVE STATEMENT:  very pleased. Will try to get inserts this weekend PERTINENT HISTORY: See above  PAIN:  Are you having pain? Yes: NPRS scale: 10/10 Pain location: both feet, ankles,  back of calf sometimes Pain description: throbbing, needle like pinching in the ankle, constant  Aggravating factors: standing for long periods, walking at the park, stairs, shopping Relieving factors: soaking helps for a couple of hours, massage also helps for about 3 hrs    PRECAUTIONS: None  RED FLAGS: None   WEIGHT BEARING RESTRICTIONS: No  FALLS:  Has patient fallen in last 6 months? 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  DIAGNOSTIC FINDINGS: X-rays were clear   COGNITION: Overall cognitive status: Within functional limits for tasks assessed     SENSATION: WFL   MUSCLE LENGTH: Tightness in HS and calves  POSTURE: rounded shoulders  PALPATION: There is a decreased medial arch upon weightbearing bilaterally. Majority tenderness is localized along the course of the posterior tibial, flexor tendons with the right side worse than left.   Not able to appreciate any area pinpoint tenderness. Ankle, subtalar joint range of motion intact.   LOWER EXTREMITY ROM: All WFL but painful with all motions in ankles    LOWER EXTREMITY MMT: 5/5 BLE   FUNCTIONAL TESTS:  5 times sit to stand: 17.41s Timed up and go (TUG): 13.94s  GAIT: Distance walked: in clinic distances Assistive device utilized: None Level of assistance: Modified independence Comments: antalgic gait, walks with a limp due to pain in feet                                                                                                                                TREATMENT DATE:  04/04/24 STM ( with and without instrument assist)to the bilateral feet, passive stretch, joint mobs of the mid foot and ankle Ktape one piece to support PF, one piece to support posterior tibialis and then one to support arch  04/02/24 STM to the bilateral feet, passive stretch, joint mobs of the mid foot and ankle Ktape one piece to support PF, one piece to support posterior tibialis and then one to support arch   03/29/24 Nustep level 4 x 4 minutes Slant board stretch Discussion on orthotics and  where to go and what to look for Reviewed HEP STM to the bilateral feet, passive stretch, joint mobs of the mid foot Ktape one piece to support PF, one piece to support posterior tibialis and then one to support arch   03/26/24- EVAL, HEP    PATIENT EDUCATION:  Education details: POC, HEP, foot anatomy, pain relief tools such as massage, stretching, etc Person educated: Patient and Spouse Education method: Psychiatrist  comprehension: verbalized understanding, returned demonstration, and needs further education  HOME EXERCISE PROGRAM: Access Code: [facility] URL: [email or web address] Date: 03/26/2024 Prepared by: Almetta Fam  Exercises - Gastroc Stretch on Wall  - 1 x daily - 7 x weekly - 2 reps - 30 hold - Heel Raises with Counter Support  - 1 x daily - 7 x weekly - 2 sets - 10 reps - Long Sitting Calf Stretch with Strap  - 1 x daily - 7 x weekly - 2 reps - 30 hold - Standing Plantar Fascia Mobilization with Small Ball  - 1 x daily - 7 x weekly - Towel Scrunches  - 1 x daily - 7 x weekly - 3 sets - 10 reps - Seated Plantar Fascia Stretch  - 1 x daily - 7 x weekly - 2 reps - 30 hold  ASSESSMENT:  CLINICAL IMPRESSION: Pt amb in much better, lest stiffness . Pt very pleased and requested same try. We talked about and educ on DN and she will think about it.  Patient is a 58 y.o. female who was seen today for physical therapy evaluation and treatment for chronic bilateral foot pain. Her pain is multifactorial and could be a combination of posterior tibial tendon dysfunction, plantar fasciitis, neuropathy, and pes planus. Her doctor has concern for possible partial tearing. Patient is very flat footed on both sides and has high levels of pain. Her pain increase with walking and standing activities. We reviewed techniques to help with gastroc and plantar fascia tightness and pain including stretches seated and standing, tennis ball massage, possibly  getting a foot massager. With the few exercises we went over today, she seemed to have some relief which proves her to be a good candidate for PT. She will benefit from PT to address her bilateral foot pain to be able to attain her goals to be able to dance, walk at the park, and do a light jog again.   OBJECTIVE IMPAIRMENTS: Abnormal gait, decreased activity tolerance, decreased balance, difficulty walking, increased fascial restrictions, impaired flexibility, and pain.   ACTIVITY LIMITATIONS: bending, squatting, stairs, and locomotion level  PARTICIPATION LIMITATIONS: cleaning, laundry, driving, shopping, community activity, occupation, and yard work  PERSONAL FACTORS: Age, Past/current experiences, and Time since onset of injury/illness/exacerbation are also affecting patient's functional outcome.   REHAB POTENTIAL: Good  CLINICAL DECISION MAKING: Stable/uncomplicated  EVALUATION COMPLEXITY: Low   GOALS: Goals reviewed with patient? Yes  SHORT TERM GOALS: Target date: 05/07/24  Patient will be independent with initial HEP. Baseline:  Goal status: 04/02/24 MET  2.  Patient will report at least 50% improvement in bilateral ankle/foot pain to improve QOL. (5/10) Baseline: 10/10  Goal status: INITIAL    LONG TERM GOALS: Target date: 06/18/24  Patient will be independent with advanced/ongoing HEP to improve outcomes and carryover.  Baseline:  Goal status: INITIAL  2.  Patient will report at least 75% improvement in bilateral ankle/foot pain to improve QOL. (2/10) Baseline: 10/10 at eval Goal status: INITIAL  3.  Patient will be able to ambulate 600' with normal gait pattern without increased foot/ankle pain to access community.  Baseline: antalgic gait Goal status: INITIAL  4. Patient will be able to ascend/descend stairs with 1 HR and reciprocal step pattern safely to access home and community.  Baseline: step to with rails Goal status: INITIAL  5.  Patient will be able  to walk, dance, and do a light jog without pain. Baseline: stopped doing all of these d/t pain Goal  status: INITIAL   PLAN:  PT FREQUENCY: 2x/week  PT DURATION: 12 weeks  PLANNED INTERVENTIONS: 97110-Therapeutic exercises, 97530- Therapeutic activity, [id number]- Neuromuscular re-education, 97535- Self Care, 02859- Manual therapy, [phone]- Gait training, [phone]- Electrical stimulation (unattended), 97035- Ultrasound, [id number]- Ionotophoresis 4mg /ml Dexamethasone , 79439 (1-2 muscles), 20561 (3+ muscles)- Dry Needling, Patient/Family education, Balance training, Stair training, Taping, Joint mobilization, Cryotherapy, and Moist heat  PLAN FOR NEXT SESSION: STW to bilateral PF, calf stretch, calf raises,    Jordanny Waddington,ANGIE, PTA 04/04/2024, 8:36 AM

## 2024-04-07 ENCOUNTER — Ambulatory Visit (HOSPITAL_COMMUNITY): Payer: Self-pay | Admitting: Internal Medicine

## 2024-04-08 ENCOUNTER — Other Ambulatory Visit: Payer: Self-pay

## 2024-04-09 ENCOUNTER — Encounter (HOSPITAL_COMMUNITY): Payer: Self-pay

## 2024-04-09 ENCOUNTER — Ambulatory Visit: Admitting: Physical Therapy

## 2024-04-09 ENCOUNTER — Encounter: Payer: Self-pay | Admitting: Physical Therapy

## 2024-04-09 DIAGNOSIS — R2689 Other abnormalities of gait and mobility: Secondary | ICD-10-CM

## 2024-04-09 DIAGNOSIS — M79671 Pain in right foot: Secondary | ICD-10-CM

## 2024-04-09 DIAGNOSIS — M722 Plantar fascial fibromatosis: Secondary | ICD-10-CM
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
## 2024-04-09 NOTE — Therapy (Signed)
 OUTPATIENT PHYSICAL THERAPY LOWER EXTREMITY TREATMENT   Patient Name: Maria Duncan MRN: [id number] DOB:10/13/1965, 58 y.o., female Today's Date: 04/09/2024  END OF SESSION:  PT End of Session - 04/09/24 1612     Visit Number 5    Number of Visits 27    Date for PT Re-Evaluation 06/18/24    Authorization Type [place] Medicaid    PT Start Time 1612    PT Stop Time 1700    PT Time Calculation (min) 48 min    Activity Tolerance Patient tolerated treatment well    Behavior During Therapy WFL for tasks assessed/performed          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) (HCC)    Cataract    not a surgical candidate at this time (02/03/2023)   Chest pain of uncertain etiology, non obstructive CAD, pain due to acute HF 12/12/2019   CHF (congestive heart failure) (HCC)    Diabetes mellitus without complication (HCC)    Diabetic peripheral neuropathy (HCC)    bilateral feet   Heart murmur    childhood   Hyperlipidemia    on meds   Hypertension    on meds   Hypoventilation associated with obesity (HCC) 12/12/2019   Morbid obesity (HCC)    NICM (nonischemic cardiomyopathy) (HCC)    OSA (obstructive sleep apnea) 12/12/2019   uses CPAP   Pulmonary hypertension, unspecified (HCC) 12/12/2019   Past Surgical History:  Procedure Laterality Date   BREAST BIOPSY Left    per pt benign   CESAREAN SECTION  1986   PARTIAL HYSTERECTOMY     RIGHT/LEFT HEART CATH AND CORONARY ANGIOGRAPHY N/A 12/09/2019   Procedure: RIGHT/LEFT HEART CATH AND CORONARY ANGIOGRAPHY;  Surgeon: Cherrie Toribio SAUNDERS, MD;  Location: [facility];  Service: Cardiovascular;  Laterality: N/A;   UTERINE FIBROID SURGERY     WISDOM TOOTH EXTRACTION     Patient Active Problem List   Diagnosis Date Noted   Chest pain 12/14/2023   Colon cancer screening 12/14/2022   Chronic respiratory failure with hypoxia (HCC) 09/21/2022   Type 2 diabetes mellitus with  hyperglycemia (HCC) 03/25/2022   Vitamin D  deficiency 08/27/2020   Hyperglycemia 08/07/2020   Pneumonia due to COVID-19 virus 07/28/2020   Acute hypoxemic respiratory failure due to COVID-19 (HCC) 07/27/2020   Combined congestive systolic and diastolic heart failure (HCC) 07/27/2020   Acute on chronic combined systolic and diastolic CHF (congestive heart failure) (HCC) 01/24/2020   Chest pain of uncertain etiology, non obstructive CAD, pain due to acute HF 12/12/2019   Pulmonary hypertension, unspecified (HCC) 12/12/2019   OSA (obstructive sleep apnea) 12/12/2019   Hypoventilation associated with obesity (HCC) 12/12/2019   CAD in native artery non obstructive on cath 12/09/19 12/12/2019   NICM (nonischemic cardiomyopathy) (HCC)    Acute combined systolic and diastolic HF (heart failure) (HCC)    Morbid obesity (HCC)    CHF exacerbation (HCC) 12/07/2019   Cardiomegaly 12/07/2019   Essential hypertension 12/07/2019   CHF (congestive heart failure) (HCC) 12/07/2019    PCP: Bascom Borer  REFERRING PROVIDER: Donnice Fees  REFERRING DIAG:  [phone] (ICD-10-CM) - Posterior tibial tendon dysfunction (PTTD) of left lower extremity    THERAPY DIAG:  Bilateral foot pain  Plantar fasciitis, bilateral  Other abnormalities of gait and mobility  Rationale for Evaluation and Treatment: Rehabilitation  ONSET DATE: a couple years  SUBJECTIVE:   SUBJECTIVE STATEMENT:  I am really  doing much better, doing the exercises, went for a walk.  Pain a 6/10 PERTINENT HISTORY: See above  PAIN:  Are you having pain? Yes: NPRS scale: 10/10 Pain location: both feet, ankles, back of calf sometimes Pain description: throbbing, needle like pinching in the ankle, constant  Aggravating factors: standing for long periods, walking at the park, stairs, shopping Relieving factors: soaking helps for a couple of hours, massage also helps for about 3 hrs    PRECAUTIONS: None  RED  FLAGS: None   WEIGHT BEARING RESTRICTIONS: No  FALLS:  Has patient fallen in last 6 months? 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  DIAGNOSTIC FINDINGS: X-rays were clear   COGNITION: Overall cognitive status: Within functional limits for tasks assessed     SENSATION: WFL   MUSCLE LENGTH: Tightness in HS and calves  POSTURE: rounded shoulders  PALPATION: There is a decreased medial arch upon weightbearing bilaterally. Majority tenderness is localized along the course of the posterior tibial, flexor tendons with the right side worse than left.   Not able to appreciate any area pinpoint tenderness. Ankle, subtalar joint range of motion intact.   LOWER EXTREMITY ROM: All WFL but painful with all motions in ankles    LOWER EXTREMITY MMT: 5/5 BLE   FUNCTIONAL TESTS:  5 times sit to stand: 17.41s Timed up and go (TUG): 13.94s  GAIT: Distance walked: in clinic distances Assistive device utilized: None Level of assistance: Modified independence Comments: antalgic gait, walks with a limp due to pain in feet                                                                                                                                TREATMENT DATE: 04/09/24 Nustep level 4 x 6 minutes Slant board calf stretch On airex standing and weight shifts On airex cone toe touches Reviewed HEP STM to the PF bilaterally Joint mobs mid foot and phalanges Ktape 3 I strips, PF, posterior tib and arch   04/04/24 STM ( with and without instrument assist)to the  bilateral feet, passive stretch, joint mobs of the mid foot and ankle Ktape one piece to support PF, one piece to support posterior tibialis and then one to support arch  04/02/24 STM to the bilateral feet, passive stretch, joint mobs of the mid foot and ankle Ktape one piece to support PF, one piece to support posterior tibialis and then one to support arch   03/29/24 Nustep level 4 x 4 minutes Slant board stretch Discussion on  orthotics and where to go and what to look for Reviewed HEP STM to the bilateral feet, passive stretch, joint mobs of the mid foot Ktape one piece to support PF, one piece to support posterior tibialis and then one to support arch   03/26/24- EVAL, HEP    PATIENT EDUCATION:  Education details: POC, HEP, foot anatomy, pain relief tools such as massage, stretching, etc Person educated: Patient and Spouse Education method: Medical illustrator Education comprehension: verbalized understanding, returned demonstration, and needs further education  HOME EXERCISE PROGRAM: Access Code: [facility] URL: [email or web address] Date: 03/26/2024 Prepared by: Almetta Fam  Exercises - Gastroc Stretch on Wall  - 1 x daily - 7 x weekly - 2 reps - 30 hold - Heel Raises with Counter Support  - 1 x daily - 7 x weekly - 2 sets - 10 reps - Long Sitting Calf Stretch with Strap  - 1 x daily - 7 x weekly - 2 reps - 30 hold - Standing Plantar Fascia Mobilization with Small Ball  - 1 x daily - 7 x weekly - Towel Scrunches  - 1 x daily - 7 x weekly - 3 sets - 10 reps - Seated Plantar Fascia Stretch  - 1 x daily - 7 x weekly - 2 reps - 30 hold  ASSESSMENT:  CLINICAL IMPRESSION: Pt amb in much better, minimal limp, pain is better down from a 10 to a 6/10.  She reports that she is doing the exercises and had no questions.  I did some dynamic surface standing to continue to activate the intrinsic mms of the feet.  She does report going for a walk today for the first  time in 2 years  Patient is a 58 y.o. female who was seen today for physical therapy evaluation and treatment for chronic bilateral foot pain. Her pain is multifactorial and could be a combination of posterior tibial tendon dysfunction, plantar fasciitis, neuropathy, and pes planus. Her doctor has concern for possible partial tearing. Patient is very flat footed on both sides and has high levels of pain. Her pain increase with walking and standing activities. We reviewed techniques to help with gastroc and plantar fascia tightness and pain including stretches seated and standing, tennis ball massage, possibly getting a foot massager. With the few exercises we went over today, she seemed to have some relief which proves her to be a good candidate for PT. She will benefit from PT to address her bilateral foot pain to be able to attain her goals to be able to dance, walk at the park, and do a light jog again.   OBJECTIVE IMPAIRMENTS: Abnormal gait, decreased activity tolerance, decreased balance, difficulty walking, increased fascial restrictions, impaired flexibility, and pain.   ACTIVITY LIMITATIONS: bending, squatting, stairs, and locomotion level  PARTICIPATION LIMITATIONS: cleaning, laundry, driving, shopping, community activity, occupation, and yard work  PERSONAL FACTORS: Age, Past/current experiences, and Time since onset of injury/illness/exacerbation are also affecting patient's functional outcome.   REHAB POTENTIAL: Good  CLINICAL DECISION MAKING: Stable/uncomplicated  EVALUATION COMPLEXITY: Low   GOALS: Goals reviewed with patient? Yes  SHORT TERM GOALS: Target date: 05/07/24  Patient will be independent with initial HEP. Baseline:  Goal status: 04/02/24 MET  2.  Patient will report at least 50% improvement in bilateral ankle/foot pain to improve QOL. (5/10) Baseline: 10/10  Goal status: ongoing 04/09/24    LONG TERM GOALS: Target date: 06/18/24  Patient will be independent  with advanced/ongoing HEP to improve outcomes and carryover.  Baseline:  Goal status: INITIAL  2.  Patient will report at least 75% improvement in bilateral ankle/foot pain to improve QOL. (2/10) Baseline: 10/10 at eval Goal status: INITIAL  3.  Patient will be able to ambulate 600' with normal gait pattern without increased foot/ankle pain to access community.  Baseline: antalgic gait Goal status: INITIAL  4. Patient will be able to ascend/descend stairs with 1 HR and reciprocal step pattern safely to access home and community.  Baseline: step to with rails Goal status: INITIAL  5.  Patient will be able to walk, dance, and do a light jog without pain. Baseline: stopped doing all of these d/t pain Goal status: INITIAL   PLAN:  PT FREQUENCY: 2x/week  PT DURATION: 12 weeks  PLANNED INTERVENTIONS: 97110-Therapeutic exercises, 97530- Therapeutic activity, 97112- Neuromuscular re-education, 97535- Self Care, 02859- Manual therapy, [phone]- Gait training, [phone]- Electrical stimulation (unattended), [id number]- Ultrasound, [id number]- Ionotophoresis 4mg /ml Dexamethasone , 79439 (1-2 muscles), 20561 (3+ muscles)- Dry Needling, Patient/Family education, Balance training, Stair training, Taping, Joint mobilization, Cryotherapy, and Moist heat  PLAN FOR NEXT SESSION: STW to bilateral PF, calf stretch, calf raises,    Emmani Lesueur W, PT 04/09/2024, 4:12 PM

## 2024-04-10 ENCOUNTER — Ambulatory Visit (INDEPENDENT_AMBULATORY_CARE_PROVIDER_SITE_OTHER): Payer: Self-pay | Admitting: Neurology

## 2024-04-10 ENCOUNTER — Encounter: Payer: Self-pay | Admitting: Neurology

## 2024-04-10 ENCOUNTER — Other Ambulatory Visit: Payer: Self-pay

## 2024-04-10 ENCOUNTER — Telehealth: Payer: Self-pay | Admitting: Neurology

## 2024-04-10 VITALS — BP 127/85 | HR 74 | Wt 223.0 lb

## 2024-04-10 DIAGNOSIS — M542 Cervicalgia: Secondary | ICD-10-CM

## 2024-04-10 DIAGNOSIS — R200 Anesthesia of skin: Secondary | ICD-10-CM

## 2024-04-10 DIAGNOSIS — E1142 Type 2 diabetes mellitus with diabetic polyneuropathy: Secondary | ICD-10-CM | POA: Diagnosis not present

## 2024-04-10 MED ORDER — WRIST SPLINT/COCK-UP/LEFT M MISC
0 refills | Status: AC
  Filled 2024-04-10 – 2024-04-30 (×2): qty 1, fill #0

## 2024-04-10 MED ORDER — WRIST SPLINT/COCK-UP/RIGHT M MISC
0 refills | Status: AC
  Filled 2024-04-10 – 2024-04-30 (×2): qty 1, fill #0

## 2024-04-10 NOTE — Telephone Encounter (Signed)
 Caller states the pharmacy has an issue with the medication Dr.Jaffe prescribed. They dont have the medication so it needs to be called in else where. Caller is standing by waiting on the call back. Did not leave medication name.

## 2024-04-10 NOTE — Patient Instructions (Signed)
 Increase gabapentin  to 1 pill three times daily.  If no improvement in nerve pain in 2 weeks, contact me Refer to physical therapy for neck pain at [facility] wrist splints at night If no improvement in arm numbness and neck pain in 3 months, contact me Follow up 6 months.

## 2024-04-11 ENCOUNTER — Encounter: Payer: Self-pay | Admitting: Gastroenterology

## 2024-04-11 ENCOUNTER — Ambulatory Visit (HOSPITAL_COMMUNITY)

## 2024-04-11 ENCOUNTER — Ambulatory Visit: Admitting: Physical Therapy

## 2024-04-11 DIAGNOSIS — M79671 Pain in right foot: Secondary | ICD-10-CM

## 2024-04-11 DIAGNOSIS — M722 Plantar fascial fibromatosis: Secondary | ICD-10-CM

## 2024-04-11 NOTE — Telephone Encounter (Signed)
 Per patient not medication it was for the wrist splints.  Cock up wrist splint for carpal tunnel symptoms can be bought in local drug stores or online. This should especially be worn at night while sleeping.      Sent to patient. She will go online to order.

## 2024-04-11 NOTE — Therapy (Signed)
 OUTPATIENT PHYSICAL THERAPY LOWER EXTREMITY TREATMENT   Patient Name: Maria Duncan MRN: [id number] DOB:Nov 28, 1965, 58 y.o., female Today's Date: 04/11/2024  END OF SESSION:  PT End of Session - 04/11/24 0847     Visit Number 6    Number of Visits 27    Date for PT Re-Evaluation 06/18/24    Authorization Type [place] Medicaid    PT Start Time 0845    PT Stop Time 0930    PT Time Calculation (min) 45 min          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) (HCC)    Cataract    not a surgical candidate at this time (02/03/2023)   Chest pain of uncertain etiology, non obstructive CAD, pain due to acute HF 12/12/2019   CHF (congestive heart failure) (HCC)    Diabetes mellitus without complication (HCC)    Diabetic peripheral neuropathy (HCC)    bilateral feet   Heart murmur    childhood   Hyperlipidemia    on meds   Hypertension    on meds   Hypoventilation associated with obesity (HCC) 12/12/2019   Morbid obesity (HCC)    NICM (nonischemic cardiomyopathy) (HCC)    OSA (obstructive sleep apnea) 12/12/2019   uses CPAP   Pulmonary hypertension, unspecified (HCC) 12/12/2019   Past Surgical History:  Procedure Laterality Date   BREAST BIOPSY Left    per pt benign   CESAREAN SECTION  1986   PARTIAL HYSTERECTOMY     RIGHT/LEFT HEART CATH AND CORONARY ANGIOGRAPHY N/A 12/09/2019   Procedure: RIGHT/LEFT HEART CATH AND CORONARY ANGIOGRAPHY;  Surgeon: Cherrie Toribio SAUNDERS, MD;  Location: [facility];  Service: Cardiovascular;  Laterality: N/A;   UTERINE FIBROID SURGERY     WISDOM TOOTH EXTRACTION     Patient Active Problem List   Diagnosis Date Noted   Chest pain 12/14/2023   Colon cancer screening 12/14/2022   Chronic respiratory failure with hypoxia (HCC) 09/21/2022   Type 2 diabetes mellitus with hyperglycemia (HCC) 03/25/2022   Vitamin D  deficiency 08/27/2020   Hyperglycemia 08/07/2020   Pneumonia due to  COVID-19 virus 07/28/2020   Acute hypoxemic respiratory failure due to COVID-19 (HCC) 07/27/2020   Combined congestive systolic and diastolic heart failure (HCC) 07/27/2020   Acute on chronic combined systolic and diastolic CHF (congestive heart failure) (HCC) 01/24/2020   Chest pain of uncertain etiology, non obstructive CAD, pain due to acute HF 12/12/2019   Pulmonary hypertension, unspecified (HCC) 12/12/2019   OSA (obstructive sleep apnea) 12/12/2019   Hypoventilation associated with obesity (HCC) 12/12/2019   CAD in native artery non obstructive on cath 12/09/19 12/12/2019   NICM (nonischemic cardiomyopathy) (HCC)    Acute combined systolic and diastolic HF (heart failure) (HCC)    Morbid obesity (HCC)    CHF exacerbation (HCC) 12/07/2019   Cardiomegaly 12/07/2019   Essential hypertension 12/07/2019   CHF (congestive heart failure) (HCC) 12/07/2019    PCP: Bascom Borer  REFERRING PROVIDER: Donnice Fees  REFERRING DIAG:  [phone] (ICD-10-CM) - Posterior tibial tendon dysfunction (PTTD) of left lower extremity    THERAPY DIAG:  Bilateral foot pain  Plantar fasciitis, bilateral  Rationale for Evaluation and Treatment: Rehabilitation  ONSET DATE: a couple years  SUBJECTIVE:   SUBJECTIVE STATEMENT:  overdid yesterday at [employer]. Tight/sire/stiff   PERTINENT HISTORY: See above  PAIN:  Are you having pain? Yes: NPRS scale: 8/10 Pain location: both feet, ankles, back of  calf sometimes Pain description: throbbing, needle like pinching in the ankle, constant  Aggravating factors: standing for long periods, walking at the park, stairs, shopping Relieving factors: soaking helps for a couple of hours, massage also helps for about 3 hrs    PRECAUTIONS: None  RED FLAGS: None   WEIGHT BEARING RESTRICTIONS: No  FALLS:  Has patient fallen in last 6 months? 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  DIAGNOSTIC FINDINGS: X-rays were clear   COGNITION: Overall cognitive status: Within functional limits for tasks assessed     SENSATION: WFL   MUSCLE LENGTH: Tightness in HS and calves  POSTURE: rounded shoulders  PALPATION: There is a decreased medial arch upon weightbearing bilaterally. Majority tenderness is localized along the course of the posterior tibial, flexor tendons with the right side worse than left.   Not able to appreciate any area pinpoint tenderness. Ankle, subtalar joint range of motion intact.   LOWER EXTREMITY ROM: All WFL but painful with all motions in ankles    LOWER EXTREMITY MMT: 5/5 BLE   FUNCTIONAL TESTS:  5 times sit to stand: 17.41s Timed up and go (TUG): 13.94s  GAIT: Distance walked: in clinic distances Assistive device utilized: None Level of assistance: Modified independence Comments: antalgic gait, walks with a limp due to pain in feet                                                                                                                                TREATMENT DATE: 04/11/24 L 4 Rocker board  Dyna disc standing WBAT Black bar DF and PF STM to the PF bilaterally Joint mobs mid foot and phalanges Ktape 3 I strips, PF, posterior tib and arch    04/09/24 Nustep level 4 x 6 minutes Slant board calf stretch On airex standing and weight shifts On airex cone toe touches Reviewed HEP STM to the PF bilaterally Joint mobs mid foot and phalanges Ktape 3 I strips, PF, posterior tib and arch   04/04/24 STM ( with and without instrument assist)to the bilateral  feet, passive stretch, joint mobs of the mid foot and ankle Ktape one piece to support PF, one piece to support posterior tibialis and then one to support arch  04/02/24 STM to the bilateral feet, passive stretch, joint mobs of the mid foot and ankle Ktape one piece to support PF, one piece to support posterior tibialis and then one to support arch   03/29/24 Nustep level 4 x 4 minutes Slant board  stretch Discussion on orthotics and where to go and what to look for Reviewed HEP STM to the bilateral feet, passive stretch, joint mobs of the mid foot Ktape one piece to support PF, one piece to support posterior tibialis and then one to support arch   03/26/24- EVAL, HEP    PATIENT EDUCATION:  Education details: POC, HEP, foot anatomy, pain relief tools such as massage, stretching, etc Person educated: Patient and Spouse Education method: Medical illustrator Education comprehension: verbalized understanding, returned demonstration, and needs further education  HOME EXERCISE PROGRAM: Access Code: [facility] URL: [email or web address] Date: 03/26/2024 Prepared by: Almetta Fam  Exercises - Gastroc Stretch on Wall  - 1 x daily - 7 x weekly - 2 reps - 30 hold - Heel Raises with Counter Support  - 1 x daily - 7 x weekly - 2 sets - 10 reps - Long Sitting Calf Stretch with Strap  - 1 x daily - 7 x weekly - 2 reps - 30 hold - Standing Plantar Fascia Mobilization with Small Ball  - 1 x daily - 7 x weekly - Towel Scrunches  - 1 x daily - 7 x weekly - 3 sets - 10 reps - Seated Plantar Fascia Stretch  - 1 x daily - 7 x weekly - 2 reps - 30 hold  ASSESSMENT:  CLINICAL IMPRESSION: amb in with less limp but c/o increased sore/tightness after overdoing at work yesterday. Progressed some light ex for ROM and strength. Continues to respond to STW and taping OBJECTIVE IMPAIRMENTS: Abnormal gait, decreased activity tolerance, decreased balance, difficulty walking, increased fascial  restrictions, impaired flexibility, and pain.   ACTIVITY LIMITATIONS: bending, squatting, stairs, and locomotion level  PARTICIPATION LIMITATIONS: cleaning, laundry, driving, shopping, community activity, occupation, and yard work  PERSONAL FACTORS: Age, Past/current experiences, and Time since onset of injury/illness/exacerbation are also affecting patient's functional outcome.   REHAB POTENTIAL: Good  CLINICAL DECISION MAKING: Stable/uncomplicated  EVALUATION COMPLEXITY: Low   GOALS: Goals reviewed with patient? Yes  SHORT TERM GOALS: Target date: 05/07/24  Patient will be independent with initial HEP. Baseline:  Goal status: 04/02/24 MET  2.  Patient will report at least 50% improvement in bilateral ankle/foot pain to improve QOL. (5/10) Baseline: 10/10  Goal status: ongoing 04/09/24    LONG TERM GOALS: Target date: 06/18/24  Patient will be independent with advanced/ongoing HEP to improve outcomes and carryover.  Baseline:  Goal status: INITIAL  2.  Patient will report at least 75% improvement in bilateral ankle/foot pain to improve QOL. (2/10) Baseline: 10/10 at eval Goal status: INITIAL  3.  Patient will be able to ambulate 600' with normal gait pattern without increased foot/ankle pain to access community.  Baseline: antalgic gait Goal status: INITIAL  4. Patient will be able to ascend/descend stairs with 1 HR and reciprocal step pattern safely to access home and community.  Baseline: step to with rails Goal status: INITIAL  5.  Patient will be able to walk, dance, and do a light jog without pain. Baseline: stopped doing all of these d/t pain Goal status: INITIAL   PLAN:  PT FREQUENCY: 2x/week  PT DURATION: 12 weeks  PLANNED INTERVENTIONS: 97110-Therapeutic exercises, 97530- Therapeutic activity, [id number]- Neuromuscular re-education, 97535- Self Care, 02859- Manual therapy, [phone]- Gait training, [phone]- Electrical stimulation (unattended), [id number]- Ultrasound,  02966- Ionotophoresis 4mg /ml Dexamethasone , 79439 (1-2 muscles), 20561 (3+ muscles)- Dry Needling, Patient/Family education, Balance training, Stair training, Taping, Joint mobilization, Cryotherapy, and Moist heat  PLAN FOR NEXT SESSION: STW to bilateral  PF, calf stretch, calf raises,    Ellanore Vanhook,ANGIE, PTA 04/11/2024, 8:48 AM

## 2024-04-16 ENCOUNTER — Telehealth: Payer: Self-pay | Admitting: Nurse Practitioner

## 2024-04-16 ENCOUNTER — Ambulatory Visit: Admitting: Physical Therapy

## 2024-04-16 DIAGNOSIS — M722 Plantar fascial fibromatosis: Secondary | ICD-10-CM

## 2024-04-16 DIAGNOSIS — M79671 Pain in right foot: Secondary | ICD-10-CM | POA: Diagnosis not present

## 2024-04-16 NOTE — Telephone Encounter (Signed)
 Copied from CRM [id number]. Topic: General - Other >> Apr 16, 2024  8:49 AM Maria Duncan wrote: Reason for CRM: Patient has an appointment scheduled for tomorrow with the pharmacist that she had to cancel//Please call to advised what the appointment is about because she was unaware

## 2024-04-16 NOTE — Therapy (Signed)
 OUTPATIENT PHYSICAL THERAPY LOWER EXTREMITY TREATMENT   Patient Name: Maria Duncan MRN: [id number] DOB:31-Mar-1966, 58 y.o., female Today's Date: 04/16/2024  END OF SESSION:  PT End of Session - 04/16/24 0810     Visit Number 7    Number of Visits 27    Date for Recertification  06/18/24    Authorization Type [place] Medicaid    PT Start Time 0800    PT Stop Time 0845    PT Time Calculation (min) 45 min          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) (HCC)    Cataract    not a surgical candidate at this time (02/03/2023)   Chest pain of uncertain etiology, non obstructive CAD, pain due to acute HF 12/12/2019   CHF (congestive heart failure) (HCC)    Diabetes mellitus without complication (HCC)    Diabetic peripheral neuropathy (HCC)    bilateral feet   Heart murmur    childhood   Hyperlipidemia    on meds   Hypertension    on meds   Hypoventilation associated with obesity (HCC) 12/12/2019   Morbid obesity (HCC)    NICM (nonischemic cardiomyopathy) (HCC)    OSA (obstructive sleep apnea) 12/12/2019   uses CPAP   Pulmonary hypertension, unspecified (HCC) 12/12/2019   Past Surgical History:  Procedure Laterality Date   BREAST BIOPSY Left    per pt benign   CESAREAN SECTION  1986   PARTIAL HYSTERECTOMY     RIGHT/LEFT HEART CATH AND CORONARY ANGIOGRAPHY N/A 12/09/2019   Procedure: RIGHT/LEFT HEART CATH AND CORONARY ANGIOGRAPHY;  Surgeon: Cherrie Toribio SAUNDERS, MD;  Location: [facility];  Service: Cardiovascular;  Laterality: N/A;   UTERINE FIBROID SURGERY     WISDOM TOOTH EXTRACTION     Patient Active Problem List   Diagnosis Date Noted   Chest pain 12/14/2023   Colon cancer screening 12/14/2022   Chronic respiratory failure with hypoxia (HCC) 09/21/2022   Type 2 diabetes mellitus with hyperglycemia (HCC) 03/25/2022   Vitamin D  deficiency 08/27/2020   Hyperglycemia 08/07/2020   Pneumonia due to  COVID-19 virus 07/28/2020   Acute hypoxemic respiratory failure due to COVID-19 (HCC) 07/27/2020   Combined congestive systolic and diastolic heart failure (HCC) 07/27/2020   Acute on chronic combined systolic and diastolic CHF (congestive heart failure) (HCC) 01/24/2020   Chest pain of uncertain etiology, non obstructive CAD, pain due to acute HF 12/12/2019   Pulmonary hypertension, unspecified (HCC) 12/12/2019   OSA (obstructive sleep apnea) 12/12/2019   Hypoventilation associated with obesity (HCC) 12/12/2019   CAD in native artery non obstructive on cath 12/09/19 12/12/2019   NICM (nonischemic cardiomyopathy) (HCC)    Acute combined systolic and diastolic HF (heart failure) (HCC)    Morbid obesity (HCC)    CHF exacerbation (HCC) 12/07/2019   Cardiomegaly 12/07/2019   Essential hypertension 12/07/2019   CHF (congestive heart failure) (HCC) 12/07/2019    PCP: Bascom Borer  REFERRING PROVIDER: Donnice Fees  REFERRING DIAG:  [phone] (ICD-10-CM) - Posterior tibial tendon dysfunction (PTTD) of left lower extremity    THERAPY DIAG:  Bilateral foot pain  Plantar fasciitis, bilateral  Rationale for Evaluation and Treatment: Rehabilitation  ONSET DATE: a couple years  SUBJECTIVE:   SUBJECTIVE STATEMENT:  arrives in RT boot ( put on herself) pain from working too much    PERTINENT HISTORY: See above  PAIN:  Are you having pain? Yes: NPRS  scale: 8/10 Pain location: both feet, ankles, back of calf sometimes Pain description: throbbing, needle like pinching in the ankle, constant  Aggravating factors: standing for long periods, walking at the park, stairs, shopping Relieving factors: soaking helps for a couple of hours, massage also helps for about 3 hrs    PRECAUTIONS: None  RED FLAGS: None   WEIGHT BEARING RESTRICTIONS: No  FALLS:  Has patient fallen in last 6 months? 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  DIAGNOSTIC FINDINGS: X-rays were clear   COGNITION: Overall cognitive status: Within functional limits for tasks assessed     SENSATION: WFL   MUSCLE LENGTH: Tightness in HS and calves  POSTURE: rounded shoulders  PALPATION: There is a decreased medial arch upon weightbearing bilaterally. Majority tenderness is localized along the course of the posterior tibial, flexor tendons with the right side worse than left.   Not able to appreciate any area pinpoint tenderness. Ankle, subtalar joint range of motion intact.   LOWER EXTREMITY ROM: All WFL but painful with all motions in ankles    LOWER EXTREMITY MMT: 5/5 BLE   FUNCTIONAL TESTS:  5 times sit to stand: 17.41s Timed up and go (TUG): 13.94s  GAIT: Distance walked: in clinic distances Assistive device utilized: None Level of assistance: Modified independence Comments: antalgic gait, walks with a limp due to pain in feet                                                                                                                                TREATMENT DATE:  04/16/24 Paraffin BIL feet STM to the PF bilaterally Joint mobs ankle mid foot and phalanges Calf stretching  04/11/24 L 4 Rocker board  Dyna disc standing WBAT Black bar DF and PF STM to the PF bilaterally Joint mobs mid foot and phalanges Ktape 3 I strips, PF, posterior tib and arch    04/09/24 Nustep level 4 x 6 minutes Slant board calf stretch On airex standing and weight shifts On airex cone toe touches Reviewed HEP STM to the PF  bilaterally Joint mobs mid foot and phalanges Ktape 3 I strips, PF, posterior tib and arch   04/04/24 STM ( with and without instrument assist)to the bilateral feet, passive stretch, joint mobs of the mid foot and ankle Ktape one piece to support PF, one piece to support posterior tibialis and then one to support arch  04/02/24 STM to the bilateral feet, passive stretch, joint mobs of the mid foot and ankle  Ktape one piece to support PF, one piece to support posterior tibialis and then one to support arch   03/29/24 Nustep level 4 x 4 minutes Slant board stretch Discussion on orthotics and where to go and what to look for Reviewed HEP STM to the bilateral feet, passive stretch, joint mobs of the mid foot Ktape one piece to support PF, one piece to support posterior tibialis and then one to support arch   03/26/24- EVAL, HEP    PATIENT EDUCATION:  Education details: POC, HEP, foot anatomy, pain relief tools such as massage, stretching, etc Person educated: Patient and Spouse Education method: Medical illustrator Education comprehension: verbalized understanding, returned demonstration, and needs further education  HOME EXERCISE PROGRAM: Access Code: [facility] URL: [email or web address] Date: 03/26/2024 Prepared by: Almetta Fam  Exercises - Gastroc Stretch on Wall  - 1 x daily - 7 x weekly - 2 reps - 30 hold - Heel Raises with Counter Support  - 1 x daily - 7 x weekly - 2 sets - 10 reps - Long Sitting Calf Stretch with Strap  - 1 x daily - 7 x weekly - 2 reps - 30 hold - Standing Plantar Fascia Mobilization with Small Ball  - 1 x daily - 7 x weekly - Towel Scrunches  - 1 x daily - 7 x weekly - 3 sets - 10 reps - Seated Plantar Fascia Stretch  - 1 x daily - 7 x weekly - 2 reps - 30 hold  ASSESSMENT:  CLINICAL IMPRESSION: pt arrived with increased pain RT > Left and boot on RT  . Pt stated she was so pleased with how she felt sh just overdid it on Saturday  and work. Pt tolerated session fair, noted increased pain and had to adjust pressure. After session felt better.   OBJECTIVE IMPAIRMENTS: Abnormal gait, decreased activity tolerance, decreased balance, difficulty walking, increased fascial restrictions, impaired flexibility, and pain.   ACTIVITY LIMITATIONS: bending, squatting, stairs, and locomotion level  PARTICIPATION LIMITATIONS: cleaning, laundry, driving, shopping, community activity, occupation, and yard work  PERSONAL FACTORS: Age, Past/current experiences, and Time since onset of injury/illness/exacerbation are also affecting patient's functional outcome.   REHAB POTENTIAL: Good  CLINICAL DECISION MAKING: Stable/uncomplicated  EVALUATION COMPLEXITY: Low   GOALS: Goals reviewed with patient? Yes  SHORT TERM GOALS: Target date: 05/07/24  Patient will be independent with initial HEP. Baseline:  Goal status: 04/02/24 MET  2.  Patient will report at least 50% improvement in bilateral ankle/foot pain to improve QOL. (5/10) Baseline: 10/10  Goal status: ongoing 04/09/24    LONG TERM GOALS: Target date: 06/18/24  Patient will be independent with advanced/ongoing HEP to improve outcomes and carryover.  Baseline:  Goal status: INITIAL  2.  Patient will report at least 75% improvement in bilateral ankle/foot pain to improve QOL. (2/10) Baseline: 10/10 at eval Goal status: INITIAL  3.  Patient will be able to ambulate 600' with normal gait pattern without increased foot/ankle pain to access community.  Baseline: antalgic gait Goal status: INITIAL  4. Patient will be able to ascend/descend stairs with 1 HR and reciprocal step pattern safely to access home and community.  Baseline: step to with rails Goal status: INITIAL  5.  Patient will be able to walk, dance, and do a light jog without pain. Baseline: stopped doing all of these d/t pain Goal status: INITIAL   PLAN:  PT FREQUENCY: 2x/week  PT DURATION: 12  weeks  PLANNED INTERVENTIONS: 97110-Therapeutic exercises, 97530- Therapeutic activity, 97112-  Neuromuscular re-education, [id number]- Self Care, 02859- Manual therapy, [phone]- Gait training, [phone]- Electrical stimulation (unattended), [id number]- Ultrasound, [id number]- Ionotophoresis 4mg /ml Dexamethasone , 20560 (1-2 muscles), 20561 (3+ muscles)- Dry Needling, Patient/Family education, Balance training, Stair training, Taping, Joint mobilization, Cryotherapy, and Moist heat  PLAN FOR NEXT SESSION: STW to bilateral PF, calf stretch, calf raises,    Sutton Plake,ANGIE, PTA 04/16/2024, 8:11 AM

## 2024-04-17 ENCOUNTER — Ambulatory Visit: Admitting: Nurse Practitioner

## 2024-04-17 ENCOUNTER — Ambulatory Visit: Payer: Self-pay

## 2024-04-18 ENCOUNTER — Ambulatory Visit: Admitting: Podiatry

## 2024-04-18 ENCOUNTER — Ambulatory Visit: Admitting: Physical Therapy

## 2024-04-18 DIAGNOSIS — M79671 Pain in right foot: Secondary | ICD-10-CM

## 2024-04-18 DIAGNOSIS — M722 Plantar fascial fibromatosis: Secondary | ICD-10-CM

## 2024-04-18 NOTE — Therapy (Signed)
 OUTPATIENT PHYSICAL THERAPY LOWER EXTREMITY TREATMENT   Patient Name: Maria Duncan MRN: [id number] DOB:1966-06-10, 58 y.o., female Today's Date: 04/18/2024  END OF SESSION:  PT End of Session - 04/18/24 0759     Visit Number 8    Number of Visits 27    Date for Recertification  06/18/24    Authorization Type [place] Medicaid    PT Start Time 0800    PT Stop Time 0845    PT Time Calculation (min) 45 min          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) (HCC)    Cataract    not a surgical candidate at this time (02/03/2023)   Chest pain of uncertain etiology, non obstructive CAD, pain due to acute HF 12/12/2019   CHF (congestive heart failure) (HCC)    Diabetes mellitus without complication (HCC)    Diabetic peripheral neuropathy (HCC)    bilateral feet   Heart murmur    childhood   Hyperlipidemia    on meds   Hypertension    on meds   Hypoventilation associated with obesity (HCC) 12/12/2019   Morbid obesity (HCC)    NICM (nonischemic cardiomyopathy) (HCC)    OSA (obstructive sleep apnea) 12/12/2019   uses CPAP   Pulmonary hypertension, unspecified (HCC) 12/12/2019   Past Surgical History:  Procedure Laterality Date   BREAST BIOPSY Left    per pt benign   CESAREAN SECTION  1986   PARTIAL HYSTERECTOMY     RIGHT/LEFT HEART CATH AND CORONARY ANGIOGRAPHY N/A 12/09/2019   Procedure: RIGHT/LEFT HEART CATH AND CORONARY ANGIOGRAPHY;  Surgeon: Cherrie Toribio SAUNDERS, MD;  Location: [facility];  Service: Cardiovascular;  Laterality: N/A;   UTERINE FIBROID SURGERY     WISDOM TOOTH EXTRACTION     Patient Active Problem List   Diagnosis Date Noted   Chest pain 12/14/2023   Colon cancer screening 12/14/2022   Chronic respiratory failure with hypoxia (HCC) 09/21/2022   Type 2 diabetes mellitus with hyperglycemia (HCC) 03/25/2022   Vitamin D  deficiency 08/27/2020   Hyperglycemia 08/07/2020   Pneumonia due to  COVID-19 virus 07/28/2020   Acute hypoxemic respiratory failure due to COVID-19 (HCC) 07/27/2020   Combined congestive systolic and diastolic heart failure (HCC) 07/27/2020   Acute on chronic combined systolic and diastolic CHF (congestive heart failure) (HCC) 01/24/2020   Chest pain of uncertain etiology, non obstructive CAD, pain due to acute HF 12/12/2019   Pulmonary hypertension, unspecified (HCC) 12/12/2019   OSA (obstructive sleep apnea) 12/12/2019   Hypoventilation associated with obesity (HCC) 12/12/2019   CAD in native artery non obstructive on cath 12/09/19 12/12/2019   NICM (nonischemic cardiomyopathy) (HCC)    Acute combined systolic and diastolic HF (heart failure) (HCC)    Morbid obesity (HCC)    CHF exacerbation (HCC) 12/07/2019   Cardiomegaly 12/07/2019   Essential hypertension 12/07/2019   CHF (congestive heart failure) (HCC) 12/07/2019    PCP: Bascom Borer  REFERRING PROVIDER: Donnice Fees  REFERRING DIAG:  [phone] (ICD-10-CM) - Posterior tibial tendon dysfunction (PTTD) of left lower extremity    THERAPY DIAG:  Bilateral foot pain  Plantar fasciitis, bilateral  Rationale for Evaluation and Treatment: Rehabilitation  ONSET DATE: a couple years  SUBJECTIVE:   SUBJECTIVE STATEMENT: felt good after last session. 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 Yes: NPRS  scale: 8/10 Pain location: both feet, ankles, back of calf sometimes Pain description: throbbing, needle like pinching in the ankle, constant  Aggravating factors: standing for long periods, walking at the park, stairs, shopping Relieving factors: soaking helps for a couple of hours, massage also helps for about 3 hrs    PRECAUTIONS: None  RED FLAGS: None   WEIGHT BEARING RESTRICTIONS: No  FALLS:  Has patient fallen in last 6 months? 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  DIAGNOSTIC FINDINGS: X-rays were clear   COGNITION: Overall cognitive status: Within functional limits for tasks assessed     SENSATION: WFL   MUSCLE LENGTH: Tightness in HS and calves  POSTURE: rounded shoulders  PALPATION: There is a decreased medial arch upon weightbearing bilaterally. Majority tenderness is localized along the course of the posterior tibial, flexor tendons with the right side worse than left.   Not able to appreciate any area pinpoint tenderness. Ankle, subtalar joint range of motion intact.   LOWER EXTREMITY ROM: All WFL but painful with all motions in ankles    LOWER EXTREMITY MMT: 5/5 BLE   FUNCTIONAL TESTS:  5 times sit to stand: 17.41s Timed up and go (TUG): 13.94s  GAIT: Distance walked: in clinic distances Assistive device utilized: None Level of assistance: Modified independence Comments: antalgic gait, walks with a limp due to pain in feet                                                                                                                                TREATMENT DATE:  04/18/24 BIL DF 15 Paraffin BIL feet STM to the PF bilaterally Joint mobs ankle mid foot and phalanges Calf stretching   04/16/24 Paraffin BIL feet STM to the PF bilaterally Joint mobs ankle mid foot and phalanges Calf stretching  04/11/24 L 4 Rocker board  Dyna disc standing WBAT Black bar DF and PF STM to the PF bilaterally Joint mobs mid foot and phalanges Ktape 3 I strips, PF, posterior tib and arch    04/09/24 Nustep level 4 x 6 minutes Slant  board calf stretch On airex standing and weight shifts On airex cone toe touches Reviewed HEP STM to the PF bilaterally Joint mobs mid foot and phalanges Ktape 3 I strips, PF, posterior tib and arch   04/04/24 STM ( with and without instrument assist)to the bilateral feet, passive stretch, joint mobs of the mid foot and ankle Ktape one piece to support PF, one piece to support posterior tibialis  and then one to support arch  04/02/24 STM to the bilateral feet, passive stretch, joint mobs of the mid foot and ankle Ktape one piece to support PF, one piece to support posterior tibialis and then one to support arch   03/29/24 Nustep level 4 x 4 minutes Slant board stretch Discussion on orthotics and where to go and what to look for Reviewed HEP STM to the bilateral feet, passive stretch, joint mobs of the mid foot Ktape one piece to support PF, one piece to support posterior tibialis and then one to support arch   03/26/24- EVAL, HEP    PATIENT EDUCATION:  Education details: POC, HEP, foot anatomy, pain relief tools such as massage, stretching, etc Person educated: Patient and Spouse Education method: Medical illustrator Education comprehension: verbalized understanding, returned demonstration, and needs further education  HOME EXERCISE PROGRAM: Access Code: [facility] URL: [email or web address] Date: 03/26/2024 Prepared by: Almetta Fam  Exercises - Gastroc Stretch on Wall  - 1 x daily - 7 x weekly - 2 reps - 30 hold - Heel Raises with Counter Support  - 1 x daily - 7 x weekly - 2 sets - 10 reps - Long Sitting Calf Stretch with Strap  - 1 x daily - 7 x weekly - 2 reps - 30 hold - Standing Plantar Fascia Mobilization with Small Ball  - 1 x daily - 7 x weekly - Towel Scrunches  - 1 x daily - 7 x weekly - 3 sets - 10 reps - Seated Plantar Fascia Stretch  - 1 x daily - 7 x weekly - 2 reps - 30 hold  ASSESSMENT:  CLINICAL IMPRESSION: pt with set back this week  after increased work last week and weekend. BIL DF 15 so less tightness, good motion but pain high. BIL ankle swelling noted. OBJECTIVE IMPAIRMENTS: Abnormal gait, decreased activity tolerance, decreased balance, difficulty walking, increased fascial restrictions, impaired flexibility, and pain.   ACTIVITY LIMITATIONS: bending, squatting, stairs, and locomotion level  PARTICIPATION LIMITATIONS: cleaning, laundry, driving, shopping, community activity, occupation, and yard work  PERSONAL FACTORS: Age, Past/current experiences, and Time since onset of injury/illness/exacerbation are also affecting patient's functional outcome.   REHAB POTENTIAL: Good  CLINICAL DECISION MAKING: Stable/uncomplicated  EVALUATION COMPLEXITY: Low   GOALS: Goals reviewed with patient? Yes  SHORT TERM GOALS: Target date: 05/07/24  Patient will be independent with initial HEP. Baseline:  Goal status: 04/02/24 MET  2.  Patient will report at least 50% improvement in bilateral ankle/foot pain to improve QOL. (5/10) Baseline: 10/10  Goal status: ongoing 04/09/24  and 04/18/24    LONG TERM GOALS: Target date: 06/18/24  Patient will be independent with advanced/ongoing HEP to improve outcomes and carryover.  Baseline:  Goal status: INITIAL  2.  Patient will report at least 75% improvement in bilateral ankle/foot pain to improve QOL. (2/10) Baseline: 10/10 at eval Goal status: INITIAL  3.  Patient will be able to ambulate 600' with normal gait pattern without increased foot/ankle pain to access community.  Baseline: antalgic gait Goal status: INITIAL  4. Patient will be able to ascend/descend stairs with 1 HR and reciprocal step pattern safely to access home and community.  Baseline: step to with rails Goal status: INITIAL  5.  Patient will be able to walk, dance, and do a light jog without pain. Baseline: stopped doing all of these d/t pain Goal status: on going 04/18/24 tried to dance but added to set  back this week   PLAN:  PT  FREQUENCY: 2x/week  PT DURATION: 12 weeks  PLANNED INTERVENTIONS: 97110-Therapeutic exercises, 97530- Therapeutic activity, [id number]- Neuromuscular re-education, 97535- Self Care, 02859- Manual therapy, [id number]- Gait training, [phone]- Electrical stimulation (unattended), [id number]- Ultrasound, [id number]- Ionotophoresis 4mg /ml Dexamethasone , 79439 (1-2 muscles), 20561 (3+ muscles)- Dry Needling, Patient/Family education, Balance training, Stair training, Taping, Joint mobilization, Cryotherapy, and Moist heat  PLAN FOR NEXT SESSION: STW to bilateral PF, calf stretch, calf raises,    Damen Windsor,ANGIE, PTA 04/18/2024, 8:00 AM

## 2024-04-23 ENCOUNTER — Ambulatory Visit: Admitting: Physical Therapy

## 2024-04-24 NOTE — Therapy (Incomplete)
 OUTPATIENT PHYSICAL THERAPY LOWER EXTREMITY TREATMENT   Patient Name: Maria Duncan MRN: [id number] DOB:03-May-1966, 58 y.o., female Today's Date: 04/24/2024  END OF SESSION:    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) (HCC)    Cataract    not a surgical candidate at this time (02/03/2023)   Chest pain of uncertain etiology, non obstructive CAD, pain due to acute HF 12/12/2019   CHF (congestive heart failure) (HCC)    Diabetes mellitus without complication (HCC)    Diabetic peripheral neuropathy (HCC)    bilateral feet   Heart murmur    childhood   Hyperlipidemia    on meds   Hypertension    on meds   Hypoventilation associated with obesity (HCC) 12/12/2019   Morbid obesity (HCC)    NICM (nonischemic cardiomyopathy) (HCC)    OSA (obstructive sleep apnea) 12/12/2019   uses CPAP   Pulmonary hypertension, unspecified (HCC) 12/12/2019   Past Surgical History:  Procedure Laterality Date   BREAST BIOPSY Left    per pt benign   CESAREAN SECTION  1986   PARTIAL HYSTERECTOMY     RIGHT/LEFT HEART CATH AND CORONARY ANGIOGRAPHY N/A 12/09/2019   Procedure: RIGHT/LEFT HEART CATH AND CORONARY ANGIOGRAPHY;  Surgeon: Cherrie Toribio SAUNDERS, MD;  Location: [facility];  Service: Cardiovascular;  Laterality: N/A;   UTERINE FIBROID SURGERY     WISDOM TOOTH EXTRACTION     Patient Active Problem List   Diagnosis Date Noted   Chest pain 12/14/2023   Colon cancer screening 12/14/2022   Chronic respiratory failure with hypoxia (HCC) 09/21/2022   Type 2 diabetes mellitus with hyperglycemia (HCC) 03/25/2022   Vitamin D  deficiency 08/27/2020   Hyperglycemia 08/07/2020   Pneumonia due to COVID-19 virus 07/28/2020   Acute hypoxemic respiratory failure due to COVID-19 (HCC) 07/27/2020   Combined congestive systolic and diastolic heart failure (HCC) 07/27/2020   Acute on chronic combined systolic and diastolic CHF (congestive heart  failure) (HCC) 01/24/2020   Chest pain of uncertain etiology, non obstructive CAD, pain due to acute HF 12/12/2019   Pulmonary hypertension, unspecified (HCC) 12/12/2019   OSA (obstructive sleep apnea) 12/12/2019   Hypoventilation associated with obesity (HCC) 12/12/2019   CAD in native artery non obstructive on cath 12/09/19 12/12/2019   NICM (nonischemic cardiomyopathy) (HCC)    Acute combined systolic and diastolic HF (heart failure) (HCC)    Morbid obesity (HCC)    CHF exacerbation (HCC) 12/07/2019   Cardiomegaly 12/07/2019   Essential hypertension 12/07/2019   CHF (congestive heart failure) (HCC) 12/07/2019    PCP: Bascom Borer  REFERRING PROVIDER: Donnice Fees  REFERRING DIAG:  [phone] (ICD-10-CM) - Posterior tibial tendon dysfunction (PTTD) of left lower extremity    THERAPY DIAG:  No diagnosis found.  Rationale for Evaluation and Treatment: Rehabilitation  ONSET DATE: a couple years  SUBJECTIVE:   SUBJECTIVE STATEMENT: felt good after last session. 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 Yes: NPRS scale: 8/10 Pain location: both feet, ankles, back of calf sometimes Pain description: throbbing, needle like pinching in the ankle, constant  Aggravating factors: standing for long periods, walking at the park, stairs, shopping Relieving factors: soaking helps for a couple of hours, massage also helps for about 3 hrs    PRECAUTIONS: None  RED FLAGS: None   WEIGHT BEARING RESTRICTIONS: No  FALLS:  Has patient  fallen in last 6 months? 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  DIAGNOSTIC FINDINGS: X-rays were clear   COGNITION: Overall cognitive status: Within functional limits for tasks assessed     SENSATION: WFL   MUSCLE LENGTH: Tightness in HS and calves  POSTURE: rounded shoulders  PALPATION: There is a decreased medial arch upon weightbearing bilaterally. Majority tenderness is localized along the course of the posterior tibial, flexor tendons with the right side worse than left.   Not able to appreciate any area pinpoint tenderness. Ankle, subtalar joint range of motion intact.   LOWER EXTREMITY ROM: All WFL but painful with all motions in ankles    LOWER EXTREMITY MMT: 5/5 BLE   FUNCTIONAL TESTS:  5 times sit to stand: 17.41s Timed up and go (TUG): 13.94s  GAIT: Distance walked: in clinic distances Assistive device utilized: None Level of assistance: Modified independence Comments: antalgic gait, walks with a limp due to pain in feet    POSTURE: {posture:25561}  PALPATION: ***   CERVICAL ROM:   {AROM/PROM:27142} ROM A/PROM (deg) eval  Flexion   Extension   Right lateral flexion   Left lateral flexion   Right rotation   Left rotation    (Blank rows = not tested)  UPPER EXTREMITY ROM:  {AROM/PROM:27142} ROM Right eval Left eval  Shoulder flexion    Shoulder extension    Shoulder abduction    Shoulder adduction    Shoulder extension    Shoulder internal rotation    Shoulder external rotation    Elbow flexion    Elbow extension    Wrist flexion    Wrist extension    Wrist ulnar deviation    Wrist radial deviation    Wrist pronation    Wrist supination     (Blank rows = not tested)  UPPER EXTREMITY MMT:  MMT Right eval Left eval  Shoulder flexion    Shoulder extension    Shoulder abduction    Shoulder adduction    Shoulder extension    Shoulder internal rotation    Shoulder external  rotation    Middle trapezius    Lower trapezius    Elbow flexion    Elbow extension    Wrist flexion    Wrist extension    Wrist ulnar deviation    Wrist radial deviation    Wrist pronation    Wrist supination    Grip strength     (Blank rows = not tested)  CERVICAL SPECIAL TESTS:  {Cervical special tests:25246}  FUNCTIONAL TESTS:  {Functional tests:24029}  TREATMENT DATE: 04/25/24 Cervical eval -HEP -STM to neck STM to PF bilaterally, use of tool Calf stretching Calf raises Resisted gait   04/18/24 BIL DF 15 [place]affin BIL feet STM to the PF bilaterally Joint mobs ankle mid foot and phalanges Calf stretching   04/16/24 [place] BIL feet STM to the PF bilaterally Joint mobs ankle mid foot and phalanges Calf stretching  04/11/24 L 4 Rocker board  Dyna disc standing WBAT Black bar DF and PF STM to the PF bilaterally Joint mobs mid foot and phalanges Ktape 3 I strips, PF, posterior tib and arch    04/09/24 Nustep level 4 x 6 minutes Slant board calf stretch On airex standing and weight shifts On airex cone toe touches Reviewed HEP STM to the PF bilaterally Joint mobs mid foot and phalanges Ktape 3 I strips, PF, posterior tib and arch   04/04/24 STM ( with and without instrument assist)to the bilateral feet, passive stretch, joint mobs of the mid foot and ankle Ktape one piece to support PF, one piece to support posterior tibialis and then one to support arch  04/02/24 STM to the bilateral feet, passive stretch, joint mobs of the mid foot and ankle Ktape one piece to support PF, one piece to support posterior tibialis and then one to support arch   03/29/24 Nustep level 4 x 4 minutes Slant board stretch Discussion on orthotics and where to go and what to look for Reviewed HEP STM to the bilateral feet, passive stretch, joint mobs of the mid  foot Ktape one piece to support PF, one piece to support posterior tibialis and then one to support arch   03/26/24- EVAL, HEP    PATIENT EDUCATION:  Education details: POC, HEP, foot anatomy, pain relief tools such as massage, stretching, etc Person educated: Patient and Spouse Education method: Medical illustrator Education comprehension: verbalized understanding, returned demonstration, and needs further education  HOME EXERCISE PROGRAM: Access Code: [facility] URL: [email or web address] Date: 03/26/2024 Prepared by: Almetta Fam  Exercises - Gastroc Stretch on Wall  - 1 x daily - 7 x weekly - 2 reps - 30 hold - Heel Raises with Counter Support  - 1 x daily - 7 x weekly - 2 sets - 10 reps - Long Sitting Calf Stretch with Strap  - 1 x daily - 7 x weekly - 2 reps - 30 hold - Standing Plantar Fascia Mobilization with Small Ball  - 1 x daily - 7 x weekly - Towel Scrunches  - 1 x daily - 7 x weekly - 3 sets - 10 reps - Seated Plantar Fascia Stretch  - 1 x daily - 7 x weekly - 2 reps - 30 hold  ASSESSMENT:  CLINICAL IMPRESSION: pt with set back this week after increased work last week and weekend. BIL DF 15 so less tightness, good motion but pain high. BIL ankle swelling noted. OBJECTIVE IMPAIRMENTS: Abnormal gait, decreased activity tolerance, decreased balance, difficulty walking, increased fascial restrictions, impaired flexibility, and pain.   ACTIVITY LIMITATIONS: bending, squatting, stairs, and locomotion level  PARTICIPATION LIMITATIONS: cleaning, laundry, driving, shopping, community activity, occupation, and yard work  PERSONAL FACTORS: Age, Past/current experiences, and Time since onset of injury/illness/exacerbation are also affecting patient's functional outcome.   REHAB POTENTIAL: Good  CLINICAL DECISION MAKING: Stable/uncomplicated  EVALUATION COMPLEXITY: Low   GOALS: Goals reviewed with patient? Yes  SHORT TERM GOALS: Target date:  05/07/24  Patient will be independent with initial HEP. Baseline:  Goal status: 04/02/24 MET  2.  Patient will report at least 50% improvement in bilateral ankle/foot pain to improve QOL. (5/10) Baseline: 10/10  Goal status: ongoing 04/09/24  and 04/18/24    LONG TERM GOALS: Target date: 06/18/24  Patient will be independent with advanced/ongoing HEP to improve outcomes and carryover.  Baseline:  Goal status: INITIAL  2.  Patient will report at least 75% improvement in bilateral ankle/foot pain to improve QOL. (2/10) Baseline: 10/10 at eval Goal status: INITIAL  3.  Patient will be able to ambulate 600' with normal gait pattern without increased foot/ankle pain to access community.  Baseline: antalgic gait Goal status: INITIAL  4. Patient will be able to ascend/descend stairs with 1 HR and reciprocal step pattern safely to access home and community.  Baseline: step to with rails Goal status: INITIAL  5.  Patient will be able to walk, dance, and do a light jog without pain. Baseline: stopped doing all of these d/t pain Goal status: on going 04/18/24 tried to dance but added to set back this week   PLAN:  PT FREQUENCY: 2x/week  PT DURATION: 12 weeks  PLANNED INTERVENTIONS: 97110-Therapeutic exercises, 97530- Therapeutic activity, 97112- Neuromuscular re-education, 97535- Self Care, 02859- Manual therapy, [phone]- Gait training, [phone]- Electrical stimulation (unattended), 97035- Ultrasound, 02966- Ionotophoresis 4mg /ml Dexamethasone , 79439 (1-2 muscles), 20561 (3+ muscles)- Dry Needling, Patient/Family education, Balance training, Stair training, Taping, Joint mobilization, Cryotherapy, and Moist heat  PLAN FOR NEXT SESSION: STW to bilateral PF, calf stretch, calf raises,    Almetta Fam, PT 04/24/2024, 2:17 PM

## 2024-04-25 ENCOUNTER — Ambulatory Visit

## 2024-04-29 ENCOUNTER — Ambulatory Visit: Payer: Self-pay | Admitting: Nurse Practitioner

## 2024-04-30 ENCOUNTER — Other Ambulatory Visit (INDEPENDENT_AMBULATORY_CARE_PROVIDER_SITE_OTHER): Payer: Self-pay

## 2024-04-30 ENCOUNTER — Other Ambulatory Visit: Payer: Self-pay

## 2024-04-30 ENCOUNTER — Other Ambulatory Visit: Payer: Self-pay | Admitting: Nurse Practitioner

## 2024-04-30 DIAGNOSIS — I5042 Chronic combined systolic (congestive) and diastolic (congestive) heart failure: Secondary | ICD-10-CM

## 2024-04-30 DIAGNOSIS — E1165 Type 2 diabetes mellitus with hyperglycemia: Secondary | ICD-10-CM

## 2024-04-30 DIAGNOSIS — G629 Polyneuropathy, unspecified: Secondary | ICD-10-CM

## 2024-04-30 MED ORDER — TRULICITY 3 MG/0.5ML ~~LOC~~ SOAJ
3.0000 mg | SUBCUTANEOUS | 3 refills | Status: DC
  Filled 2024-04-30 – 2024-06-14 (×3): qty 6, 84d supply, fill #0

## 2024-04-30 MED ORDER — EMPAGLIFLOZIN 10 MG PO TABS
10.0000 mg | ORAL_TABLET | Freq: Every day | ORAL | 3 refills | Status: AC
  Filled 2024-04-30: qty 90, 90d supply, fill #0

## 2024-04-30 NOTE — Progress Notes (Signed)
 04/30/2024 Name: Maria Duncan MRN: [id number] DOB: [birth year]  Chief Complaint  Patient presents with   Diabetes    Maria Duncan is a 58 y.o. year old female who presented for a telephone visit.   They were referred to the pharmacist by their PCP for assistance in managing diabetes. PMH includes CHF(last EF 50% in July 2025, recovered from 35-40% in 2021), HTN, nonobstructive CAD, OSA, T2DM, obesity    Subjective: Patient was last seen by PCP, Bascom Borer, on 02/26/24. BP was 101/70 mmHg, HR 85. A1C improved from 8.0% to 7.8%. She was continued on Trulicity  3 mg weekly. Previously, she was seen by Dr. Cherrie on 01/19/24 and instructed to start Jardiance  10 mg daily. She had a repeat cardiac CT and echo. CT showed minimal nonobstructive CAD and Echo was stable with EF 50% and G1DD.  Today, patient reports doing ok. She has Medicaid again and reports copays can add up for her. She has been tolerating Trulicity  3 mg weekly well and requests 3 mo supply of this at next fill. Also would like 3 mo supply of Jardiance . She continues to report stabbing chest pain that lasts for 15-20 minutes. She has had cardiac workup without new abnormalities. She does not think it is indigestion and is not willing to trial PPI. She is not needing torsemide .   Care Team: Primary Care Provider: Borer Bascom RAMAN, NP ; Next Scheduled Visit: 05/13/24 Cardiologist: Dr. Bensimhon; Next Scheduled Visit: Scheduled 05/20/24  Medication Access/Adherence  Current Pharmacy:  [facility] [address] [place] [address] Phone: [phone] Fax: [phone]  [place] [facility] [address] [place] [address] Phone: [phone] Fax: [phone]  [facility] [place], [place] - [address] [place] [address] Phone: [phone] Fax:  [phone]  [id number] [place], [place] - [address] AT [facility] [address] [place] [address] Phone: [phone] Fax: [phone]  [id number] - [place], [place] - [address] AT [facility] [address] & [facility] [address] [address] [place] [address] Phone: [phone] Fax: [phone]   Patient reports affordability concerns with their medications: No  Patient reports access/transportation concerns to their pharmacy: No  Patient reports adherence concerns with their medications:  No     Diabetes:  Current medications: Trulicity  3 mg weekly (looks like 1.5 mg dose may have been filled last month) Jardiance  10 mg daily  Tolerating Trulicity  well. Unclear whether chest pain that comes and goes is heartburn pain. She does not think it is.   Has glucometer, not currently checking.  Patient denies hypoglycemic s/sx including dizziness, shakiness, sweating. Patient denies hyperglycemic symptoms  Current meal patterns: 3 meals/day (documented at previous visit) - Breakfast: oatmeal, waffles, eggs, malawi bacon or malawi sausage, vegan meats - Lunch: mal[place] and cheese sandwich, fruit bowl, acai bowl, egg, granola, tuna and crackers, salads - Supper: baked malawi, chicken, fish, green beans, collards, okra - Snacks: apple sauce, yogurt, carrots, cucumber with light ranch, grapes, berries - Drinks: water only  Current physical activity (documented at previous visit): used to go for walks, but has had difficulty recently due to ankle pain, trying chair exercises  Current medication access support: Medicaid   Hyperlipidemia/ASCVD Risk Reduction  Current lipid lowering medications: atorvastatin  80 mg daily  Antiplatelet regimen: aspirin  81 mg  daily  ASCVD History: nonobstructive CAD on cath Risk Factors: T2DM, HLD, HTN   Heart Failure (EF 55-60%):  Current medications:  ACEi/ARB/ARNI: Entresto   49-51 mg BID SGLT2i: Jardiance  10 mg daily Beta blocker: carvedilol  6.25 mg BID Mineralocorticoid Receptor Antagonist: spironolactone  25 mg daily Diuretic regimen: torsemide  20 mg daily PRN with Kcl 20 mEq PRN - not needed recently  Current home blood pressure readings: does not check Current home weights: did not evaluate today  Objective:  BP Readings from Last 3 Encounters:  04/10/24 127/85  03/06/24 129/84  02/26/24 101/70    Lab Results  Component Value Date   HGBA1C 7.8 (A) 02/26/2024   HGBA1C 8.0 (A) 11/24/2023   HGBA1C 12.9 (A) 03/29/2023    Lab Results  Component Value Date   CREATININE 0.90 03/06/2024   BUN 15 12/20/2023   NA 140 12/20/2023   K 4.0 12/20/2023   CL 101 12/20/2023   CO2 30 12/20/2023    Lab Results  Component Value Date   CHOL 154 11/24/2023   HDL 56 11/24/2023   LDLCALC 80 11/24/2023   TRIG 100 11/24/2023   CHOLHDL 2.8 11/24/2023    Medications Reviewed Today     Reviewed by Brinda Lorain SQUIBB, RPH (Pharmacist) on 04/30/24 at 1701  Med List Status: <None>   Medication Order Taking? Sig Documenting Provider Last Dose Status Informant  aspirin  81 MG EC tablet [id number]  Take 1 tablet (81 mg total) by mouth daily. Fernande Elspeth BROCKS, MD  Active Self  atorvastatin  (LIPITOR) 80 MG tablet [id number] Yes Take 1 tablet (80 mg total) by mouth daily. [place], [place], [place]  Active   carvedilol  (COREG ) 6.25 MG tablet [id number] Yes Take 1 tablet (6.25 mg total) by mouth 2 (two) times daily. [place], [place], [place]  Active   ciclopirox  (PENLAC ) 8 % solution [id number]  Apply topically at bedtime. Apply over nail and surrounding skin. Apply daily over previous coat. After seven (7) days, may remove with alcohol and continue cycle. Gershon Donnice SAUNDERS, DPM  Active   diclofenac  (VOLTAREN ) 75 MG EC tablet [id number]  Take 1 tablet (75 mg total) by mouth 2 (two) times daily.  Patient taking differently: Take 75 mg by mouth as needed.   [place], Marsa FALCON, DPM   Active   docusate sodium  (COLACE) 100 MG capsule [id number]  Take 1 capsule (100 mg total) by mouth 2 (two) times daily.  Patient not taking: Reported on 04/10/2024   Marylu Leita SAUNDERS, NP  Active Self    Discontinued 04/30/24 1127 (Dose change)   Dulaglutide  (TRULICITY ) 3 MG/0.5ML SOAJ [id number] Yes Inject 3 mg into the skin once a week. Oley Bascom RAMAN, NP  Active   Elastic Bandages & Supports (WRIST SPLINT/COCK-UP/LEFT M) MISC [id number]  Wear nightly Skeet Juliene SAUNDERS, DO  Active   Elastic Bandages & Supports (WRIST SPLINT/COCK-UP/RIGHT M) MISC [id number]  Wear nightly Skeet Juliene SAUNDERS, DO  Active   empagliflozin  (JARDIANCE ) 10 MG TABS tablet [id number] Yes Take 1 tablet (10 mg total) by mouth daily before breakfast. [place], Tonya S, NP  Active   gabapentin  (NEURONTIN ) 100 MG capsule [id number]  Take 1 capsule (100 mg total) by mouth 3 (three) times daily.  Patient taking differently: Take 100 mg by mouth 2 (two) times daily.   Oley Bascom RAMAN, NP  Active            Med Note HASSIE IZETTA SAUNDERS   Mon Sep 25, 2023 10:04 AM) Taking differently - takes 1  capsule twice daily  glucose blood (TRUE METRIX BLOOD GLUCOSE TEST) test strip [id number]  Use as instructed Myrna Camelia HERO, NP  Active   latanoprost  (XALATAN ) 0.005 % ophthalmic solution [id number]  Place 1 drop into both eyes at bedtime.   Active   Multiple Vitamins-Minerals (ZINC  PO) [id number]  Take 1 tablet by mouth daily at 6 (six) AM. [provider]  Active   polyethylene glycol (MIRALAX  / GLYCOLAX ) 17 g packet [id number]  Take 17 g by mouth daily as needed (constipation). Marylu Leita SAUNDERS, NP  Active            Med Note SAMULE IHA   Wed Sep 07, 2022  8:33 AM) Prn   potassium chloride  SA (KLOR-CON  M) 20 MEQ tablet [id number]  Take 1 tablet (20 mEq total) by mouth daily as needed. Take 1 tablet ( ) with torsemide  if needed [place], Harlene HERO, FNP  Active   sacubitril -valsartan  (ENTRESTO ) 49-51 MG [id number] Yes Take 1 tablet by mouth 2  (two) times daily. [place], [place], [place]  Active   spironolactone  (ALDACTONE ) 25 MG tablet [id number] Yes Take 1 tablet (25 mg total) by mouth daily. NEEDS FOLLOW UP FOR ANYMORE REFILLS [place], Harlene HERO, FNP  Active   torsemide  (DEMADEX ) 20 MG tablet [id number] Yes Take 1 tablet (20 mg total) by mouth daily as needed. Take 1 tablet (20mg ) daily as needed- please take 1 tablet (20meq) of potassium with this if needed [place], Harlene HERO, FNP  Active   TRUEplus Lancets 28G MISC [id number]  Use as directed in the morning and at bedtime. Myrna Camelia HERO, NP  Active   Vitamin D -Vitamin K (K2 PLUS D3 PO) [id number]  Take 1 tablet by mouth daily. [provider]  Active              Assessment/Plan:   Diabetes: - Currently uncontrolled based on last A1c 7.8% above goal < 7%, but much improved from 12.9% last year. She does not want to change or escalate any medications. Will focus on adherence and lifestyle per patient preference. Will facilitate getting her refills for 90ds to help with costs. Could consider increasing Jardiance  to 25 mg daily, if patient continues to tolerate well without GU AE (as she previously had issues with yeast infections, likely worsened by poor glycemic control). If next A1C > 8%, can consider enrollment in LIBERATE study. - Reviewed long term cardiovascular and renal outcomes of uncontrolled blood sugar - Reviewed goal A1c, goal fasting, and goal 2 hour post prandial glucose - Reviewed dietary modifications including incorporating protein with each meal and limiting portion size of carbohydrates - Recommend to continue Trulicity  to 3 mg once weekly, Jardiance  10 mg daily - Recommend to check glucose daily - A1c due 05/28/24    Hyperlipidemia/ASCVD Risk Reduction: - Currently uncontrolled with last LDL 80 mg/dl on 7/85/75. LDL-C goal < 55 mg/dL given premature ASCVD (non-obstructive CAD on cath)  - Reviewed long term complications of uncontrolled cholesterol -  Recommend to continue atorvastatin  80 mg daily     Heart Failure: - Currently appropriately managed - patient following with Adv HF clinic - Reviewed appropriate blood pressure monitoring technique and reviewed goal blood pressure - Reviewed to weigh daily and when to contact cardiology with weight gain - Recommend to continue current regimen Current medications:  ACEi/ARB/ARNI: Entresto  49-51 mg BID SGLT2i: Jardiance  10 mg daily Beta blocker: carvedilol  6.25 mg BID Mineralocorticoid Receptor Antagonist: spironolactone  25 mg daily Diuretic regimen: torsemide  20 mg daily PRN  with Kcl 20 mEq PRN     Follow Up Plan: PCP 05/13/24, Cardiology 05/20/24, Pharmacist telephone 06/25/24   Lorain Baseman, PharmD [facility] [phone]

## 2024-04-30 NOTE — Telephone Encounter (Signed)
 Please advise [facility]

## 2024-05-01 ENCOUNTER — Other Ambulatory Visit: Payer: Self-pay

## 2024-05-01 MED ORDER — GABAPENTIN 100 MG PO CAPS
100.0000 mg | ORAL_CAPSULE | Freq: Three times a day (TID) | ORAL | 3 refills | Status: AC
  Filled 2024-05-01: qty 90, 30d supply, fill #0
  Filled 2024-07-01: qty 90, 30d supply, fill #1

## 2024-05-03 ENCOUNTER — Encounter: Payer: Self-pay | Admitting: Adult Health

## 2024-05-03 ENCOUNTER — Ambulatory Visit

## 2024-05-03 ENCOUNTER — Other Ambulatory Visit: Payer: Self-pay

## 2024-05-03 ENCOUNTER — Ambulatory Visit (INDEPENDENT_AMBULATORY_CARE_PROVIDER_SITE_OTHER): Admitting: Adult Health

## 2024-05-03 VITALS — BP 124/84 | HR 73 | Temp 98.4°F | Ht 64.5 in | Wt 222.8 lb

## 2024-05-03 DIAGNOSIS — J309 Allergic rhinitis, unspecified: Secondary | ICD-10-CM

## 2024-05-03 DIAGNOSIS — G4733 Obstructive sleep apnea (adult) (pediatric): Secondary | ICD-10-CM

## 2024-05-03 DIAGNOSIS — Z6837 Body mass index (BMI) 37.0-37.9, adult: Secondary | ICD-10-CM | POA: Diagnosis not present

## 2024-05-03 MED ORDER — FLUTICASONE PROPIONATE 50 MCG/ACT NA SUSP
2.0000 | Freq: Every day | NASAL | 3 refills | Status: AC | PRN
  Filled 2024-05-03: qty 16, 30d supply, fill #0

## 2024-05-03 MED ORDER — AZELASTINE HCL 0.1 % NA SOLN
1.0000 | Freq: Every evening | NASAL | 3 refills | Status: AC | PRN
  Filled 2024-05-03: qty 30, 90d supply, fill #0

## 2024-05-03 MED ORDER — LORATADINE 10 MG PO TABS
10.0000 mg | ORAL_TABLET | Freq: Every day | ORAL | 3 refills | Status: AC | PRN
  Filled 2024-05-03: qty 30, 30d supply, fill #0

## 2024-05-03 NOTE — Patient Instructions (Addendum)
 Continue on CPAP, wear all night long , try to get in at least 6hr or more  Work on healthy weight  Do not drive if sleepy  Healthy sleep regimen   Saline nasal spray Twice daily   Saline nasal gel At bedtime   Follow up in 6 weeks and As needed   Saline nasal spray Twice daily   Begin Claritin 10mg  daily for 2 weeks and then daily As needed   Begin Flonase 2 puffs daily in am for 2 weeks and then As needed   Begin Astelin  1 puffs At bedtime  for 2 weeks and then as needed   Follow up in 6 months and As needed

## 2024-05-03 NOTE — Progress Notes (Signed)
 @Patient  ID: Maria Duncan, female    DOB: [birth year], 58 y.o.   MRN: [id number]  Chief Complaint  Patient presents with   Follow-up    CPAP titration    Referring provider: Oley Bascom RAMAN, NP  HPI: 58 yo female seen for sleep consult February 2024 to establish for OSA Medical history significant for combined congestive heart failure, nonischemic cardiomyopathy, coronary artery disease, diabetes     TEST/EVENTS :  NPSG June 2021 showed severe sleep apnea with AHI at 99/hour and SpO2 low at 51%.  Optimal control on CPAP 17 cm H2O.   2D echo May 2021 EF 35 to 40%, grade 1 diastolic dysfunction and moderately elevated pulmonary artery systolic pressure   Right and left heart cath Dec 09, 2019 minimal nonobstructive coronary artery disease, severe nonischemic cardiomyopathy with a EF of 25%, felt to have probable associated chronic  OHS as patient's pCO2 was 90 with near compensation on ABG.   2D echo October 2021 showed EF at 50 to 55%, grade 1 diastolic dysfunction, RV size mildly enlarged, (improved LV and RV function) Discussed the use of AI scribe software for clinical note transcription with the patient, who gave verbal consent to proceed.  History of Present Illness Maria Duncan is a 58 year old female with severe sleep apnea and cardiomyopathy who presents with sinus drainage affecting CPAP use.  She experiences sinus dripping and drainage, which she attributes to allergies, making it difficult to wear her CPAP mask. She brings up phlegm and finds saline rinses helpful. She is not currently taking any allergy medications.  CPAP titration study completed in May 2025 showed showed her optimal control at 18-19 cmH2o pressure. CPAP pressure was changed to auto CPAP 12-20cmH2o. Says she is doing better on CPAP but currently sinus drainage each night limits how low she can wear CPAP. CPAP download shows excellent compliance with average usage at 3.5hr each night.AHI 3.2/hr. Avg  pressure 14cmH2o.  She is using a modified full face mask which is working well. Feels much better when she wears CPAP all night long.   No fever, discolored nasal discharge, or severe sinus pain, but notes that the sinus issues are seasonal and related to weather changes. Complains of daily clear sinus drainage and post nasal drip. Has tried saline nasal spray with limited benefit. No fever or discolored mucus        Allergies  Allergen Reactions   Hyzaar [Losartan Potassium-Hctz] Nausea And Vomiting and Cough    Patient can take Entresto  now    Immunization History  Administered Date(s) Administered   Tdap 09/17/2020    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) (HCC)    Cataract    not a surgical candidate at this time (02/03/2023)   Chest pain of uncertain etiology, non obstructive CAD, pain due to acute HF 12/12/2019   CHF (congestive heart failure) (HCC)    Diabetes mellitus without complication (HCC)    Diabetic peripheral neuropathy (HCC)    bilateral feet   Heart murmur    childhood   Hyperlipidemia    on meds   Hypertension    on meds   Hypoventilation associated with obesity (HCC) 12/12/2019   Morbid obesity (HCC)    NICM (nonischemic cardiomyopathy) (HCC)    OSA (obstructive sleep apnea) 12/12/2019   uses CPAP   Pulmonary hypertension, unspecified (HCC) 12/12/2019    Tobacco History: Social History   Tobacco Use  Smoking Status Never  Smokeless Tobacco Never   Counseling given: Not Answered   Outpatient Medications Prior to Visit  Medication Sig Dispense Refill   aspirin  81 MG EC tablet Take 1 tablet (81 mg total) by mouth daily. 90 tablet 3   atorvastatin  (LIPITOR) 80 MG tablet Take 1 tablet (80 mg total) by mouth daily. 90 tablet 3   carvedilol  (COREG ) 6.25 MG tablet Take 1 tablet (6.25 mg total) by mouth 2 (two) times daily. 60 tablet 3   ciclopirox  (PENLAC ) 8 % solution Apply topically at  bedtime. Apply over nail and surrounding skin. Apply daily over previous coat. After seven (7) days, may remove with alcohol and continue cycle. 6.6 mL 2   diclofenac  (VOLTAREN ) 75 MG EC tablet Take 1 tablet (75 mg total) by mouth 2 (two) times daily. (Patient taking differently: Take 75 mg by mouth as needed.) 50 tablet 2   docusate sodium  (COLACE) 100 MG capsule Take 1 capsule (100 mg total) by mouth 2 (two) times daily. (Patient taking differently: Take 100 mg by mouth daily as needed.) 10 capsule 0   Dulaglutide  (TRULICITY ) 3 MG/0.5ML SOAJ Inject 3 mg into the skin once a week. 6 mL 3   Elastic Bandages & Supports (WRIST SPLINT/COCK-UP/LEFT M) MISC Wear nightly 1 each 0   Elastic Bandages & Supports (WRIST SPLINT/COCK-UP/RIGHT M) MISC Wear nightly 1 each 0   empagliflozin  (JARDIANCE ) 10 MG TABS tablet Take 1 tablet (10 mg total) by mouth daily before breakfast. 90 tablet 3   gabapentin  (NEURONTIN ) 100 MG capsule Take 1 capsule (100 mg total) by mouth 3 (three) times daily. (Patient taking differently: Take 100 mg by mouth 2 (two) times daily.) 90 capsule 3   glucose blood (TRUE METRIX BLOOD GLUCOSE TEST) test strip Use as instructed 100 each 12   latanoprost  (XALATAN ) 0.005 % ophthalmic solution Place 1 drop into both eyes at bedtime. 10 mL 3   Multiple Vitamins-Minerals (ZINC  PO) Take 1 tablet by mouth daily at 6 (six) AM.     polyethylene glycol (MIRALAX  / GLYCOLAX ) 17 g packet Take 17 g by mouth daily as needed (constipation). 14 each 0   sacubitril -valsartan  (ENTRESTO ) 49-51 MG Take 1 tablet by mouth 2 (two) times daily. 180 tablet 3   spironolactone  (ALDACTONE ) 25 MG tablet Take 1 tablet (25 mg total) by mouth daily. NEEDS FOLLOW UP FOR ANYMORE REFILLS 30 tablet 5   torsemide  (DEMADEX ) 20 MG tablet Take 1 tablet (20 mg total) by mouth daily as needed. Take 1 tablet (20mg ) daily as needed- please take 1 tablet (20meq) of potassium with this if needed 30 tablet 3   TRUEplus Lancets 28G MISC Use  as directed in the morning and at bedtime. 100 each 1   Vitamin D -Vitamin K (K2 PLUS D3 PO) Take 1 tablet by mouth daily.     potassium chloride  SA (KLOR-CON  M) 20 MEQ tablet Take 1 tablet (20 mEq total) by mouth daily as needed. Take 1 tablet (20meq) with torsemide  if needed (Patient not taking: Reported on 05/03/2024) 30 tablet 3   No facility-administered medications prior to visit.     Review of Systems:   Constitutional:   No  weight loss, night sweats,  Fevers, chills, fatigue, or  lassitude.  HEENT:   No headaches,  Difficulty swallowing,  Tooth/dental problems, or  Sore throat,                No sneezing, itching, ear ache, +nasal congestion, post nasal drip,  CV:  No chest pain,  Orthopnea, PND, swelling in lower extremities, anasarca, dizziness, palpitations, syncope.   GI  No heartburn, indigestion, abdominal pain, nausea, vomiting, diarrhea, change in bowel habits, loss of appetite, bloody stools.   Resp: No shortness of breath with exertion or at rest.  No excess mucus, no productive cough,  No non-productive cough,  No coughing up of blood.  No change in color of mucus.  No wheezing.  No chest wall deformity  Skin: no rash or lesions.  GU: no dysuria, change in color of urine, no urgency or frequency.  No flank pain, no hematuria   MS:  No joint pain or swelling.  No decreased range of motion.  No back pain.    Physical Exam  BP 124/84   Pulse 73   Temp 98.4 F (36.9 C)   Ht 5' 4.5 (1.638 m)   Wt 222 lb 12.8 oz (101.1 kg)   SpO2 99% Comment: RA  BMI 37.65 kg/m   GEN: A/Ox3; pleasant , NAD, well nourished    HEENT:  [place]/AT,   NOSE-clear, THROAT-clear, no lesions, no postnasal drip or exudate noted.   NECK:  Supple w/ fair ROM; no JVD; normal carotid impulses w/o bruits; no thyromegaly or nodules palpated; no lymphadenopathy.    RESP  Clear  P & A; w/o, wheezes/ rales/ or rhonchi. no accessory muscle use, no dullness to percussion  CARD:  RRR, no m/r/g,  no peripheral edema, pulses intact, no cyanosis or clubbing.  GI:   Soft & nt; nml bowel sounds; no organomegaly or masses detected.   Musco: Warm bil, no deformities or joint swelling noted.   Neuro: alert, no focal deficits noted.    Skin: Warm, no lesions or rashes    Lab Results:  CBC   BMET  No results found.  Administration History     None           No data to display          No results found for: NITRICOXIDE      Assessment & Plan:   Assessment and Plan Assessment & Plan Obstructive sleep apnea   Severe obstructive sleep apnea is well-controlled with CPAP, averaging 3.5 hours of use per night, with some nights reaching 6-7 hours, which significantly improves daytime symptoms.  . She uses a comfortable small F40 AirFit mask with distilled water for humidification. Continue CPAP therapy, aiming for 6 or more hours per night. Encourage CPAP use during daytime naps to increase total usage time.  . Emphasize the importance of CPAP in preventing apnea-related complications, especially given her cardiac history. - discussed how weight can impact sleep and risk for sleep disordered breathing - discussed options to assist with weight loss: combination of diet modification, cardiovascular and strength training exercises   - had an extensive discussion regarding the adverse health consequences related to untreated sleep disordered breathing - specifically discussed the risks for hypertension, coronary artery disease, cardiac dysrhythmias, cerebrovascular disease, and diabetes - lifestyle modification discussed   - discussed how sleep disruption can increase risk of accidents, particularly when driving - safe driving practices were discussed     Allergic rhinitis   Allergic rhinitis, likely worsened by seasonal changes, causes sinus drip and difficulty wearing the CPAP mask. Symptoms include nasal drip and occasional phlegm without fever or discolored  discharge. Current saline rinse is effective for moisturizing but insufficient for drying nasal secretions. Begin Claritin 10 mg daily for two weeks to assess  effectiveness for seasonal allergies. Use Flonase nasal spray as needed, starting with a two-week trial. Use Astelin  nasal spray at bedtime for two weeks, then as needed for nasal drip. Continue saline nasal rinse twice daily. Send prescriptions to [facility] and arrange a three-month supply to reduce pharmacy visits.  Morbid obesity bmi 37- healthy weight loss encouraged   Plan  Patient Instructions  Continue on CPAP, wear all night long , try to get in at least 6hr or more  Work on healthy weight  Do not drive if sleepy  Healthy sleep regimen   Saline nasal spray Twice daily   Saline nasal gel At bedtime   Follow up in 6 weeks and As needed   Saline nasal spray Twice daily   Begin Claritin 10mg  daily for 2 weeks and then daily As needed   Begin Flonase 2 puffs daily in am for 2 weeks and then As needed   Begin Astelin  1 puffs At bedtime  for 2 weeks and then as needed   Follow up in 6 months and As needed         Madelin Stank, NP 05/03/2024

## 2024-05-06 ENCOUNTER — Encounter (HOSPITAL_COMMUNITY): Payer: Self-pay

## 2024-05-06 ENCOUNTER — Ambulatory Visit: Attending: Podiatry

## 2024-05-06 ENCOUNTER — Other Ambulatory Visit: Payer: Self-pay

## 2024-05-06 DIAGNOSIS — R2689 Other abnormalities of gait and mobility: Secondary | ICD-10-CM | POA: Insufficient documentation

## 2024-05-06 DIAGNOSIS — M722 Plantar fascial fibromatosis: Secondary | ICD-10-CM | POA: Diagnosis present

## 2024-05-06 DIAGNOSIS — M79671 Pain in right foot: Secondary | ICD-10-CM | POA: Insufficient documentation

## 2024-05-06 DIAGNOSIS — M79672 Pain in left foot: Secondary | ICD-10-CM | POA: Insufficient documentation

## 2024-05-06 NOTE — Therapy (Signed)
 OUTPATIENT PHYSICAL THERAPY LOWER EXTREMITY TREATMENT   Patient Name: Maria Duncan MRN: [id number] DOB:05-31-1966, 58 y.o., female Today's Date: 05/06/2024  END OF SESSION:  PT End of Session - 05/06/24 1015     Visit Number 9    Number of Visits 27    Date for Recertification  06/18/24    PT Start Time 1015    PT Stop Time 1100    PT Time Calculation (min) 45 min    Activity Tolerance Patient tolerated treatment well;Patient limited by pain    Behavior During Therapy [facility] for tasks assessed/performed           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) (HCC)    Cataract    not a surgical candidate at this time (02/03/2023)   Chest pain of uncertain etiology, non obstructive CAD, pain due to acute HF 12/12/2019   CHF (congestive heart failure) (HCC)    Diabetes mellitus without complication (HCC)    Diabetic peripheral neuropathy (HCC)    bilateral feet   Heart murmur    childhood   Hyperlipidemia    on meds   Hypertension    on meds   Hypoventilation associated with obesity (HCC) 12/12/2019   Morbid obesity (HCC)    NICM (nonischemic cardiomyopathy) (HCC)    OSA (obstructive sleep apnea) 12/12/2019   uses CPAP   Pulmonary hypertension, unspecified (HCC) 12/12/2019   Past Surgical History:  Procedure Laterality Date   BREAST BIOPSY Left    per pt benign   CESAREAN SECTION  1986   PARTIAL HYSTERECTOMY     RIGHT/LEFT HEART CATH AND CORONARY ANGIOGRAPHY N/A 12/09/2019   Procedure: RIGHT/LEFT HEART CATH AND CORONARY ANGIOGRAPHY;  Surgeon: Cherrie Toribio SAUNDERS, MD;  Location: [facility] INVASIVE CV [facility];  Service: Cardiovascular;  Laterality: N/A;   UTERINE FIBROID SURGERY     WISDOM TOOTH EXTRACTION     Patient Active Problem List   Diagnosis Date Noted   Chest pain 12/14/2023   Colon cancer screening 12/14/2022   Chronic respiratory failure with hypoxia (HCC) 09/21/2022   Type 2 diabetes mellitus with  hyperglycemia (HCC) 03/25/2022   Vitamin D  deficiency 08/27/2020   Hyperglycemia 08/07/2020   Pneumonia due to COVID-19 virus 07/28/2020   Acute hypoxemic respiratory failure due to COVID-19 (HCC) 07/27/2020   Combined congestive systolic and diastolic heart failure (HCC) 07/27/2020   Acute on chronic combined systolic and diastolic CHF (congestive heart failure) (HCC) 01/24/2020   Chest pain of uncertain etiology, non obstructive CAD, pain due to acute HF 12/12/2019   Pulmonary hypertension, unspecified (HCC) 12/12/2019   OSA (obstructive sleep apnea) 12/12/2019   Hypoventilation associated with obesity (HCC) 12/12/2019   CAD in native artery non obstructive on cath 12/09/19 12/12/2019   NICM (nonischemic cardiomyopathy) (HCC)    Acute combined systolic and diastolic HF (heart failure) (HCC)    Morbid obesity (HCC)    CHF exacerbation (HCC) 12/07/2019   Cardiomegaly 12/07/2019   Essential hypertension 12/07/2019   CHF (congestive heart failure) (HCC) 12/07/2019    PCP: Bascom Borer  REFERRING PROVIDER: Donnice Fees  REFERRING DIAG:  [phone] (ICD-10-CM) - Posterior tibial tendon dysfunction (PTTD) of left lower extremity    THERAPY DIAG:  Bilateral foot pain  Plantar fasciitis, bilateral  Other abnormalities of gait and mobility  Rationale for Evaluation and Treatment: Rehabilitation  ONSET DATE: a couple years  SUBJECTIVE:   SUBJECTIVE STATEMENT:   Pt reports that she has  been on her feet a 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 She also states that she has been having some restless leg syndrome and cramping in her legs again from her diabetes.   PERTINENT HISTORY: See above  PAIN:  Are you having pain? Yes: NPRS scale: 9/10 Pain location: both feet, ankles, back of calf sometimes Pain description: throbbing, needle like pinching in the ankle, constant  Aggravating factors: standing for long periods, walking at the park, stairs,  shopping Relieving factors: soaking helps for a couple of hours, massage also helps for about 3 hrs    PRECAUTIONS: None  RED FLAGS: None   WEIGHT BEARING RESTRICTIONS: No  FALLS:  Has patient fallen in last 6 months? 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  DIAGNOSTIC FINDINGS: X-rays were clear   COGNITION: Overall cognitive status: Within functional limits for tasks assessed     SENSATION: WFL   MUSCLE LENGTH: Tightness in HS and calves  POSTURE: rounded shoulders  PALPATION: There is a decreased medial arch upon weightbearing bilaterally. Majority tenderness is localized along the course of the posterior tibial, flexor tendons with the right side worse than left.   Not able to appreciate any area pinpoint tenderness. Ankle, subtalar joint range of motion intact.   LOWER EXTREMITY ROM: All WFL but painful with all motions in ankles    LOWER EXTREMITY MMT: 5/5 BLE   FUNCTIONAL TESTS:  5 times sit to stand: 17.41s Timed up and go (TUG): 13.94s  GAIT: Distance walked: in clinic distances Assistive device utilized: None Level of assistance: Modified independence Comments: antalgic gait, walks with a limp due to pain in feet                                                                                                                                TREATMENT DATE:  05/06/24 Paraffin BIL Feet STM BIL PF, Plantar Fascia and Peroneals Calf Stretching Joint Mobilizations to ankle, mid foot, and phalanges  04/18/24 BIL DF 15 Paraffin  BIL feet STM to the PF bilaterally Joint mobs ankle mid foot and phalanges Calf stretching   04/16/24 [place]affin BIL feet STM to the PF bilaterally Joint mobs ankle mid foot and phalanges Calf stretching  04/11/24 L 4 Rocker board  Dyna disc standing WBAT Black bar DF and PF STM to the PF bilaterally Joint mobs mid foot and phalanges Ktape 3 I strips, PF, posterior tib and arch    04/09/24 Nustep level 4 x 6 minutes Slant  board calf stretch On airex standing and weight shifts On airex cone toe touches Reviewed HEP STM to the PF bilaterally Joint mobs mid foot and phalanges Ktape 3 I strips, PF, posterior tib and arch   04/04/24 STM ( with and without instrument assist)to the bilateral feet, passive stretch, joint mobs of the mid foot and ankle Ktape one piece to support PF, one piece to support posterior tibialis and then one to support arch  04/02/24 STM to the bilateral feet, passive stretch, joint mobs of the mid foot and ankle Ktape one piece to support PF, one piece to support posterior tibialis and then one to support arch   03/29/24 Nustep level 4 x 4 minutes Slant board stretch Discussion on orthotics and where to go and what to look for Reviewed HEP STM to the bilateral feet, passive stretch, joint mobs of the mid foot Ktape one piece to support PF, one piece to support posterior tibialis and then one to support arch   03/26/24- EVAL, HEP    PATIENT EDUCATION:  Education details: POC, HEP, foot anatomy, pain relief tools such as massage, stretching, etc Person educated: Patient and Spouse Education method: Medical illustrator Education comprehension: verbalized understanding, returned demonstration, and needs further education  HOME EXERCISE PROGRAM: Access Code: [facility] URL: [email or web address] Date: 03/26/2024 Prepared by: Almetta Fam  Exercises - Gastroc Stretch on Wall  - 1 x daily - 7 x weekly - 2 reps - 30 hold -  Heel Raises with Counter Support  - 1 x daily - 7 x weekly - 2 sets - 10 reps - Long Sitting Calf Stretch with Strap  - 1 x daily - 7 x weekly - 2 reps - 30 hold - Standing Plantar Fascia Mobilization with Small Ball  - 1 x daily - 7 x weekly - Towel Scrunches  - 1 x daily - 7 x weekly - 3 sets - 10 reps - Seated Plantar Fascia Stretch  - 1 x daily - 7 x weekly - 2 reps - 30 hold  ASSESSMENT:  CLINICAL IMPRESSION: Pt tolerated session well. D/t positive feedback about paraffin, began with a paraffin bath to encourage a decrease in muscle tension. Responded well to PT and interventions provided. Able to improve pt's pain from 9/10 to 6/10 at end of session. Will continue to monitor symptoms and address complaints to improve functional mobility tolerance.   OBJECTIVE IMPAIRMENTS: Abnormal gait, decreased activity tolerance, decreased balance, difficulty walking, increased fascial restrictions, impaired flexibility, and pain.   ACTIVITY LIMITATIONS: bending, squatting, stairs, and locomotion level  PARTICIPATION LIMITATIONS: cleaning, laundry, driving, shopping, community activity, occupation, and yard work  PERSONAL FACTORS: Age, Past/current experiences, and Time since onset of injury/illness/exacerbation are also affecting patient's functional outcome.   REHAB POTENTIAL: Good  CLINICAL DECISION MAKING: Stable/uncomplicated  EVALUATION COMPLEXITY: Low   GOALS: Goals reviewed with patient? Yes  SHORT TERM GOALS: Target date: 05/07/24  Patient will be independent with initial HEP. Baseline:  Goal status: 04/02/24 MET  2.  Patient will report at least 50% improvement in bilateral ankle/foot pain to improve QOL. (5/10) Baseline: 10/10  Goal status: ongoing 04/09/24  and 04/18/24    LONG TERM GOALS: Target date: 06/18/24  Patient will be independent with advanced/ongoing HEP to improve outcomes and carryover.  Baseline:  Goal status: INITIAL  2.  Patient will report at least 75%  improvement in bilateral ankle/foot pain to improve QOL. (2/10) Baseline: 10/10 at eval; 6/10 end of session 10/13 Goal status: INITIAL  3.  Patient will be able to ambulate 600' with normal gait pattern without increased foot/ankle pain to access community.  Baseline: antalgic gait; progressing 10/13 (still antalgic) Goal status: INITIAL  4. Patient will be able to ascend/descend stairs with 1 HR and reciprocal step pattern safely to access home and community.  Baseline: step to with rails Goal status: INITIAL  5.  Patient will be able to walk, dance, and do a light jog without pain. Baseline: stopped doing all of these d/t pain Goal status: on going 04/18/24 tried to dance but added to set back this week   PLAN:  PT FREQUENCY: 2x/week  PT DURATION: 12 weeks  PLANNED INTERVENTIONS: 97110-Therapeutic exercises, 97530- Therapeutic activity, 97112- Neuromuscular re-education, 97535- Self Care, 02859- Manual therapy, [phone]- Gait training, [phone]- Electrical stimulation (unattended), 97035- Ultrasound, 02966- Ionotophoresis 4mg /ml Dexamethasone , 79439 (1-2 muscles), 20561 (3+ muscles)- Dry Needling, Patient/Family education, Balance training, Stair training, Taping, Joint mobilization, Cryotherapy, and Moist heat  PLAN FOR NEXT SESSION: STW to bilateral PF, calf stretch, calf raises, 10th VISIT   Deneice Brownie, PT, DPT 05/06/2024, 11:06 AM

## 2024-05-07 ENCOUNTER — Ambulatory Visit (HOSPITAL_COMMUNITY)
Admission: RE | Admit: 2024-05-07 | Discharge: 2024-05-07 | Disposition: A | Discharge status: Home / Self Care | Source: Ambulatory Visit | Attending: Internal Medicine | Admitting: Internal Medicine

## 2024-05-07 ENCOUNTER — Encounter (HOSPITAL_COMMUNITY): Payer: Self-pay

## 2024-05-07 DIAGNOSIS — I5042 Chronic combined systolic (congestive) and diastolic (congestive) heart failure: Secondary | ICD-10-CM

## 2024-05-13 ENCOUNTER — Ambulatory Visit (INDEPENDENT_AMBULATORY_CARE_PROVIDER_SITE_OTHER): Payer: Self-pay | Admitting: Nurse Practitioner

## 2024-05-13 ENCOUNTER — Encounter: Payer: Self-pay | Admitting: Nurse Practitioner

## 2024-05-13 ENCOUNTER — Other Ambulatory Visit: Payer: Self-pay

## 2024-05-13 VITALS — BP 120/65 | HR 72 | Wt 223.0 lb

## 2024-05-13 DIAGNOSIS — R1011 Right upper quadrant pain: Secondary | ICD-10-CM

## 2024-05-13 DIAGNOSIS — E1165 Type 2 diabetes mellitus with hyperglycemia: Secondary | ICD-10-CM

## 2024-05-13 DIAGNOSIS — Z1231 Encounter for screening mammogram for malignant neoplasm of breast: Secondary | ICD-10-CM

## 2024-05-13 DIAGNOSIS — R748 Abnormal levels of other serum enzymes: Secondary | ICD-10-CM

## 2024-05-13 LAB — POCT GLYCOSYLATED HEMOGLOBIN (HGB A1C): Hemoglobin A1C: 7.1 % — AB (ref 4.0–5.6)

## 2024-05-13 MED ORDER — OMEPRAZOLE 20 MG PO CPDR
20.0000 mg | DELAYED_RELEASE_CAPSULE | Freq: Every day | ORAL | 3 refills | Status: AC
  Filled 2024-05-13: qty 30, 30d supply, fill #0

## 2024-05-13 NOTE — Progress Notes (Signed)
 Subjective   Patient ID: Maria Duncan, female    DOB: 12-07-1965, 58 y.o.   MRN: [id number]  Chief Complaint  Patient presents with   Medical Management of Chronic Issues    Referring provider: Oley Bascom RAMAN, NP  Maria Duncan is a 58 y.o. 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)  (HCC) No date: Cataract     Comment:  not a surgical candidate at this time (02/03/2023) 12/12/2019: Chest pain of uncertain etiology, non obstructive CAD,  pain due to acute HF No date: CHF (congestive heart failure) (HCC) No date: Diabetes mellitus without complication (HCC) No date: Diabetic peripheral neuropathy (HCC)     Comment:  bilateral feet No date: Heart murmur     Comment:  childhood No date: Hyperlipidemia     Comment:  on meds No date: Hypertension     Comment:  on meds 12/12/2019: Hypoventilation associated with obesity (HCC) No date: Morbid obesity (HCC) No date: NICM (nonischemic cardiomyopathy) (HCC) 12/12/2019: OSA (obstructive sleep apnea)     Comment:  uses CPAP 12/12/2019: Pulmonary hypertension, unspecified (HCC)   HPI  Patient presents today for follow-up visit.    Overall she has been doing well since last visit here.  A1c is improved since last visit but still elevated 7.1 today.  Patient is on Trulicity .  Patient is having right upper quadrant abdominal pain for the past few weeks.  We will order ultrasound.  Will recheck amylase and lipase today.  Amylase was elevated at last check.  Handouts were given on diet and exercise.   Denies f/c/s, n/v/d, hemoptysis, PND, leg swelling.    Allergies  Allergen Reactions   Hyzaar [Losartan Potassium-Hctz] Nausea And Vomiting and Cough    Patient can take Entresto  now    Immunization History  Administered Date(s) Administered   Tdap 09/17/2020    Tobacco History: Social History   Tobacco Use  Smoking Status Never  Smokeless Tobacco Never    Counseling given: Not Answered   Outpatient Encounter Medications as of 05/13/2024  Medication Sig   aspirin  81 MG EC tablet Take 1 tablet (81 mg total) by mouth daily.   atorvastatin  (LIPITOR) 80 MG tablet Take 1 tablet (80 mg total) by mouth daily.   azelastine  (ASTELIN ) 0.1 % nasal spray Place 1 spray into both nostrils at bedtime as needed for rhinitis.   carvedilol  (COREG ) 6.25 MG tablet Take 1 tablet (6.25 mg total) by mouth 2 (two) times daily.   ciclopirox  (PENLAC ) 8 % solution Apply topically at bedtime. Apply over nail and surrounding skin. Apply daily over previous coat. After seven (7) days, may remove with alcohol and continue cycle.   diclofenac  (VOLTAREN ) 75 MG EC tablet Take 1 tablet (75 mg total) by mouth 2 (two) times daily. (Patient taking differently: Take 75 mg by mouth as needed.)   docusate sodium  (COLACE) 100 MG capsule Take 1 capsule (100 mg total) by mouth 2 (two) times daily. (Patient taking differently: Take 100 mg by mouth daily as needed.)   Dulaglutide  (TRULICITY ) 3 MG/0.5ML SOAJ Inject 3 mg into the skin once a week.   Elastic Bandages & Supports (WRIST SPLINT/COCK-UP/LEFT M) MISC Wear nightly   Elastic Bandages & Supports (WRIST SPLINT/COCK-UP/RIGHT M) MISC Wear nightly   empagliflozin  (JARDIANCE ) 10 MG TABS tablet Take 1 tablet (10 mg total) by mouth daily before breakfast.   fluticasone (FLONASE) 50 MCG/ACT nasal spray Place 2 sprays into both nostrils  daily as needed for allergies or rhinitis.   gabapentin  (NEURONTIN ) 100 MG capsule Take 1 capsule (100 mg total) by mouth 3 (three) times daily. (Patient taking differently: Take 100 mg by mouth 2 (two) times daily.)   glucose blood (TRUE METRIX BLOOD GLUCOSE TEST) test strip Use as instructed   latanoprost  (XALATAN ) 0.005 % ophthalmic solution Place 1 drop into both eyes at bedtime.   loratadine (CLARITIN) 10 MG tablet Take 1 tablet (10 mg total) by mouth daily as needed for allergies.   Multiple  Vitamins-Minerals (ZINC  PO) Take 1 tablet by mouth daily at 6 (six) AM.   polyethylene glycol (MIRALAX  / GLYCOLAX ) 17 g packet Take 17 g by mouth daily as needed (constipation).   potassium chloride  SA (KLOR-CON  M) 20 MEQ tablet Take 1 tablet (20 mEq total) by mouth daily as needed. Take 1 tablet ( ) with torsemide  if needed   sacubitril -valsartan  (ENTRESTO ) 49-51 MG Take 1 tablet by mouth 2 (two) times daily.   spironolactone  (ALDACTONE ) 25 MG tablet Take 1 tablet (25 mg total) by mouth daily. NEEDS FOLLOW UP FOR ANYMORE REFILLS   torsemide  (DEMADEX ) 20 MG tablet Take 1 tablet (20 mg total) by mouth daily as needed. Take 1 tablet (20mg ) daily as needed- please take 1 tablet (20meq) of potassium with this if needed   TRUEplus Lancets 28G MISC Use as directed in the morning and at bedtime.   Vitamin D -Vitamin K (K2 PLUS D3 PO) Take 1 tablet by mouth daily.   No facility-administered encounter medications on file as of 05/13/2024.    Review of Systems  Review of Systems  Constitutional: Negative.   HENT: Negative.    Cardiovascular: Negative.   Gastrointestinal: Negative.   Allergic/Immunologic: Negative.   Neurological: Negative.   Psychiatric/Behavioral: Negative.       Objective:   BP 120/65   Pulse 72   Wt 223 lb (101.2 kg)   BMI 37.69 kg/m   Wt Readings from Last 5 Encounters:  05/13/24 223 lb (101.2 kg)  05/03/24 222 lb 12.8 oz (101.1 kg)  04/10/24 223 lb (101.2 kg)  02/26/24 225 lb 6.4 oz (102.2 kg)  02/23/24 229 lb (103.9 kg)     Physical Exam Vitals and nursing note reviewed.  Constitutional:      General: She is not in acute distress.    Appearance: She is well-developed.  Cardiovascular:     Rate and Rhythm: Normal rate and regular rhythm.  Pulmonary:     Effort: Pulmonary effort is normal.     Breath sounds: Normal breath sounds.  Neurological:     Mental Status: She is alert and oriented to person, place, and time.       Assessment & Plan:    Type 2 diabetes mellitus with hyperglycemia, unspecified whether long term insulin  use (HCC) -     POCT glycosylated hemoglobin (Hb A1C) -     CBC -     Comprehensive metabolic panel with GFR -     Lipid panel  Elevated amylase -     Amylase -     Lipase     Return in about 3 months (around 08/13/2024).   Bascom GORMAN Borer, NP 05/13/2024

## 2024-05-14 ENCOUNTER — Ambulatory Visit

## 2024-05-14 LAB — COMPREHENSIVE METABOLIC PANEL WITH GFR
ALT: 22 IU/L (ref 0–32)
AST: 17 IU/L (ref 0–40)
Albumin: 4.2 g/dL (ref 3.8–4.9)
Alkaline Phosphatase: 138 IU/L — ABNORMAL HIGH (ref 49–135)
BUN/Creatinine Ratio: 17 (ref 9–23)
BUN: 16 mg/dL (ref 6–24)
Bilirubin Total: 0.5 mg/dL (ref 0.0–1.2)
CO2: 27 mmol/L (ref 20–29)
Calcium: 9.8 mg/dL (ref 8.7–10.2)
Chloride: 102 mmol/L (ref 96–106)
Creatinine, Ser: 0.95 mg/dL (ref 0.57–1.00)
Globulin, Total: 2.7 g/dL (ref 1.5–4.5)
Glucose: 156 mg/dL — ABNORMAL HIGH (ref 70–99)
Potassium: 5 mmol/L (ref 3.5–5.2)
Sodium: 141 mmol/L (ref 134–144)
Total Protein: 6.9 g/dL (ref 6.0–8.5)
eGFR: 70 mL/min/1.73 (ref >59)

## 2024-05-14 LAB — AMYLASE: Amylase: 107 U/L (ref 31–110)

## 2024-05-14 LAB — CBC
Hematocrit: 39.9 % (ref 34.0–46.6)
Hemoglobin: 13.1 g/dL (ref 11.1–15.9)
MCH: 29.2 pg (ref 26.6–33.0)
MCHC: 32.8 g/dL (ref 31.5–35.7)
MCV: 89 fL (ref 79–97)
Platelets: 242 x10E3/uL (ref 150–450)
RBC: 4.48 x10E6/uL (ref 3.77–5.28)
RDW: 12.4 % (ref 11.7–15.4)
WBC: 6.8 x10E3/uL (ref 3.4–10.8)

## 2024-05-14 LAB — LIPID PANEL
Chol/HDL Ratio: 2.4 ratio (ref 0.0–4.4)
Cholesterol, Total: 152 mg/dL (ref 100–199)
HDL: 63 mg/dL (ref >39)
LDL Chol Calc (NIH): 71 mg/dL (ref 0–99)
Triglycerides: 100 mg/dL (ref 0–149)
VLDL Cholesterol Cal: 18 mg/dL (ref 5–40)

## 2024-05-14 LAB — LIPASE: Lipase: 41 U/L (ref 14–72)

## 2024-05-15 ENCOUNTER — Ambulatory Visit: Payer: Self-pay | Admitting: Nurse Practitioner

## 2024-05-16 ENCOUNTER — Ambulatory Visit
Admission: RE | Admit: 2024-05-16 | Discharge: 2024-05-16 | Disposition: A | Discharge status: Home / Self Care | Source: Ambulatory Visit | Attending: Nurse Practitioner | Admitting: Nurse Practitioner

## 2024-05-16 DIAGNOSIS — R1011 Right upper quadrant pain: Secondary | ICD-10-CM

## 2024-05-17 ENCOUNTER — Telehealth (HOSPITAL_COMMUNITY): Payer: Self-pay

## 2024-05-17 NOTE — Telephone Encounter (Signed)
 Called to confirm/remind patient of their appointment at the [facility] on 05/20/24.   Appointment:   [x] Confirmed  [] Left mess   [] No answer/No voice mail  [] VM Full/unable to leave message  [] Phone not in service  Patient reminded to bring all medications and/or complete list.  Confirmed patient has transportation. Gave directions, instructed to utilize valet parking.

## 2024-05-17 NOTE — Progress Notes (Signed)
 [facility] NOTE   PCP: Oley Bascom RAMAN, NP HF Cardiologist: Dr. Cherrie  HPI: Ms Botz is a 58 y.o. with a history of HTN, obesity, and NICM/systolic heart failure with recovered EF.   Admitted 5/21 with new onset HF. Echo EF 35-40%. RV moderately HK.  R/LHC with minimal CAD, elevated filling pressures and preserved cardiac output. Diuresed with IV lasix  and transitioned to torsemide  20 mg daily. Also concern for sleep apnea.   Echo 05/11/20: EF 55% RV ok.   Admitted 1/22 with respiratory failure due to COVID PNA. Was in hospital for 17 days and discharged on O2.   Seen in AHF clinic 2/22 and doing well. Echo (4/22) showed EF stable 55-60%, mild LVH, grade I DD, RV ok. She was then graduated from [facility] clinic and referred to [facility] Dr. Rolan.  Acute visit 09/01/21 for fatigue. She had stopped all HF meds for 4 days. Labs at visit showed blood glucose > 600 and advised to go to ED for evaluation, however she declined and went to PCP. CBC, iron studies and TSH normal. Suspect symptoms related to uncontrolled blood sugars, and OSA.  Graduated from AHF clinic 08/2021.  Seen in ED 11/12/23 with CP. HsTroponins negative, ECG without acute changes. Felt symptoms likely MSK and discharged home.  Seen in HF Clinic on 5/258/25 as referral back from Pulmonary. Repeat echo ordered.   Echo 7/25 showed EF 50%, normal RV. CTA arranged due to atypical CP, which showed showed minimal CAD, Coronary calcium  score 43 (89th %)  Today she returns for HF follow up. Overall feeling fine.She does not have undue dyspnea with ADLs. Left ankle is swelling, not taking PRN torsemide  bc of cramping. Wearing right ortho boot. Continues with chest tightness with activity and inspiration, worse in AM. Denies palpitations, abnormal bleeding, dizziness, or PND/Orthopnea. Appetite ok. Weight at home 222 pounds. Taking all medications. Now wearing CPAP.    Cardiac Studies: - Echo 7/25: EF 50%, normal RV.   -  Echo (4/22): EF stable 55-60%, mild LVH, grade I DD, RV ok.  - Echo (8/21): EF 55%, RV ok.  - R/LHC 5/21 Ao = 131/86 (109) LV = 138/29 RA =   9 RV = 58/15 PA = 63/23 (38) PCW = 21 Fick cardiac output/index = 5.2/2.4 PVR = 3.2 WU FA sat = 93% PA sat = 62%, 67%  ABG: 7.27/88/81/93%   1. 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 Severe NICM EF 25% suspect due to HTN and undiagnosed OSA 3. Elevated filling pressures with normal cardiac output 4. OHS/OSA with CO2 retention (patient received 2mg  versed  and 25mcg fentanyl  during cath)  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) (HCC)    Cataract    not a surgical candidate at this time (02/03/2023)   Chest pain of uncertain etiology, non obstructive CAD, pain due to acute HF 12/12/2019   CHF (congestive heart failure) (HCC)    Diabetes mellitus without complication (HCC)    Diabetic peripheral neuropathy (HCC)    bilateral feet   Heart murmur    childhood   Hyperlipidemia    on meds   Hypertension    on meds   Hypoventilation associated with obesity (HCC) 12/12/2019   Morbid obesity (HCC)    NICM (nonischemic cardiomyopathy) (HCC)    OSA (obstructive sleep apnea) 12/12/2019   uses CPAP   Pulmonary hypertension, unspecified (HCC) 12/12/2019   Current Outpatient Medications  Medication Sig  Dispense Refill   aspirin  81 MG EC tablet Take 1 tablet (81 mg total) by mouth daily. 90 tablet 3   atorvastatin  (LIPITOR) 80 MG tablet Take 1 tablet (80 mg total) by mouth daily. 90 tablet 3   azelastine  (ASTELIN ) 0.1 % nasal spray Place 1 spray into both nostrils at bedtime as needed for rhinitis. 90 mL 3   carvedilol  (COREG ) 6.25 MG tablet Take 1 tablet (6.25 mg total) by mouth 2 (two) times daily. 60 tablet 3   ciclopirox  (PENLAC ) 8 % solution Apply topically at bedtime. Apply over nail and surrounding skin. Apply daily over previous coat. After seven (7) days, may remove with  alcohol and continue cycle. 6.6 mL 2   diclofenac  (VOLTAREN ) 75 MG EC tablet Take 1 tablet (75 mg total) by mouth 2 (two) times daily. 50 tablet 2   docusate sodium  (COLACE) 100 MG capsule Take 1 capsule (100 mg total) by mouth 2 (two) times daily. 10 capsule 0   Dulaglutide  (TRULICITY ) 3 MG/0.5ML SOAJ Inject 3 mg into the skin once a week. 6 mL 3   Elastic Bandages & Supports (WRIST SPLINT/COCK-UP/LEFT M) MISC Wear nightly 1 each 0   Elastic Bandages & Supports (WRIST SPLINT/COCK-UP/RIGHT M) MISC Wear nightly 1 each 0   empagliflozin  (JARDIANCE ) 10 MG TABS tablet Take 1 tablet (10 mg total) by mouth daily before breakfast. 90 tablet 3   fluticasone (FLONASE) 50 MCG/ACT nasal spray Place 2 sprays into both nostrils daily as needed for allergies or rhinitis. 48 g 3   gabapentin  (NEURONTIN ) 100 MG capsule Take 1 capsule (100 mg total) by mouth 3 (three) times daily. 90 capsule 3   glucose blood (TRUE METRIX BLOOD GLUCOSE TEST) test strip Use as instructed 100 each 12   latanoprost  (XALATAN ) 0.005 % ophthalmic solution Place 1 drop into both eyes at bedtime. 10 mL 3   loratadine (CLARITIN) 10 MG tablet Take 1 tablet (10 mg total) by mouth daily as needed for allergies. 90 tablet 3   Multiple Vitamins-Minerals (ZINC  PO) Take 1 tablet by mouth daily at 6 (six) AM.     omeprazole (PRILOSEC) 20 MG capsule Take 1 capsule (20 mg total) by mouth daily. 30 capsule 3   polyethylene glycol (MIRALAX  / GLYCOLAX ) 17 g packet Take 17 g by mouth daily as needed (constipation). 14 each 0   potassium chloride  SA (KLOR-CON  M) 20 MEQ tablet Take 1 tablet (20 mEq total) by mouth daily as needed. Take 1 tablet (20meq) with torsemide  if needed 30 tablet 3   sacubitril -valsartan  (ENTRESTO ) 49-51 MG Take 1 tablet by mouth 2 (two) times daily. 180 tablet 3   spironolactone  (ALDACTONE ) 25 MG tablet Take 1 tablet (25 mg total) by mouth daily. NEEDS FOLLOW UP FOR ANYMORE REFILLS 30 tablet 5   torsemide  (DEMADEX ) 20 MG tablet  Take 1 tablet (20 mg total) by mouth daily as needed. Take 1 tablet (20mg ) daily as needed- please take 1 tablet (20meq) of potassium with this if needed 30 tablet 3   TRUEplus Lancets 28G MISC Use as directed in the morning and at bedtime. 100 each 1   Vitamin D -Vitamin K (K2 PLUS D3 PO) Take 1 tablet by mouth daily.     No current facility-administered medications for this encounter.   Allergies  Allergen Reactions   Hyzaar [Losartan Potassium-Hctz] Nausea And Vomiting and Cough    Patient can take Entresto  now   Social History   Socioeconomic History   Marital status: Married  Spouse name: Not on file   Number of children: Not on file   Years of education: Not on file   Highest education level: Not on file  Occupational History   Not on file  Tobacco Use   Smoking status: Never   Smokeless tobacco: Never  Vaping Use   Vaping status: Never Used  Substance and Sexual Activity   Alcohol use: No   Drug use: No   Sexual activity: Yes    Birth control/protection: None  Other Topics Concern   Not on file  Social History Narrative   Right handed   Social Drivers of Health   Financial Resource Strain: High Risk (12/06/2023)   Overall Financial Resource Strain (CARDIA)    Difficulty of Paying Living Expenses: Very hard  Food Insecurity: Patient Declined (12/06/2023)   Hunger Vital Sign    Worried About Running Out of Food in the Last Year: Patient declined    Ran Out of Food in the Last Year: Patient declined  Recent Concern: Food Insecurity - Food Insecurity Present (11/14/2023)   Hunger Vital Sign    Worried About Running Out of Food in the Last Year: Sometimes true    Ran Out of Food in the Last Year: Sometimes true  Transportation Needs: No Transportation Needs (11/14/2023)   PRAPARE - Administrator, Civil Service (Medical): No    Lack of Transportation (Non-Medical): No  Physical Activity: Not on file  Stress: Not on file  Social Connections: Not on file   Intimate Partner Violence: Not on file   Family History  Problem Relation Age of Onset   Colon polyps Mother 30   Breast cancer Mother 17   Hyperlipidemia Mother    Diabetes Father    Hypertension Father    Hyperlipidemia Sister    Hypertension Sister    Hyperlipidemia Brother    Hypertension Brother    Cancer Maternal Grandmother    Stroke Maternal Grandmother    Esophageal cancer Maternal Grandfather 32   Colon cancer Maternal Grandfather 72   Colon polyps Maternal Grandfather 72   Cancer Maternal Grandfather    Diabetes Maternal Grandfather    Heart disease Maternal Grandfather    Hypertension Maternal Grandfather    Stomach cancer Paternal Grandmother 64   Diabetes Paternal Grandmother    Hypertension Paternal Grandmother    Cancer Paternal Grandfather    Hypertension Paternal Grandfather    Stroke Paternal Grandfather    Rectal cancer Neg Hx    BP 124/80   Pulse 69   Wt 103.4 kg (228 lb)   SpO2 95%   BMI 38.53 kg/m   Wt Readings from Last 3 Encounters:  05/20/24 103.4 kg (228 lb)  05/13/24 101.2 kg (223 lb)  05/03/24 101.1 kg (222 lb 12.8 oz)   PHYSICAL EXAM: General:  NAD. No resp difficulty, arrived in [facility] HEENT: Normal Neck: Supple. No JVD. Cor: Regular rate & rhythm. No rubs, gallops or murmurs. Lungs: Clear Abdomen: Soft, obese, nontender, nondistended.  Extremities: No cyanosis, clubbing, rash, edema; R ortho boot Neuro: Alert & oriented x 3, moves all 4 extremities w/o difficulty. Affect pleasant.  ECG (personally reviewed): NSR 61 bpm  ReDs reading: 34%, normal  ASSESSMENT & PLAN: 1. Chronic Systolic Heart Failure, NICM (likely hypertensive) with recovered EF - Echo 11/2019 EF 35-40% RV moderately reduced.  - Cath 11/2019 with minimal cardiac disease and preserved cardiac output. EF on cath 25%.  - Echo 05/11/20: EF 55% RV ok - Echo  4/22: EF 55-60%, mild LVH, grade I DD, RV ok - Echo 7/25: EF 50%, normal RV.  - cMRI has been arranged. - NYHA  I-II Stable Volume ok ReDs 34% - Continue torsemide  20 mg PRN/20 KCL PRN - Continue carvedilol  6.25 mg bid.  - Continue Entresto  49/51 mg bid. - Continue spironolactone  25 mg daily. - Continue Jardiance  10 mg daily. - Labs reviewed from 05/13/24 and are stable, K 5.0, SCr 0.95  2. CAD - LHC 5/21: prox LAD 20% - Coronary CT 8/25: no significant CAD - Continue ASA 81 mg daily - Continue atorvastatin  80 mg daily. - Continues with episodes of pleuritic CP. ECG today without acute ST-T changes - Check ESR and CRP  3. HTN - Blood pressure well controlled.  - Continue current regimen.  4. Obesity  - Body mass index is 38.53 kg/m. - Continue weight loss efforts.   5. OSA - Compliant with CPAP  6. DM II - A1c15>>8 - Followed by PCP.  Follow up in 6 months with Dr. Bensimhon  Adiya Selmer M Marylou Wages, FNP  8:58 AM

## 2024-05-20 ENCOUNTER — Ambulatory Visit (HOSPITAL_COMMUNITY)
Admission: RE | Admit: 2024-05-20 | Discharge: 2024-05-20 | Disposition: A | Discharge status: Home / Self Care | Source: Ambulatory Visit | Attending: Family Medicine | Admitting: Family Medicine

## 2024-05-20 ENCOUNTER — Ambulatory Visit (HOSPITAL_COMMUNITY): Payer: Self-pay | Admitting: Family Medicine

## 2024-05-20 ENCOUNTER — Encounter (HOSPITAL_COMMUNITY): Payer: Self-pay

## 2024-05-20 VITALS — BP 124/80 | HR 69 | Wt 228.0 lb

## 2024-05-20 DIAGNOSIS — Z833 Family history of diabetes mellitus: Secondary | ICD-10-CM | POA: Insufficient documentation

## 2024-05-20 DIAGNOSIS — Z6838 Body mass index (BMI) 38.0-38.9, adult: Secondary | ICD-10-CM | POA: Diagnosis not present

## 2024-05-20 DIAGNOSIS — G4733 Obstructive sleep apnea (adult) (pediatric): Secondary | ICD-10-CM | POA: Diagnosis not present

## 2024-05-20 DIAGNOSIS — Z7984 Long term (current) use of oral hypoglycemic drugs: Secondary | ICD-10-CM | POA: Insufficient documentation

## 2024-05-20 DIAGNOSIS — Z8616 Personal history of COVID-19: Secondary | ICD-10-CM | POA: Insufficient documentation

## 2024-05-20 DIAGNOSIS — I5042 Chronic combined systolic (congestive) and diastolic (congestive) heart failure: Secondary | ICD-10-CM

## 2024-05-20 DIAGNOSIS — I5022 Chronic systolic (congestive) heart failure: Secondary | ICD-10-CM | POA: Insufficient documentation

## 2024-05-20 DIAGNOSIS — Z59869 Financial insecurity, unspecified: Secondary | ICD-10-CM | POA: Diagnosis not present

## 2024-05-20 DIAGNOSIS — Z7985 Long-term (current) use of injectable non-insulin antidiabetic drugs: Secondary | ICD-10-CM | POA: Insufficient documentation

## 2024-05-20 DIAGNOSIS — I25119 Atherosclerotic heart disease of native coronary artery with unspecified angina pectoris: Secondary | ICD-10-CM

## 2024-05-20 DIAGNOSIS — E119 Type 2 diabetes mellitus without complications: Secondary | ICD-10-CM | POA: Insufficient documentation

## 2024-05-20 DIAGNOSIS — Z79899 Other long term (current) drug therapy: Secondary | ICD-10-CM | POA: Diagnosis not present

## 2024-05-20 DIAGNOSIS — I10 Essential (primary) hypertension: Secondary | ICD-10-CM

## 2024-05-20 DIAGNOSIS — E669 Obesity, unspecified: Secondary | ICD-10-CM | POA: Diagnosis not present

## 2024-05-20 DIAGNOSIS — I2510 Atherosclerotic heart disease of native coronary artery without angina pectoris: Secondary | ICD-10-CM | POA: Diagnosis not present

## 2024-05-20 DIAGNOSIS — I110 Hypertensive heart disease with heart failure: Secondary | ICD-10-CM | POA: Insufficient documentation

## 2024-05-20 DIAGNOSIS — Z7982 Long term (current) use of aspirin: Secondary | ICD-10-CM | POA: Diagnosis not present

## 2024-05-20 DIAGNOSIS — Z8249 Family history of ischemic heart disease and other diseases of the circulatory system: Secondary | ICD-10-CM | POA: Diagnosis not present

## 2024-05-20 LAB — C-REACTIVE PROTEIN: CRP: 0.6 mg/dL (ref <1.0)

## 2024-05-20 LAB — SEDIMENTATION RATE: Sed Rate: 21 mm/h (ref 0–22)

## 2024-05-20 NOTE — Patient Instructions (Addendum)
 Thank you for coming in today  If you had labs drawn today, any labs that are abnormal the clinic will call you No news is good news  You were scheduled for your Cardiac MRI on October 31st, 2025 at 1:30 pm  Medications: No changes  Follow up appointments:  Your physician recommends that you schedule a follow-up appointment in:  6 months With Dr. Bensimhon    Do the following things EVERYDAY: Weigh yourself in the morning before breakfast. Write it down and keep it in a log. Take your medicines as prescribed Eat low salt foods--Limit salt (sodium) to 2000 mg per day.  Stay as active as you can everyday Limit all fluids for the day to less than 2 liters   At the [facility], you and your health needs are our priority. As part of our continuing mission to provide you with exceptional heart care, we have created designated Provider Care Teams. These Care Teams include your primary Cardiologist (physician) and Advanced Practice Providers (APPs- Physician Assistants and Nurse Practitioners) who all work together to provide you with the care you need, when you need it.   You may see any of the following providers on your designated Care Team at your next follow up: Dr Toribio Fuel Dr Ezra Shuck Dr. Ria Gardenia Greig Lenetta, NP Caffie Shed, [place] [facility] [place], [place] Beckey Coe, NP Tinnie Redman, PharmD   Please be sure to bring in all your medications bottles to every appointment.    Thank you for choosing [facility]  If you have any questions or concerns before your next appointment please send us  a message through [place] or call our office at [phone].    TO LEAVE A MESSAGE FOR THE NURSE SELECT OPTION 2, PLEASE LEAVE A MESSAGE INCLUDING: YOUR NAME DATE OF BIRTH CALL BACK NUMBER REASON FOR CALL**this is important as we prioritize the call backs  YOU WILL RECEIVE A CALL BACK THE SAME  DAY AS LONG AS YOU CALL BEFORE 4:00 PM

## 2024-05-20 NOTE — Progress Notes (Signed)
 ReDS Vest / Clip - 05/20/24 0900       ReDS Vest / Clip   Station Marker B    Ruler Value 43    ReDS Value Range Low volume    ReDS Actual Value 34

## 2024-05-22 ENCOUNTER — Encounter (HOSPITAL_COMMUNITY): Payer: Self-pay

## 2024-05-23 ENCOUNTER — Ambulatory Visit: Admitting: Physical Therapy

## 2024-05-23 DIAGNOSIS — M79671 Pain in right foot: Secondary | ICD-10-CM

## 2024-05-23 DIAGNOSIS — M722 Plantar fascial fibromatosis: Secondary | ICD-10-CM

## 2024-05-23 NOTE — Therapy (Signed)
 OUTPATIENT PHYSICAL THERAPY LOWER EXTREMITY TREATMENT   Patient Name: Maria Duncan MRN: [id number] DOB:1966/01/21, 58 y.o., female Today's Date: 05/23/2024  END OF SESSION:  PT End of Session - 05/23/24 1019     Visit Number 10    Number of Visits 27    Date for Recertification  06/18/24    Authorization Type [place] Medicaid    PT Start Time 1020    PT Stop Time 1100    PT Time Calculation (min) 40 min           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) (HCC)    Cataract    not a surgical candidate at this time (02/03/2023)   Chest pain of uncertain etiology, non obstructive CAD, pain due to acute HF 12/12/2019   CHF (congestive heart failure) (HCC)    Diabetes mellitus without complication (HCC)    Diabetic peripheral neuropathy (HCC)    bilateral feet   Heart murmur    childhood   Hyperlipidemia    on meds   Hypertension    on meds   Hypoventilation associated with obesity (HCC) 12/12/2019   Morbid obesity (HCC)    NICM (nonischemic cardiomyopathy) (HCC)    OSA (obstructive sleep apnea) 12/12/2019   uses CPAP   Pulmonary hypertension, unspecified (HCC) 12/12/2019   Past Surgical History:  Procedure Laterality Date   BREAST BIOPSY Left    per pt benign   CESAREAN SECTION  1986   PARTIAL HYSTERECTOMY     RIGHT/LEFT HEART CATH AND CORONARY ANGIOGRAPHY N/A 12/09/2019   Procedure: RIGHT/LEFT HEART CATH AND CORONARY ANGIOGRAPHY;  Surgeon: Cherrie Toribio SAUNDERS, MD;  Location: [facility];  Service: Cardiovascular;  Laterality: N/A;   UTERINE FIBROID SURGERY     WISDOM TOOTH EXTRACTION     Patient Active Problem List   Diagnosis Date Noted   Chest pain 12/14/2023   Colon cancer screening 12/14/2022   Chronic respiratory failure with hypoxia (HCC) 09/21/2022   Type 2 diabetes mellitus with hyperglycemia (HCC) 03/25/2022   Vitamin D  deficiency 08/27/2020   Hyperglycemia 08/07/2020   Pneumonia due to  COVID-19 virus 07/28/2020   Acute hypoxemic respiratory failure due to COVID-19 (HCC) 07/27/2020   Combined congestive systolic and diastolic heart failure (HCC) 07/27/2020   Acute on chronic combined systolic and diastolic CHF (congestive heart failure) (HCC) 01/24/2020   Chest pain of uncertain etiology, non obstructive CAD, pain due to acute HF 12/12/2019   Pulmonary hypertension, unspecified (HCC) 12/12/2019   OSA (obstructive sleep apnea) 12/12/2019   Hypoventilation associated with obesity (HCC) 12/12/2019   CAD in native artery non obstructive on cath 12/09/19 12/12/2019   NICM (nonischemic cardiomyopathy) (HCC)    Acute combined systolic and diastolic HF (heart failure) (HCC)    Morbid obesity (HCC)    CHF exacerbation (HCC) 12/07/2019   Cardiomegaly 12/07/2019   Essential hypertension 12/07/2019   CHF (congestive heart failure) (HCC) 12/07/2019    PCP: Bascom Borer  REFERRING PROVIDER: Donnice Fees  REFERRING DIAG:  [phone] (ICD-10-CM) - Posterior tibial tendon dysfunction (PTTD) of left lower extremity    THERAPY DIAG:  Bilateral foot pain  Plantar fasciitis, bilateral  Rationale for Evaluation and Treatment: Rehabilitation  ONSET DATE: a couple years  SUBJECTIVE:   SUBJECTIVE STATEMENT: been a rough month, in the hospital and lots of test and scan. A lot of cramping LE and chest   PERTINENT HISTORY: See above  PAIN:  Are you having pain? Yes: NPRS scale: 9/10 Pain location: both feet, ankles, back of calf sometimes Pain description: throbbing, needle like pinching in the ankle, constant  Aggravating factors: standing for long periods, walking at the park, stairs, shopping Relieving factors: soaking helps for a couple of hours, massage also helps for about 3 hrs    PRECAUTIONS: None  RED FLAGS: None   WEIGHT BEARING RESTRICTIONS: No  FALLS:  Has patient fallen in last 6 months? 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  DIAGNOSTIC FINDINGS: X-rays were clear   COGNITION: Overall cognitive status: Within functional limits for tasks assessed     SENSATION: WFL   MUSCLE LENGTH: Tightness in HS and calves  POSTURE: rounded shoulders  PALPATION: There is a decreased medial arch upon weightbearing bilaterally. Majority tenderness is localized along the course of the posterior tibial, flexor tendons with the right side worse than left.   Not able to appreciate any area pinpoint tenderness. Ankle, subtalar joint range of motion intact.   LOWER EXTREMITY ROM: All WFL but painful with all motions in ankles    LOWER EXTREMITY MMT: 5/5 BLE   FUNCTIONAL TESTS:  5 times sit to stand: 17.41s Timed up and go (TUG): 13.94s  GAIT: Distance walked: in clinic distances Assistive device utilized: None Level of assistance: Modified independence Comments: antalgic gait, walks with a limp due to pain in feet                                                                                                                                TREATMENT DATE:  05/23/24 STM BIL PF, Plantar Fascia and Peroneals Calf Stretching Joint Mobilizations to ankle, mid foot, and phalanges PROM and stretching BIL LE KT tape to BIL PF BIL DF Act 8   05/06/24 Paraffin BIL Feet STM BIL PF, Plantar Fascia and Peroneals Calf Stretching Joint Mobilizations to ankle, mid foot, and phalanges  04/18/24 BIL DF 15 Paraffin BIL feet STM to the PF bilaterally Joint mobs ankle mid foot and phalanges Calf  stretching   04/16/24 Paraffin BIL feet STM to the PF bilaterally Joint mobs ankle mid foot and phalanges Calf stretching  04/11/24 L 4 Rocker board  Dyna disc standing WBAT Black bar DF and PF STM to the PF bilaterally Joint mobs mid foot and phalanges Ktape 3 I strips, PF, posterior tib and arch    04/09/24 Nustep level 4 x 6 minutes Slant board calf stretch On airex standing and weight shifts  On airex cone toe touches Reviewed HEP STM to the PF bilaterally Joint mobs mid foot and phalanges Ktape 3 I strips, PF, posterior tib and arch   04/04/24 STM ( with and without instrument assist)to the bilateral feet, passive stretch, joint mobs of the mid foot and ankle Ktape one piece to support PF, one piece to support posterior tibialis and then one to support arch  04/02/24 STM to the bilateral feet, passive stretch, joint mobs of the mid foot and ankle Ktape one piece to support PF, one piece to support posterior tibialis and then one to support arch   03/29/24 Nustep level 4 x 4 minutes Slant board stretch Discussion on orthotics and where to go and what to look for Reviewed HEP STM to the bilateral feet, passive stretch, joint mobs of the mid foot Ktape one piece to support PF, one piece to support posterior tibialis and then one to support arch   03/26/24- EVAL, HEP    PATIENT EDUCATION:  Education details: POC, HEP, foot anatomy, pain relief tools such as massage, stretching, etc Person educated: Patient and Spouse Education method: Medical Illustrator Education comprehension: verbalized understanding, returned demonstration, and needs further education  HOME EXERCISE PROGRAM: Access Code: [facility] URL: [email or web address] Date: 03/26/2024 Prepared by: Almetta Fam  Exercises - Gastroc Stretch on Wall  - 1 x daily - 7 x weekly - 2 reps - 30 hold - Heel Raises with Counter Support  - 1 x daily - 7 x weekly - 2 sets - 10 reps - Long  Sitting Calf Stretch with Strap  - 1 x daily - 7 x weekly - 2 reps - 30 hold - Standing Plantar Fascia Mobilization with Small Ball  - 1 x daily - 7 x weekly - Towel Scrunches  - 1 x daily - 7 x weekly - 3 sets - 10 reps - Seated Plantar Fascia Stretch  - 1 x daily - 7 x weekly - 2 reps - 30 hold  ASSESSMENT:  CLINICAL IMPRESSION: pnt arrives after rough month of illness ,appts and scans for muscle spasms/cramping. States she is using wax at home requested STW and KT tape and she responded well.less TP in left vs RT and overall less than initial.   OBJECTIVE IMPAIRMENTS: Abnormal gait, decreased activity tolerance, decreased balance, difficulty walking, increased fascial restrictions, impaired flexibility, and pain.   ACTIVITY LIMITATIONS: bending, squatting, stairs, and locomotion level  PARTICIPATION LIMITATIONS: cleaning, laundry, driving, shopping, community activity, occupation, and yard work  PERSONAL FACTORS: Age, Past/current experiences, and Time since onset of injury/illness/exacerbation are also affecting patient's functional outcome.   REHAB POTENTIAL: Good  CLINICAL DECISION MAKING: Stable/uncomplicated  EVALUATION COMPLEXITY: Low   GOALS: Goals reviewed with patient? Yes  SHORT TERM GOALS: Target date: 05/07/24  Patient will be independent with initial HEP. Baseline:  Goal status: 04/02/24 MET  2.  Patient will report at least 50% improvement in bilateral ankle/foot pain to improve QOL. (5/10) Baseline: 10/10  Goal status: ongoing 04/09/24  and 04/18/24    LONG TERM GOALS: Target date: 06/18/24  Patient will be independent with advanced/ongoing HEP to improve outcomes and carryover.  Baseline:  Goal status: evolving 05/23/24  2.  Patient will report at least 75% improvement in bilateral ankle/foot pain to improve QOL. (2/10) Baseline: 10/10 at eval; 6/10 end of session 10/13 Goal status: progressing 05/23/24  3.  Patient will be able to ambulate 600' with  normal gait pattern without increased foot/ankle pain to access community.  Baseline: antalgic gait; progressing 10/13 (still antalgic) Goal status: 05/23/24 antalgic   4. Patient will be able to ascend/descend stairs with 1 HR and reciprocal step pattern safely to access home and community.  Baseline: step to with rails Goal status: INITIAL  5.  Patient will be able to walk, dance, and do a light jog without pain. Baseline: stopped doing all of these d/t pain Goal status: on going 04/18/24 tried to dance but added to set back this week   PLAN:  PT FREQUENCY: 2x/week  PT DURATION: 12 weeks  PLANNED INTERVENTIONS: 97110-Therapeutic exercises, 97530- Therapeutic activity, 97112- Neuromuscular re-education, 97535- Self Care, 02859- Manual therapy, [phone]- Gait training, [phone]- Electrical stimulation (unattended), 97035- Ultrasound, 02966- Ionotophoresis 4mg /ml Dexamethasone , 79439 (1-2 muscles), 20561 (3+ muscles)- Dry Needling, Patient/Family education, Balance training, Stair training, Taping, Joint mobilization, Cryotherapy, and Moist heat  PLAN FOR NEXT SESSION: STW to bilateral PF, calf stretch, calf raises,    Ludwin Flahive,ANGIE, PTA, 05/23/2024, 10:19 AM

## 2024-05-24 ENCOUNTER — Other Ambulatory Visit: Payer: Self-pay

## 2024-05-24 ENCOUNTER — Ambulatory Visit (HOSPITAL_COMMUNITY): Admission: RE | Admit: 2024-05-24 | Source: Ambulatory Visit

## 2024-05-28 ENCOUNTER — Ambulatory Visit: Attending: Podiatry | Admitting: Physical Therapy

## 2024-05-28 ENCOUNTER — Encounter: Payer: Self-pay | Admitting: Physical Therapy

## 2024-05-28 DIAGNOSIS — M79671 Pain in right foot: Secondary | ICD-10-CM | POA: Diagnosis present

## 2024-05-28 DIAGNOSIS — M722 Plantar fascial fibromatosis: Secondary | ICD-10-CM | POA: Diagnosis present

## 2024-05-28 DIAGNOSIS — M79672 Pain in left foot: Secondary | ICD-10-CM | POA: Diagnosis present

## 2024-05-28 DIAGNOSIS — R2689 Other abnormalities of gait and mobility: Secondary | ICD-10-CM | POA: Insufficient documentation

## 2024-05-28 NOTE — Therapy (Signed)
 OUTPATIENT PHYSICAL THERAPY LOWER EXTREMITY TREATMENT   Patient Name: JILIAN WEST MRN: [id number] DOB:05-Dec-1965, 58 y.o., female Today's Date: 05/28/2024  END OF SESSION:  PT End of Session - 05/28/24 0759     Visit Number 11    Number of Visits 27    Date for Recertification  06/18/24    Authorization Type [place] Medicaid    PT Start Time 0756    PT Stop Time 0842    PT Time Calculation (min) 46 min    Activity Tolerance Patient tolerated treatment well;Patient limited by pain    Behavior During Therapy [facility] for tasks assessed/performed           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) (HCC)    Cataract    not a surgical candidate at this time (02/03/2023)   Chest pain of uncertain etiology, non obstructive CAD, pain due to acute HF 12/12/2019   CHF (congestive heart failure) (HCC)    Diabetes mellitus without complication (HCC)    Diabetic peripheral neuropathy (HCC)    bilateral feet   Heart murmur    childhood   Hyperlipidemia    on meds   Hypertension    on meds   Hypoventilation associated with obesity (HCC) 12/12/2019   Morbid obesity (HCC)    NICM (nonischemic cardiomyopathy) (HCC)    OSA (obstructive sleep apnea) 12/12/2019   uses CPAP   Pulmonary hypertension, unspecified (HCC) 12/12/2019   Past Surgical History:  Procedure Laterality Date   BREAST BIOPSY Left    per pt benign   CESAREAN SECTION  1986   PARTIAL HYSTERECTOMY     RIGHT/LEFT HEART CATH AND CORONARY ANGIOGRAPHY N/A 12/09/2019   Procedure: RIGHT/LEFT HEART CATH AND CORONARY ANGIOGRAPHY;  Surgeon: Cherrie Toribio SAUNDERS, MD;  Location: [facility];  Service: Cardiovascular;  Laterality: N/A;   UTERINE FIBROID SURGERY     WISDOM TOOTH EXTRACTION     Patient Active Problem List   Diagnosis Date Noted   Chest pain 12/14/2023   Colon cancer screening 12/14/2022   Chronic respiratory failure with hypoxia (HCC) 09/21/2022   Type  2 diabetes mellitus with hyperglycemia (HCC) 03/25/2022   Vitamin D  deficiency 08/27/2020   Hyperglycemia 08/07/2020   Pneumonia due to COVID-19 virus 07/28/2020   Acute hypoxemic respiratory failure due to COVID-19 (HCC) 07/27/2020   Combined congestive systolic and diastolic heart failure (HCC) 07/27/2020   Acute on chronic combined systolic and diastolic CHF (congestive heart failure) (HCC) 01/24/2020   Chest pain of uncertain etiology, non obstructive CAD, pain due to acute HF 12/12/2019   Pulmonary hypertension, unspecified (HCC) 12/12/2019   OSA (obstructive sleep apnea) 12/12/2019   Hypoventilation associated with obesity (HCC) 12/12/2019   CAD in native artery non obstructive on cath 12/09/19 12/12/2019   NICM (nonischemic cardiomyopathy) (HCC)    Acute combined systolic and diastolic HF (heart failure) (HCC)    Morbid obesity (HCC)    CHF exacerbation (HCC) 12/07/2019   Cardiomegaly 12/07/2019   Essential hypertension 12/07/2019   CHF (congestive heart failure) (HCC) 12/07/2019    PCP: Bascom Borer  REFERRING PROVIDER: Donnice Fees  REFERRING DIAG:  [phone] (ICD-10-CM) - Posterior tibial tendon dysfunction (PTTD) of left lower extremity    THERAPY DIAG:  Bilateral foot pain  Plantar fasciitis, bilateral  Other abnormalities of gait and mobility  Rationale for Evaluation and Treatment: Rehabilitation  ONSET DATE: a couple years  SUBJECTIVE:   SUBJECTIVE STATEMENT:  I am doing a little better over the past week. She reports that she is going to have further testing regarding the chest pain and issues   PERTINENT HISTORY: See above  PAIN:  Are you having pain? Yes: NPRS scale: 9/10 Pain location: both feet, ankles, back of calf sometimes Pain description: throbbing, needle like pinching in the ankle, constant  Aggravating factors: standing for long periods, walking at the park, stairs, shopping Relieving factors: soaking helps for a couple of hours,  massage also helps for about 3 hrs    PRECAUTIONS: None  RED FLAGS: None   WEIGHT BEARING RESTRICTIONS: No  FALLS:  Has patient fallen in last 6 months? 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  DIAGNOSTIC FINDINGS: X-rays were clear   COGNITION: Overall cognitive status: Within functional limits for tasks assessed     SENSATION: WFL   MUSCLE LENGTH: Tightness in HS and calves  POSTURE: rounded shoulders  PALPATION: There is a decreased medial arch upon weightbearing bilaterally. Majority tenderness is localized along the course of the posterior tibial, flexor tendons with the right side worse than left.   Not able to appreciate any area pinpoint tenderness. Ankle, subtalar joint range of motion intact.   LOWER EXTREMITY ROM: All WFL but painful with all motions in ankles    LOWER EXTREMITY MMT: 5/5 BLE   FUNCTIONAL TESTS:  5 times sit to stand: 17.41s Timed up and go (TUG): 13.94s  GAIT: Distance walked: in clinic distances Assistive device utilized: None Level of assistance: Modified independence Comments: antalgic gait, walks with a limp due to pain in feet                                                                                                                                TREATMENT DATE: 05/28/24 Nustep level 4 x 5 minutes Slant board stretch Seated dyna disc all motions Joint mobs toes, mid foot and ankle Passive stretch ankle, foot toes STM to above Ktape, PF support, post tib and mid foot support  05/23/24 STM BIL  PF, Plantar Fascia and Peroneals Calf Stretching Joint Mobilizations to ankle, mid foot, and phalanges PROM and stretching BIL LE KT tape to BIL PF BIL DF Act 8   05/06/24 Paraffin BIL Feet STM BIL PF, Plantar Fascia and Peroneals Calf Stretching Joint Mobilizations to ankle, mid foot, and phalanges  04/18/24 BIL DF 15 Paraffin BIL feet STM to the PF bilaterally Joint mobs ankle mid foot and phalanges Calf stretching   04/16/24  Paraffin BIL feet STM to the PF bilaterally Joint mobs ankle mid foot and phalanges Calf stretching  04/11/24 L 4 Rocker board  Dyna disc standing WBAT Black bar DF and PF STM to the PF bilaterally Joint mobs mid foot and phalanges Ktape 3 I strips, PF, posterior tib and arch    04/09/24 Nustep level 4 x 6 minutes Slant board calf stretch On airex standing and weight shifts On airex cone toe touches Reviewed HEP STM to the PF bilaterally Joint mobs mid foot and phalanges Ktape 3 I strips, PF, posterior tib and arch   04/04/24 STM ( with and without instrument assist)to the bilateral feet, passive stretch, joint mobs of the mid foot and ankle Ktape one piece to support PF, one piece to support posterior tibialis and then one to support arch  04/02/24 STM to the bilateral feet, passive stretch, joint mobs of the mid foot and ankle Ktape one piece to support PF, one piece to support posterior tibialis and then one to support arch   03/29/24 Nustep level 4 x 4 minutes Slant board stretch Discussion on orthotics and where to go and what to look for Reviewed HEP STM to the bilateral feet, passive stretch, joint mobs of the mid foot Ktape one piece to support PF, one piece to support posterior tibialis and then one to support arch   03/26/24- EVAL, HEP    PATIENT EDUCATION:  Education details: POC, HEP, foot anatomy, pain relief tools such as massage, stretching, etc Person educated: Patient and Spouse Education method: Software Engineer Education comprehension: verbalized understanding, returned demonstration, and needs further education  HOME EXERCISE PROGRAM: Access Code: [facility] URL: [email or web address] Date: 03/26/2024 Prepared by: Almetta Fam  Exercises - Gastroc Stretch on Wall  - 1 x daily - 7 x weekly - 2 reps - 30 hold - Heel Raises with Counter Support  - 1 x daily - 7 x weekly - 2 sets - 10 reps - Long Sitting Calf Stretch with Strap  - 1 x daily - 7 x weekly - 2 reps - 30 hold - Standing Plantar Fascia Mobilization with Small Ball  - 1 x daily - 7 x weekly - Towel Scrunches  - 1 x daily - 7 x weekly - 3 sets - 10 reps - Seated Plantar Fascia Stretch  - 1 x daily - 7 x weekly - 2 reps - 30 hold  ASSESSMENT:  CLINICAL IMPRESSION:  Patint reports that since starting back with us  she is doing better, feels like she is on her feet a lot so the feet never get to rest, thinks that the STM is the best for her, talked about inserts and shoes and soft mats to stand on.  She remains tight and tender in the calves and the ant tib  pnt arrives after rough month of illness ,appts and scans for muscle spasms/cramping. States she is using wax at home requested STW and KT tape and she responded well.less TP in left vs RT and overall less than initial.   OBJECTIVE IMPAIRMENTS: Abnormal gait, decreased activity tolerance, decreased balance, difficulty walking, increased fascial restrictions, impaired flexibility, and pain.   ACTIVITY LIMITATIONS: bending, squatting, stairs, and locomotion level  PARTICIPATION LIMITATIONS: cleaning, laundry, driving, shopping, community activity, occupation, and yard work  PERSONAL FACTORS: Age, Past/current experiences, and Time since onset of injury/illness/exacerbation are also affecting patient's functional outcome.   REHAB POTENTIAL: Good  CLINICAL DECISION MAKING: Stable/uncomplicated  EVALUATION COMPLEXITY: Low   GOALS:  Goals reviewed with  patient? Yes  SHORT TERM GOALS: Target date: 05/07/24  Patient will be independent with initial HEP. Baseline:  Goal status: 04/02/24 MET  2.  Patient will report at least 50% improvement in bilateral ankle/foot pain to improve QOL. (5/10) Baseline: 10/10  Goal status: ongoing 04/09/24  and 04/18/24    LONG TERM GOALS: Target date: 06/18/24  Patient will be independent with advanced/ongoing HEP to improve outcomes and carryover.  Baseline:  Goal status: evolving 05/23/24  2.  Patient will report at least 75% improvement in bilateral ankle/foot pain to improve QOL. (2/10) Baseline: 10/10 at eval; 6/10 end of session 10/13 Goal status: progressing 05/23/24  3.  Patient will be able to ambulate 600' with normal gait pattern without increased foot/ankle pain to access community.  Baseline: antalgic gait; progressing 10/13 (still antalgic) Goal status: 05/23/24 antalgic   4. Patient will be able to ascend/descend stairs with 1 HR and reciprocal step pattern safely to access home and community.  Baseline: step to with rails Goal status: INITIAL  5.  Patient will be able to walk, dance, and do a light jog without pain. Baseline: stopped doing all of these d/t pain Goal status: on going 04/18/24 tried to dance but added to set back this week   PLAN:  PT FREQUENCY: 2x/week  PT DURATION: 12 weeks  PLANNED INTERVENTIONS: 97110-Therapeutic exercises, 97530- Therapeutic activity, 97112- Neuromuscular re-education, 97535- Self Care, 02859- Manual therapy, [phone]- Gait training, [phone]- Electrical stimulation (unattended), 97035- Ultrasound, 02966- Ionotophoresis 4mg /ml Dexamethasone , 79439 (1-2 muscles), 20561 (3+ muscles)- Dry Needling, Patient/Family education, Balance training, Stair training, Taping, Joint mobilization, Cryotherapy, and Moist heat  PLAN FOR NEXT SESSION: STW to bilateral PF, calf stretch, calf raises,    Colin Norment W, PT, 05/28/2024, 7:59 AM

## 2024-05-29 ENCOUNTER — Ambulatory Visit: Payer: Self-pay | Admitting: Nurse Practitioner

## 2024-05-30 ENCOUNTER — Ambulatory Visit

## 2024-06-04 ENCOUNTER — Ambulatory Visit: Admitting: Physical Therapy

## 2024-06-04 ENCOUNTER — Ambulatory Visit: Admitting: Gastroenterology

## 2024-06-04 ENCOUNTER — Ambulatory Visit

## 2024-06-04 ENCOUNTER — Encounter: Payer: Self-pay | Admitting: Gastroenterology

## 2024-06-04 NOTE — Progress Notes (Deleted)
 Maria Duncan [id number] May 12, 1966   Chief Complaint:  Referring Provider: Oley Bascom RAMAN, NP Primary GI MD: Sampson  HPI: Maria Duncan is a 58 y.o. female with past medical history of CHF, CAD, diabetes, HTN, HLD, morbid obesity, OSA on CPAP who presents today for a complaint of *** .    Referred by PCP for colonoscopy.  Labs 05/13/2024: Normal CBC, mild elevation in alk phos to 138 otherwise normal CMP, normal lipase, normal lipid panel  Normal sed rate and CRP on 05/20/2024.  Follows with cardiology, last visit 01/19/2024  Discussed the use of AI scribe software for clinical note transcription with the patient, who gave verbal consent to proceed.  History of Present Illness       Previous GI Procedures/Imaging   RUQ US  05/16/2024 No significant sonographic abnormality of the liver or gallbladder.    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) (HCC)    Cataract    not a surgical candidate at this time (02/03/2023)   Chest pain of uncertain etiology, non obstructive CAD, pain due to acute HF 12/12/2019   CHF (congestive heart failure) (HCC)    Diabetes mellitus without complication (HCC)    Diabetic peripheral neuropathy (HCC)    bilateral feet   Heart murmur    childhood   Hyperlipidemia    on meds   Hypertension    on meds   Hypoventilation associated with obesity (HCC) 12/12/2019   Morbid obesity (HCC)    NICM (nonischemic cardiomyopathy) (HCC)    OSA (obstructive sleep apnea) 12/12/2019   uses CPAP   Pulmonary hypertension, unspecified (HCC) 12/12/2019    Past Surgical History:  Procedure Laterality Date   BREAST BIOPSY Left    per pt benign   CESAREAN SECTION  1986   PARTIAL HYSTERECTOMY     RIGHT/LEFT HEART CATH AND CORONARY ANGIOGRAPHY N/A 12/09/2019   Procedure: RIGHT/LEFT HEART CATH AND CORONARY ANGIOGRAPHY;  Surgeon: Cherrie Toribio SAUNDERS, MD;  Location: [facility];   Service: Cardiovascular;  Laterality: N/A;   UTERINE FIBROID SURGERY     WISDOM TOOTH EXTRACTION      Current Outpatient Medications  Medication Sig Dispense Refill   aspirin  81 MG EC tablet Take 1 tablet (81 mg total) by mouth daily. 90 tablet 3   atorvastatin  (LIPITOR) 80 MG tablet Take 1 tablet (80 mg total) by mouth daily. 90 tablet 3   azelastine  (ASTELIN ) 0.1 % nasal spray Place 1 spray into both nostrils at bedtime as needed for rhinitis. 90 mL 3   carvedilol  (COREG ) 6.25 MG tablet Take 1 tablet (6.25 mg total) by mouth 2 (two) times daily. 60 tablet 3   ciclopirox  (PENLAC ) 8 % solution Apply topically at bedtime. Apply over nail and surrounding skin. Apply daily over previous coat. After seven (7) days, may remove with alcohol and continue cycle. 6.6 mL 2   diclofenac  (VOLTAREN ) 75 MG EC tablet Take 1 tablet (75 mg total) by mouth 2 (two) times daily. 50 tablet 2   docusate sodium  (COLACE) 100 MG capsule Take 1 capsule (100 mg total) by mouth 2 (two) times daily. 10 capsule 0   Dulaglutide  (TRULICITY ) 3 MG/0.5ML SOAJ Inject 3 mg into the skin once a week. 6 mL 3   Elastic Bandages & Supports (WRIST SPLINT/COCK-UP/LEFT M) MISC Wear nightly 1 each 0   Elastic Bandages & Supports (WRIST SPLINT/COCK-UP/RIGHT M) MISC Wear nightly 1 each 0  empagliflozin  (JARDIANCE ) 10 MG TABS tablet Take 1 tablet (10 mg total) by mouth daily before breakfast. 90 tablet 3   fluticasone (FLONASE) 50 MCG/ACT nasal spray Place 2 sprays into both nostrils daily as needed for allergies or rhinitis. 48 g 3   gabapentin  (NEURONTIN ) 100 MG capsule Take 1 capsule (100 mg total) by mouth 3 (three) times daily. 90 capsule 3   glucose blood (TRUE METRIX BLOOD GLUCOSE TEST) test strip Use as instructed 100 each 12   latanoprost  (XALATAN ) 0.005 % ophthalmic solution Place 1 drop into both eyes at bedtime. 10 mL 3   loratadine (CLARITIN) 10 MG tablet Take 1 tablet (10 mg total) by mouth daily as needed for allergies. 90  tablet 3   Multiple Vitamins-Minerals (ZINC  PO) Take 1 tablet by mouth daily at 6 (six) AM.     omeprazole (PRILOSEC) 20 MG capsule Take 1 capsule (20 mg total) by mouth daily. 30 capsule 3   polyethylene glycol (MIRALAX  / GLYCOLAX ) 17 g packet Take 17 g by mouth daily as needed (constipation). 14 each 0   potassium chloride  SA (KLOR-CON  M) 20 MEQ tablet Take 1 tablet (20 mEq total) by mouth daily as needed. Take 1 tablet (20meq) with torsemide  if needed 30 tablet 3   sacubitril -valsartan  (ENTRESTO ) 49-51 MG Take 1 tablet by mouth 2 (two) times daily. 180 tablet 3   spironolactone  (ALDACTONE ) 25 MG tablet Take 1 tablet (25 mg total) by mouth daily. NEEDS FOLLOW UP FOR ANYMORE REFILLS 30 tablet 5   torsemide  (DEMADEX ) 20 MG tablet Take 1 tablet (20 mg total) by mouth daily as needed. Take 1 tablet (20mg ) daily as needed- please take 1 tablet (20meq) of potassium with this if needed 30 tablet 3   TRUEplus Lancets 28G MISC Use as directed in the morning and at bedtime. 100 each 1   Vitamin D -Vitamin K (K2 PLUS D3 PO) Take 1 tablet by mouth daily.     No current facility-administered medications for this visit.    Allergies as of 06/04/2024 - Review Complete 05/28/2024  Allergen Reaction Noted   Hyzaar [losartan potassium-hctz] Nausea And Vomiting and Cough 03/17/2013    Family History  Problem Relation Age of Onset   Colon polyps Mother 30   Breast cancer Mother 53   Hyperlipidemia Mother    Diabetes Father    Hypertension Father    Hyperlipidemia Sister    Hypertension Sister    Hyperlipidemia Brother    Hypertension Brother    Cancer Maternal Grandmother    Stroke Maternal Grandmother    Esophageal cancer Maternal Grandfather 8   Colon cancer Maternal Grandfather 72   Colon polyps Maternal Grandfather 72   Cancer Maternal Grandfather    Diabetes Maternal Grandfather    Heart disease Maternal Grandfather    Hypertension Maternal Grandfather    Stomach cancer Paternal Grandmother  32   Diabetes Paternal Grandmother    Hypertension Paternal Grandmother    Cancer Paternal Grandfather    Hypertension Paternal Grandfather    Stroke Paternal Grandfather    Rectal cancer Neg Hx     Social History   Tobacco Use   Smoking status: Never   Smokeless tobacco: Never  Vaping Use   Vaping status: Never Used  Substance Use Topics   Alcohol use: No   Drug use: No     Review of Systems:    Constitutional: No weight loss, fever, chills, weakness or fatigue Eyes: No change in vision Ears, Nose, Throat:  No change in hearing or congestion Skin: No rash or itching Cardiovascular: No chest pain, chest pressure or palpitations   Respiratory: No SOB or cough Gastrointestinal: See HPI and otherwise negative Genitourinary: No dysuria or change in urinary frequency Neurological: No headache, dizziness or syncope Musculoskeletal: No new muscle or joint pain Hematologic: No bleeding or bruising    Physical Exam:  Vital signs: There were no vitals taken for this visit.  Constitutional: NAD, Well developed, Well nourished, alert and cooperative Head:  Normocephalic and atraumatic.  Eyes: No scleral icterus. Conjunctiva pink. Mouth: No oral lesions. Respiratory: Respirations even and unlabored. Lungs clear to auscultation bilaterally.  No wheezes, crackles, or rhonchi.  Cardiovascular:  Regular rate and rhythm. No murmurs. No peripheral edema. Gastrointestinal:  Soft, nondistended, nontender. No rebound or guarding. Normal bowel sounds. No appreciable masses or hepatomegaly. Rectal:  Not performed.  Neurologic:  Alert and oriented x4;  grossly normal neurologically.  Skin:   Dry and intact without significant lesions or rashes. Psychiatric: Oriented to person, place and time. Demonstrates good judgement and reason without abnormal affect or behaviors.   RELEVANT LABS AND IMAGING: CBC    Component Value Date/Time   WBC 6.8 05/13/2024 1449   WBC 7.1 12/20/2023 1017    RBC 4.48 05/13/2024 1449   RBC 4.29 12/20/2023 1017   HGB 13.1 05/13/2024 1449   HCT 39.9 05/13/2024 1449   PLT 242 05/13/2024 1449   MCV 89 05/13/2024 1449   MCH 29.2 05/13/2024 1449   MCH 28.0 12/20/2023 1017   MCHC 32.8 05/13/2024 1449   MCHC 32.4 12/20/2023 1017   RDW 12.4 05/13/2024 1449   LYMPHSABS 3.1 03/24/2022 1116   MONOABS 0.6 03/20/2021 0454   EOSABS 0.2 03/24/2022 1116   BASOSABS 0.0 03/24/2022 1116    CMP     Component Value Date/Time   NA 141 05/13/2024 1449   K 5.0 05/13/2024 1449   CL 102 05/13/2024 1449   CO2 27 05/13/2024 1449   GLUCOSE 156 (H) 05/13/2024 1449   GLUCOSE 144 (H) 12/20/2023 1017   BUN 16 05/13/2024 1449   CREATININE 0.95 05/13/2024 1449   CREATININE 0.85 03/02/2014 1603   CALCIUM  9.8 05/13/2024 1449   PROT 6.9 05/13/2024 1449   ALBUMIN 4.2 05/13/2024 1449   AST 17 05/13/2024 1449   ALT 22 05/13/2024 1449   ALKPHOS 138 (H) 05/13/2024 1449   BILITOT 0.5 05/13/2024 1449   GFRNONAA >60 12/20/2023 1017   GFRAA 66 09/17/2020 1031   Echocardiogram 02/02/2024 1. Somewhat unusuual morphology with spade like ventrical in some views and near mid cavitary obstruction from papillary muscles. This combinded with apical hypokinesis and abnormal strain suggest f/ u cardiac MRI to r/ o form of hypertrophic cardiomyopathy . Left ventricular ejection fraction, by estimation, is 50% . The left ventricle has mildly decreased function. The left ventricle has no regional wall motion abnormalities. Left ventricular diastolic parameters are consistent with Grade I diastolic dysfunction ( impaired relaxation) . The average left ventricular global longitudinal strain is - 13. 5 % . The global longitudinal strain is abnormal.  2. Right ventricular systolic function is normal. The right ventricular size is normal.  3. Left atrial size was moderately dilated.  4. The mitral valve is abnormal. Trivial mitral valve regurgitation. No evidence of mitral stenosis.  5. The  aortic valve is tricuspid. There is mild calcification of the aortic valve. There is mild thickening of the aortic valve. Aortic valve regurgitation is not visualized. Aortic valve  sclerosis is present, with no evidence of aortic valve stenosis.  6. The inferior vena cava is normal in size with greater than 50% respiratory variability, suggesting right atrial pressure of 3 mmHg.  Assessment/Plan:    Assessment and Plan Assessment & Plan   Consider hospital procedure with cardiac clearance due to shortness of breath and chest pain with exertion     Camie Furbish, PA-C [facility] 06/04/2024, 7:59 AM  Patient Care Team: Oley Bascom RAMAN, NP as PCP - General (Nurse Practitioner) Nahser, Aleene PARAS, MD (Inactive) as PCP - Cardiology (Cardiology) Shlomo Wilbert SAUNDERS, MD as PCP - Sleep Medicine (Cardiology) Devora Orlean HERO as Social Worker Skeet Juliene SAUNDERS, DO as Consulting Physician (Neurology)

## 2024-06-05 ENCOUNTER — Encounter (HOSPITAL_COMMUNITY): Payer: Self-pay

## 2024-06-06 ENCOUNTER — Ambulatory Visit: Admitting: Physical Therapy

## 2024-06-06 DIAGNOSIS — M79671 Pain in right foot: Secondary | ICD-10-CM | POA: Diagnosis not present

## 2024-06-06 DIAGNOSIS — M722 Plantar fascial fibromatosis: Secondary | ICD-10-CM

## 2024-06-06 NOTE — Therapy (Signed)
 OUTPATIENT PHYSICAL THERAPY LOWER EXTREMITY TREATMENT   Patient Name: Maria Duncan MRN: [id number] DOB:September 05, 1965, 58 y.o., female Today's Date: 06/06/2024  END OF SESSION:  PT End of Session - 06/06/24 0810     Visit Number 12    Number of Visits 27    Date for Recertification  06/18/24    Authorization Type [place] Medicaid    PT Start Time 0810    PT Stop Time 0845    PT Time Calculation (min) 35 min           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) (HCC)    Cataract    not a surgical candidate at this time (02/03/2023)   Chest pain of uncertain etiology, non obstructive CAD, pain due to acute HF 12/12/2019   CHF (congestive heart failure) (HCC)    Diabetes mellitus without complication (HCC)    Diabetic peripheral neuropathy (HCC)    bilateral feet   Heart murmur    childhood   Hyperlipidemia    on meds   Hypertension    on meds   Hypoventilation associated with obesity (HCC) 12/12/2019   Morbid obesity (HCC)    NICM (nonischemic cardiomyopathy) (HCC)    OSA (obstructive sleep apnea) 12/12/2019   uses CPAP   Pulmonary hypertension, unspecified (HCC) 12/12/2019   Past Surgical History:  Procedure Laterality Date   BREAST BIOPSY Left    per pt benign   CESAREAN SECTION  1986   PARTIAL HYSTERECTOMY     RIGHT/LEFT HEART CATH AND CORONARY ANGIOGRAPHY N/A 12/09/2019   Procedure: RIGHT/LEFT HEART CATH AND CORONARY ANGIOGRAPHY;  Surgeon: Cherrie Toribio SAUNDERS, MD;  Location: [facility];  Service: Cardiovascular;  Laterality: N/A;   UTERINE FIBROID SURGERY     WISDOM TOOTH EXTRACTION     Patient Active Problem List   Diagnosis Date Noted   Chest pain 12/14/2023   Colon cancer screening 12/14/2022   Chronic respiratory failure with hypoxia (HCC) 09/21/2022   Type 2 diabetes mellitus with hyperglycemia (HCC) 03/25/2022   Vitamin D  deficiency 08/27/2020   Hyperglycemia 08/07/2020   Pneumonia due to  COVID-19 virus 07/28/2020   Acute hypoxemic respiratory failure due to COVID-19 (HCC) 07/27/2020   Combined congestive systolic and diastolic heart failure (HCC) 07/27/2020   Acute on chronic combined systolic and diastolic CHF (congestive heart failure) (HCC) 01/24/2020   Chest pain of uncertain etiology, non obstructive CAD, pain due to acute HF 12/12/2019   Pulmonary hypertension, unspecified (HCC) 12/12/2019   OSA (obstructive sleep apnea) 12/12/2019   Hypoventilation associated with obesity (HCC) 12/12/2019   CAD in native artery non obstructive on cath 12/09/19 12/12/2019   NICM (nonischemic cardiomyopathy) (HCC)    Acute combined systolic and diastolic HF (heart failure) (HCC)    Morbid obesity (HCC)    CHF exacerbation (HCC) 12/07/2019   Cardiomegaly 12/07/2019   Essential hypertension 12/07/2019   CHF (congestive heart failure) (HCC) 12/07/2019    PCP: Bascom Borer  REFERRING PROVIDER: Donnice Fees  REFERRING DIAG:  [phone] (ICD-10-CM) - Posterior tibial tendon dysfunction (PTTD) of left lower extremity    THERAPY DIAG:  Bilateral foot pain  Plantar fasciitis, bilateral  Rationale for Evaluation and Treatment: Rehabilitation  ONSET DATE: a couple years  SUBJECTIVE:   SUBJECTIVE STATEMENT: 10 min late. Feet ar e a 10/10 esp RT - been doing too much   PERTINENT HISTORY: See above  PAIN:  Are you having pain? Yes:  NPRS scale: 10/10 Pain location: both feet, ankles, back of calf sometimes Pain description: throbbing, needle like pinching in the ankle, constant  Aggravating factors: standing for long periods, walking at the park, stairs, shopping Relieving factors: soaking helps for a couple of hours, massage also helps for about 3 hrs    PRECAUTIONS: None  RED FLAGS: None   WEIGHT BEARING RESTRICTIONS: No  FALLS:  Has patient fallen in last 6 months? 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  DIAGNOSTIC FINDINGS: X-rays were clear   COGNITION: Overall cognitive status: Within functional limits for tasks assessed     SENSATION: WFL   MUSCLE LENGTH: Tightness in HS and calves  POSTURE: rounded shoulders  PALPATION: There is a decreased medial arch upon weightbearing bilaterally. Majority tenderness is localized along the course of the posterior tibial, flexor tendons with the right side worse than left.   Not able to appreciate any area pinpoint tenderness. Ankle, subtalar joint range of motion intact.   LOWER EXTREMITY ROM: All WFL but painful with all motions in ankles    LOWER EXTREMITY MMT: 5/5 BLE   FUNCTIONAL TESTS:  5 times sit to stand: 17.41s Timed up and go (TUG): 13.94s  GAIT: Distance walked: in clinic distances Assistive device utilized: None Level of assistance: Modified independence Comments: antalgic gait, walks with a limp due to pain in feet                                                                                                                                TREATMENT DATE:  06/06/24 Joint mobs toes, mid foot and ankle Passive stretch ankle, foot toes STM to above with deep stripping of plantar fascia that is very rigid and tight Luko tape PF support, post tib and mid foot support   05/28/24 Nustep level 4 x 5 minutes Slant board stretch Seated dyna disc all motions Joint mobs toes, mid foot and ankle Passive stretch ankle, foot toes STM to above Ktape, PF support, post tib and mid foot support  05/23/24 STM BIL PF, Plantar Fascia and Peroneals Calf  Stretching Joint Mobilizations to ankle, mid foot, and phalanges PROM and stretching BIL LE KT tape to BIL PF BIL DF Act 8   05/06/24 [place]affin BIL Feet STM BIL PF, Plantar Fascia and Peroneals Calf Stretching Joint Mobilizations to ankle, mid foot, and phalanges  04/18/24 BIL DF 15 [place]affin BIL feet STM to the PF bilaterally Joint mobs ankle mid foot and phalanges Calf stretching  04/16/24 [place]affin BIL feet STM to the PF bilaterally Joint mobs ankle mid foot and phalanges Calf stretching  04/11/24 L 4 Rocker board  Dyna disc standing WBAT Black bar DF and PF STM to the PF bilaterally Joint mobs mid foot and phalanges Ktape 3 I strips, PF, posterior tib and arch    04/09/24 Nustep level 4 x 6 minutes Slant board calf stretch On airex standing and weight shifts On airex cone toe touches Reviewed HEP STM to the PF bilaterally Joint mobs mid foot and phalanges Ktape 3 I strips, PF, posterior tib and arch   04/04/24 STM ( with and without instrument assist)to the bilateral feet, passive stretch, joint mobs of the mid foot and ankle Ktape one piece to support PF, one piece to support posterior tibialis and then one to support arch  04/02/24 STM to the bilateral feet, passive stretch, joint mobs of the mid foot and ankle Ktape one piece to support PF, one piece to support posterior tibialis and then one to support arch   03/29/24 Nustep level 4 x 4 minutes Slant board stretch Discussion on orthotics and where to go and what to look for Reviewed HEP STM to the bilateral feet, passive stretch, joint mobs of the mid foot Ktape one piece to support PF, one piece to support posterior tibialis and then one to support arch   03/26/24- EVAL, HEP    PATIENT EDUCATION:  Education details: POC, HEP, foot anatomy, pain relief tools such as massage, stretching, etc Person educated: Patient and Spouse Education method: Medical Illustrator Education  comprehension: verbalized understanding, returned demonstration, and needs further education  HOME EXERCISE PROGRAM: Access Code: [facility] URL: [email or web address] Date: 03/26/2024 Prepared by: Almetta Fam  Exercises - Gastroc Stretch on Wall  - 1 x daily - 7 x weekly - 2 reps - 30 hold - Heel Raises with Counter Support  - 1 x daily - 7 x weekly - 2 sets - 10 reps - Long Sitting Calf Stretch with Strap  - 1 x daily - 7 x weekly - 2 reps - 30 hold - Standing Plantar Fascia Mobilization with Small Ball  - 1 x daily - 7 x weekly - Towel Scrunches  - 1 x daily - 7 x weekly - 3 sets - 10 reps - Seated Plantar Fascia Stretch  - 1 x daily - 7 x weekly - 2 reps - 30 hold  ASSESSMENT:  CLINICAL IMPRESSION: pnt missed appt earlier in week as she ate something that made her sick. Arrives today 10 min late with pain 10/10 and very antalgic gait RT > left. Changed and tried rigid taping to try more support as she has a very long work weekend. PF very tight and rigid- responded well to tx. Reminded pt again about trying fleet feet for better support as sh eid very flat footed and over pronated. Also rec f/u with foot doctor and mention possibility of shock wave therapy. OBJECTIVE IMPAIRMENTS: Abnormal gait, decreased activity tolerance, decreased balance, difficulty walking, increased fascial restrictions, impaired flexibility, and pain.   ACTIVITY LIMITATIONS: bending, squatting, stairs, and locomotion level  PARTICIPATION LIMITATIONS: cleaning, laundry, driving, shopping, community activity, occupation, and yard work  PERSONAL FACTORS: Age, Past/current experiences, and Time since onset of injury/illness/exacerbation are also affecting patient's functional outcome.   REHAB POTENTIAL: Good  CLINICAL DECISION MAKING: Stable/uncomplicated  EVALUATION COMPLEXITY: Low   GOALS: Goals reviewed with patient? Yes  SHORT TERM GOALS: Target date: 05/07/24  Patient will be  independent  with initial HEP. Baseline:  Goal status: 04/02/24 MET  2.  Patient will report at least 50% improvement in bilateral ankle/foot pain to improve QOL. (5/10) Baseline: 10/10  Goal status: ongoing 04/09/24  and 04/18/24  and 06/06/24    LONG TERM GOALS: Target date: 06/18/24  Patient will be independent with advanced/ongoing HEP to improve outcomes and carryover.  Baseline:  Goal status: evolving 05/23/24  and 06/06/24  2.  Patient will report at least 75% improvement in bilateral ankle/foot pain to improve QOL. (2/10) Baseline: 10/10 at eval; 6/10 end of session 10/13 Goal status: progressing 05/23/24  3.  Patient will be able to ambulate 600' with normal gait pattern without increased foot/ankle pain to access community.  Baseline: antalgic gait; progressing 10/13 (still antalgic) Goal status: 05/23/24 antalgic  and 06/06/24  4. Patient will be able to ascend/descend stairs with 1 HR and reciprocal step pattern safely to access home and community.  Baseline: step to with rails Goal status: INITIAL  5.  Patient will be able to walk, dance, and do a light jog without pain. Baseline: stopped doing all of these d/t pain Goal status: on going 04/18/24 tried to dance but added to set back this week   PLAN:  PT FREQUENCY: 2x/week  PT DURATION: 12 weeks  PLANNED INTERVENTIONS: 97110-Therapeutic exercises, 97530- Therapeutic activity, 97112- Neuromuscular re-education, 97535- Self Care, [id number]- Manual therapy, [phone]- Gait training, [phone]- Electrical stimulation (unattended), 97035- Ultrasound, [id number]- Ionotophoresis 4mg /ml Dexamethasone , [id number] (1-2 muscles), [id number] (3+ muscles)- Dry Needling, Patient/Family education, Balance training, Stair training, Taping, Joint mobilization, Cryotherapy, and Moist heat  PLAN FOR NEXT SESSION: assess luko tape and if she got f/u with MD   Dayanis Bergquist,ANGIE, PTA, 06/06/2024, 8:10 AM

## 2024-06-07 ENCOUNTER — Ambulatory Visit (HOSPITAL_COMMUNITY)
Admission: RE | Admit: 2024-06-07 | Discharge: 2024-06-07 | Disposition: A | Discharge status: Home / Self Care | Source: Ambulatory Visit | Attending: Internal Medicine | Admitting: Internal Medicine

## 2024-06-07 ENCOUNTER — Other Ambulatory Visit (HOSPITAL_COMMUNITY): Payer: Self-pay | Admitting: Internal Medicine

## 2024-06-07 DIAGNOSIS — I5042 Chronic combined systolic (congestive) and diastolic (congestive) heart failure: Secondary | ICD-10-CM

## 2024-06-07 MED ORDER — GADOBUTROL 1 MMOL/ML IV SOLN
13.0000 mL | Freq: Once | INTRAVENOUS | Status: AC | PRN
  Administered 2024-06-07: 13 mL via INTRAVENOUS

## 2024-06-11 ENCOUNTER — Ambulatory Visit: Admitting: Physical Therapy

## 2024-06-13 ENCOUNTER — Ambulatory Visit: Admitting: Physical Therapy

## 2024-06-13 DIAGNOSIS — M79671 Pain in right foot: Secondary | ICD-10-CM | POA: Diagnosis not present

## 2024-06-13 DIAGNOSIS — M722 Plantar fascial fibromatosis: Secondary | ICD-10-CM

## 2024-06-13 NOTE — Therapy (Signed)
 OUTPATIENT PHYSICAL THERAPY LOWER EXTREMITY TREATMENT   Patient Name: Maria Duncan MRN: [id number] DOB:1966/07/13, 58 y.o., female Today's Date: 06/13/2024  END OF SESSION:  PT End of Session - 06/13/24 0805     Visit Number 13    Number of Visits 27    Date for Recertification  06/18/24    Authorization Type [place] Medicaid    PT Start Time 0807    PT Stop Time 0845    PT Time Calculation (min) 38 min           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) (HCC)    Cataract    not a surgical candidate at this time (02/03/2023)   Chest pain of uncertain etiology, non obstructive CAD, pain due to acute HF 12/12/2019   CHF (congestive heart failure) (HCC)    Diabetes mellitus without complication (HCC)    Diabetic peripheral neuropathy (HCC)    bilateral feet   Heart murmur    childhood   Hyperlipidemia    on meds   Hypertension    on meds   Hypoventilation associated with obesity (HCC) 12/12/2019   Morbid obesity (HCC)    NICM (nonischemic cardiomyopathy) (HCC)    OSA (obstructive sleep apnea) 12/12/2019   uses CPAP   Pulmonary hypertension, unspecified (HCC) 12/12/2019   Past Surgical History:  Procedure Laterality Date   BREAST BIOPSY Left    per pt benign   CESAREAN SECTION  1986   PARTIAL HYSTERECTOMY     RIGHT/LEFT HEART CATH AND CORONARY ANGIOGRAPHY N/A 12/09/2019   Procedure: RIGHT/LEFT HEART CATH AND CORONARY ANGIOGRAPHY;  Surgeon: Cherrie Toribio SAUNDERS, MD;  Location: [facility];  Service: Cardiovascular;  Laterality: N/A;   UTERINE FIBROID SURGERY     WISDOM TOOTH EXTRACTION     Patient Active Problem List   Diagnosis Date Noted   Chest pain 12/14/2023   Colon cancer screening 12/14/2022   Chronic respiratory failure with hypoxia (HCC) 09/21/2022   Type 2 diabetes mellitus with hyperglycemia (HCC) 03/25/2022   Vitamin D  deficiency 08/27/2020   Hyperglycemia 08/07/2020   Pneumonia due to  COVID-19 virus 07/28/2020   Acute hypoxemic respiratory failure due to COVID-19 (HCC) 07/27/2020   Combined congestive systolic and diastolic heart failure (HCC) 07/27/2020   Acute on chronic combined systolic and diastolic CHF (congestive heart failure) (HCC) 01/24/2020   Chest pain of uncertain etiology, non obstructive CAD, pain due to acute HF 12/12/2019   Pulmonary hypertension, unspecified (HCC) 12/12/2019   OSA (obstructive sleep apnea) 12/12/2019   Hypoventilation associated with obesity (HCC) 12/12/2019   CAD in native artery non obstructive on cath 12/09/19 12/12/2019   NICM (nonischemic cardiomyopathy) (HCC)    Acute combined systolic and diastolic HF (heart failure) (HCC)    Morbid obesity (HCC)    CHF exacerbation (HCC) 12/07/2019   Cardiomegaly 12/07/2019   Essential hypertension 12/07/2019   CHF (congestive heart failure) (HCC) 12/07/2019    PCP: Bascom Borer  REFERRING PROVIDER: Donnice Fees  REFERRING DIAG:  [phone] (ICD-10-CM) - Posterior tibial tendon dysfunction (PTTD) of left lower extremity    THERAPY DIAG:  Bilateral foot pain  Plantar fasciitis, bilateral  Rationale for Evaluation and Treatment: Rehabilitation  ONSET DATE: a couple years  SUBJECTIVE:   SUBJECTIVE STATEMENT: seeing MD in 2 weeks. Could not get out of bed Tuesday so had to cancel. STW and taping helped about 3 days. PERTINENT HISTORY: See above  PAIN:  Are you having pain? Yes: NPRS scale: 8/10 Pain location: both feet, ankles, back of calf sometimes Pain description: throbbing, needle like pinching in the ankle, constant  Aggravating factors: standing for long periods, walking at the park, stairs, shopping Relieving factors: soaking helps for a couple of hours, massage also helps for about 3 hrs    PRECAUTIONS: None  RED FLAGS: None   WEIGHT BEARING RESTRICTIONS: No  FALLS:  Has patient fallen in last 6 months? 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  DIAGNOSTIC FINDINGS: X-rays were clear   COGNITION: Overall cognitive status: Within functional limits for tasks assessed     SENSATION: WFL   MUSCLE LENGTH: Tightness in HS and calves  POSTURE: rounded shoulders  PALPATION: There is a decreased medial arch upon weightbearing bilaterally. Majority tenderness is localized along the course of the posterior tibial, flexor tendons with the right side worse than left.   Not able to appreciate any area pinpoint tenderness. Ankle, subtalar joint range of motion intact.   LOWER EXTREMITY ROM: All WFL but painful with all motions in ankles    LOWER EXTREMITY MMT: 5/5 BLE   FUNCTIONAL TESTS:  5 times sit to stand: 17.41s Timed up and go (TUG): 13.94s  GAIT: Distance walked: in clinic distances Assistive device utilized: None Level of assistance: Modified independence Comments: antalgic gait, walks with a limp due to pain in feet                                                                                                                                TREATMENT DATE:  06/13/24 Joint mobs toes, mid foot and ankle Passive stretch ankle, foot toes STM to above with deep stripping of plantar fascia that is very rigid and tight Luko tape PF support, post tib and mid foot support    06/06/24 Joint mobs toes, mid foot and ankle Passive stretch ankle, foot toes STM to above with deep stripping of plantar fascia that is very rigid and tight Luko tape PF support, post tib and mid foot support   05/28/24 Nustep level  4 x 5 minutes Slant board stretch Seated dyna disc all motions Joint mobs toes, mid foot and ankle Passive stretch ankle, foot toes STM to above Ktape, PF support, post tib and mid foot support  05/23/24 STM BIL PF, Plantar Fascia and Peroneals Calf Stretching Joint Mobilizations to ankle, mid foot, and phalanges PROM and stretching BIL LE KT tape to BIL PF BIL DF Act 8  05/06/24 Paraffin BIL Feet STM BIL PF, Plantar Fascia and Peroneals Calf Stretching Joint Mobilizations to ankle, mid foot, and phalanges  04/18/24 BIL DF 15 Paraffin BIL feet STM to the PF bilaterally Joint mobs ankle mid foot and phalanges Calf stretching   04/16/24 [place]affin BIL feet STM to the PF bilaterally Joint mobs ankle mid foot and phalanges Calf stretching  04/11/24 L 4 Rocker board  Dyna disc standing WBAT Black bar DF and PF STM to the PF bilaterally Joint mobs mid foot and phalanges Ktape 3 I strips, PF, posterior tib and arch    04/09/24 Nustep level 4 x 6 minutes Slant board calf stretch On airex standing and weight shifts On airex cone toe touches Reviewed HEP STM to the PF bilaterally Joint mobs mid foot and phalanges Ktape 3 I strips, PF, posterior tib and arch   04/04/24 STM ( with and without instrument assist)to the bilateral feet, passive stretch, joint mobs of the mid foot and ankle Ktape one piece to support PF, one piece to support posterior tibialis and then one to support arch  04/02/24 STM to the bilateral feet, passive stretch, joint mobs of the mid foot and ankle Ktape one piece to support PF, one piece to support posterior tibialis and then one to support arch   03/29/24 Nustep level 4 x 4 minutes Slant board stretch Discussion on orthotics and where to go and what to look for Reviewed HEP STM to the bilateral feet, passive stretch, joint mobs of the mid foot Ktape one piece to support PF, one piece to support posterior tibialis and then one to support  arch   03/26/24- EVAL, HEP    PATIENT EDUCATION:  Education details: POC, HEP, foot anatomy, pain relief tools such as massage, stretching, etc Person educated: Patient and Spouse Education method: Medical Illustrator Education comprehension: verbalized understanding, returned demonstration, and needs further education  HOME EXERCISE PROGRAM: Access Code: [facility] URL: [email or web address] Date: 03/26/2024 Prepared by: Almetta Fam  Exercises - Gastroc Stretch on Wall  - 1 x daily - 7 x weekly - 2 reps - 30 hold - Heel Raises with Counter Support  - 1 x daily - 7 x weekly - 2 sets - 10 reps - Long Sitting Calf Stretch with Strap  - 1 x daily - 7 x weekly - 2 reps - 30 hold - Standing Plantar Fascia Mobilization with Small Ball  - 1 x daily - 7 x weekly - Towel Scrunches  - 1 x daily - 7 x weekly - 3 sets - 10 reps - Seated Plantar Fascia Stretch  - 1 x daily - 7 x weekly - 2 reps - 30 hold  ASSESSMENT:  CLINICAL IMPRESSION: pnt missed appt earlier in week . Amb in antalgic stating PT STW, stretching and tape last about 3 days. 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 Pnt did buy a rolling stool to help unload feet.   OBJECTIVE IMPAIRMENTS: Abnormal gait, decreased activity tolerance, decreased balance, difficulty walking, increased fascial restrictions, impaired flexibility, and pain.   ACTIVITY LIMITATIONS: bending, squatting, stairs, and locomotion level  PARTICIPATION LIMITATIONS: cleaning, laundry, driving, shopping, community activity, occupation, and yard work  PERSONAL FACTORS: Age, Past/current experiences, and Time since onset of injury/illness/exacerbation are also affecting patient's functional outcome.   REHAB POTENTIAL: Good  CLINICAL DECISION MAKING: Stable/uncomplicated  EVALUATION COMPLEXITY: Low   GOALS: Goals reviewed with  patient? Yes  SHORT TERM GOALS: Target date:  05/07/24  Patient will be independent with initial HEP. Baseline:  Goal status: 04/02/24 MET  2.  Patient will report at least 50% improvement in bilateral ankle/foot pain to improve QOL. (5/10) Baseline: 10/10  Goal status: ongoing 04/09/24  and 04/18/24  and 06/06/24  and 06/13/24    LONG TERM GOALS: Target date: 06/18/24  Patient will be independent with advanced/ongoing HEP to improve outcomes and carryover.  Baseline:  Goal status: evolving 05/23/24  and 06/06/24  2.  Patient will report at least 75% improvement in bilateral ankle/foot pain to improve QOL. (2/10) Baseline: 10/10 at eval; 6/10 end of session 10/13 Goal status: progressing 05/23/24  3.  Patient will be able to ambulate 600' with normal gait pattern without increased foot/ankle pain to access community.  Baseline: antalgic gait; progressing 10/13 (still antalgic) Goal status: 05/23/24 antalgic  and 06/06/24  4. Patient will be able to ascend/descend stairs with 1 HR and reciprocal step pattern safely to access home and community.  Baseline: step to with rails Goal status: INITIAL  5.  Patient will be able to walk, dance, and do a light jog without pain. Baseline: stopped doing all of these d/t pain Goal status: on going 04/18/24 tried to dance but added to set back this week   PLAN:  PT FREQUENCY: 2x/week  PT DURATION: 12 weeks  PLANNED INTERVENTIONS: 97110-Therapeutic exercises, 97530- Therapeutic activity, 97112- Neuromuscular re-education, 97535- Self Care, 02859- Manual therapy, [phone]- Gait training, [phone]- Electrical stimulation (unattended), [id number]- Ultrasound, [id number]- Ionotophoresis 4mg /ml Dexamethasone , 79439 (1-2 muscles), 20561 (3+ muscles)- Dry Needling, Patient/Family education, Balance training, Stair training, Taping, Joint mobilization, Cryotherapy, and Moist heat  PLAN FOR NEXT SESSION: assess MD f/u   Marrissa Dai,ANGIE, PTA, 06/13/2024, 8:06 AM

## 2024-06-14 ENCOUNTER — Other Ambulatory Visit: Payer: Self-pay

## 2024-06-15 ENCOUNTER — Ambulatory Visit (HOSPITAL_COMMUNITY): Payer: Self-pay | Admitting: Internal Medicine

## 2024-06-25 ENCOUNTER — Other Ambulatory Visit: Payer: Self-pay

## 2024-06-25 NOTE — Progress Notes (Deleted)
 06/25/2024 Name: Maria Duncan MRN: [id number] DOB: 1966-06-06  No chief complaint on file.   Maria Duncan is a 58 y.o. year old female who presented for a telephone visit.   They were referred to the pharmacist by their PCP for assistance in managing diabetes. PMH includes CHF(last EF 50% in July 2025, recovered from 35-40% in 2021), HTN, nonobstructive CAD, OSA, T2DM, obesity    Subjective: Patient was last seen by PCP, Bascom Maria, on 02/26/24. BP was 101/70 mmHg, HR 85. A1C improved from 8.0% to 7.8%. She was continued on Trulicity  3 mg weekly. Previously, she was seen by Dr. Cherrie on 01/19/24 and instructed to start Jardiance  10 mg daily. She had a repeat cardiac CT and echo. CT showed minimal nonobstructive CAD and Echo was stable with EF 50% and G1DD.  Today, patient reports doing ok. She has Medicaid again and reports copays can add up for her. She has been tolerating Trulicity  3 mg weekly well and requests 3 mo supply of this at next fill. Also would like 3 mo supply of Jardiance . She continues to report stabbing chest pain that lasts for 15-20 minutes. She has had cardiac workup without new abnormalities. She does not think it is indigestion and is not willing to trial PPI. She is not needing torsemide .   Adherence  Care Team: Primary Care Provider: Borer Bascom RAMAN, NP ; Next Scheduled Visit: 05/13/24 Cardiologist: Dr. Bensimhon; Next Scheduled Visit: Scheduled 05/20/24  Medication Access/Adherence  Current Pharmacy:  [facility] [address] [place] [address] Phone: [phone] Fax: [phone]  [place] - [facility] [address] [place] [address] Phone: [phone] Fax: [phone]  [facility] [place], [place] - [address] [place] [address] Phone: [phone] Fax: [phone]  [id number] [place], [place] - [address] AT [facility] [address] [place] [address] Phone: [phone] Fax: [phone]  [id number] - [place], [place] - [address] AT [facility] OF [address] [facility] [address] [facility] [address] [place] [address] Phone: [phone] Fax: [phone]   Patient reports affordability concerns with their medications: No  Patient reports access/transportation concerns to their pharmacy: No  Patient reports adherence concerns with their medications:  No     Diabetes:  Current medications: Trulicity  3 mg weekly (looks like 1.5 mg dose may have been filled last month) Jardiance  10 mg daily  Tolerating Trulicity  well. Unclear whether chest pain that comes and goes is heartburn pain. She does not think it is.   Has glucometer, not currently checking.  Patient denies hypoglycemic s/sx including dizziness, shakiness, sweating. Patient denies hyperglycemic symptoms  Current meal patterns: 3 meals/day (documented at previous visit) - Breakfast: oatmeal, waffles, eggs, turkey bacon or turkey sausage, vegan meats - Lunch: turkey and cheese sandwich, fruit bowl, acai bowl, egg, granola, tuna and crackers, salads - Supper: baked turkey, chicken, fish, green beans, collards, okra - Snacks: apple sauce, yogurt, carrots, cucumber with light ranch, grapes, berries - Drinks: water only  Current physical activity (documented at previous visit): used to go for walks, but has had difficulty recently due to ankle pain, trying chair exercises  Current medication access support: Medicaid   Hyperlipidemia/ASCVD Risk Reduction  Current lipid lowering medications: atorvastatin  80 mg daily  Antiplatelet regimen: aspirin  81 mg daily  ASCVD  History: nonobstructive CAD on cath Risk Factors: T2DM, HLD, HTN   Heart Failure (EF 55-60%):  Current medications:  ACEi/ARB/ARNI: Entresto  49-51 mg BID SGLT2i: Jardiance   10 mg daily Beta blocker: carvedilol  6.25 mg BID Mineralocorticoid Receptor Antagonist: spironolactone  25 mg daily Diuretic regimen: torsemide  20 mg daily PRN with Kcl 20 mEq PRN - not needed recently  Current home blood pressure readings: does not check Current home weights: did not evaluate today  Objective:  BP Readings from Last 3 Encounters:  05/20/24 124/80  05/13/24 120/65  05/03/24 124/84    Lab Results  Component Value Date   HGBA1C 7.1 (A) 05/13/2024   HGBA1C 7.8 (A) 02/26/2024   HGBA1C 8.0 (A) 11/24/2023    Lab Results  Component Value Date   CREATININE 0.95 05/13/2024   BUN 16 05/13/2024   NA 141 05/13/2024   K 5.0 05/13/2024   CL 102 05/13/2024   CO2 27 05/13/2024    Lab Results  Component Value Date   CHOL 152 05/13/2024   HDL 63 05/13/2024   LDLCALC 71 05/13/2024   TRIG 100 05/13/2024   CHOLHDL 2.4 05/13/2024    Medications Reviewed Today   Medications were not reviewed in this encounter      Assessment/Plan:   Diabetes: - Currently uncontrolled based on last A1c 7.8% above goal < 7%, but much improved from 12.9% last year. She does not want to change or escalate any medications. Will focus on adherence and lifestyle per patient preference. Will facilitate getting her refills for 90ds to help with costs. Could consider increasing Jardiance  to 25 mg daily, if patient continues to tolerate well without GU AE (as she previously had issues with yeast infections, likely worsened by poor glycemic control). If next A1C > 8%, can consider enrollment in LIBERATE study. - Reviewed long term cardiovascular and renal outcomes of uncontrolled blood sugar - Reviewed goal A1c, goal fasting, and goal 2 hour post prandial glucose - Reviewed dietary modifications including incorporating protein with each meal and limiting portion size of carbohydrates - Recommend to continue Trulicity  to 3 mg once weekly, Jardiance  10 mg daily - Recommend to check glucose daily -  A1c due 05/28/24    Hyperlipidemia/ASCVD Risk Reduction: - Currently uncontrolled with last LDL 80 mg/dl on 7/85/75. LDL-C goal < 55 mg/dL given premature ASCVD (non-obstructive CAD on cath)  - Reviewed long term complications of uncontrolled cholesterol - Recommend to continue atorvastatin  80 mg daily     Heart Failure: - Currently appropriately managed - patient following with Adv HF clinic - Reviewed appropriate blood pressure monitoring technique and reviewed goal blood pressure - Reviewed to weigh daily and when to contact cardiology with weight gain - Recommend to continue current regimen Current medications:  ACEi/ARB/ARNI: Entresto  49-51 mg BID SGLT2i: Jardiance  10 mg daily Beta blocker: carvedilol  6.25 mg BID Mineralocorticoid Receptor Antagonist: spironolactone  25 mg daily Diuretic regimen: torsemide  20 mg daily PRN with Kcl 20 mEq PRN     Follow Up Plan: PCP 05/13/24, Cardiology 05/20/24, Pharmacist telephone 06/25/24   Lorain Baseman, PharmD [facility] [phone]

## 2024-06-26 ENCOUNTER — Ambulatory Visit

## 2024-06-27 ENCOUNTER — Ambulatory Visit

## 2024-07-01 ENCOUNTER — Other Ambulatory Visit: Payer: Self-pay

## 2024-07-01 DIAGNOSIS — E1165 Type 2 diabetes mellitus with hyperglycemia: Secondary | ICD-10-CM

## 2024-07-01 MED ORDER — TRULICITY 1.5 MG/0.5ML ~~LOC~~ SOAJ
1.5000 mg | SUBCUTANEOUS | 3 refills | Status: DC
  Filled 2024-07-01: qty 2, 28d supply, fill #0

## 2024-07-01 NOTE — Progress Notes (Signed)
 07/01/2024 Name: Maria Duncan MRN: [id number] DOB: [birth year]  Chief Complaint  Patient presents with   Diabetes   Congestive Heart Failure    Maria Duncan is a 58 y.o. year old female who presented for a telephone visit.   They were referred to the pharmacist by their PCP for assistance in managing diabetes. PMH includes CHF(last EF 50% in July 2025, recovered from 35-40% in 2021), HTN, nonobstructive CAD, OSA, T2DM, obesity    Subjective: Patient was seen by PCP, Bascom Borer, on 02/26/24. BP was 101/70 mmHg, HR 85. A1C improved from 8.0% to 7.8%. She was continued on Trulicity  3 mg weekly. Previously, she was seen by Dr. Bensimhon on 01/19/24 and instructed to start Jardiance  10 mg daily. She had a repeat cardiac CT and echo. CT showed minimal nonobstructive CAD and Echo was stable with EF 50% and G1DD. At pharmacy call on 04/30/24, patient reported some difficulty affording medications. Set her up with 3 mo supplies to reduce Medicaid total copays. She had been reporting stabbing/burning chest pain. Was not willing to trial PPI for possible GERD from GLP-1. She had cardiac workup, normal cMRI. At PCP appt on 05/13/24, A1C had improved to 7.1%. Amylase and lipase were WNL.  Today, patient reports doing ok. She is relieved there was nothing concerning found on cardiac workup, but confused why she is still having chest pain. Does not think it is related to the food she is eating, as she states it only happens after she is standing or physically active for a couple times in a row. When she is experiencing pain she does have ShOB and wheezing. States it feels like a muscle spasm. Denies needing inhalers in the past. She is not willing to add any medication to manage this pain, prefers natural remedies. States she did not have this issue on lower dose of Trulicity  and is interested in going back to 1.5 mg dose.   Adherence  Care Team: Primary Care Provider: Borer Bascom RAMAN, NP ; Next Scheduled  Visit: 08/13/23 Cardiologist: Dr. Bensimhon; Next Scheduled Visit: n/a Gastro scheduled 07/29/24  Medication Access/Adherence  Current Pharmacy:  [facility] [address], [address] [place] [address] Phone: [phone] Fax: [phone]  [place] [facility] [address] [place] [address] Phone: [phone] Fax: [phone]  [facility] [place], [place] - [address] [place] [address] Phone: [phone] Fax: [phone]  [id number] [place], [place] - [address] AT [facility] [address] [place] [address] Phone: [phone] Fax: [phone]  [id number] - [place], [place] - [address] AT [facility] [address] [facility] [address] [place] [address] Phone: [phone] Fax: [phone]   Patient reports affordability concerns with their medications: No  - uses [facility] pharmacy Patient reports access/transportation concerns to their pharmacy: No  Patient reports adherence concerns with their medications:  No     Diabetes:  Current medications: Trulicity  3 mg weekly (filled 3 mo supply in November, Jardiance  10 mg daily  Tolerating Trulicity  3 mg ok - unclear whether chest pain that comes and goes is heartburn pain. She does not think it is. Reports she has been eating a bit more unhealthy foods recently.  Has glucometer, not currently checking.  Patient denies hypoglycemic s/sx including dizziness, shakiness, sweating. Patient denies  hyperglycemic symptoms. States that she is having some blurriness in R eye - plans to schedule appt with eye doctor.   Current meal patterns: 3 meals/day (documented at previous visit) - Breakfast: oatmeal, waffles, eggs, turkey bacon or turkey sausage, vegan meats - Lunch: turkey and cheese sandwich, fruit bowl,  acai bowl, egg, granola, tuna and crackers, salads - Supper: baked turkey, chicken, fish, green beans, collards, okra - Snacks: apple sauce, yogurt, carrots, cucumber with light ranch, grapes, berries - Drinks: water only  Current physical activity (documented at previous visit): used to go for walks, but has had difficulty recently due to ankle pain, trying chair exercises  Current medication access support: Medicaid   Hyperlipidemia/ASCVD Risk Reduction  Current lipid lowering medications: atorvastatin  80 mg daily  Antiplatelet regimen: aspirin  81 mg daily  ASCVD History: nonobstructive CAD on cath Risk Factors: T2DM, HLD, HTN   Heart Failure (EF 55-60%):  Current medications:  ACEi/ARB/ARNI: Entresto  49-51 mg BID SGLT2i: Jardiance  10 mg daily Beta blocker: carvedilol  6.25 mg BID Mineralocorticoid Receptor Antagonist: spironolactone  25 mg daily Diuretic regimen: torsemide  20 mg daily PRN with Kcl 20 mEq PRN - not needed recently  Current home blood pressure readings: does not check Current home weights: did not evaluate today  Objective:  BP Readings from Last 3 Encounters:  05/20/24 124/80  05/13/24 120/65  05/03/24 124/84    Lab Results  Component Value Date   HGBA1C 7.1 (A) 05/13/2024   HGBA1C 7.8 (A) 02/26/2024   HGBA1C 8.0 (A) 11/24/2023    Lab Results  Component Value Date   CREATININE 0.95 05/13/2024   BUN 16 05/13/2024   NA 141 05/13/2024   K 5.0 05/13/2024   CL 102 05/13/2024   CO2 27 05/13/2024    Lab Results  Component Value Date   CHOL 152 05/13/2024   HDL 63 05/13/2024   LDLCALC 71 05/13/2024   TRIG 100 05/13/2024   CHOLHDL 2.4 05/13/2024    Medications Reviewed Today     Reviewed by Brinda Lorain SQUIBB, RPH-CPP (Pharmacist) on 07/01/24 at 1331  Med List Status: <None>   Medication Order Taking? Sig Documenting Provider Last Dose Status Informant  aspirin  81 MG EC tablet [id number] Yes Take 1 tablet (81 mg total) by mouth daily. Fernande Elspeth BROCKS, MD  Active Self  atorvastatin  (LIPITOR) 80 MG tablet [id number] Yes Take 1 tablet (80 mg total) by mouth daily. Aspers, [place], FNP  Active   azelastine  (ASTELIN ) 0.1 % nasal spray [id number]  Place 1 spray into both nostrils at bedtime as needed for rhinitis. Parrett, Madelin RAMAN, NP  Active   carvedilol  (COREG ) 6.25 MG tablet [id number] Yes Take 1 tablet (6.25 mg total) by mouth 2 (two) times daily. Ma[place], [place], [place]  Active   ciclopirox  (PENLAC ) 8 % solution [id number]  Apply topically at bedtime. Apply over nail and surrounding skin. Apply daily over previous coat. After seven (7) days, may remove with alcohol and continue cycle. Gershon Donnice SAUNDERS, DPM  Active   diclofenac  (VOLTAREN ) 75 MG EC tablet [id number]  Take 1 tablet (75 mg total) by mouth 2 (two) times daily. [place]if[place], Marsa FALCON, DPM  Active   docusate sodium  (COLACE) 100 MG capsule [id number]  Take 1 capsule (100 mg total) by mouth 2 (two) times daily. Marylu Leita SAUNDERS, NP  Active Self           Med Note ANNELL JEANNINE CHRISTELLA Pablo May 20, 2024  8:47 AM) As needed  Dulaglutide  (  TRULICITY ) 3 MG/0.5ML SOAJ [id number] Yes Inject 3 mg into the skin once a week. Oley Bascom RAMAN, NP  Active            Med Note ANNELL JEANNINE CHRISTELLA Pablo May 20, 2024  8:48 AM) Patient takes on Tuesdays.  Elastic Bandages & Supports (WRIST SPLINT/COCK-UP/LEFT M) MISC [id number]  Wear nightly [place] Juliene SAUNDERS, DO  Active   Elastic Bandages & Supports (WRIST SPLINT/COCK-UP/RIGHT M) MISC [id number]  Wear nightly Skeet Juliene SAUNDERS, DO  Active   empagliflozin  (JARDIANCE ) 10 MG TABS tablet [id number] Yes Take 1 tablet (10 mg total) by mouth daily before breakfast. Oley Bascom RAMAN, NP  Active   fluticasone  (FLONASE ) 50 MCG/ACT nasal spray [id number]  Place 2 sprays into both nostrils daily as needed for allergies or rhinitis. Parrett, Madelin RAMAN, NP  Active   gabapentin  (NEURONTIN ) 100 MG capsule [id number] Yes Take 1 capsule (100 mg total) by mouth 3 (three) times daily.  Oley Bascom RAMAN, NP  Active   glucose blood (TRUE METRIX BLOOD GLUCOSE TEST) test strip [id number]  Use as instructed Myrna Camelia CHRISTELLA, NP  Active   latanoprost  (XALATAN ) 0.005 % ophthalmic solution [id number]  Place 1 drop into both eyes at bedtime.   Active   loratadine  (CLARITIN ) 10 MG tablet [id number]  Take 1 tablet (10 mg total) by mouth daily as needed for allergies. Parrett, Madelin RAMAN, NP  Active   Multiple Vitamins-Minerals (ZINC  PO) [id number]  Take 1 tablet by mouth daily at 6 (six) AM. [provider]  Active   omeprazole  (PRILOSEC) 20 MG capsule [id number]  Take 1 capsule (20 mg total) by mouth daily.  Patient not taking: Reported on 07/01/2024   Oley Bascom RAMAN, NP  Active   polyethylene glycol (MIRALAX  / GLYCOLAX ) 17 g packet [id number]  Take 17 g by mouth daily as needed (constipation). Marylu Leita SAUNDERS, NP  Active            Med Note SAMULE IHA   Wed Sep 07, 2022  8:33 AM) Prn   potassium chloride  SA (KLOR-CON  M) 20 MEQ tablet [id number]  Take 1 tablet (20 mEq total) by mouth daily as needed. Take 1 tablet ( ) with torsemide  if needed Holly, Harlene CHRISTELLA, FNP  Active   sacubitril -valsartan  (ENTRESTO ) 49-51 MG [id number] Yes Take 1 tablet by mouth 2 (two) times daily. [place], Harlene CHRISTELLA, FNP  Active   spironolactone  (ALDACTONE ) 25 MG tablet [id number] Yes Take 1 tablet (25 mg total) by mouth daily. NEEDS FOLLOW UP FOR ANYMORE REFILLS [place], Harlene CHRISTELLA, FNP  Active   torsemide  (DEMADEX ) 20 MG tablet [id number]  Take 1 tablet (20 mg total) by mouth daily as needed. Take 1 tablet (20mg ) daily as needed- please take 1 tablet (20meq) of potassium with this if needed [place], Harlene CHRISTELLA, FNP  Active   TRUEplus Lancets 28G MISC [id number]  Use as directed in the morning and at bedtime. Myrna Camelia CHRISTELLA, NP  Active   Vitamin D -Vitamin K (K2 PLUS D3 PO) [id number]  Take 1 tablet by mouth daily. [provider]  Active              Assessment/Plan:   Diabetes: -  Currently uncontrolled based on last A1c 7.1% above goal < 7%, but much improved. She does not want to change or escalate any medications. Would like to fill lower dose of Trulicity  when next refill is due (in February 2026). Will focus on adherence and lifestyle per patient preference. Will  facilitate getting her refills for 90ds to help with costs. In the future, could consider increasing Jardiance  to 25 mg daily, if patient continues to tolerate well without GU AE (as she previously had issues with yeast infections, likely worsened by poor glycemic control) however, she has been adamantly against escalating medications. - Reviewed long term cardiovascular and renal outcomes of uncontrolled blood sugar - Reviewed goal A1c, goal fasting, and goal 2 hour post prandial glucose - Reviewed dietary modifications including incorporating protein with each meal and limiting portion size of carbohydrates - Recommend to continue Trulicity  to 3 mg once weekly, Jardiance  10 mg daily - Recommend to decrease Trulicity  to 1.5 mg weekly when she is due for her next fill - Recommend to check glucose FBG and 2-hr PPG a couple times per week - A1c due 08/14/23    Hyperlipidemia/ASCVD Risk Reduction: - Currently uncontrolled with last LDL 71 mg/dl above goal < 55 mg/dL given premature ASCVD (non-obstructive CAD on cath), however it has improved with improved adherence. Patient not willing to add additional medication. - Reviewed long term complications of uncontrolled cholesterol - Recommend to continue atorvastatin  80 mg daily, aspirin  81 mg daily.    Heart Failure: - Currently appropriately managed - patient following with Adv HF clinic - Reviewed appropriate blood pressure monitoring technique and reviewed goal blood pressure - Reviewed to weigh daily and when to contact cardiology with weight gain - Recommend to continue current regimen Current medications:  ACEi/ARB/ARNI: Entresto  49-51 mg BID SGLT2i:  Jardiance  10 mg daily Beta blocker: carvedilol  6.25 mg BID Mineralocorticoid Receptor Antagonist: spironolactone  25 mg daily Diuretic regimen: torsemide  20 mg daily PRN with Kcl 20 mEq PRN    Requested refills of maintenance medications from pharmacy for patient. Provided her with phone number to schedule eye appt with Dr. Patrcia.    Follow Up Plan: PCP 08/13/23, Pharmacist telephone 09/04/23   Lorain Baseman, PharmD [facility] [phone]

## 2024-07-02 ENCOUNTER — Ambulatory Visit: Admitting: Physical Therapy

## 2024-07-02 ENCOUNTER — Other Ambulatory Visit: Payer: Self-pay

## 2024-07-03 ENCOUNTER — Other Ambulatory Visit: Payer: Self-pay

## 2024-07-03 ENCOUNTER — Other Ambulatory Visit: Payer: Self-pay | Admitting: Nurse Practitioner

## 2024-07-03 ENCOUNTER — Ambulatory Visit

## 2024-07-03 DIAGNOSIS — E1165 Type 2 diabetes mellitus with hyperglycemia: Secondary | ICD-10-CM

## 2024-07-03 MED ORDER — TRULICITY 3 MG/0.5ML ~~LOC~~ SOAJ
3.0000 mg | SUBCUTANEOUS | 3 refills | Status: AC
  Filled 2024-07-03: qty 6, 84d supply, fill #0

## 2024-07-04 ENCOUNTER — Ambulatory Visit: Attending: Nurse Practitioner | Admitting: Physical Therapy

## 2024-07-04 ENCOUNTER — Telehealth (HOSPITAL_COMMUNITY): Payer: Self-pay | Admitting: Cardiology

## 2024-07-04 DIAGNOSIS — M79671 Pain in right foot: Secondary | ICD-10-CM

## 2024-07-04 DIAGNOSIS — M722 Plantar fascial fibromatosis: Secondary | ICD-10-CM

## 2024-07-04 DIAGNOSIS — M79672 Pain in left foot: Secondary | ICD-10-CM | POA: Diagnosis present

## 2024-07-04 NOTE — Telephone Encounter (Signed)
 Patient called to request cardiac mri results

## 2024-07-04 NOTE — Therapy (Signed)
 OUTPATIENT PHYSICAL THERAPY LOWER EXTREMITY TREATMENT/RECERT   Patient Name: Maria Duncan MRN: [id number] DOB:[birth year], 58 y.o., female Today's Date: 07/04/2024  END OF SESSION:  PT End of Session - 07/04/24 1451     Visit Number 14    Number of Visits 27    Date for Recertification  06/18/24    Authorization Type  Medicaid    PT Start Time 1451    PT Stop Time 1530    PT Time Calculation (min) 39 min           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) (HCC)    Cataract    not a surgical candidate at this time (02/03/2023)   Chest pain of uncertain etiology, non obstructive CAD, pain due to acute HF 12/12/2019   CHF (congestive heart failure) (HCC)    Diabetes mellitus without complication (HCC)    Diabetic peripheral neuropathy (HCC)    bilateral feet   Heart murmur    childhood   Hyperlipidemia    on meds   Hypertension    on meds   Hypoventilation associated with obesity (HCC) 12/12/2019   Morbid obesity (HCC)    NICM (nonischemic cardiomyopathy) (HCC)    OSA (obstructive sleep apnea) 12/12/2019   uses CPAP   Pulmonary hypertension, unspecified (HCC) 12/12/2019   Past Surgical History:  Procedure Laterality Date   BREAST BIOPSY Left    per pt benign   CESAREAN SECTION  1986   PARTIAL HYSTERECTOMY     RIGHT/LEFT HEART CATH AND CORONARY ANGIOGRAPHY N/A 12/09/2019   Procedure: RIGHT/LEFT HEART CATH AND CORONARY ANGIOGRAPHY;  Surgeon: Cherrie Toribio SAUNDERS, MD;  Location: [facility];  Service: Cardiovascular;  Laterality: N/A;   UTERINE FIBROID SURGERY     WISDOM TOOTH EXTRACTION     Patient Active Problem List   Diagnosis Date Noted   Chest pain 12/14/2023   Colon cancer screening 12/14/2022   Chronic respiratory failure with hypoxia (HCC) 09/21/2022   Type 2 diabetes mellitus with hyperglycemia (HCC) 03/25/2022   Vitamin D  deficiency 08/27/2020   Hyperglycemia 08/07/2020   Pneumonia  due to COVID-19 virus 07/28/2020   Acute hypoxemic respiratory failure due to COVID-19 (HCC) 07/27/2020   Combined congestive systolic and diastolic heart failure (HCC) 07/27/2020   Acute on chronic combined systolic and diastolic CHF (congestive heart failure) (HCC) 01/24/2020   Chest pain of uncertain etiology, non obstructive CAD, pain due to acute HF 12/12/2019   Pulmonary hypertension, unspecified (HCC) 12/12/2019   OSA (obstructive sleep apnea) 12/12/2019   Hypoventilation associated with obesity (HCC) 12/12/2019   CAD in native artery non obstructive on cath 12/09/19 12/12/2019   NICM (nonischemic cardiomyopathy) (HCC)    Acute combined systolic and diastolic HF (heart failure) (HCC)    Morbid obesity (HCC)    CHF exacerbation (HCC) 12/07/2019   Cardiomegaly 12/07/2019   Essential hypertension 12/07/2019   CHF (congestive heart failure) (HCC) 12/07/2019    PCP: Bascom Borer  REFERRING PROVIDER: Donnice Fees  REFERRING DIAG:  [phone] (ICD-10-CM) - Posterior tibial tendon dysfunction (PTTD) of left lower extremity    THERAPY DIAG:  Bilateral foot pain  Plantar fasciitis, bilateral  Rationale for Evaluation and Treatment: Rehabilitation  ONSET DATE: a couple years  SUBJECTIVE:   SUBJECTIVE STATEMENT: pt arrives 6 min late and has not been seen in 3 weeks. Feet- same old. Dr Magdalen 2 nd Thursday in Jan. Having eye issues. Bought a floor  exercise bike. I ex at home I really need your hands working my feet.    PERTINENT HISTORY: See above  PAIN:  Are you having pain? Yes: NPRS scale: 9/10 Pain location: both feet, ankles, back of calf sometimes Pain description: throbbing, needle like pinching in the ankle, constant  Aggravating factors: standing for long periods, walking at the park, stairs, shopping Relieving factors: soaking helps for a couple of hours, massage also helps for about 3 hrs    PRECAUTIONS: None  RED FLAGS: None   WEIGHT BEARING  RESTRICTIONS: No  FALLS:  Has patient fallen in last 6 months? 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  DIAGNOSTIC FINDINGS: X-rays were clear   COGNITION: Overall cognitive status: Within functional limits for tasks assessed     SENSATION: WFL   MUSCLE LENGTH: Tightness in HS and calves  POSTURE: rounded shoulders  PALPATION: There is a decreased medial arch upon weightbearing bilaterally. Majority tenderness is localized along the course of the posterior tibial, flexor tendons with the right side worse than left.   Not able to appreciate any area pinpoint tenderness. Ankle, subtalar joint range of motion intact.   LOWER EXTREMITY ROM: All WFL but painful with all motions in ankles    LOWER EXTREMITY MMT: 5/5 BLE   FUNCTIONAL TESTS:  5 times sit to stand: 17.41s Timed up and go (TUG): 13.94s  GAIT: Distance walked: in clinic distances Assistive device utilized: None Level of assistance: Modified independence Comments: antalgic gait, walks with a limp due to pain in feet                                                                                                                                TREATMENT DATE:  07/04/24 Joint mobs toes, mid foot and ankle Passive stretch ankle, foot toes STM to above with deep stripping of plantar fascia that is tightness in mid arch  06/13/24 Joint mobs toes, mid foot and ankle Passive stretch ankle, foot toes STM to above with deep stripping of plantar fascia that is very rigid and tight Luko tape PF  support, post tib and mid foot support    06/06/24 Joint mobs toes, mid foot and ankle Passive stretch ankle, foot toes STM to above with deep stripping of plantar fascia that is very rigid and tight Luko tape PF support, post tib and mid foot support   05/28/24 Nustep level 4 x 5 minutes Slant board stretch Seated dyna disc all motions Joint mobs toes, mid foot  and ankle Passive stretch ankle, foot toes STM to above Ktape, PF support, post tib and mid foot support  05/23/24 STM BIL PF, Plantar Fascia and Peroneals Calf Stretching Joint Mobilizations to ankle, mid foot, and phalanges PROM and stretching BIL LE KT tape to BIL PF BIL DF Act 8   05/06/24 [place] BIL Feet STM BIL PF, Plantar Fascia and Peroneals Calf Stretching Joint Mobilizations to ankle, mid foot, and phalanges  04/18/24 BIL DF 15 [place] BIL feet STM to the PF bilaterally Joint mobs ankle mid foot and phalanges Calf stretching   04/16/24 [place] BIL feet STM to the PF bilaterally Joint mobs ankle mid foot and phalanges Calf stretching  04/11/24 L 4 Rocker board  Dyna disc standing WBAT Black bar DF and PF STM to the PF bilaterally Joint mobs mid foot and phalanges Ktape 3 I strips, PF, posterior tib and arch    04/09/24 Nustep level 4 x 6 minutes Slant board calf stretch On airex standing and weight shifts On airex cone toe touches Reviewed HEP STM to the PF bilaterally Joint mobs mid foot and phalanges Ktape 3 I strips, PF, posterior tib and arch   04/04/24 STM ( with and without instrument assist)to the bilateral feet, passive stretch, joint mobs of the mid foot and ankle Ktape one piece to support PF, one piece to support posterior tibialis and then one to support arch  04/02/24 STM to the bilateral feet, passive stretch, joint mobs of the mid foot and ankle Ktape one piece to support PF, one piece to support posterior tibialis and then one to support arch    03/29/24 Nustep level 4 x 4 minutes Slant board stretch Discussion on orthotics and where to go and what to look for Reviewed HEP STM to the bilateral feet, passive stretch, joint mobs of the mid foot Ktape one piece to support PF, one piece to support posterior tibialis and then one to support arch   03/26/24- EVAL, HEP    PATIENT EDUCATION:  Education details: POC, HEP, foot anatomy, pain relief tools such as massage, stretching, etc Person educated: Patient and Spouse Education method: Explanation and Demonstration Education comprehension: verbalized understanding, returned demonstration, and needs further education  HOME EXERCISE PROGRAM: Access Code: [facility] URL: [email or web address] Date: 03/26/2024 Prepared by: Almetta Fam  Exercises - Gastroc Stretch on Wall  - 1 x daily - 7 x weekly - 2 reps - 30 hold - Heel Raises with Counter Support  - 1 x daily - 7 x weekly - 2 sets - 10 reps - Long Sitting Calf Stretch with Strap  - 1 x daily - 7 x weekly - 2 reps - 30 hold - Standing Plantar Fascia Mobilization with Small Ball  - 1 x daily - 7 x weekly - Towel Scrunches  - 1 x daily - 7 x weekly - 3 sets - 10 reps - Seated Plantar Fascia Stretch  - 1 x daily - 7 x weekly - 2 reps - 30 hold  ASSESSMENT:  CLINICAL IMPRESSION: pt arrives 6 min late and has not been seen in 3 weeks. Feet- same old. Dr Magdalen 2 nd Thursday in Jan. Having eye issues. Bought a floor exercise bike. I ex at home I really need your hands working my feet. No changes overall, temporary relief only. Less tightness and tenderness noted with STM  OBJECTIVE IMPAIRMENTS: Abnormal gait, decreased activity tolerance, decreased balance, difficulty walking, increased fascial restrictions, impaired flexibility, and pain.   ACTIVITY LIMITATIONS: bending,  squatting, stairs, and locomotion level  PARTICIPATION LIMITATIONS: cleaning, laundry, driving, shopping, community activity, occupation, and yard  work  PERSONAL FACTORS: Age, Past/current experiences, and Time since onset of injury/illness/exacerbation are also affecting patient's functional outcome.   REHAB POTENTIAL: Good  CLINICAL DECISION MAKING: Stable/uncomplicated  EVALUATION COMPLEXITY: Low   GOALS: Goals reviewed with patient? Yes  SHORT TERM GOALS: Target date: 05/07/24  Patient will be independent with initial HEP. Baseline:  Goal status: 04/02/24 MET  2.  Patient will report at least 50% improvement in bilateral ankle/foot pain to improve QOL. (5/10) Baseline: 10/10  Goal status: ongoing 04/09/24  and 04/18/24  and 06/06/24  and 06/13/24    LONG TERM GOALS: Target date: 08/02/24  Patient will be independent with advanced/ongoing HEP to improve outcomes and carryover.  Baseline:  Goal status: evolving 05/23/24  and 06/06/24 07/04/24 MET  2.  Patient will report at least 75% improvement in bilateral ankle/foot pain to improve QOL. (2/10) Baseline: 10/10 at eval; 6/10 end of session 10/13 Goal status: progressing 05/23/24  no changes 07/04/24  3.  Patient will be able to ambulate 600' with normal gait pattern without increased foot/ankle pain to access community.  Baseline: antalgic gait; progressing 10/13 (still antalgic) Goal status: 05/23/24 antalgic  and 06/06/24 On going 07/04/24  4. Patient will be able to ascend/descend stairs with 1 HR and reciprocal step pattern safely to access home and community.  Baseline: step to with rails Goal status: INITIAL  5.  Patient will be able to walk, dance, and do a light jog without pain. Baseline: stopped doing all of these d/t pain Goal status: on going 04/18/24 tried to dance but added to set back this week   PLAN:  PT FREQUENCY: 2x/week  PT DURATION: 12 weeks  PLANNED INTERVENTIONS: 97110-Therapeutic exercises, 97530- Therapeutic activity, 97112- Neuromuscular re-education, 97535- Self Care, 02859- Manual therapy, [id number]- Gait training, [id number]- Electrical  stimulation (unattended), [id number]- Ultrasound, [id number]- Ionotophoresis 4mg /ml Dexamethasone , 79439 (1-2 muscles), 20561 (3+ muscles)- Dry Needling, Patient/Family education, Balance training, Stair training, Taping, Joint mobilization, Cryotherapy, and Moist heat  PLAN FOR NEXT SESSION: MD f/u ,mid Jan  Mona Sajjad, PT, DPT [place], PTA, 07/04/2024, 2:51 PM

## 2024-07-04 NOTE — Telephone Encounter (Signed)
 Cardiac mri from 11/25 showed normal right and left sided heart squeezing function. No worrisome abnomralities.

## 2024-07-05 NOTE — Telephone Encounter (Signed)
 Spoke with patient regarding the following results. Patient made aware and patient verbalized understanding.

## 2024-07-09 ENCOUNTER — Ambulatory Visit: Admitting: Physical Therapy

## 2024-07-11 ENCOUNTER — Ambulatory Visit: Admitting: Physical Therapy

## 2024-07-11 ENCOUNTER — Encounter (HOSPITAL_COMMUNITY): Payer: Self-pay | Admitting: *Deleted

## 2024-07-11 DIAGNOSIS — M79671 Pain in right foot: Secondary | ICD-10-CM

## 2024-07-11 DIAGNOSIS — M722 Plantar fascial fibromatosis: Secondary | ICD-10-CM

## 2024-07-11 NOTE — Addendum Note (Signed)
 Addended by: JANIT FORMICA on: 07/11/2024 09:39 AM   Modules accepted: Orders

## 2024-07-11 NOTE — Therapy (Signed)
 OUTPATIENT PHYSICAL THERAPY LOWER EXTREMITY TREATMENT   Patient Name: Maria Duncan MRN: [id number] DOB:[birth year], 58 y.o., female Today's Date: 07/11/2024  END OF SESSION:  PT End of Session - 07/11/24 0842     Visit Number 15    Date for Recertification  08/02/24    Authorization Type [place] [employer]    PT Start Time [phone]    PT Stop Time 0925    PT Time Calculation (min) 33 min           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) (HCC)    Cataract    not a surgical candidate at this time (02/03/2023)   Chest pain of uncertain etiology, non obstructive CAD, pain due to acute HF 12/12/2019   CHF (congestive heart failure) (HCC)    Diabetes mellitus without complication (HCC)    Diabetic peripheral neuropathy (HCC)    bilateral feet   Heart murmur    childhood   Hyperlipidemia    on meds   Hypertension    on meds   Hypoventilation associated with obesity (HCC) 12/12/2019   Morbid obesity (HCC)    NICM (nonischemic cardiomyopathy) (HCC)    OSA (obstructive sleep apnea) 12/12/2019   uses CPAP   Pulmonary hypertension, unspecified (HCC) 12/12/2019   Past Surgical History:  Procedure Laterality Date   BREAST BIOPSY Left    per pt benign   CESAREAN SECTION  1986   PARTIAL HYSTERECTOMY     RIGHT/LEFT HEART CATH AND CORONARY ANGIOGRAPHY N/A 12/09/2019   Procedure: RIGHT/LEFT HEART CATH AND CORONARY ANGIOGRAPHY;  Surgeon: Cherrie Toribio SAUNDERS, MD;  Location: [facility];  Service: Cardiovascular;  Laterality: N/A;   UTERINE FIBROID SURGERY     WISDOM TOOTH EXTRACTION     Patient Active Problem List   Diagnosis Date Noted   Chest pain 12/14/2023   Colon cancer screening 12/14/2022   Chronic respiratory failure with hypoxia (HCC) 09/21/2022   Type 2 diabetes mellitus with hyperglycemia (HCC) 03/25/2022   Vitamin D  deficiency 08/27/2020   Hyperglycemia 08/07/2020   Pneumonia due to COVID-19 virus 07/28/2020    Acute hypoxemic respiratory failure due to COVID-19 (HCC) 07/27/2020   Combined congestive systolic and diastolic heart failure (HCC) 07/27/2020   Acute on chronic combined systolic and diastolic CHF (congestive heart failure) (HCC) 01/24/2020   Chest pain of uncertain etiology, non obstructive CAD, pain due to acute HF 12/12/2019   Pulmonary hypertension, unspecified (HCC) 12/12/2019   OSA (obstructive sleep apnea) 12/12/2019   Hypoventilation associated with obesity (HCC) 12/12/2019   CAD in native artery non obstructive on cath 12/09/19 12/12/2019   NICM (nonischemic cardiomyopathy) (HCC)    Acute combined systolic and diastolic HF (heart failure) (HCC)    Morbid obesity (HCC)    CHF exacerbation (HCC) 12/07/2019   Cardiomegaly 12/07/2019   Essential hypertension 12/07/2019   CHF (congestive heart failure) (HCC) 12/07/2019    PCP: Bascom Borer  REFERRING PROVIDER: Donnice Fees  REFERRING DIAG:  [phone] (ICD-10-CM) - Posterior tibial tendon dysfunction (PTTD) of left lower extremity    THERAPY DIAG:  Bilateral foot pain  Plantar fasciitis, bilateral  Rationale for Evaluation and Treatment: Rehabilitation  ONSET DATE: a couple years  SUBJECTIVE:   SUBJECTIVE STATEMENT: pt arrives 7 min late . Its bad and burning   PERTINENT HISTORY: See above  PAIN:  Are you having pain? Yes: NPRS scale: 9/10 Pain location: both feet, ankles, back of calf sometimes  Pain description: throbbing, needle like pinching in the ankle, constant  Aggravating factors: standing for long periods, walking at the park, stairs, shopping Relieving factors: soaking helps for a couple of hours, massage also helps for about 3 hrs    PRECAUTIONS: None  RED FLAGS: None   WEIGHT BEARING RESTRICTIONS: No  FALLS:  Has patient fallen in last 6 months? 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  DIAGNOSTIC FINDINGS: X-rays were clear   COGNITION: Overall cognitive status: Within functional limits for tasks assessed     SENSATION: WFL   MUSCLE LENGTH: Tightness in HS and calves  POSTURE: rounded shoulders  PALPATION: There is a decreased medial arch upon weightbearing bilaterally. Majority tenderness is localized along the course of the posterior tibial, flexor tendons with the right side worse than left.   Not able to appreciate any area pinpoint tenderness. Ankle, subtalar joint range of motion intact.   LOWER EXTREMITY ROM: All WFL but painful with all motions in ankles    LOWER EXTREMITY MMT: 5/5 BLE   FUNCTIONAL TESTS:  5 times sit to stand: 17.41s Timed up and go (TUG): 13.94s  GAIT: Distance walked: in clinic distances Assistive device utilized: None Level of assistance: Modified independence Comments: antalgic gait, walks with a limp due to pain in feet                                                                                                                                TREATMENT DATE:  07/11/24 Joint mobs toes, mid foot and ankle Passive stretch ankle, foot toes STM to above with deep stripping of plantar fascia that is tightness in mid arch   07/04/24 Joint mobs toes, mid foot and ankle Passive stretch ankle, foot toes STM to above with deep stripping of plantar fascia that is tightness in mid arch  06/13/24 Joint mobs toes, mid foot and ankle Passive stretch ankle, foot toes STM to above with deep stripping of plantar fascia that is very rigid and tight Luko tape PF support, post tib and mid foot  support    06/06/24 Joint mobs toes, mid foot and ankle Passive stretch ankle, foot toes STM to above with deep stripping of plantar fascia that is very rigid and tight Luko tape PF support, post tib and mid foot support   05/28/24 Nustep level 4 x 5 minutes Slant board stretch Seated dyna disc all motions Joint mobs toes, mid foot and ankle Passive stretch ankle, foot toes STM to above [place],  PF support, post tib and mid foot support  05/23/24 STM BIL PF, Plantar Fascia and Peroneals Calf Stretching Joint Mobilizations to ankle, mid foot, and phalanges PROM and stretching BIL LE KT tape to BIL PF BIL DF Act 8   05/06/24 [place] BIL Feet STM BIL PF, Plantar Fascia and Peroneals Calf Stretching Joint Mobilizations to ankle, mid foot, and phalanges  04/18/24 BIL DF 15 [place] BIL feet STM to the PF bilaterally Joint mobs ankle mid foot and phalanges Calf stretching   04/16/24 [place] BIL feet STM to the PF bilaterally Joint mobs ankle mid foot and phalanges Calf stretching  04/11/24 L 4 Rocker board  Dyna disc standing WBAT Black bar DF and PF STM to the PF bilaterally Joint mobs mid foot and phalanges Ktape 3 I strips, PF, posterior tib and arch    04/09/24 Nustep level 4 x 6 minutes Slant board calf stretch On airex standing and weight shifts On airex cone toe touches Reviewed HEP STM to the PF bilaterally Joint mobs mid foot and phalanges Ktape 3 I strips, PF, posterior tib and arch   04/04/24 STM ( with and without instrument assist)to the bilateral feet, passive stretch, joint mobs of the mid foot and ankle Ktape one piece to support PF, one piece to support posterior tibialis and then one to support arch  04/02/24 STM to the bilateral feet, passive stretch, joint mobs of the mid foot and ankle Ktape one piece to support PF, one piece to support posterior tibialis and then one to support arch   03/29/24 Nustep level 4 x 4 minutes Slant  board stretch Discussion on orthotics and where to go and what to look for Reviewed HEP STM to the bilateral feet, passive stretch, joint mobs of the mid foot Ktape one piece to support PF, one piece to support posterior tibialis and then one to support arch   03/26/24- EVAL, HEP    PATIENT EDUCATION:  Education details: POC, HEP, foot anatomy, pain relief tools such as massage, stretching, etc Person educated: Patient and Spouse Education method: Explanation and Demonstration Education comprehension: verbalized understanding, returned demonstration, and needs further education  HOME EXERCISE PROGRAM: Access Code: [employer] URL: [email or web address] Date: 03/26/2024 Prepared by: Almetta Fam  Exercises - Gastroc Stretch on Wall  - 1 x daily - 7 x weekly - 2 reps - 30 hold - Heel Raises with Counter Support  - 1 x daily - 7 x weekly - 2 sets - 10 reps - Long Sitting Calf Stretch with Strap  - 1 x daily - 7 x weekly - 2 reps - 30 hold - Standing Plantar Fascia Mobilization with Small Ball  - 1 x daily - 7 x weekly - Towel Scrunches  - 1 x daily - 7 x weekly - 3 sets - 10 reps - Seated Plantar Fascia Stretch  - 1 x daily - 7 x weekly - 2 reps - 30 hold  ASSESSMENT:  CLINICAL IMPRESSION: pt arrives 7 min late and cancelled earlier in week. No lasting changes just temporary relief. No changes or progress with goals. Will hold PT d/t lack of progress. OBJECTIVE IMPAIRMENTS: Abnormal gait, decreased activity tolerance, decreased balance, difficulty walking, increased fascial restrictions, impaired flexibility, and pain.   ACTIVITY LIMITATIONS: bending, squatting, stairs, and locomotion level  PARTICIPATION LIMITATIONS: cleaning, laundry, driving, shopping, community activity, occupation, and yard work  PERSONAL FACTORS: Age, Past/current experiences, and Time since onset of injury/illness/exacerbation are also affecting patient's functional outcome.   REHAB  POTENTIAL:  Good  CLINICAL DECISION MAKING: Stable/uncomplicated  EVALUATION COMPLEXITY: Low   GOALS: Goals reviewed with patient? Yes  SHORT TERM GOALS: Target date: 05/07/24  Patient will be independent with initial HEP. Baseline:  Goal status: 04/02/24 MET  2.  Patient will report at least 50% improvement in bilateral ankle/foot pain to improve QOL. (5/10) Baseline: 10/10  Goal status: ongoing 04/09/24  and 04/18/24  and 06/06/24  and 06/13/24. On going 07/11/24    LONG TERM GOALS: Target date: 06/18/24  Patient will be independent with advanced/ongoing HEP to improve outcomes and carryover.  Baseline:  Goal status: evolving 05/23/24  and 06/06/24 07/04/24 MET  2.  Patient will report at least 75% improvement in bilateral ankle/foot pain to improve QOL. (2/10) Baseline: 10/10 at eval; 6/10 end of session 10/13 Goal status: progressing 05/23/24  no changes 07/04/24  and 07/11/24  3.  Patient will be able to ambulate 600' with normal gait pattern without increased foot/ankle pain to access community.  Baseline: antalgic gait; progressing 10/13 (still antalgic) Goal status: 05/23/24 antalgic  and 06/06/24 On going 07/04/24 and 07/11/24  4. Patient will be able to ascend/descend stairs with 1 HR and reciprocal step pattern safely to access home and community.  Baseline: step to with rails Goal status: on going 07/11/24  5.  Patient will be able to walk, dance, and do a light jog without pain. Baseline: stopped doing all of these d/t pain Goal status: on going 04/18/24 tried to dance but added to set back this week . On going 07/11/24   PLAN:  PT FREQUENCY: 2x/week  PT DURATION: 12 weeks  PLANNED INTERVENTIONS: 97110-Therapeutic exercises, 97530- Therapeutic activity, 97112- Neuromuscular re-education, 97535- Self Care, 02859- Manual therapy, [id number]- Gait training, [phone]- Electrical stimulation (unattended), 97035- Ultrasound, 02966- Ionotophoresis 4mg /ml Dexamethasone , 79439 (1-2  muscles), 20561 (3+ muscles)- Dry Needling, Patient/Family education, Balance training, Stair training, Taping, Joint mobilization, Cryotherapy, and Moist heat  PLAN FOR NEXT SESSION: HOLD PT d/t lack of progress until sees MD in [place], PTA, 07/11/2024, 9:18 AM

## 2024-07-29 ENCOUNTER — Telehealth: Payer: Self-pay | Admitting: Gastroenterology

## 2024-07-29 ENCOUNTER — Ambulatory Visit: Admitting: Gastroenterology

## 2024-07-29 NOTE — Telephone Encounter (Signed)
 Pt rescheduled for 08/13/24 with Jessica Zehr

## 2024-07-29 NOTE — Progress Notes (Deleted)
 "  Maria Duncan [id number] 09/26/1965   Chief Complaint:  Referring Provider: Oley Bascom RAMAN, NP Primary GI MD:   HPI: Maria Duncan is a 59 y.o. female with past medical history of CHF, CAD, diabetes, HLD, HTN, obesity, OSA, pulmonary hypertension, partial hysterectomy who presents today to discuss colonoscopy.      Discussed the use of AI scribe software for clinical note transcription with the patient, who gave verbal consent to proceed.  History of Present Illness       Previous GI Procedures/Imaging   RUQ US  05/16/2024 IMPRESSION: No significant sonographic abnormality of the liver or gallbladder.   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) (HCC)    Cataract    not a surgical candidate at this time (02/03/2023)   Chest pain of uncertain etiology, non obstructive CAD, pain due to acute HF 12/12/2019   CHF (congestive heart failure) (HCC)    Diabetes mellitus without complication (HCC)    Diabetic peripheral neuropathy (HCC)    bilateral feet   Heart murmur    childhood   Hyperlipidemia    on meds   Hypertension    on meds   Hypoventilation associated with obesity (HCC) 12/12/2019   Morbid obesity (HCC)    NICM (nonischemic cardiomyopathy) (HCC)    OSA (obstructive sleep apnea) 12/12/2019   uses CPAP   Pulmonary hypertension, unspecified (HCC) 12/12/2019    Past Surgical History:  Procedure Laterality Date   BREAST BIOPSY Left    per pt benign   CESAREAN SECTION  1986   PARTIAL HYSTERECTOMY     RIGHT/LEFT HEART CATH AND CORONARY ANGIOGRAPHY N/A 12/09/2019   Procedure: RIGHT/LEFT HEART CATH AND CORONARY ANGIOGRAPHY;  Surgeon: Cherrie Toribio SAUNDERS, MD;  Location: [facility];  Service: Cardiovascular;  Laterality: N/A;   UTERINE FIBROID SURGERY     WISDOM TOOTH EXTRACTION      Current Outpatient Medications  Medication Sig Dispense Refill   aspirin  81 MG EC tablet Take 1 tablet (81 mg  total) by mouth daily. 90 tablet 3   atorvastatin  (LIPITOR) 80 MG tablet Take 1 tablet (80 mg total) by mouth daily. 90 tablet 3   azelastine  (ASTELIN ) 0.1 % nasal spray Place 1 spray into both nostrils at bedtime as needed for rhinitis. 90 mL 3   carvedilol  (COREG ) 6.25 MG tablet Take 1 tablet (6.25 mg total) by mouth 2 (two) times daily. 60 tablet 3   ciclopirox  (PENLAC ) 8 % solution Apply topically at bedtime. Apply over nail and surrounding skin. Apply daily over previous coat. After seven (7) days, may remove with alcohol and continue cycle. 6.6 mL 2   diclofenac  (VOLTAREN ) 75 MG EC tablet Take 1 tablet (75 mg total) by mouth 2 (two) times daily. 50 tablet 2   docusate sodium  (COLACE) 100 MG capsule Take 1 capsule (100 mg total) by mouth 2 (two) times daily. 10 capsule 0   Dulaglutide  (TRULICITY ) 3 MG/0.5ML SOAJ Inject 3 mg into the skin once a week. 6 mL 3   Elastic Bandages & Supports (WRIST SPLINT/COCK-UP/LEFT M) MISC Wear nightly 1 each 0   Elastic Bandages & Supports (WRIST SPLINT/COCK-UP/RIGHT M) MISC Wear nightly 1 each 0   empagliflozin  (JARDIANCE ) 10 MG TABS tablet Take 1 tablet (10 mg total) by mouth daily before breakfast. 90 tablet 3   fluticasone  (FLONASE ) 50 MCG/ACT nasal spray Place 2 sprays into both nostrils daily as needed for allergies or  rhinitis. 48 g 3   gabapentin  (NEURONTIN ) 100 MG capsule Take 1 capsule (100 mg total) by mouth 3 (three) times daily. 90 capsule 3   glucose blood (TRUE METRIX BLOOD GLUCOSE TEST) test strip Use as instructed 100 each 12   latanoprost  (XALATAN ) 0.005 % ophthalmic solution Place 1 drop into both eyes at bedtime. 10 mL 3   loratadine  (CLARITIN ) 10 MG tablet Take 1 tablet (10 mg total) by mouth daily as needed for allergies. 90 tablet 3   Multiple Vitamins-Minerals (ZINC  PO) Take 1 tablet by mouth daily at 6 (six) AM.     omeprazole  (PRILOSEC) 20 MG capsule Take 1 capsule (20 mg total) by mouth daily. (Patient not taking: Reported on  07/01/2024) 30 capsule 3   polyethylene glycol (MIRALAX  / GLYCOLAX ) 17 g packet Take 17 g by mouth daily as needed (constipation). 14 each 0   potassium chloride  SA (KLOR-CON  M) 20 MEQ tablet Take 1 tablet (20 mEq total) by mouth daily as needed. Take 1 tablet (20meq) with torsemide  if needed 30 tablet 3   sacubitril -valsartan  (ENTRESTO ) 49-51 MG Take 1 tablet by mouth 2 (two) times daily. 180 tablet 3   spironolactone  (ALDACTONE ) 25 MG tablet Take 1 tablet (25 mg total) by mouth daily. NEEDS FOLLOW UP FOR ANYMORE REFILLS 30 tablet 5   torsemide  (DEMADEX ) 20 MG tablet Take 1 tablet (20 mg total) by mouth daily as needed. Take 1 tablet (20mg ) daily as needed- please take 1 tablet (20meq) of potassium with this if needed 30 tablet 3   TRUEplus Lancets 28G MISC Use as directed in the morning and at bedtime. 100 each 1   Vitamin D -Vitamin K (K2 PLUS D3 PO) Take 1 tablet by mouth daily.     No current facility-administered medications for this visit.    Allergies as of 07/29/2024 - Review Complete 07/01/2024  Allergen Reaction Noted   Hyzaar [losartan potassium-hctz] Nausea And Vomiting and Cough 03/17/2013    Family History  Problem Relation Age of Onset   Colon polyps Mother 56   Breast cancer Mother 57   Hyperlipidemia Mother    Diabetes Father    Hypertension Father    Hyperlipidemia Sister    Hypertension Sister    Hyperlipidemia Brother    Hypertension Brother    Cancer Maternal Grandmother    Stroke Maternal Grandmother    Esophageal cancer Maternal Grandfather 64   Colon cancer Maternal Grandfather 72   Colon polyps Maternal Grandfather 72   Cancer Maternal Grandfather    Diabetes Maternal Grandfather    Heart disease Maternal Grandfather    Hypertension Maternal Grandfather    Stomach cancer Paternal Grandmother 40   Diabetes Paternal Grandmother    Hypertension Paternal Grandmother    Cancer Paternal Grandfather    Hypertension Paternal Grandfather    Stroke Paternal  Grandfather    Rectal cancer Neg Hx     Social History[1]   Review of Systems:    Constitutional: No weight loss, fever, chills, weakness or fatigue Eyes: No change in vision Ears, Nose, Throat:  No change in hearing or congestion Skin: No rash or itching Cardiovascular: No chest pain, chest pressure or palpitations   Respiratory: No SOB or cough Gastrointestinal: See HPI and otherwise negative Genitourinary: No dysuria or change in urinary frequency Neurological: No headache, dizziness or syncope Musculoskeletal: No new muscle or joint pain Hematologic: No bleeding or bruising    Physical Exam:  Vital signs: There were no vitals taken for this  visit.  Wt Readings from Last 3 Encounters:  05/20/24 228 lb (103.4 kg)  05/13/24 223 lb (101.2 kg)  05/03/24 222 lb 12.8 oz (101.1 kg)     Constitutional: NAD, Well developed, Well nourished, alert and cooperative Head:  Normocephalic and atraumatic.  Eyes: No scleral icterus. Conjunctiva pink. Mouth: No oral lesions. Respiratory: Respirations even and unlabored. Lungs clear to auscultation bilaterally.  No wheezes, crackles, or rhonchi.  Cardiovascular:  Regular rate and rhythm. No murmurs. No peripheral edema. Gastrointestinal:  Soft, nondistended, nontender. No rebound or guarding. Normal bowel sounds. No appreciable masses or hepatomegaly. Rectal:  Not performed.  Neurologic:  Alert and oriented x4;  grossly normal neurologically.  Skin:   Dry and intact without significant lesions or rashes. Psychiatric: Oriented to person, place and time. Demonstrates good judgement and reason without abnormal affect or behaviors.   Echocardiogram 02/02/2024 1. Somewhat unusuual morphology with spade like ventrical in some views and near mid cavitary obstruction from papillary muscles. This combinded with apical hypokinesis and abnormal strain suggest f/ u cardiac MRI to r/ o form of hypertrophic cardiomyopathy . Left ventricular ejection  fraction, by estimation, is 50% . The left ventricle has mildly decreased function. The left ventricle has no regional wall motion abnormalities. Left ventricular diastolic parameters are consistent with Grade I diastolic dysfunction ( impaired relaxation) . The average left ventricular global longitudinal strain is - 13. 5 % . The global longitudinal strain is abnormal.  2. Right ventricular systolic function is normal. The right ventricular size is normal.  3. Left atrial size was moderately dilated.  4. The mitral valve is abnormal. Trivial mitral valve regurgitation. No evidence of mitral stenosis.  5. The aortic valve is tricuspid. There is mild calcification of the aortic valve. There is mild thickening of the aortic valve. Aortic valve regurgitation is not visualized. Aortic valve sclerosis is present, with no evidence of aortic valve stenosis.  6. The inferior vena cava is normal in size with greater than 50% respiratory variability, suggesting right atrial pressure of 3 mmHg.     Assessment/Plan:   Assessment & Plan        Camie Furbish, PA-C [facility] 07/29/2024, 8:17 AM  Patient Care Team: Oley Bascom RAMAN, NP as PCP - General (Nurse Practitioner) Nahser, Aleene PARAS, MD (Inactive) as PCP - Cardiology (Cardiology) Shlomo Wilbert SAUNDERS, MD as PCP - Sleep Medicine (Cardiology) Skeet Juliene SAUNDERS, DO as Consulting Physician (Neurology)      [1]  Social History Tobacco Use   Smoking status: Never   Smokeless tobacco: Never  Vaping Use   Vaping status: Never Used  Substance Use Topics   Alcohol use: No   Drug use: No   "

## 2024-07-29 NOTE — Telephone Encounter (Signed)
 Patient called stating she is unable to come in for appointment for today at 9:30 due to having a fever.

## 2024-07-30 ENCOUNTER — Ambulatory Visit

## 2024-08-05 ENCOUNTER — Ambulatory Visit

## 2024-08-05 ENCOUNTER — Ambulatory Visit: Admitting: Podiatry

## 2024-08-12 ENCOUNTER — Ambulatory Visit: Payer: Self-pay | Admitting: Nurse Practitioner

## 2024-08-13 ENCOUNTER — Ambulatory Visit

## 2024-08-13 ENCOUNTER — Ambulatory Visit: Admitting: Gastroenterology

## 2024-08-14 ENCOUNTER — Ambulatory Visit: Admitting: Podiatry

## 2024-08-26 ENCOUNTER — Ambulatory Visit

## 2024-08-29 ENCOUNTER — Ambulatory Visit

## 2024-09-03 ENCOUNTER — Other Ambulatory Visit: Payer: Self-pay

## 2024-09-04 ENCOUNTER — Ambulatory Visit

## 2024-09-10 ENCOUNTER — Ambulatory Visit: Admitting: Gastroenterology

## 2024-09-12 ENCOUNTER — Encounter: Admitting: Nurse Practitioner

## 2024-09-25 ENCOUNTER — Ambulatory Visit: Admitting: Neurology
